# Patient Record
Sex: Male | Born: 1950 | Race: White | Hispanic: No | Marital: Married | Smoking: Former smoker
Health system: Southern US, Community
[De-identification: ages and names within clinical notes are randomized; demographics above are authoritative.]

## PROBLEM LIST (undated history)

## (undated) DIAGNOSIS — Q339 Congenital malformation of lung, unspecified: Secondary | ICD-10-CM

## (undated) DIAGNOSIS — E785 Hyperlipidemia, unspecified: Secondary | ICD-10-CM

## (undated) DIAGNOSIS — C801 Malignant (primary) neoplasm, unspecified: Secondary | ICD-10-CM

## (undated) DIAGNOSIS — M1990 Unspecified osteoarthritis, unspecified site: Secondary | ICD-10-CM

## (undated) DIAGNOSIS — Z8719 Personal history of other diseases of the digestive system: Secondary | ICD-10-CM

## (undated) DIAGNOSIS — N400 Enlarged prostate without lower urinary tract symptoms: Secondary | ICD-10-CM

## (undated) DIAGNOSIS — M2550 Pain in unspecified joint: Secondary | ICD-10-CM

## (undated) DIAGNOSIS — Z860100 Personal history of colon polyps, unspecified: Secondary | ICD-10-CM

## (undated) DIAGNOSIS — K219 Gastro-esophageal reflux disease without esophagitis: Secondary | ICD-10-CM

## (undated) DIAGNOSIS — K3184 Gastroparesis: Secondary | ICD-10-CM

## (undated) DIAGNOSIS — Z8711 Personal history of peptic ulcer disease: Secondary | ICD-10-CM

## (undated) DIAGNOSIS — R350 Frequency of micturition: Secondary | ICD-10-CM

## (undated) HISTORY — DX: Hyperlipidemia, unspecified: E78.5

## (undated) HISTORY — DX: Unspecified osteoarthritis, unspecified site: M19.90

## (undated) HISTORY — DX: Congenital malformation of lung, unspecified: Q33.9

## (undated) HISTORY — PX: CHOLECYSTECTOMY: SHX55

## (undated) HISTORY — DX: Malignant (primary) neoplasm, unspecified: C80.1

## (undated) HISTORY — PX: DIAGNOSTIC LAPAROSCOPY: SUR761

## (undated) HISTORY — PX: OTHER SURGICAL HISTORY: SHX169

## (undated) HISTORY — PX: ESOPHAGOGASTRODUODENOSCOPY: SHX1529

## (undated) HISTORY — DX: Gastroparesis: K31.84

## (undated) HISTORY — PX: COLONOSCOPY: SHX174

## (undated) HISTORY — DX: Gastro-esophageal reflux disease without esophagitis: K21.9

## (undated) HISTORY — DX: Benign prostatic hyperplasia without lower urinary tract symptoms: N40.0

## (undated) SURGERY — THORACENTESIS
Laterality: Right

---

## 2000-09-26 ENCOUNTER — Emergency Department (HOSPITAL_COMMUNITY): Admission: EM | Admit: 2000-09-26 | Discharge: 2000-09-26 | Payer: Self-pay | Admitting: Emergency Medicine

## 2007-04-01 ENCOUNTER — Emergency Department (HOSPITAL_COMMUNITY): Admission: EM | Admit: 2007-04-01 | Discharge: 2007-04-01 | Payer: Self-pay | Admitting: Emergency Medicine

## 2011-03-18 LAB — HM COLONOSCOPY

## 2011-03-23 ENCOUNTER — Other Ambulatory Visit (HOSPITAL_COMMUNITY): Payer: Self-pay | Admitting: Gastroenterology

## 2011-03-23 DIAGNOSIS — R112 Nausea with vomiting, unspecified: Secondary | ICD-10-CM

## 2011-04-06 ENCOUNTER — Encounter (HOSPITAL_COMMUNITY)
Admission: RE | Admit: 2011-04-06 | Discharge: 2011-04-06 | Disposition: A | Discharge status: Home / Self Care | Payer: BC Managed Care – PPO | Source: Ambulatory Visit | Attending: Gastroenterology | Admitting: Gastroenterology

## 2011-04-06 ENCOUNTER — Ambulatory Visit: Admission: RE | Admit: 2011-04-06 | Payer: Self-pay | Source: Ambulatory Visit

## 2011-04-06 DIAGNOSIS — R112 Nausea with vomiting, unspecified: Secondary | ICD-10-CM

## 2011-04-06 MED ORDER — TECHNETIUM TC 99M SULFUR COLLOID
2.0000 | Freq: Once | INTRAVENOUS | Status: AC | PRN
  Administered 2011-04-06: 2 via ORAL

## 2012-11-17 LAB — HM COLONOSCOPY

## 2013-07-16 ENCOUNTER — Emergency Department (HOSPITAL_COMMUNITY)
Admission: EM | Admit: 2013-07-16 | Discharge: 2013-07-16 | Disposition: A | Discharge status: Home / Self Care | Payer: BC Managed Care – PPO | Source: Home / Self Care

## 2013-07-16 ENCOUNTER — Encounter (HOSPITAL_COMMUNITY): Payer: Self-pay | Admitting: Emergency Medicine

## 2013-07-16 DIAGNOSIS — R109 Unspecified abdominal pain: Secondary | ICD-10-CM

## 2013-07-16 DIAGNOSIS — R1110 Vomiting, unspecified: Secondary | ICD-10-CM

## 2013-07-16 MED ORDER — ONDANSETRON HCL 4 MG/2ML IJ SOLN
INTRAMUSCULAR | Status: AC
  Filled 2013-07-16: qty 2

## 2013-07-16 MED ORDER — ONDANSETRON HCL 4 MG/2ML IJ SOLN
4.0000 mg | Freq: Once | INTRAMUSCULAR | Status: AC
  Administered 2013-07-16: 4 mg via INTRAVENOUS

## 2013-07-16 MED ORDER — SODIUM CHLORIDE 0.9 % IV SOLN
Freq: Once | INTRAVENOUS | Status: AC
  Administered 2013-07-16: 11:00:00 via INTRAVENOUS

## 2013-07-16 NOTE — ED Provider Notes (Signed)
CSN: [id number]     Arrival date & time 07/16/13  0910 History   None    Chief Complaint  Patient presents with  . Emesis  . Dehydration   (Consider location/radiation/quality/duration/timing/severity/associated sxs/prior Treatment) HPI Comments: Pt with LUQ abd pain on and off for 2.5 years. Has seen GI for this and had endoscopy and colonscopy, abd x-rays. No source of pain has been found.  approx twice a year in this time period pt also gets episodes of vomiting. Current sx c/w previous episodes.  Pt was seen in ER in [place] on 11/8 for same problem. Papers from ER discharge show pt had cbc with diff, complete metabolic panel, lipase, urinalysis and abdominal x-ray series all of which pt and wife report they were told were normal. Pt states received IV fluids in ER and felt better. Reason for visit today is wants IV fluids again.   Patient is a 62 y.o. male presenting with vomiting. The history is provided by the patient.  Emesis Severity:  Severe Duration:  3 days Timing:  Intermittent Number of daily episodes:  Every 2 hours or more Quality:  Bilious material Progression:  Unchanged Chronicity:  Recurrent Relieved by:  Nothing Worsened by:  Liquids Ineffective treatments:  Antiemetics Associated symptoms: abdominal pain and chills   Associated symptoms: no diarrhea and no fever     History reviewed. No pertinent past medical history. History reviewed. No pertinent past surgical history. History reviewed. No pertinent family history. History  Substance Use Topics  . Smoking status: Never Smoker   . Smokeless tobacco: Not on file  . Alcohol Use: Not on file    Review of Systems  Constitutional: Positive for chills. Negative for fever and unexpected weight change.  Gastrointestinal: Positive for nausea, vomiting and abdominal pain. Negative for diarrhea and constipation.  Genitourinary: Negative for dysuria and flank pain.    Allergies  Review of patient's allergies  indicates no known allergies.  Home Medications  No current outpatient prescriptions on file. BP 147/78  Pulse 76  Temp(Src) 97.8 F (36.6 C) (Oral)  Resp 16  SpO2 97% Physical Exam  Constitutional: He appears well-developed and well-nourished.  Appears ill. Overweight.   Cardiovascular: Normal rate and regular rhythm.   Pulmonary/Chest: Effort normal and breath sounds normal.  Abdominal: Soft. Bowel sounds are normal. He exhibits no distension. There is tenderness in the epigastric area and left upper quadrant. There is no rigidity, no rebound and no guarding.    ED Course  Procedures (including critical care time) Labs Review Labs Reviewed - No data to display Imaging Review No results found.  EKG Interpretation     Ventricular Rate:    PR Interval:    QRS Duration:   QT Interval:    QTC Calculation:   R Axis:     Text Interpretation:              MDM   1. Abdominal pain   2. Vomiting    Pt given 1 liter NS IV as requested and zofran 4mg  IV here at [facility].  States feels a little better. No vomiting since 6am this morning. Pt thinks this episode is resolving. Pt given referral to [facility]. Has nausea meds at home.     Cathlyn Parsons, NP 07/16/13 1157

## 2013-07-16 NOTE — ED Notes (Signed)
C/o vomiting and dehydration for Saturday and was taking to ER and was released 3am on Sunday.  Blood pressure was high during that visit in ER During and Since ER visit patient has been vomiting.   Promethazine was taking from a previous provider Denies diarrhea, fever and headache Admits to a little sinus problems

## 2013-07-16 NOTE — ED Provider Notes (Signed)
Medical screening examination/treatment/procedure(s) were performed by non-physician practitioner and as supervising physician I was immediately available for consultation/collaboration.  Leslee Home, M.D.  Reuben Likes, MD 07/16/13 [phone]

## 2013-07-18 ENCOUNTER — Other Ambulatory Visit: Payer: Self-pay | Admitting: Gastroenterology

## 2013-07-18 DIAGNOSIS — R109 Unspecified abdominal pain: Secondary | ICD-10-CM

## 2013-07-18 DIAGNOSIS — R112 Nausea with vomiting, unspecified: Secondary | ICD-10-CM

## 2013-07-19 ENCOUNTER — Ambulatory Visit
Admission: RE | Admit: 2013-07-19 | Discharge: 2013-07-19 | Disposition: A | Discharge status: Home / Self Care | Payer: BC Managed Care – PPO | Source: Ambulatory Visit | Attending: Gastroenterology | Admitting: Gastroenterology

## 2013-07-19 ENCOUNTER — Ambulatory Visit: Admission: RE | Admit: 2013-07-19 | Payer: Self-pay | Source: Ambulatory Visit

## 2013-07-19 DIAGNOSIS — R112 Nausea with vomiting, unspecified: Secondary | ICD-10-CM

## 2013-07-19 DIAGNOSIS — R109 Unspecified abdominal pain: Secondary | ICD-10-CM

## 2013-08-10 ENCOUNTER — Other Ambulatory Visit (HOSPITAL_COMMUNITY): Payer: Self-pay | Admitting: Gastroenterology

## 2013-08-10 DIAGNOSIS — R112 Nausea with vomiting, unspecified: Secondary | ICD-10-CM

## 2013-08-14 ENCOUNTER — Encounter (HOSPITAL_COMMUNITY)
Admission: RE | Admit: 2013-08-14 | Discharge: 2013-08-14 | Disposition: A | Discharge status: Home / Self Care | Payer: BC Managed Care – PPO | Source: Ambulatory Visit | Attending: Gastroenterology | Admitting: Gastroenterology

## 2013-08-14 ENCOUNTER — Ambulatory Visit: Admission: RE | Admit: 2013-08-14 | Payer: Self-pay | Source: Ambulatory Visit

## 2013-08-14 DIAGNOSIS — R112 Nausea with vomiting, unspecified: Secondary | ICD-10-CM

## 2013-08-14 DIAGNOSIS — R109 Unspecified abdominal pain: Secondary | ICD-10-CM | POA: Insufficient documentation

## 2013-08-14 MED ORDER — TECHNETIUM TC 99M MEBROFENIN IV KIT
5.0000 | PACK | Freq: Once | INTRAVENOUS | Status: AC | PRN
  Administered 2013-08-14: 5 via INTRAVENOUS

## 2013-08-27 ENCOUNTER — Ambulatory Visit (INDEPENDENT_AMBULATORY_CARE_PROVIDER_SITE_OTHER): Payer: BC Managed Care – PPO | Admitting: General Surgery

## 2013-08-28 ENCOUNTER — Encounter (INDEPENDENT_AMBULATORY_CARE_PROVIDER_SITE_OTHER): Payer: Self-pay | Admitting: Surgery

## 2013-09-03 ENCOUNTER — Encounter (INDEPENDENT_AMBULATORY_CARE_PROVIDER_SITE_OTHER): Payer: Self-pay | Admitting: Surgery

## 2013-09-03 ENCOUNTER — Ambulatory Visit (INDEPENDENT_AMBULATORY_CARE_PROVIDER_SITE_OTHER): Payer: BC Managed Care – PPO | Admitting: Surgery

## 2013-09-03 VITALS — BP 124/80 | HR 77 | Temp 98.8°F | Resp 16 | Ht 68.0 in | Wt 220.2 lb

## 2013-09-03 DIAGNOSIS — K828 Other specified diseases of gallbladder: Secondary | ICD-10-CM

## 2013-09-03 NOTE — Progress Notes (Signed)
Patient ID: Guy Evans, male   DOB: 04-07-1951, 62 y.o.   MRN: [id number]  Chief Complaint  Patient presents with  . Abdominal Pain    HPI Guy Evans is a 62 y.o. male.   HPI This is a very pleasant gentleman referred to me by Dr. Willis Modena. For several years, he has been having attacks of nausea and vomiting. He will have epigastric pain and fullness. This occurs after fatty meals and other things he including leafy vegetables. His last attack was 4 weeks ago. He does feel like he may be slightly improving. When he does have these vomiting attacks, however, they are quite severe. His bowel movements are normal. Past Medical History  Diagnosis Date  . Ulcer   . Enlarged prostate   . Gastroparesis   . GERD (gastroesophageal reflux disease)   . Adenomatous colon polyp     Past Surgical History  Procedure Laterality Date  . Colonoscopy    . Esophagogastroduodenoscopy      History reviewed. No pertinent family history.  Social History History  Substance Use Topics  . Smoking status: Former Games developer  . Smokeless tobacco: Not on file  . Alcohol Use: Yes     Comment: daily wine,liquor,beer    No Known Allergies  Current Outpatient Prescriptions  Medication Sig Dispense Refill  . terazosin (HYTRIN) 5 MG capsule Take 5 mg by mouth at bedtime.      Marland Kitchen testosterone (ANDROGEL) 50 MG/5GM GEL Place 5 g onto the skin daily.       No current facility-administered medications for this visit.    Review of Systems Review of Systems  Constitutional: Negative for fever, chills and unexpected weight change.  HENT: Negative for congestion, hearing loss, sore throat, trouble swallowing and voice change.   Eyes: Negative for visual disturbance.  Respiratory: Negative for cough and wheezing.   Cardiovascular: Negative for chest pain, palpitations and leg swelling.  Gastrointestinal: Positive for nausea, vomiting, abdominal pain and abdominal distention. Negative for diarrhea,  constipation, blood in stool, anal bleeding and rectal pain.  Genitourinary: Negative for hematuria and difficulty urinating.  Musculoskeletal: Negative for arthralgias.  Skin: Negative for rash and wound.  Neurological: Negative for seizures, syncope, weakness and headaches.  Hematological: Negative for adenopathy. Does not bruise/bleed easily.  Psychiatric/Behavioral: Negative for confusion.    Blood pressure 124/80, pulse 77, temperature 98.8 F (37.1 C), temperature source Temporal, resp. rate 16, height 5\' 8"  (1.727 m), weight 220 lb 3.2 oz (99.882 kg).  Physical Exam Physical Exam  Constitutional: He is oriented to person, place, and time. He appears well-developed and well-nourished. No distress.  HENT:  Head: Normocephalic and atraumatic.  Right Ear: External ear normal.  Left Ear: External ear normal.  Nose: Nose normal.  Mouth/Throat: Oropharynx is clear and moist. No oropharyngeal exudate.  Eyes: Conjunctivae are normal. Pupils are equal, round, and reactive to light. Right eye exhibits no discharge. Left eye exhibits no discharge. No scleral icterus.  Neck: Normal range of motion. Neck supple. No tracheal deviation present.  Cardiovascular: Normal rate, normal heart sounds and intact distal pulses.   No murmur heard. Pulmonary/Chest: Effort normal and breath sounds normal. No respiratory distress. He has no wheezes.  Abdominal: Soft. Bowel sounds are normal. He exhibits no distension. There is no tenderness. There is no guarding.  Rectus diastases is present  Musculoskeletal: Normal range of motion. He exhibits no edema and no tenderness.  Lymphadenopathy:    He has no cervical adenopathy.  Neurological: He  is oriented to person, place, and time.  Skin: Skin is dry. No rash noted. He is not diaphoretic. No erythema.  Psychiatric: His behavior is normal. Judgment normal.    Data Reviewed I reviewed his x-ray data and endoscopy reports  Assessment    Biliary  dyskinesia     Plan    I do suspect he has biliary dyskinesia and possible mild chronic cholecystitis.   I discussed the diagnosis with him in detail. I gave him literature regarding gallbladder. I discussed laparoscopic cholecystectomy with him in detail. I discussed the risks of surgery which includes but is not limited to bleeding, infection, injury to surrounding structures, leak, need to convert to an open procedure, and the chance this may not resolve any of his symptoms. I also discussed postoperative recovery. I also discussed continued conservative management. He understands and will discuss this with his wife prior to scheduling surgery       Milee Qualls A 09/03/2013, 11:16 AM

## 2013-09-03 NOTE — Addendum Note (Signed)
Addended by: Abigail Miyamoto A on: 09/03/2013 11:22 AM   Modules accepted: Orders

## 2013-09-04 ENCOUNTER — Encounter (INDEPENDENT_AMBULATORY_CARE_PROVIDER_SITE_OTHER): Payer: Self-pay

## 2013-11-06 ENCOUNTER — Encounter (HOSPITAL_COMMUNITY): Payer: Self-pay | Admitting: Pharmacist

## 2013-11-12 ENCOUNTER — Encounter (HOSPITAL_COMMUNITY)
Admission: RE | Admit: 2013-11-12 | Discharge: 2013-11-12 | Disposition: A | Discharge status: Home / Self Care | Payer: BC Managed Care – PPO | Source: Ambulatory Visit | Attending: Surgery | Admitting: Surgery

## 2013-11-12 ENCOUNTER — Encounter (HOSPITAL_COMMUNITY): Payer: Self-pay

## 2013-11-12 DIAGNOSIS — Z01812 Encounter for preprocedural laboratory examination: Secondary | ICD-10-CM | POA: Insufficient documentation

## 2013-11-12 HISTORY — DX: Pain in unspecified joint: M25.50

## 2013-11-12 HISTORY — DX: Personal history of colonic polyps: Z86.010

## 2013-11-12 HISTORY — DX: Personal history of colon polyps, unspecified: Z86.0100

## 2013-11-12 HISTORY — DX: Frequency of micturition: R35.0

## 2013-11-12 HISTORY — DX: Personal history of other diseases of the digestive system: Z87.19

## 2013-11-12 HISTORY — DX: Personal history of peptic ulcer disease: Z87.11

## 2013-11-12 LAB — BASIC METABOLIC PANEL
BUN: 14 mg/dL (ref 6–23)
CO2: 28 mEq/L (ref 19–32)
Calcium: 9.6 mg/dL (ref 8.4–10.5)
Chloride: 101 mEq/L (ref 96–112)
Creatinine, Ser: 0.92 mg/dL (ref 0.50–1.35)
GFR calc Af Amer: >90 mL/min (ref >90)
GFR calc non Af Amer: 88 mL/min — ABNORMAL LOW (ref >90)
Glucose, Bld: 108 mg/dL — ABNORMAL HIGH (ref 70–99)
Potassium: 3.9 mEq/L (ref 3.7–5.3)
Sodium: 140 mEq/L (ref 137–147)

## 2013-11-12 LAB — CBC
HCT: 41.7 % (ref 39.0–52.0)
Hemoglobin: 14.8 g/dL (ref 13.0–17.0)
MCH: 31.9 pg (ref 26.0–34.0)
MCHC: 35.5 g/dL (ref 30.0–36.0)
MCV: 89.9 fL (ref 78.0–100.0)
Platelets: 206 10*3/uL (ref 150–400)
RBC: 4.64 MIL/uL (ref 4.22–5.81)
RDW: 13.1 % (ref 11.5–15.5)
WBC: 9.1 10*3/uL (ref 4.0–10.5)

## 2013-11-12 NOTE — Progress Notes (Addendum)
Pt doesn't have a cardiologist  Denies ever having an echo/stress test/heart cath   Moody NP-[facility]   Denies EKG or CXR in past yr

## 2013-11-12 NOTE — Pre-Procedure Instructions (Signed)
Guy Evans  11/12/2013   Your procedure is scheduled on:  Mon, Mar 16 @ 7:30 AM  Report to [facility]  at 5:30 AM.  Call this number if you have problems the morning of surgery: [phone]   Remember:   Do not eat food or drink liquids after midnight.                 No Goody's,BC's,Aleve,Aspirin,Ibuprofen,Fish Oil,or any Herbal Medications   Do not wear jewelry  Do not wear lotions, powders, or colognes. You may wear deodorant.  Men may shave face and neck.  Do not bring valuables to the hospital.  [facility] is not responsible                  for any belongings or valuables.               Contacts, dentures or bridgework may not be worn into surgery.  Leave suitcase in the car. After surgery it may be brought to your room.  For patients admitted to the hospital, discharge time is determined by your                treatment team.               Patients discharged the day of surgery will not be allowed to drive  home.    Special Instructions:  [facility] - Preparing for Surgery  Before surgery, you can play an important role.  Because skin is not sterile, your skin needs to be as free of germs as possible.  You can reduce the number of germs on you skin by washing with CHG (chlorahexidine gluconate) soap before surgery.  CHG is an antiseptic cleaner which kills germs and bonds with the skin to continue killing germs even after washing.  Please DO NOT use if you have an allergy to CHG or antibacterial soaps.  If your skin becomes reddened/irritated stop using the CHG and inform your nurse when you arrive at Short Stay.  Do not shave (including legs and underarms) for at least 48 hours prior to the first CHG shower.  You may shave your face.  Please follow these instructions carefully:   1.  Shower with CHG Soap the night before surgery and the                                morning of Surgery.  2.  If you choose to wash your hair, wash your hair first as  usual with your       normal shampoo.  3.  After you shampoo, rinse your hair and body thoroughly to remove the                      Shampoo.  4.  Use CHG as you would any other liquid soap.  You can apply chg directly       to the skin and wash gently with scrungie or a clean washcloth.  5.  Apply the CHG Soap to your body ONLY FROM THE NECK DOWN.        Do not use on open wounds or open sores.  Avoid contact with your eyes,       ears, mouth and genitals (private parts).  Wash genitals (private parts)       with your normal soap.  6.  Wash thoroughly,  paying special attention to the area where your surgery        will be performed.  7.  Thoroughly rinse your body with warm water from the neck down.  8.  DO NOT shower/wash with your normal soap after using and rinsing off       the CHG Soap.  9.  Pat yourself dry with a clean towel.            10.  Wear clean pajamas.            11.  Place clean sheets on your bed the night of your first shower and do not        sleep with pets.  Day of Surgery  Do not apply any lotions/deoderants the morning of surgery.  Please wear clean clothes to the hospital/surgery center.     Please read over the following fact sheets that you were given: Pain Booklet, Coughing and Deep Breathing and Surgical Site Infection Prevention

## 2013-11-18 MED ORDER — CEFAZOLIN SODIUM-DEXTROSE 2-3 GM-% IV SOLR
2.0000 g | INTRAVENOUS | Status: DC
  Filled 2013-11-18: qty 50

## 2013-11-18 NOTE — H&P (Signed)
Chief Complaint   Patient presents with   .  Abdominal Pain   HPI  Guy Evans is a 63 y.o. male.  HPI  This is a very pleasant gentleman referred to me by Dr. Arta Silence. For several years, he has been having attacks of nausea and vomiting. He will have epigastric pain and fullness. This occurs after fatty meals and other things he including leafy vegetables. His last attack was 4 weeks ago. He does feel like he may be slightly improving. When he does have these vomiting attacks, however, they are quite severe. His bowel movements are normal.  Past Medical History   Diagnosis  Date   .  Ulcer    .  Enlarged prostate    .  Gastroparesis    .  GERD (gastroesophageal reflux disease)    .  Adenomatous colon polyp     Past Surgical History   Procedure  Laterality  Date   .  Colonoscopy     .  Esophagogastroduodenoscopy     History reviewed. No pertinent family history.  Social History  History   Substance Use Topics   .  Smoking status:  Former Research scientist (life sciences)   .  Smokeless tobacco:  Not on file   .  Alcohol Use:  Yes      Comment: daily wine,liquor,beer   No Known Allergies  Current Outpatient Prescriptions   Medication  Sig  Dispense  Refill   .  terazosin (HYTRIN) 5 MG capsule  Take 5 mg by mouth at bedtime.     Marland Kitchen  testosterone (ANDROGEL) 50 MG/5GM GEL  Place 5 g onto the skin daily.      No current facility-administered medications for this visit.   Review of Systems  Review of Systems  Constitutional: Negative for fever, chills and unexpected weight change.  HENT: Negative for congestion, hearing loss, sore throat, trouble swallowing and voice change.  Eyes: Negative for visual disturbance.  Respiratory: Negative for cough and wheezing.  Cardiovascular: Negative for chest pain, palpitations and leg swelling.  Gastrointestinal: Positive for nausea, vomiting, abdominal pain and abdominal distention. Negative for diarrhea, constipation, blood in stool, anal bleeding and rectal  pain.  Genitourinary: Negative for hematuria and difficulty urinating.  Musculoskeletal: Negative for arthralgias.  Skin: Negative for rash and wound.  Neurological: Negative for seizures, syncope, weakness and headaches.  Hematological: Negative for adenopathy. Does not bruise/bleed easily.  Psychiatric/Behavioral: Negative for confusion.  Blood pressure 124/80, pulse 77, temperature 98.8 F (37.1 C), temperature source Temporal, resp. rate 16, height 5\' 8"  (1.727 m), weight 220 lb 3.2 oz (99.882 kg).  Physical Exam  Physical Exam  Constitutional: He is oriented to person, place, and time. He appears well-developed and well-nourished. No distress.  HENT:  Head: Normocephalic and atraumatic.  Right Ear: External ear normal.  Left Ear: External ear normal.  Nose: Nose normal.  Mouth/Throat: Oropharynx is clear and moist. No oropharyngeal exudate.  Eyes: Conjunctivae are normal. Pupils are equal, round, and reactive to light. Right eye exhibits no discharge. Left eye exhibits no discharge. No scleral icterus.  Neck: Normal range of motion. Neck supple. No tracheal deviation present.  Cardiovascular: Normal rate, normal heart sounds and intact distal pulses.  No murmur heard.  Pulmonary/Chest: Effort normal and breath sounds normal. No respiratory distress. He has no wheezes.  Abdominal: Soft. Bowel sounds are normal. He exhibits no distension. There is no tenderness. There is no guarding.  Rectus diastases is present  Musculoskeletal: Normal range of  motion. He exhibits no edema and no tenderness.  Lymphadenopathy:  He has no cervical adenopathy.  Neurological: He is oriented to person, place, and time.  Skin: Skin is dry. No rash noted. He is not diaphoretic. No erythema.  Psychiatric: His behavior is normal. Judgment normal.  Data Reviewed  I reviewed his x-ray data and endoscopy reports  Assessment  Biliary dyskinesia  Plan  I do suspect he has biliary dyskinesia and possible  mild chronic cholecystitis. I discussed the diagnosis with him in detail. I gave him literature regarding gallbladder. I discussed laparoscopic cholecystectomy with him in detail. I discussed the risks of surgery which includes but is not limited to bleeding, infection, injury to surrounding structures, leak, need to convert to an open procedure, and the chance this may not resolve any of his symptoms. I also discussed postoperative recovery. I also discussed continued conservative management. He understands and will discuss this with his wife prior to scheduling surgery

## 2013-11-19 ENCOUNTER — Ambulatory Visit (HOSPITAL_COMMUNITY): Payer: BC Managed Care – PPO | Admitting: Anesthesiology

## 2013-11-19 ENCOUNTER — Encounter (HOSPITAL_COMMUNITY): Payer: Self-pay | Admitting: *Deleted

## 2013-11-19 ENCOUNTER — Ambulatory Visit (HOSPITAL_COMMUNITY)
Admission: RE | Admit: 2013-11-19 | Discharge: 2013-11-19 | Disposition: A | Discharge status: Home / Self Care | Payer: BC Managed Care – PPO | Source: Ambulatory Visit | Attending: Surgery | Admitting: Surgery

## 2013-11-19 ENCOUNTER — Encounter (HOSPITAL_COMMUNITY)
Admission: RE | Disposition: A | Discharge status: Home / Self Care | Payer: Self-pay | Source: Ambulatory Visit | Attending: Surgery

## 2013-11-19 ENCOUNTER — Encounter (HOSPITAL_COMMUNITY): Payer: BC Managed Care – PPO | Admitting: Anesthesiology

## 2013-11-19 DIAGNOSIS — N400 Enlarged prostate without lower urinary tract symptoms: Secondary | ICD-10-CM | POA: Insufficient documentation

## 2013-11-19 DIAGNOSIS — Z87891 Personal history of nicotine dependence: Secondary | ICD-10-CM | POA: Insufficient documentation

## 2013-11-19 DIAGNOSIS — K811 Chronic cholecystitis: Secondary | ICD-10-CM

## 2013-11-19 DIAGNOSIS — K3184 Gastroparesis: Secondary | ICD-10-CM | POA: Insufficient documentation

## 2013-11-19 DIAGNOSIS — I509 Heart failure, unspecified: Secondary | ICD-10-CM | POA: Insufficient documentation

## 2013-11-19 DIAGNOSIS — I2510 Atherosclerotic heart disease of native coronary artery without angina pectoris: Secondary | ICD-10-CM | POA: Insufficient documentation

## 2013-11-19 DIAGNOSIS — I209 Angina pectoris, unspecified: Secondary | ICD-10-CM | POA: Insufficient documentation

## 2013-11-19 DIAGNOSIS — K219 Gastro-esophageal reflux disease without esophagitis: Secondary | ICD-10-CM | POA: Insufficient documentation

## 2013-11-19 HISTORY — PX: CHOLECYSTECTOMY: SHX55

## 2013-11-19 SURGERY — LAPAROSCOPIC CHOLECYSTECTOMY
Anesthesia: General | Site: Abdomen

## 2013-11-19 MED ORDER — 0.9 % SODIUM CHLORIDE (POUR BTL) OPTIME
TOPICAL | Status: DC | PRN
  Administered 2013-11-19: 1000 mL

## 2013-11-19 MED ORDER — FENTANYL CITRATE 0.05 MG/ML IJ SOLN
INTRAMUSCULAR | Status: AC
  Filled 2013-11-19: qty 5

## 2013-11-19 MED ORDER — FENTANYL CITRATE 0.05 MG/ML IJ SOLN
INTRAMUSCULAR | Status: AC
  Filled 2013-11-19: qty 2

## 2013-11-19 MED ORDER — PHENYLEPHRINE 40 MCG/ML (10ML) SYRINGE FOR IV PUSH (FOR BLOOD PRESSURE SUPPORT)
PREFILLED_SYRINGE | INTRAVENOUS | Status: AC
  Filled 2013-11-19: qty 10

## 2013-11-19 MED ORDER — OXYCODONE HCL 5 MG PO TABS
ORAL_TABLET | ORAL | Status: AC
  Filled 2013-11-19: qty 1

## 2013-11-19 MED ORDER — SODIUM CHLORIDE 0.9 % IJ SOLN: 3.0000 mL | INTRAMUSCULAR | Status: DC | PRN

## 2013-11-19 MED ORDER — BUPIVACAINE-EPINEPHRINE 0.25% -1:200000 IJ SOLN
INTRAMUSCULAR | Status: DC | PRN
  Administered 2013-11-19: 20 mL

## 2013-11-19 MED ORDER — GLYCOPYRROLATE 0.2 MG/ML IJ SOLN
INTRAMUSCULAR | Status: AC
  Filled 2013-11-19: qty 2

## 2013-11-19 MED ORDER — ROCURONIUM BROMIDE 50 MG/5ML IV SOLN
INTRAVENOUS | Status: AC
  Filled 2013-11-19: qty 1

## 2013-11-19 MED ORDER — NEOSTIGMINE METHYLSULFATE 1 MG/ML IJ SOLN
INTRAMUSCULAR | Status: DC | PRN
  Administered 2013-11-19: 3 mg via INTRAVENOUS

## 2013-11-19 MED ORDER — MORPHINE SULFATE 2 MG/ML IJ SOLN
INTRAMUSCULAR | Status: AC
  Filled 2013-11-19: qty 2

## 2013-11-19 MED ORDER — LIDOCAINE HCL (CARDIAC) 20 MG/ML IV SOLN
INTRAVENOUS | Status: AC
  Filled 2013-11-19: qty 5

## 2013-11-19 MED ORDER — ACETAMINOPHEN 650 MG RE SUPP: 650.0000 mg | RECTAL | Status: DC | PRN

## 2013-11-19 MED ORDER — SODIUM CHLORIDE 0.9 % IJ SOLN: 3.0000 mL | Freq: Two times a day (BID) | INTRAMUSCULAR | Status: DC

## 2013-11-19 MED ORDER — OXYCODONE HCL 5 MG/5ML PO SOLN: 5.0000 mg | Freq: Once | ORAL | Status: AC | PRN

## 2013-11-19 MED ORDER — MIDAZOLAM HCL 5 MG/5ML IJ SOLN
INTRAMUSCULAR | Status: DC | PRN
Start: 2013-11-19 — End: 2013-11-19
  Administered 2013-11-19: 2 mg via INTRAVENOUS

## 2013-11-19 MED ORDER — KETOROLAC TROMETHAMINE 30 MG/ML IJ SOLN: 15.0000 mg | Freq: Once | INTRAMUSCULAR | Status: DC | PRN

## 2013-11-19 MED ORDER — KETOROLAC TROMETHAMINE 30 MG/ML IJ SOLN
30.0000 mg | Freq: Once | INTRAMUSCULAR | Status: DC
  Filled 2013-11-19: qty 1

## 2013-11-19 MED ORDER — PHENYLEPHRINE HCL 10 MG/ML IJ SOLN
INTRAMUSCULAR | Status: DC | PRN
  Administered 2013-11-19: 80 ug via INTRAVENOUS

## 2013-11-19 MED ORDER — ONDANSETRON HCL 4 MG/2ML IJ SOLN
INTRAMUSCULAR | Status: AC
  Filled 2013-11-19: qty 2

## 2013-11-19 MED ORDER — ONDANSETRON HCL 4 MG/2ML IJ SOLN: 4.0000 mg | Freq: Four times a day (QID) | INTRAMUSCULAR | Status: DC | PRN

## 2013-11-19 MED ORDER — OXYCODONE HCL 5 MG PO TABS: 5.0000 mg | ORAL_TABLET | ORAL | Status: DC | PRN

## 2013-11-19 MED ORDER — PROPOFOL 10 MG/ML IV BOLUS
INTRAVENOUS | Status: AC
  Filled 2013-11-19: qty 20

## 2013-11-19 MED ORDER — NEOSTIGMINE METHYLSULFATE 1 MG/ML IJ SOLN
INTRAMUSCULAR | Status: AC
  Filled 2013-11-19: qty 10

## 2013-11-19 MED ORDER — ONDANSETRON HCL 4 MG/2ML IJ SOLN
INTRAMUSCULAR | Status: DC | PRN
  Administered 2013-11-19: 4 mg via INTRAVENOUS

## 2013-11-19 MED ORDER — SUCCINYLCHOLINE CHLORIDE 20 MG/ML IJ SOLN
INTRAMUSCULAR | Status: DC | PRN
  Administered 2013-11-19: 60 mg via INTRAVENOUS

## 2013-11-19 MED ORDER — BUPIVACAINE-EPINEPHRINE (PF) 0.25% -1:200000 IJ SOLN
INTRAMUSCULAR | Status: AC
Start: 2013-11-19 — End: 2013-11-19
  Filled 2013-11-19: qty 30

## 2013-11-19 MED ORDER — ROCURONIUM BROMIDE 100 MG/10ML IV SOLN
INTRAVENOUS | Status: DC | PRN
  Administered 2013-11-19: 25 mg via INTRAVENOUS

## 2013-11-19 MED ORDER — ACETAMINOPHEN 325 MG PO TABS: 325.0000 mg | ORAL_TABLET | ORAL | Status: DC | PRN

## 2013-11-19 MED ORDER — SODIUM CHLORIDE 0.9 % IR SOLN
Status: DC | PRN
  Administered 2013-11-19: 1000 mL

## 2013-11-19 MED ORDER — FENTANYL CITRATE 0.05 MG/ML IJ SOLN
INTRAMUSCULAR | Status: DC | PRN
  Administered 2013-11-19: 50 ug via INTRAVENOUS
  Administered 2013-11-19: 100 ug via INTRAVENOUS
  Administered 2013-11-19 (×2): 50 ug via INTRAVENOUS

## 2013-11-19 MED ORDER — OXYCODONE HCL 5 MG PO TABS
5.0000 mg | ORAL_TABLET | Freq: Once | ORAL | Status: AC | PRN
  Administered 2013-11-19: 5 mg via ORAL

## 2013-11-19 MED ORDER — PROPOFOL 10 MG/ML IV BOLUS
INTRAVENOUS | Status: DC | PRN
  Administered 2013-11-19: 200 mg via INTRAVENOUS

## 2013-11-19 MED ORDER — HYDROCODONE-ACETAMINOPHEN 5-325 MG PO TABS: 1.0000 | ORAL_TABLET | Freq: Four times a day (QID) | ORAL | Status: DC | PRN

## 2013-11-19 MED ORDER — LIDOCAINE HCL (CARDIAC) 20 MG/ML IV SOLN
INTRAVENOUS | Status: DC | PRN
  Administered 2013-11-19: 80 mg via INTRAVENOUS

## 2013-11-19 MED ORDER — LACTATED RINGERS IV SOLN
INTRAVENOUS | Status: DC | PRN
  Administered 2013-11-19 (×2): via INTRAVENOUS

## 2013-11-19 MED ORDER — LIDOCAINE HCL (CARDIAC) 20 MG/ML IV SOLN
INTRAVENOUS | Status: AC
Start: 2013-11-19 — End: 2013-11-19
  Filled 2013-11-19: qty 5

## 2013-11-19 MED ORDER — MORPHINE SULFATE 4 MG/ML IJ SOLN
4.0000 mg | INTRAMUSCULAR | Status: DC | PRN
  Administered 2013-11-19: 4 mg via INTRAVENOUS

## 2013-11-19 MED ORDER — SUCCINYLCHOLINE CHLORIDE 20 MG/ML IJ SOLN
INTRAMUSCULAR | Status: AC
  Filled 2013-11-19: qty 1

## 2013-11-19 MED ORDER — FENTANYL CITRATE 0.05 MG/ML IJ SOLN
25.0000 ug | INTRAMUSCULAR | Status: DC | PRN
  Administered 2013-11-19 (×2): 50 ug via INTRAVENOUS

## 2013-11-19 MED ORDER — ACETAMINOPHEN 160 MG/5ML PO SOLN
325.0000 mg | ORAL | Status: DC | PRN
  Filled 2013-11-19: qty 20.3

## 2013-11-19 MED ORDER — ACETAMINOPHEN 325 MG PO TABS: 650.0000 mg | ORAL_TABLET | ORAL | Status: DC | PRN

## 2013-11-19 MED ORDER — SODIUM CHLORIDE 0.9 % IV SOLN: 250.0000 mL | INTRAVENOUS | Status: DC | PRN

## 2013-11-19 MED ORDER — MIDAZOLAM HCL 2 MG/2ML IJ SOLN
INTRAMUSCULAR | Status: AC
  Filled 2013-11-19: qty 2

## 2013-11-19 MED ORDER — ONDANSETRON HCL 4 MG/2ML IJ SOLN
4.0000 mg | Freq: Once | INTRAMUSCULAR | Status: AC | PRN
  Administered 2013-11-19: 4 mg via INTRAVENOUS

## 2013-11-19 MED ORDER — KETOROLAC TROMETHAMINE 30 MG/ML IJ SOLN
INTRAMUSCULAR | Status: AC
  Filled 2013-11-19: qty 1

## 2013-11-19 MED ORDER — GLYCOPYRROLATE 0.2 MG/ML IJ SOLN
INTRAMUSCULAR | Status: DC | PRN
  Administered 2013-11-19: 0.4 mg via INTRAVENOUS

## 2013-11-19 SURGICAL SUPPLY — 46 items
APL SKNCLS STERI-STRIP NONHPOA (GAUZE/BANDAGES/DRESSINGS) ×1
APPLIER CLIP 5 13 M/L LIGAMAX5 (MISCELLANEOUS) ×2
APR CLP MED LRG 5 ANG JAW (MISCELLANEOUS) ×1
BAG SPEC RTRVL LRG 6X4 10 (ENDOMECHANICALS) ×1
BANDAGE ADHESIVE 1X3 (GAUZE/BANDAGES/DRESSINGS) ×8 IMPLANT
BENZOIN TINCTURE PRP APPL 2/3 (GAUZE/BANDAGES/DRESSINGS) ×2 IMPLANT
BLADE SURG ROTATE 9660 (MISCELLANEOUS) ×1 IMPLANT
CANISTER SUCTION 2500CC (MISCELLANEOUS) ×2 IMPLANT
CHLORAPREP W/TINT 26ML (MISCELLANEOUS) ×2 IMPLANT
CLIP APPLIE 5 13 M/L LIGAMAX5 (MISCELLANEOUS) ×1 IMPLANT
COVER MAYO STAND STRL (DRAPES) IMPLANT
COVER SURGICAL LIGHT HANDLE (MISCELLANEOUS) ×2 IMPLANT
DECANTER SPIKE VIAL GLASS SM (MISCELLANEOUS) IMPLANT
DRAPE C-ARM 42X72 X-RAY (DRAPES) IMPLANT
DRAPE UTILITY 15X26 W/TAPE STR (DRAPE) ×4 IMPLANT
ELECT REM PT RETURN 9FT ADLT (ELECTROSURGICAL) ×2
ELECTRODE REM PT RTRN 9FT ADLT (ELECTROSURGICAL) ×1 IMPLANT
GLOVE BIOGEL PI IND STRL 7.0 (GLOVE) IMPLANT
GLOVE BIOGEL PI IND STRL 7.5 (GLOVE) ×1 IMPLANT
GLOVE BIOGEL PI INDICATOR 7.0 (GLOVE) ×2
GLOVE BIOGEL PI INDICATOR 7.5 (GLOVE) ×1
GLOVE ECLIPSE 7.5 STRL STRAW (GLOVE) ×1 IMPLANT
GLOVE SURG SIGNA 7.5 PF LTX (GLOVE) ×2 IMPLANT
GLOVE SURG SS PI 7.0 STRL IVOR (GLOVE) ×1 IMPLANT
GOWN STRL REUS W/ TWL LRG LVL3 (GOWN DISPOSABLE) ×3 IMPLANT
GOWN STRL REUS W/ TWL XL LVL3 (GOWN DISPOSABLE) ×1 IMPLANT
GOWN STRL REUS W/TWL LRG LVL3 (GOWN DISPOSABLE) ×4
GOWN STRL REUS W/TWL XL LVL3 (GOWN DISPOSABLE) ×2
KIT BASIN OR (CUSTOM PROCEDURE TRAY) ×2 IMPLANT
KIT ROOM TURNOVER OR (KITS) ×2 IMPLANT
NS IRRIG 1000ML POUR BTL (IV SOLUTION) ×2 IMPLANT
PAD ARMBOARD 7.5X6 YLW CONV (MISCELLANEOUS) ×2 IMPLANT
POUCH SPECIMEN RETRIEVAL 10MM (ENDOMECHANICALS) ×1 IMPLANT
SCISSORS LAP 5X35 DISP (ENDOMECHANICALS) ×2 IMPLANT
SET CHOLANGIOGRAPH 5 50 .035 (SET/KITS/TRAYS/PACK) IMPLANT
SET IRRIG TUBING LAPAROSCOPIC (IRRIGATION / IRRIGATOR) ×2 IMPLANT
SLEEVE ENDOPATH XCEL 5M (ENDOMECHANICALS) ×4 IMPLANT
SPECIMEN JAR SMALL (MISCELLANEOUS) ×2 IMPLANT
STRIP CLOSURE SKIN 1/2X4 (GAUZE/BANDAGES/DRESSINGS) ×1 IMPLANT
SUT MON AB 4-0 PC3 18 (SUTURE) ×2 IMPLANT
TOWEL OR 17X24 6PK STRL BLUE (TOWEL DISPOSABLE) ×2 IMPLANT
TOWEL OR 17X26 10 PK STRL BLUE (TOWEL DISPOSABLE) ×2 IMPLANT
TRAY LAPAROSCOPIC (CUSTOM PROCEDURE TRAY) ×2 IMPLANT
TROCAR XCEL BLUNT TIP 100MML (ENDOMECHANICALS) ×2 IMPLANT
TROCAR XCEL NON-BLD 5MMX100MML (ENDOMECHANICALS) ×2 IMPLANT
WATER STERILE IRR 1000ML POUR (IV SOLUTION) IMPLANT

## 2013-11-19 NOTE — Transfer of Care (Signed)
Immediate Anesthesia Transfer of Care Note  Patient: Guy Evans  Procedure(s) Performed: Procedure(s): LAPAROSCOPIC CHOLECYSTECTOMY (N/A)  Patient Location: PACU  Anesthesia Type:General  Level of Consciousness: awake and alert   Airway & Oxygen Therapy: Patient Spontanous Breathing and Patient connected to nasal cannula oxygen  Post-op Assessment: Report given to PACU RN and Post -op Vital signs reviewed and stable  Post vital signs: Reviewed and stable  Complications: No apparent anesthesia complications

## 2013-11-19 NOTE — Op Note (Signed)

## 2013-11-19 NOTE — Progress Notes (Signed)
Pt states that he feels bad, he states that he got worse after using the CHG bath, but is unsure if that is what made him feel so bad.

## 2013-11-19 NOTE — Discharge Instructions (Signed)
[facility] ______[facility] LAPAROSCOPIC SURGERY: POST OP INSTRUCTIONS Always review your discharge instruction sheet given to you by the facility where your surgery was performed. IF YOU HAVE DISABILITY OR FAMILY LEAVE FORMS, YOU MUST BRING THEM TO THE OFFICE FOR PROCESSING.   DO NOT GIVE THEM TO YOUR DOCTOR.  1. A prescription for pain medication may be given to you upon discharge.  Take your pain medication as prescribed, if needed.  If narcotic pain medicine is not needed, then you may take acetaminophen (Tylenol) or ibuprofen (Advil) as needed. 2. Take your usually prescribed medications unless otherwise directed. 3. If you need a refill on your pain medication, please contact your pharmacy.  They will contact our office to request authorization. Prescriptions will not be filled after 5pm or on week-ends. 4. You should follow a light diet the first few days after arrival home, such as soup and crackers, etc.  Be sure to include lots of fluids daily. 5. Most patients will experience some swelling and bruising in the area of the incisions.  Ice packs will help.  Swelling and bruising can take several days to resolve.  6. It is common to experience some constipation if taking pain medication after surgery.  Increasing fluid intake and taking a stool softener (such as Colace) will usually help or prevent this problem from occurring.  A mild laxative (Milk of Magnesia or Miralax) should be taken according to package instructions if there are no bowel movements after 48 hours. 7. Unless discharge instructions indicate otherwise, you may remove your bandages 24-48 hours after surgery, and you may shower at that time.  You may have steri-strips (small skin tapes) in place directly over the incision.  These strips should be left on the skin for 7-10 days.  If your surgeon used skin glue on the incision, you may shower in 24 hours.  The glue will flake off over the next 2-3 weeks.  Any sutures  or staples will be removed at the office during your follow-up visit. 8. ACTIVITIES:  You may resume regular (light) daily activities beginning the next day--such as daily self-care, walking, climbing stairs--gradually increasing activities as tolerated.  You may have sexual intercourse when it is comfortable.  Refrain from any heavy lifting or straining until approved by your doctor. a. You may drive when you are no longer taking prescription pain medication, you can comfortably wear a seatbelt, and you can safely maneuver your car and apply brakes. b. RETURN TO WORK:  __________________________________________________________ 9. You should see your doctor in the office for a follow-up appointment approximately 2-3 weeks after your surgery.  Make sure that you call for this appointment within a day or two after you arrive home to insure a convenient appointment time. 10. OTHER INSTRUCTIONS: __NO LIFTING MORE THAN 15 TO 20 POUNDS FOR 2 WEEKS. 11. IBUPROFEN AND ICE PACK ALSO FOR PAIN________________________________________________________________________________________________________________________ __________________________________________________________________________________________________________________________ WHEN TO CALL YOUR DOCTOR: 1. Fever over 101.0 2. Inability to urinate 3. Continued bleeding from incision. 4. Increased pain, redness, or drainage from the incision. 5. Increasing abdominal pain  The clinic staff is available to answer your questions during regular business hours.  Please dont hesitate to call and ask to speak to one of the nurses for clinical concerns.  If you have a medical emergency, go to the nearest emergency room or call 911.  A surgeon from [facility] is always on call at the hospital. [address] [place], [place], [place]  [address] ?  [id number], [place], [place]   [address] [phone] ? [phone] ? FAX [phone] Web  site: [email or web address]  What to eat:  For your first meals, you should eat lightly; only small meals initially.  If you do not have nausea, you may eat larger meals.  Avoid spicy, greasy and heavy food.    General Anesthesia, Adult, Care After  Refer to this sheet in the next few weeks. These instructions provide you with information on caring for yourself after your procedure. Your health care provider may also give you more specific instructions. Your treatment has been planned according to current medical practices, but problems sometimes occur. Call your health care provider if you have any problems or questions after your procedure.  WHAT TO EXPECT AFTER THE PROCEDURE  After the procedure, it is typical to experience:  Sleepiness.  Nausea and vomiting. HOME CARE INSTRUCTIONS  For the first 24 hours after general anesthesia:  Have a responsible person with you.  Do not drive a car. If you are alone, do not take public transportation.  Do not drink alcohol.  Do not take medicine that has not been prescribed by your health care provider.  Do not sign important papers or make important decisions.  You may resume a normal diet and activities as directed by your health care provider.  Change bandages (dressings) as directed.  If you have questions or problems that seem related to general anesthesia, call the hospital and ask for the anesthetist or anesthesiologist on call. SEEK MEDICAL CARE IF:  You have nausea and vomiting that continue the day after anesthesia.  You develop a rash. SEEK IMMEDIATE MEDICAL CARE IF:  You have difficulty breathing.  You have chest pain.  You have any allergic problems. Document Released: 11/29/2000 Document Revised: 04/25/2013 Document Reviewed: 03/08/2013  [facility] Patient Information 2014 [place], [place].

## 2013-11-19 NOTE — Progress Notes (Addendum)
Spoke with Dr. Leda Quail at [phone] and reported pt's dizziness, N/V, not feeling well and taking hytrin for prostate. Pt also sweaty with the N/V. Order obtained for EKG. Dr. Leda Quail notified that EKG was normal.

## 2013-11-19 NOTE — Progress Notes (Signed)
Pt c/o ABD pain increased from 4/10 to 6.5/10. Dr. Ninfa Linden notified, orders received and pt medicated for pain. Will monitor.

## 2013-11-19 NOTE — Progress Notes (Signed)
EKG completed. Pt denies dizziness now except when he sits up.  N/V is better and denies any pain.

## 2013-11-19 NOTE — Preoperative (Signed)
Beta Blockers   Reason not to administer Beta Blockers:Not Applicable 

## 2013-11-19 NOTE — Anesthesia Postprocedure Evaluation (Signed)
  Anesthesia Post-op Note  Patient: Guy Evans  Procedure(s) Performed: Procedure(s): LAPAROSCOPIC CHOLECYSTECTOMY (N/A)  Patient Location: PACU  Anesthesia Type:General  Level of Consciousness: awake, alert  and oriented  Airway and Oxygen Therapy: Patient Spontanous Breathing  Post-op Pain: mild  Post-op Assessment: Post-op Vital signs reviewed, Patient's Cardiovascular Status Stable, Respiratory Function Stable, Patent Airway, No signs of Nausea or vomiting and Pain level controlled  Post-op Vital Signs: Reviewed and stable  Complications: No apparent anesthesia complications

## 2013-11-19 NOTE — Interval H&P Note (Signed)
History and Physical Interval Note: no change in H and P   11/19/2013 7:00 AM  Guy Evans  has presented today for surgery, with the diagnosis of biliary dyskinesia   The various methods of treatment have been discussed with the patient and family. After consideration of risks, benefits and other options for treatment, the patient has consented to  Procedure(s): LAPAROSCOPIC CHOLECYSTECTOMY (N/A) as a surgical intervention .  The patient's history has been reviewed, patient examined, no change in status, stable for surgery.  I have reviewed the patient's chart and labs.  Questions were answered to the patient's satisfaction.     Abbygail [place]hoite A

## 2013-11-19 NOTE — Anesthesia Preprocedure Evaluation (Signed)
Anesthesia Evaluation  Patient identified by MRN, date of birth, ID band Patient awake    Reviewed: Allergy & Precautions, H&P , NPO status , Patient's Chart, lab work & pertinent test results  History of Anesthesia Complications Negative for: history of anesthetic complications  Airway Mallampati: III TM Distance: <3 FB Neck ROM: Full    Dental  (+) Teeth Intact   Pulmonary neg shortness of breath, neg sleep apnea, neg COPDformer smoker,  breath sounds clear to auscultation        Cardiovascular - angina- CAD, - CHF and - DOE negative cardio ROS  Rhythm:Regular Rate:Normal     Neuro/Psych negative neurological ROS     GI/Hepatic Neg liver ROS, Cholecystitis with active nausea   Endo/Other    Renal/GU negative Renal ROS     Musculoskeletal negative musculoskeletal ROS (+)   Abdominal   Peds  Hematology negative hematology ROS (+)   Anesthesia Other Findings   Reproductive/Obstetrics                           Anesthesia Physical Anesthesia Plan  ASA: II  Anesthesia Plan: General   Post-op Pain Management:    Induction: Intravenous  Airway Management Planned: Oral ETT  Additional Equipment: None  Intra-op Plan:   Post-operative Plan:   Informed Consent: I have reviewed the patients History and Physical, chart, labs and discussed the procedure including the risks, benefits and alternatives for the proposed anesthesia with the patient or authorized representative who has indicated his/her understanding and acceptance.   Dental advisory given  Plan Discussed with: CRNA and Surgeon  Anesthesia Plan Comments:         Anesthesia Quick Evaluation

## 2013-11-20 ENCOUNTER — Other Ambulatory Visit (INDEPENDENT_AMBULATORY_CARE_PROVIDER_SITE_OTHER): Payer: Self-pay | Admitting: Surgery

## 2013-11-20 ENCOUNTER — Encounter (HOSPITAL_COMMUNITY): Payer: Self-pay | Admitting: Surgery

## 2013-11-20 ENCOUNTER — Telehealth (INDEPENDENT_AMBULATORY_CARE_PROVIDER_SITE_OTHER): Payer: Self-pay

## 2013-11-20 MED ORDER — OXYCODONE HCL 5 MG/5ML PO SOLN: 5.0000 mg | ORAL | Status: DC | PRN

## 2013-11-20 MED ORDER — ONDANSETRON HCL 4 MG PO TABS: 4.0000 mg | ORAL_TABLET | Freq: Three times a day (TID) | ORAL | Status: DC | PRN

## 2013-11-20 MED FILL — Cefazolin Sodium For Inj 1 GM: INTRAMUSCULAR | Qty: 2 | Status: AC

## 2013-11-20 NOTE — Telephone Encounter (Signed)
Pt's wife notified Rx for liquid Oxycodone and Zofran written by Dr. Ninfa Linden and available at front desk for pick up.

## 2013-11-20 NOTE — Telephone Encounter (Signed)
Pt is s/p lap cholecystectomy on 3/16.  He was given Norco 5/325 for pain.  His wife says he is extremely nauseated after taking it.  He requested a liquid medicine for pain because he cannot keep anything down.  Dr. Ninfa Linden to be made aware and we will call her back.  In the meantime, I recommended he start taking Ibuprofen for any discomfort.

## 2013-11-30 ENCOUNTER — Encounter (INDEPENDENT_AMBULATORY_CARE_PROVIDER_SITE_OTHER): Payer: Self-pay | Admitting: Surgery

## 2013-11-30 ENCOUNTER — Ambulatory Visit (INDEPENDENT_AMBULATORY_CARE_PROVIDER_SITE_OTHER): Payer: BC Managed Care – PPO | Admitting: Surgery

## 2013-11-30 VITALS — BP 122/78 | Temp 97.5°F | Resp 16 | Ht 69.0 in | Wt 203.6 lb

## 2013-11-30 DIAGNOSIS — Z09 Encounter for follow-up examination after completed treatment for conditions other than malignant neoplasm: Secondary | ICD-10-CM

## 2013-11-30 NOTE — Progress Notes (Signed)
Subjective:     Patient ID: Guy Evans, male   DOB: 04/13/1951, 63 y.o.   MRN: [id number]  HPI He is here for his first postop visit status post laparoscopic cholecystectomy. He had a lot emesis the day before surgery as well as the day of surgery. Since then he has felt well but has been minimal.  He has been moving his bowels well. He has no pain today  Review of Systems     Objective:   Physical Exam On exam, his abdomen is soft and his incisions are well healed. The final pathology showed chronic cholecystitis    Assessment:     Patient stable postop     Plan:     He is just under 2 weeks from surgery. It is still difficult to tell whether this helped as he is afraid to eat any food. I encouraged him to start trying to modify his diet and eat. He may resume normal activity. I will see him back as needed. He will see Dr. Paulita Fujita in the near future

## 2014-04-16 ENCOUNTER — Other Ambulatory Visit: Payer: Self-pay

## 2016-06-30 ENCOUNTER — Encounter: Payer: Self-pay | Admitting: Medical

## 2016-06-30 ENCOUNTER — Ambulatory Visit (INDEPENDENT_AMBULATORY_CARE_PROVIDER_SITE_OTHER): Payer: Medicare Other | Admitting: Medical

## 2016-06-30 VITALS — BP 130/80 | HR 70 | Wt 237.0 lb

## 2016-06-30 DIAGNOSIS — N400 Enlarged prostate without lower urinary tract symptoms: Secondary | ICD-10-CM

## 2016-06-30 DIAGNOSIS — Z23 Encounter for immunization: Secondary | ICD-10-CM

## 2016-06-30 MED ORDER — TERAZOSIN HCL 10 MG PO CAPS: 10.0000 mg | ORAL_CAPSULE | Freq: Every day | ORAL | 1 refills | Status: DC

## 2016-06-30 NOTE — Progress Notes (Signed)
  Subjective:  Guy Evans is a 65 y.o. male who presents as a new patient.   For years went to [facility], but this past year went on Medicare and [facility] didn't take medicare.  So needs new PCP.  He lives in [place].      Had shingles vaccine last year 2016.    Takes Terazosin for BPH for 4-5 years.   Does fine on this, however if he misses it, urinates frequently.  No other c/o.  The following portions of the patient's history were reviewed and updated as appropriate: allergies, current medications, past family history, past medical history, past social history, past surgical history and problem list.  ROS Otherwise as in subjective above   Past Medical History:  Diagnosis Date  . Enlarged prostate    takes Terazosin daily  . Gastroparesis   . GERD (gastroesophageal reflux disease)    occasionally but no meds required  . History of colon polyps   . History of gastric ulcer   . Joint pain    knees and ankles  . Urinary frequency    No current outpatient prescriptions on file prior to visit.   No current facility-administered medications on file prior to visit.      Objective: Physical Exam BP 130/80   Pulse 70   Wt 237 lb (107.5 kg)   SpO2 97%   BMI 35.00 kg/m   General appearance: alert, no distress, WD/WN, obese white male Neck: supple, no lymphadenopathy, no thyromegaly, no masses Heart: RRR, normal S1, S2, no murmurs Lungs: CTA bilaterally, no wheezes, rhonchi, or rales Abdomen: +bs, soft, non tender, non distended, no masses, no hepatomegaly, no splenomegaly Pulses: 2+ radial pulses, 2+ pedal pulses, normal cap refill Ext: no edema   Assessment: Encounter Diagnoses  Name Primary?  Marland Kitchen BPH without obstruction/lower urinary tract symptoms Yes  . Need for influenza vaccination      Plan: BPH - c/t terazosin, refill today Reviewed his health history Will get copy of last colonoscopy with Dr. Paulita Fujita in last few  years reviewed the EGD in the chart from Dr. Paulita Fujita Reviewed labs from few years ago Counseled on the influenza virus vaccine.  Vaccine information sheet given.   High dose Influenza vaccine given after consent obtained. He notes having shingles vaccine 2016 Follow up: within 1mofor medicare wellness/physical/fasting labs

## 2016-07-02 ENCOUNTER — Telehealth: Payer: Self-pay | Admitting: Medical

## 2016-07-02 NOTE — Telephone Encounter (Signed)
Rcvd office notes, colonoscopy, pathology report from [facility]

## 2016-07-08 ENCOUNTER — Encounter: Payer: Self-pay | Admitting: Medical

## 2016-07-21 ENCOUNTER — Encounter: Payer: Self-pay | Admitting: Medical

## 2016-09-22 ENCOUNTER — Ambulatory Visit: Payer: Medicare Other | Admitting: Medical

## 2016-10-18 ENCOUNTER — Encounter: Payer: Self-pay | Admitting: Medical

## 2016-10-18 ENCOUNTER — Ambulatory Visit (INDEPENDENT_AMBULATORY_CARE_PROVIDER_SITE_OTHER): Payer: Medicare Other | Admitting: Medical

## 2016-10-18 VITALS — BP 128/80 | HR 82 | Ht 67.5 in | Wt 231.2 lb

## 2016-10-18 DIAGNOSIS — Z125 Encounter for screening for malignant neoplasm of prostate: Secondary | ICD-10-CM | POA: Diagnosis not present

## 2016-10-18 DIAGNOSIS — Z8249 Family history of ischemic heart disease and other diseases of the circulatory system: Secondary | ICD-10-CM | POA: Insufficient documentation

## 2016-10-18 DIAGNOSIS — Z13220 Encounter for screening for lipoid disorders: Secondary | ICD-10-CM

## 2016-10-18 DIAGNOSIS — Z131 Encounter for screening for diabetes mellitus: Secondary | ICD-10-CM

## 2016-10-18 DIAGNOSIS — Z0000 Encounter for general adult medical examination without abnormal findings: Secondary | ICD-10-CM | POA: Diagnosis not present

## 2016-10-18 DIAGNOSIS — K635 Polyp of colon: Secondary | ICD-10-CM

## 2016-10-18 DIAGNOSIS — Z1211 Encounter for screening for malignant neoplasm of colon: Secondary | ICD-10-CM | POA: Insufficient documentation

## 2016-10-18 DIAGNOSIS — Z7189 Other specified counseling: Secondary | ICD-10-CM | POA: Diagnosis not present

## 2016-10-18 DIAGNOSIS — K2270 Barrett's esophagus without dysplasia: Secondary | ICD-10-CM

## 2016-10-18 DIAGNOSIS — Z7185 Encounter for immunization safety counseling: Secondary | ICD-10-CM | POA: Insufficient documentation

## 2016-10-18 LAB — LIPID PANEL
Cholesterol: 195 mg/dL (ref <200)
HDL: 47 mg/dL (ref >40)
LDL Cholesterol: 123 mg/dL — ABNORMAL HIGH (ref <100)
Total CHOL/HDL Ratio: 4.1 Ratio (ref <5.0)
Triglycerides: 127 mg/dL (ref <150)
VLDL: 25 mg/dL (ref <30)

## 2016-10-18 LAB — CBC
HCT: 44.5 % (ref 38.5–50.0)
Hemoglobin: 15.3 g/dL (ref 13.2–17.1)
MCH: 31.2 pg (ref 27.0–33.0)
MCHC: 34.4 g/dL (ref 32.0–36.0)
MCV: 90.6 fL (ref 80.0–100.0)
MPV: 11.1 fL (ref 7.5–12.5)
Platelets: 225 10*3/uL (ref 140–400)
RBC: 4.91 MIL/uL (ref 4.20–5.80)
RDW: 13.9 % (ref 11.0–15.0)
WBC: 6.9 10*3/uL (ref 4.0–10.5)

## 2016-10-18 LAB — HEMOGLOBIN A1C
Hgb A1c MFr Bld: 5 % (ref <5.7)
Mean Plasma Glucose: 97 mg/dL

## 2016-10-18 LAB — PSA: PSA: 2.6 ng/mL (ref <=4.0)

## 2016-10-18 LAB — COMPREHENSIVE METABOLIC PANEL
ALT: 11 U/L (ref 9–46)
AST: 13 U/L (ref 10–35)
Albumin: 4.4 g/dL (ref 3.6–5.1)
Alkaline Phosphatase: 74 U/L (ref 40–115)
BUN: 11 mg/dL (ref 7–25)
CO2: 27 mmol/L (ref 20–31)
Calcium: 9.5 mg/dL (ref 8.6–10.3)
Chloride: 103 mmol/L (ref 98–110)
Creat: 1.03 mg/dL (ref 0.70–1.25)
Glucose, Bld: 99 mg/dL (ref 65–99)
Potassium: 4.3 mmol/L (ref 3.5–5.3)
Sodium: 138 mmol/L (ref 135–146)
Total Bilirubin: 0.6 mg/dL (ref 0.2–1.2)
Total Protein: 7 g/dL (ref 6.1–8.1)

## 2016-10-18 NOTE — Patient Instructions (Signed)
Recommendations:  See your eye doctor yearly for routine vision care.  See your dentist yearly for routine dental care including hygiene visits twice yearly.  Call your insurer to check coverage for pneumococcal and shingles and tetanus vaccines  Call [facility] about follow up on barrett's esophagus and colono polyp  Get exercise regularly  Eat a healthy low fat diet  Work on losing weight

## 2016-10-18 NOTE — Progress Notes (Signed)
Subjective:    Guy Evans is a 66 y.o. male who presents for Preventative Services visit and chronic medical problems/med check visit.    Primary Care Provider Crisoforo Oxford, PA-C here for primary care  Current Health Care Team:  Dentist, Dr. Angelyn Punt  Eye doctor, Dr. none  Medical Services you may have received from other than [facility] providers in the past year (date may be approximate) none  Exercise Current exercise habits: no Nutrition/Diet Current diet: none  Depression Screen Depression screen [facility] 2/9 10/18/2016  Decreased Interest 0  Down, Depressed, Hopeless 0  PHQ - 2 Score 0  Altered sleeping -  Tired, decreased energy -  Change in appetite -  Feeling bad or failure about yourself  -  Trouble concentrating -  Moving slowly or fidgety/restless -  PHQ-9 Score -    Activities of Daily Living Screen/Functional Status Survey Is the patient deaf or have difficulty hearing?: No Does the patient have difficulty seeing, even when wearing glasses/contacts?: Yes (wears reading glasses) Does the patient have difficulty concentrating, remembering, or making decisions?: Yes ( sometimes remembering things) Does the patient have difficulty walking or climbing stairs?: No Does the patient have difficulty dressing or bathing?: No Does the patient have difficulty doing errands alone such as visiting a doctor's office or shopping?: No  Can patient draw a clock face showing 3:15 o'clock, yes  Fall Risk Screen Fall Risk  10/18/2016 10/18/2016  Falls in the past year? No No    Gait Assessment: Normal gait observed yes  Advanced directives Does patient have a New Alluwe? yes Does patient have a Living Will? yes  Past Medical History:  Diagnosis Date  . Enlarged prostate    takes Terazosin daily  . Gastroparesis   . GERD (gastroesophageal reflux disease)    occasionally but no meds required  . History of colon polyps   . History of  gastric ulcer   . Joint pain    knees and ankles  . Urinary frequency     Past Surgical History:  Procedure Laterality Date  . CHOLECYSTECTOMY N/A 11/19/2013   Procedure: LAPAROSCOPIC CHOLECYSTECTOMY;  Surgeon: Harl Bowie, MD;  Location: [place];  Service: General;  Laterality: N/A;  . COLONOSCOPY    . ESOPHAGOGASTRODUODENOSCOPY    . skin spots removed and tested     done in the office    Social History   Social History  . Marital status: Married    Spouse name: N/A  . Number of children: N/A  . Years of education: N/A   Occupational History  . Not on file.   Social History Main Topics  . Smoking status: Former Research scientist (life sciences)  . Smokeless tobacco: Never Used     Comment: quit smoking around 1993  . Alcohol use 4.8 oz/week    4 Shots of liquor, 4 Glasses of wine per week     Comment: none since gallbladder issues  . Drug use: No  . Sexual activity: Yes   Other Topics Concern  . Not on file   Social History Narrative   Not exercising.  Married.   Has 2 children, 2 grandchildren, one child [place], [place], one in [place], [place].   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 Dementia Mother   . Heart disease Father 21    MI at 57yo, angina  . Obesity Father   . Cancer Neg  Hx   . Stroke Neg Hx   . Diabetes Neg Hx      Current Outpatient Prescriptions:  .  glucosamine-chondroitin 500-400 MG tablet, Take 2 tablets by mouth 1 day or 1 dose., Disp: , Rfl:  .  terazosin (HYTRIN) 10 MG capsule, Take 1 capsule (10 mg total) by mouth at bedtime., Disp: 90 capsule, Rfl: 1  No Known Allergies  History reviewed: allergies, current medications, past family history, past medical history, past social history, past surgical history and problem list   Objective:      Biometrics BP 128/80   Pulse 82   Ht 5' 7.5" (1.715 m)   Wt 231 lb 3.2 oz (104.9 kg)   SpO2 96%   BMI 35.68 kg/m   Cognitive Testing  Alert? Yes   Normal Appearance?Yes  Oriented to person? Yes  Place? Yes   Time? Yes  Recall of three objects?  Yes  Can perform simple calculations? Yes  Displays appropriate judgment?Yes  Can read the correct time from a watch face?Yes  General appearance: alert, no distress, WD/WN, white male  Nutritional Status: Inadequate calore intake? no Loss of muscle mass? no Loss of fat beneath skin? no Localized or general edema? no Diminished functional status? no  Other pertinent exam: HEENT: normocephalic, sclerae anicteric, TMs pearly, nares patent, no discharge or erythema, pharynx normal Oral cavity: MMM, no lesions Neck: supple, no lymphadenopathy, no thyromegaly, no masses Heart: RRR, normal S1, S2, no murmurs Lungs: CTA bilaterally, no wheezes, rhonchi, or rales Abdomen: +bs, soft, non tender, non distended, no masses, no hepatomegaly, no splenomegaly Musculoskeletal: nontender, no swelling, no obvious deformity Extremities: no edema, no cyanosis, no clubbing Pulses: 2+ symmetric, upper and lower extremities, normal cap refill Neurological: alert, oriented x 3, CN2-12 intact, strength normal upper extremities and lower extremities, sensation normal throughout, DTRs 2+ throughout, no cerebellar signs, gait normal Psychiatric: normal affect, behavior normal, pleasant  GU: normal male, circumcised, no hernia, no lymphadenopathy Declines rectal  Assessment:   Encounter Diagnoses  Name Primary?  . Encounter for health maintenance examination in adult Yes  . Medicare annual wellness visit, subsequent   . Barrett's esophagus without dysplasia   . Polyp of colon, unspecified part of colon, unspecified type   . Vaccine counseling   . Screening for prostate cancer   . Family history of premature CAD   . Screening for diabetes mellitus   . Screening for lipid disorders      Plan:   A preventative services visit was completed today.  During the course of the visit today, we discussed and  counseled about appropriate screening and preventive services.  A health risk assessment was established today that included a review of current medications, allergies, social history, family history, medical and preventative health history, biometrics, and preventative screenings to identify potential safety concerns or impairments.  A personalized plan was printed today for your records and use.   Personalized health advice and education was given today to reduce health risks and promote self management and wellness.  Information regarding end of life planning was discussed today.  Conditions/risks identified: barrett's esophagus on prior EGD, due for repeat this year, [facility]  Chronic problems discussed today: Enlarged prostate  Acute problems discussed today: none  Recommendations:  I recommend a yearly ophthalmology/optometry visit for glaucoma screening and eye checkup  I recommended a yearly dental visit for hygiene and checkup   Referrals today: Advised f/u with GI  Immunizations: I recommended a yearly influenza  vaccine, typically in September when the vaccine is usually available Is the Pneumococcal vaccine up to date: no. Is the Shingles vaccine up to date: no.   Is the Td/Tdap vaccine up to date: no.  Rosie was seen today for medicare visit.  Diagnoses and all orders for this visit:  Encounter for health maintenance examination in adult -     Comprehensive metabolic panel -     CBC -     Lipid panel -     PSA -     Hemoglobin A1c  Medicare annual wellness visit, subsequent  Barrett's esophagus without dysplasia  Polyp of colon, unspecified part of colon, unspecified type  Vaccine counseling  Screening for prostate cancer  Family history of premature CAD -     Lipid panel  Screening for diabetes mellitus -     Hemoglobin A1c  Screening for lipid disorders -     Lipid panel    Medicare Attestation A preventative services visit was completed  today.  During the course of the visit the patient was educated and counseled about appropriate screening and preventive services.  A health risk assessment was established with the patient that included a review of current medications, allergies, social history, family history, medical and preventative health history, biometrics, and preventative screenings to identify potential safety concerns or impairments.  A personalized plan was printed today for the patient's records and use.   Personalized health advice and education was given today to reduce health risks and promote self management and wellness.  Information regarding end of life planning was discussed today.  Crisoforo Oxford, PA-C   10/18/2016

## 2016-10-18 NOTE — Addendum Note (Signed)
Addended by: Carlena Hurl on: 10/18/2016 09:06 PM   Modules accepted: Level of Service

## 2016-10-19 ENCOUNTER — Other Ambulatory Visit: Payer: Self-pay | Admitting: Medical

## 2016-10-19 MED ORDER — TERAZOSIN HCL 10 MG PO CAPS: 10.0000 mg | ORAL_CAPSULE | Freq: Every day | ORAL | 3 refills | Status: DC

## 2017-01-17 ENCOUNTER — Encounter: Payer: Self-pay | Admitting: Medical

## 2017-01-17 ENCOUNTER — Ambulatory Visit (INDEPENDENT_AMBULATORY_CARE_PROVIDER_SITE_OTHER): Payer: Medicare Other | Admitting: Medical

## 2017-01-17 VITALS — BP 132/80 | HR 74 | Wt 231.4 lb

## 2017-01-17 DIAGNOSIS — K635 Polyp of colon: Secondary | ICD-10-CM

## 2017-01-17 DIAGNOSIS — K2270 Barrett's esophagus without dysplasia: Secondary | ICD-10-CM | POA: Diagnosis not present

## 2017-01-17 DIAGNOSIS — E7800 Pure hypercholesterolemia, unspecified: Secondary | ICD-10-CM | POA: Diagnosis not present

## 2017-01-17 DIAGNOSIS — K649 Unspecified hemorrhoids: Secondary | ICD-10-CM | POA: Diagnosis not present

## 2017-01-17 DIAGNOSIS — Z8249 Family history of ischemic heart disease and other diseases of the circulatory system: Secondary | ICD-10-CM | POA: Diagnosis not present

## 2017-01-17 DIAGNOSIS — IMO0001 Reserved for inherently not codable concepts without codable children: Secondary | ICD-10-CM

## 2017-01-17 DIAGNOSIS — E669 Obesity, unspecified: Secondary | ICD-10-CM | POA: Diagnosis not present

## 2017-01-17 DIAGNOSIS — E785 Hyperlipidemia, unspecified: Secondary | ICD-10-CM | POA: Insufficient documentation

## 2017-01-17 MED ORDER — HYDROCORTISONE 2.5 % RE CREA
1.0000 | TOPICAL_CREAM | Freq: Two times a day (BID) | RECTAL | 0 refills | Status: DC
Start: 2017-01-17 — End: 2018-04-03

## 2017-01-17 NOTE — Progress Notes (Signed)
Subjective: Chief Complaint  Patient presents with  . Follow-up    follow up from weight loss and  colonscopy he didn;t have it    Here for f/u on obesity and need for EGD/Colonoscopy repeat.     Tried to do prep but couldn't get cleaned out enough, so hasn't done EGD/Colonoscopy yet.     Since last visit has had some flare of hemorrhoids.  Not so bad last few days.   Used preparation H  He has been working some on diet and exercise, but he isn't too worried about his weight.   No other new c/o.  Past Medical History:  Diagnosis Date  . Enlarged prostate    takes Terazosin daily  . Gastroparesis   . GERD (gastroesophageal reflux disease)    occasionally but no meds required  . History of colon polyps   . History of gastric ulcer   . Joint pain    knees and ankles  . Urinary frequency    Current Outpatient Prescriptions on File Prior to Visit  Medication Sig Dispense Refill  . glucosamine-chondroitin 500-400 MG tablet Take 2 tablets by mouth 1 day or 1 dose.    . terazosin (HYTRIN) 10 MG capsule Take 1 capsule (10 mg total) by mouth at bedtime. 90 capsule 3   No current facility-administered medications on file prior to visit.    Review of Systems Constitutional: -fever, -chills, -sweats, -unexpected weight change,-fatigue ENT: -runny nose, -ear pain, -sore throat Cardiology:  -chest pain, -palpitations, -edema Respiratory: -cough, -shortness of breath, -wheezing Gastroenterology: -abdominal pain, -nausea, -vomiting, -diarrhea, -constipation Hematology: -bleeding or bruising problems Musculoskeletal: -arthralgias, -myalgias, -joint swelling, -back pain Ophthalmology: -vision changes Urology: -dysuria, -difficulty urinating, -hematuria, -urinary frequency, -urgency Neurology: -headache, -weakness, -tingling, -numbness     Objective: BP 132/80   Pulse 74   Wt 231 lb 6.4 oz (105 kg)   SpO2 96%   BMI 35.71 kg/m   Wt Readings from Last 3 Encounters:  01/17/17 231 lb  6.4 oz (105 kg)  10/18/16 231 lb 3.2 oz (104.9 kg)  06/30/16 237 lb (107.5 kg)   Gen: wd, wn, nad otherwise not examined Declines rectal exam   Assessment: Encounter Diagnoses  Name Primary?  . Polyp of colon, unspecified part of colon, unspecified type Yes  . Barrett's esophagus without dysplasia   . Class 2 obesity with serious comorbidity in adult, unspecified BMI, unspecified obesity type   . Elevated cholesterol   . Family history of premature CAD   . Hemorrhoids, unspecified hemorrhoid type      Plan: Advised he f/u with GI for other ways to help get prepped adequately for colonoscopy and plan for f/u EGD as well.  Colonoscopy is repeat due to hx/o polyps  Obesity - discussed need for better efforts with diet, exercise .  He doesn't seem all that motivated.   Discussed risks of obesity.  Elevated cholesterol, family hx/o premature CAD - same plan as obesity  Hemorrhoids - discussed remedies for acute flare, prevention, cream prescribed today for future need.  He declines suppositories .    Audric was seen today for follow-up.  Diagnoses and all orders for this visit:  Polyp of colon, unspecified part of colon, unspecified type  Barrett's esophagus without dysplasia  Class 2 obesity with serious comorbidity in adult, unspecified BMI, unspecified obesity type  Elevated cholesterol  Family history of premature CAD  Hemorrhoids, unspecified hemorrhoid type  Other orders -     hydrocortisone (ANUSOL-HC) 2.5 %  rectal cream; Place 1 application rectally 2 (two) times daily.

## 2017-11-08 DIAGNOSIS — R69 Illness, unspecified: Secondary | ICD-10-CM | POA: Diagnosis not present

## 2017-12-14 DIAGNOSIS — R69 Illness, unspecified: Secondary | ICD-10-CM | POA: Diagnosis not present

## 2017-12-16 ENCOUNTER — Other Ambulatory Visit: Payer: Self-pay | Admitting: Medical

## 2017-12-16 NOTE — Telephone Encounter (Signed)
Okay to refill.  Thanks.

## 2018-01-10 DIAGNOSIS — L821 Other seborrheic keratosis: Secondary | ICD-10-CM | POA: Diagnosis not present

## 2018-01-10 DIAGNOSIS — D2262 Melanocytic nevi of left upper limb, including shoulder: Secondary | ICD-10-CM | POA: Diagnosis not present

## 2018-01-10 DIAGNOSIS — L820 Inflamed seborrheic keratosis: Secondary | ICD-10-CM | POA: Diagnosis not present

## 2018-01-10 DIAGNOSIS — D225 Melanocytic nevi of trunk: Secondary | ICD-10-CM | POA: Diagnosis not present

## 2018-01-10 DIAGNOSIS — Z85828 Personal history of other malignant neoplasm of skin: Secondary | ICD-10-CM | POA: Diagnosis not present

## 2018-03-16 ENCOUNTER — Other Ambulatory Visit: Payer: Self-pay | Admitting: Medical

## 2018-03-16 NOTE — Telephone Encounter (Signed)
See prior msg I sent to you.   REFILL x #30 days but needs appt now

## 2018-03-16 NOTE — Telephone Encounter (Signed)
Is this ok to refill?  

## 2018-03-16 NOTE — Telephone Encounter (Signed)
Refill but needs appt.  Last visit 01/2017!

## 2018-04-03 ENCOUNTER — Telehealth: Payer: Self-pay | Admitting: Medical

## 2018-04-03 ENCOUNTER — Ambulatory Visit (INDEPENDENT_AMBULATORY_CARE_PROVIDER_SITE_OTHER): Payer: Medicare HMO | Admitting: Medical

## 2018-04-03 ENCOUNTER — Encounter: Payer: Self-pay | Admitting: Medical

## 2018-04-03 VITALS — BP 122/70 | HR 66 | Temp 98.0°F | Ht 67.25 in | Wt 243.2 lb

## 2018-04-03 DIAGNOSIS — Z136 Encounter for screening for cardiovascular disorders: Secondary | ICD-10-CM | POA: Diagnosis not present

## 2018-04-03 DIAGNOSIS — Z7189 Other specified counseling: Secondary | ICD-10-CM

## 2018-04-03 DIAGNOSIS — K2270 Barrett's esophagus without dysplasia: Secondary | ICD-10-CM

## 2018-04-03 DIAGNOSIS — N400 Enlarged prostate without lower urinary tract symptoms: Secondary | ICD-10-CM | POA: Diagnosis not present

## 2018-04-03 DIAGNOSIS — Z8249 Family history of ischemic heart disease and other diseases of the circulatory system: Secondary | ICD-10-CM

## 2018-04-03 DIAGNOSIS — Z0000 Encounter for general adult medical examination without abnormal findings: Secondary | ICD-10-CM

## 2018-04-03 DIAGNOSIS — Z7185 Encounter for immunization safety counseling: Secondary | ICD-10-CM

## 2018-04-03 DIAGNOSIS — M722 Plantar fascial fibromatosis: Secondary | ICD-10-CM | POA: Insufficient documentation

## 2018-04-03 DIAGNOSIS — E669 Obesity, unspecified: Secondary | ICD-10-CM

## 2018-04-03 DIAGNOSIS — E7800 Pure hypercholesterolemia, unspecified: Secondary | ICD-10-CM

## 2018-04-03 DIAGNOSIS — K635 Polyp of colon: Secondary | ICD-10-CM

## 2018-04-03 DIAGNOSIS — Z5320 Procedure and treatment not carried out because of patient's decision for unspecified reasons: Secondary | ICD-10-CM

## 2018-04-03 DIAGNOSIS — R490 Dysphonia: Secondary | ICD-10-CM | POA: Insufficient documentation

## 2018-04-03 DIAGNOSIS — Z1159 Encounter for screening for other viral diseases: Secondary | ICD-10-CM | POA: Insufficient documentation

## 2018-04-03 DIAGNOSIS — Z1211 Encounter for screening for malignant neoplasm of colon: Secondary | ICD-10-CM

## 2018-04-03 DIAGNOSIS — Z131 Encounter for screening for diabetes mellitus: Secondary | ICD-10-CM

## 2018-04-03 NOTE — Telephone Encounter (Signed)
Please refer for Cologuard

## 2018-04-03 NOTE — Progress Notes (Signed)
Subjective:    Guy Evans is a 67 y.o. male who presents for Preventative Services visit and chronic medical problems/med check visit.    Primary Care Provider Tysinger, Camelia Eng, PA-C here for primary care   Current Health Care Team:  Dentist, Dr. Quillian Quince (Retired)   Eye doctor, [place] you may have received from other than [facility] providers in the past year (date may be approximate) [facility]  Exercise Current exercise habits: The patient does not participate in regular exercise at present.   Nutrition/Diet Current diet: in general, an "unhealthy" diet  Depression Screen Depression screen [facility] 2/9 04/03/2018  Decreased Interest 0  Down, Depressed, Hopeless 0  PHQ - 2 Score 0  Altered sleeping -  Tired, decreased energy -  Change in appetite -  Feeling bad or failure about yourself  -  Trouble concentrating -  Moving slowly or fidgety/restless -  PHQ-9 Score -    Activities of Daily Living Screen/Functional Status Survey Is the patient deaf or have difficulty hearing?: No Does the patient have difficulty seeing, even when wearing glasses/contacts?: No Does the patient have difficulty concentrating, remembering, or making decisions?: Yes(remembering ) Does the patient have difficulty walking or climbing stairs?: No Does the patient have difficulty dressing or bathing?: No Does the patient have difficulty doing errands alone such as visiting a doctor's office or shopping?: No  Can patient draw a clock face showing 3:15 oclock, yes  Fall Risk Screen Fall Risk  04/03/2018 10/18/2016 10/18/2016  Falls in the past year? No No No    Gait Assessment: Normal gait observed yes   Advanced directives Does patient have a [place]? Yes Does patient have a Living Will? Yes  Past Medical History:  Diagnosis Date  . Enlarged prostate    takes Terazosin daily  . Gastroparesis   . GERD (gastroesophageal reflux disease)    occasionally but no meds required  . History of colon polyps   . History of gastric ulcer   . Joint pain    knees and ankles  . Urinary frequency     Past Surgical History:  Procedure Laterality Date  . CHOLECYSTECTOMY N/A 11/19/2013   Procedure: LAPAROSCOPIC CHOLECYSTECTOMY;  Surgeon: Harl Bowie, MD;  Location: [place];  Service: General;  Laterality: N/A;  . COLONOSCOPY    . ESOPHAGOGASTRODUODENOSCOPY    . skin spots removed and tested     done in the office    Social History   Socioeconomic History  . Marital status: Married    Spouse name: Not on file  . Number of children: Not on file  . Years of education: Not on file  . Highest education level: Not on file  Occupational History  . Not on file  Social Needs  . Financial resource strain: Not on file  . Food insecurity:    Worry: Not on file    Inability: Not on file  . Transportation needs:    Medical: Not on file    Non-medical: Not on file  Tobacco Use  . Smoking status: Former Research scientist (life sciences)  . Smokeless tobacco: Never Used  . Tobacco comment: quit smoking around 1993  Substance and Sexual Activity  . Alcohol use: Yes    Alcohol/week: 4.8 oz    Types: 4 Shots of liquor, 4 Glasses of wine per week    Comment: none since gallbladder issues  . Drug use: No  . Sexual activity: Yes  Lifestyle  . Physical  activity:    Days per week: Not on file    Minutes per session: Not on file  . Stress: Not on file  Relationships  . Social connections:    Talks on phone: Not on file    Gets together: Not on file    Attends religious service: Not on file    Active member of club or organization: Not on file    Attends meetings of clubs or organizations: Not on file    Relationship status: Not on file  . Intimate partner violence:    Fear of current or ex partner: Not on file    Emotionally abused: Not on file    Physically abused: Not on file    Forced sexual activity: Not on file  Other Topics Concern  . Not on  file  Social History Narrative   Not exercising.  Married.   Has 2 children, 2 grandchildren, one child [place], [place], one in [place], [place].   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 Dementia Mother   . Heart disease Father 9       MI at 57yo, angina  . Obesity Father   . Cancer Neg Hx   . Stroke Neg Hx   . Diabetes Neg Hx      Current Outpatient Medications:  .  glucosamine-chondroitin 500-400 MG tablet, Take 2 tablets by mouth 1 day or 1 dose., Disp: , Rfl:  .  omeprazole (PRILOSEC) 20 MG capsule, Take by mouth., Disp: , Rfl:  .  terazosin (HYTRIN) 10 MG capsule, TAKE 1 CAPSULE BY MOUTH EVERYDAY AT BEDTIME, Disp: 30 capsule, Rfl: 0  No Known Allergies  History reviewed: allergies, current medications, past family history, past medical history, past social history, past surgical history and problem list  Chronic issues discussed: Compliant with medication for BPH  He has some issues with plantar fasciitis and heel pain ongoing.  Uses Dr. Felicie Morn insert in the shoe, does stretches to help with plantar fasciitis.  He notes some hip pain, worse upon begin services like sand or uneven surfaces.  No injury or fall.  This is an ongoing chronic issue  He reports some hoarseness in the throat over the past year, no fever, no night sweats, no weight loss  Objective:    Biometrics BP 122/70   Pulse 66   Temp 98 F (36.7 C) (Oral)   Ht 5' 7.25" (1.708 m)   Wt 243 lb 3.2 oz (110.3 kg)   SpO2 94%   BMI 37.81 kg/m    BP Readings from Last 3 Encounters:  04/03/18 122/70  01/17/17 132/80  10/18/16 128/80   Wt Readings from Last 3 Encounters:  04/03/18 243 lb 3.2 oz (110.3 kg)  01/17/17 231 lb 6.4 oz (105 kg)  10/18/16 231 lb 3.2 oz (104.9 kg)   Gen: wd, wn nad,  Obese white male Skin: Scattered macules, no worrisome lesions HEENT: normocephalic, sclerae anicteric, TMs pearly, nares patent, no discharge or  erythema, pharynx normal Oral cavity: MMM, no lesions Neck: supple, no lymphadenopathy, no thyromegaly, no masses Heart: RRR, normal S1, S2, no murmurs Lungs: CTA bilaterally, no wheezes, rhonchi, or rales Abdomen: +bs, soft, non tender, non distended, no masses, no hepatomegaly, no splenomegaly Musculoskeletal: Hips with good range of motion, nontender, no swelling or deformity, feet nontender, somewhat flattened arches, no swelling or other deformity, nontender, no swelling, no obvious deformity Extremities: no edema, no  cyanosis, no clubbing Pulses: 2+ symmetric, upper and lower extremities, normal cap refill Neurological: alert, oriented x 3, CN2-12 intact, strength normal upper extremities and lower extremities, sensation normal throughout, DTRs 2+ throughout, no cerebellar signs, gait normal Psychiatric: normal affect, behavior normal, pleasant  GU and rectal declined   Assessment:   Encounter Diagnoses  Name Primary?  . Elevated cholesterol Yes  . Medicare annual wellness visit, subsequent   . Obesity with serious comorbidity, unspecified classification, unspecified obesity type   . Barrett's esophagus without dysplasia   . Polyp of colon, unspecified part of colon, unspecified type   . Family history of premature CAD   . Vaccine counseling   . Benign prostatic hyperplasia, unspecified whether lower urinary tract symptoms present   . Screening for heart disease   . Hoarseness   . Plantar fasciitis      Plan:   A preventative services visit was completed today.  During the course of the visit today, we discussed and counseled about appropriate screening and preventive services.  A health risk assessment was established today that included a review of current medications, allergies, social history, family history, medical and preventative health history, biometrics, and preventative screenings to identify potential safety concerns or impairments.  A personalized plan was  printed today for your records and use.   Personalized health advice and education was given today to reduce health risks and promote self management and wellness.  Information regarding end of life planning was discussed today.  Conditions/risks identified: obesity   Chronic problems discussed today: Past Medical History:  Diagnosis Date  . Enlarged prostate    takes Terazosin daily  . Gastroparesis   . GERD (gastroesophageal reflux disease)    occasionally but no meds required  . History of colon polyps   . History of gastric ulcer   . Joint pain    knees and ankles  . Urinary frequency    BPH-lab today, continue current medications  Hoarseness-consider referral to ENT, he will let me know if agreeable  He declines future colonoscopies, will refer for Cologuard  Hip pain- good range of motion, offered x-rays.  He declines.  Discussed stretching and range of motion activity, home exercise program  Plantar fasciitis-discussed, Symptoms and treatment recommendations  Acute problems discussed today: none  Recommendations:  I recommend a yearly ophthalmology/optometry visit for glaucoma screening and eye checkup  I recommended a yearly dental visit for hygiene and checkup  Advanced directives - discussed nature and purpose of Advanced Directives, encouraged them to complete them if they have not done so and/or encouraged them to get [place] a copy if they have done this already.  Referrals today: Cologuard  Immunizations: Recommended Shingrix, Td, and pneumococcal vaccines. Will try and get copy of prior vaccines  Javonte was seen today for medicare wellness.  Diagnoses and all orders for this visit:  Elevated cholesterol -     Lipid panel; Future -     Hemoglobin A1c; Future  Medicare annual wellness visit, subsequent  Obesity with serious comorbidity, unspecified classification, unspecified obesity type -     Hemoglobin A1c; Future  Barrett's esophagus without  dysplasia -     Comprehensive metabolic panel; Future -     CBC; Future  Polyp of colon, unspecified part of colon, unspecified type  Family history of premature CAD  Vaccine counseling  Benign prostatic hyperplasia, unspecified whether lower urinary tract symptoms present -     PSA; Future  Screening for heart disease  Hoarseness  Plantar fasciitis    Medicare Attestation A preventative services visit was completed today.  During the course of the visit the patient was educated and counseled about appropriate screening and preventive services.  A health risk assessment was established with the patient that included a review of current medications, allergies, social history, family history, medical and preventative health history, biometrics, and preventative screenings to identify potential safety concerns or impairments.  A personalized plan was printed today for the patient's records and use.   Personalized health advice and education was given today to reduce health risks and promote self management and wellness.  Information regarding end of life planning was discussed today.  Dorothea Ogle, PA-C   04/03/2018

## 2018-04-03 NOTE — Patient Instructions (Signed)
Thanks for trusting [place] with your health care and for coming in for a physical today.  Below are some general recommendations I have for you:  Yearly screenings See your eye doctor yearly for routine vision care. See your dentist yearly for routine dental care including hygiene visits twice yearly. See me here yearly for a routine physical and preventative care visit   Specific Concerns today:  . You are currently showing no prior vaccines here for tetanus, pneumococcal and shingles.  We will try and get a copy of your prior records on vaccines  . return for fasting labs at your convenience . I recommend referral to ENT specialist, ear nose and throat to evaluate chronic hoarseness . We will refer you to Cologuard colon cancer screening   Please follow up yearly for a physical.   Preventative Care for Adults - Male      [place]:  A routine yearly physical is a good way to check in with your primary care provider about your health and preventive screening. It is also an opportunity to share updates about your health and any concerns you have, and receive a thorough all-over exam.   Most health insurance companies pay for at least some preventative services.  Check with your health plan for specific coverages.  WHAT PREVENTATIVE SERVICES DO WOMEN NEED?  Adult men should have their weight and blood pressure checked regularly.   Men age 39 and older should have their cholesterol levels checked regularly.  Beginning at age 42 and continuing to age 72, men should be screened for colorectal cancer.  Certain people may need continued testing until age 77.  Updating vaccinations is part of preventative care.  Vaccinations help protect against diseases such as the flu.  Osteoporosis is a disease in which the bones lose minerals and strength as we age. Men ages 48 and over should discuss this with their caregivers  Lab tests are generally done as part of preventative  care to screen for anemia and blood disorders, to screen for problems with the kidneys and liver, to screen for bladder problems, to check blood sugar, and to check your cholesterol level.  Preventative services generally include counseling about diet, exercise, avoiding tobacco, drugs, excessive alcohol consumption, and sexually transmitted infections.    GENERAL RECOMMENDATIONS FOR GOOD HEALTH:  Healthy diet:  Eat a variety of foods, including fruit, vegetables, animal or vegetable protein, such as meat, fish, chicken, and eggs, or beans, lentils, tofu, and grains, such as rice.  Drink plenty of water daily.  Decrease saturated fat in the diet, avoid lots of red meat, processed foods, sweets, fast foods, and fried foods.  Exercise:  Aerobic exercise helps maintain good heart health. At least 30-40 minutes of moderate-intensity exercise is recommended. For example, a brisk walk that increases your heart rate and breathing. This should be done on most days of the week.   Find a type of exercise or a variety of exercises that you enjoy so that it becomes a part of your daily life.  Examples are running, walking, swimming, water aerobics, and biking.  For motivation and support, explore group exercise such as aerobic class, spin class, Zumba, Yoga,or  martial arts, etc.    Set exercise goals for yourself, such as a certain weight goal, walk or run in a race such as a 5k walk/run.  Speak to your primary care provider about exercise goals.  Disease prevention:  If you smoke or chew tobacco, find out  from your caregiver how to quit. It can literally save your life, no matter how long you have been a tobacco user. If you do not use tobacco, never begin.   Maintain a healthy diet and normal weight. Increased weight leads to problems with blood pressure and diabetes.   The Body Mass Index or BMI is a way of measuring how much of your body is fat. Having a BMI above 27 increases the risk of heart  disease, diabetes, hypertension, stroke and other problems related to obesity. Your caregiver can help determine your BMI and based on it develop an exercise and dietary program to help you achieve or maintain this important measurement at a healthful level.  High blood pressure causes heart and blood vessel problems.  Persistent high blood pressure should be treated with medicine if weight loss and exercise do not work.   Fat and cholesterol leaves deposits in your arteries that can block them. This causes heart disease and vessel disease elsewhere in your body.  If your cholesterol is found to be high, or if you have heart disease or certain other medical conditions, then you may need to have your cholesterol monitored frequently and be treated with medication.   Ask if you should have a cardiac stress test if your history suggests this. A stress test is a test done on a treadmill that looks for heart disease. This test can find disease prior to there being a problem.  Osteoporosis is a disease in which the bones lose minerals and strength as we age. This can result in serious bone fractures. Risk of osteoporosis can be identified using a bone density scan. Men ages 38 and over should discuss this with their caregivers. Ask your caregiver whether you should be taking a calcium supplement and Vitamin D, to reduce the rate of osteoporosis.   Avoid drinking alcohol in excess (more than two drinks per day).  Avoid use of street drugs. Do not share needles with anyone. Ask for professional help if you need assistance or instructions on stopping the use of alcohol, cigarettes, and/or drugs.  Brush your teeth twice a day with fluoride toothpaste, and floss once a day. Good oral hygiene prevents tooth decay and gum disease. The problems can be painful, unattractive, and can cause other health problems. Visit your dentist for a routine oral and dental check up and preventive care every 6-12 months.   Look at  your skin regularly.  Use a mirror to look at your back. Notify your caregivers of changes in moles, especially if there are changes in shapes, colors, a size larger than a pencil eraser, an irregular border, or development of new moles.  Safety:  Use seatbelts 100% of the time, whether driving or as a passenger.  Use safety devices such as hearing protection if you work in environments with loud noise or significant background noise.  Use safety glasses when doing any work that could send debris in to the eyes.  Use a helmet if you ride a bike or motorcycle.  Use appropriate safety gear for contact sports.  Talk to your caregiver about gun safety.  Use sunscreen with a SPF (or skin protection factor) of 15 or greater.  Lighter skinned people are at a greater risk of skin cancer. Don't forget to also wear sunglasses in order to protect your eyes from too much damaging sunlight. Damaging sunlight can accelerate cataract formation.   Practice safe sex. Use condoms. Condoms are used for birth control and  to help reduce the spread of sexually transmitted infections (or STIs).  Some of the STIs are gonorrhea (the clap), chlamydia, syphilis, trichomonas, herpes, HPV (human papilloma virus) and HIV (human immunodeficiency virus) which causes AIDS. The herpes, HIV and HPV are viral illnesses that have no cure. These can result in disability, cancer and death.   Keep carbon monoxide and smoke detectors in your home functioning at all times. Change the batteries every 6 months or use a model that plugs into the wall.   Vaccinations:  Stay up to date with your tetanus shots and other required immunizations. You should have a booster for tetanus every 10 years. Be sure to get your flu shot every year, since 5%-20% of the [place] population comes down with the flu. The flu vaccine changes each year, so being vaccinated once is not enough. Get your shot in the fall, before the flu season peaks.   Other vaccines to  consider:  Human Papilloma Virus or HPV causes cancer of the cervix, and other infections that can be transmitted from person to person. There is a vaccine for HPV, and males should get immunized between the ages of 68 and 69. It requires a series of 3 shots.   Pneumococcal vaccine to protect against certain types of pneumonia.  This is normally recommended for adults age 76 or older.  However, adults younger than 68 years old with certain underlying conditions such as diabetes, heart or lung disease should also receive the vaccine.  Shingles vaccine to protect against Varicella Zoster if you are older than age 42, or younger than 68 years old with certain underlying illness.  If you have not had the Shingrix vaccine, please call your insurer to inquire about coverage for the Shingrix vaccine given in 2 doses.   Some insurers cover this vaccine after age 43, some cover this after age 73.  If your insurer covers this, then call to schedule appointment to have this vaccine here  Hepatitis A vaccine to protect against a form of infection of the liver by a virus acquired from food.  Hepatitis B vaccine to protect against a form of infection of the liver by a virus acquired from blood or body fluids, particularly if you work in health care.  If you plan to travel internationally, check with your local health department for specific vaccination recommendations.   What should I know about cancer screening? Many types of cancers can be detected early and may often be prevented. Lung Cancer  You should be screened every year for lung cancer if: ? You are a current smoker who has smoked for at least 30 years. ? You are a former smoker who has quit within the past 15 years.  Talk to your health care provider about your screening options, when you should start screening, and how often you should be screened.  Colorectal Cancer  Routine colorectal cancer screening usually begins at 68 years of age and  should be repeated every 5-10 years until you are 68 years old. You may need to be screened more often if early forms of precancerous polyps or small growths are found. Your health care provider may recommend screening at an earlier age if you have risk factors for colon cancer.  Your health care provider may recommend using home test kits to check for hidden blood in the stool.  A small camera at the end of a tube can be used to examine your colon (sigmoidoscopy or colonoscopy). This checks for  the earliest forms of colorectal cancer.  Prostate and Testicular Cancer  Depending on your age and overall health, your health care provider may do certain tests to screen for prostate and testicular cancer.  Talk to your health care provider about any symptoms or concerns you have about testicular or prostate cancer.  Skin Cancer  Check your skin from head to toe regularly.  Tell your health care provider about any new moles or changes in moles, especially if: ? There is a change in a mole's size, shape, or color. ? You have a mole that is larger than a pencil eraser.  Always use sunscreen. Apply sunscreen liberally and repeat throughout the day.  Protect yourself by wearing long sleeves, pants, a wide-brimmed hat, and sunglasses when outside.

## 2018-04-04 ENCOUNTER — Other Ambulatory Visit: Payer: Medicare HMO

## 2018-04-04 DIAGNOSIS — K2270 Barrett's esophagus without dysplasia: Secondary | ICD-10-CM

## 2018-04-04 DIAGNOSIS — E7800 Pure hypercholesterolemia, unspecified: Secondary | ICD-10-CM

## 2018-04-04 DIAGNOSIS — E669 Obesity, unspecified: Secondary | ICD-10-CM | POA: Diagnosis not present

## 2018-04-04 DIAGNOSIS — N400 Enlarged prostate without lower urinary tract symptoms: Secondary | ICD-10-CM

## 2018-04-04 DIAGNOSIS — Z8249 Family history of ischemic heart disease and other diseases of the circulatory system: Secondary | ICD-10-CM | POA: Diagnosis not present

## 2018-04-04 DIAGNOSIS — Z131 Encounter for screening for diabetes mellitus: Secondary | ICD-10-CM | POA: Diagnosis not present

## 2018-04-04 NOTE — Telephone Encounter (Signed)
error 

## 2018-04-04 NOTE — Addendum Note (Signed)
Addended by: Carlena Hurl on: 04/04/2018 09:21 AM   Modules accepted: Orders

## 2018-04-05 ENCOUNTER — Other Ambulatory Visit: Payer: Self-pay

## 2018-04-05 DIAGNOSIS — K635 Polyp of colon: Secondary | ICD-10-CM

## 2018-04-05 LAB — HEMOGLOBIN A1C
Est. average glucose Bld gHb Est-mCnc: 108 mg/dL
Hgb A1c MFr Bld: 5.4 % (ref 4.8–5.6)

## 2018-04-05 LAB — CBC
Hematocrit: 41.3 % (ref 37.5–51.0)
Hemoglobin: 14.2 g/dL (ref 13.0–17.7)
MCH: 30.7 pg (ref 26.6–33.0)
MCHC: 34.4 g/dL (ref 31.5–35.7)
MCV: 89 fL (ref 79–97)
Platelets: 208 10*3/uL (ref 150–450)
RBC: 4.62 x10E6/uL (ref 4.14–5.80)
RDW: 13.1 % (ref 12.3–15.4)
WBC: 6.5 10*3/uL (ref 3.4–10.8)

## 2018-04-05 LAB — COMPREHENSIVE METABOLIC PANEL
ALT: 14 IU/L (ref 0–44)
AST: 14 IU/L (ref 0–40)
Albumin/Globulin Ratio: 2 (ref 1.2–2.2)
Albumin: 4.5 g/dL (ref 3.6–4.8)
Alkaline Phosphatase: 73 IU/L (ref 39–117)
BUN/Creatinine Ratio: 12 (ref 10–24)
BUN: 13 mg/dL (ref 8–27)
Bilirubin Total: 0.6 mg/dL (ref 0.0–1.2)
CO2: 26 mmol/L (ref 20–29)
Calcium: 9.2 mg/dL (ref 8.6–10.2)
Chloride: 105 mmol/L (ref 96–106)
Creatinine, Ser: 1.11 mg/dL (ref 0.76–1.27)
GFR calc Af Amer: 78 mL/min/{1.73_m2} (ref >59)
GFR calc non Af Amer: 68 mL/min/{1.73_m2} (ref >59)
Globulin, Total: 2.2 g/dL (ref 1.5–4.5)
Glucose: 101 mg/dL — ABNORMAL HIGH (ref 65–99)
Potassium: 4.4 mmol/L (ref 3.5–5.2)
Sodium: 143 mmol/L (ref 134–144)
Total Protein: 6.7 g/dL (ref 6.0–8.5)

## 2018-04-05 LAB — LIPID PANEL
Chol/HDL Ratio: 4.2 ratio (ref 0.0–5.0)
Cholesterol, Total: 196 mg/dL (ref 100–199)
HDL: 47 mg/dL (ref >39)
LDL Calculated: 127 mg/dL — ABNORMAL HIGH (ref 0–99)
Triglycerides: 109 mg/dL (ref 0–149)
VLDL Cholesterol Cal: 22 mg/dL (ref 5–40)

## 2018-04-05 LAB — PSA: Prostate Specific Ag, Serum: 3.8 ng/mL (ref 0.0–4.0)

## 2018-04-05 NOTE — Telephone Encounter (Signed)
Pt has been referred for Cologuard

## 2018-04-10 ENCOUNTER — Encounter: Payer: Self-pay | Admitting: Medical

## 2018-04-12 ENCOUNTER — Other Ambulatory Visit: Payer: Self-pay | Admitting: Medical

## 2018-04-12 MED ORDER — TERAZOSIN HCL 10 MG PO CAPS: ORAL_CAPSULE | ORAL | 3 refills | Status: DC

## 2018-04-13 ENCOUNTER — Other Ambulatory Visit: Payer: Self-pay | Admitting: Medical

## 2018-04-13 MED ORDER — PRAVASTATIN SODIUM 20 MG PO TABS: 20.0000 mg | ORAL_TABLET | Freq: Every evening | ORAL | 0 refills | Status: DC

## 2018-04-18 ENCOUNTER — Telehealth: Payer: Self-pay | Admitting: Medical

## 2018-04-18 ENCOUNTER — Encounter: Payer: Self-pay | Admitting: Medical

## 2018-04-18 NOTE — Telephone Encounter (Signed)
Received note from [employer], need insurance info added.   I have gotten a few of these.  Make sure to add insurance info and send back.  Make sure to leave note for Cyndi to always add insurance info with referral

## 2018-04-19 NOTE — Telephone Encounter (Signed)
Insurance info has been faxed over.

## 2018-04-26 ENCOUNTER — Telehealth: Payer: Self-pay

## 2018-04-26 NOTE — Telephone Encounter (Signed)
Pt called and stated insurance will not cover his cologuard because the ICD-10 code is not correct. He stated ICD-10 should be Z12.11 or Z12.12

## 2018-04-27 ENCOUNTER — Other Ambulatory Visit: Payer: Self-pay

## 2018-04-27 DIAGNOSIS — Z5320 Procedure and treatment not carried out because of patient's decision for unspecified reasons: Secondary | ICD-10-CM | POA: Insufficient documentation

## 2018-04-27 DIAGNOSIS — Z1211 Encounter for screening for malignant neoplasm of colon: Secondary | ICD-10-CM

## 2018-04-27 NOTE — Telephone Encounter (Signed)
Done reorder with DX z12.11, z53.20

## 2018-04-27 NOTE — Telephone Encounter (Signed)
Resubmit with the new codes on the paper

## 2018-05-10 DIAGNOSIS — Z5320 Procedure and treatment not carried out because of patient's decision for unspecified reasons: Secondary | ICD-10-CM | POA: Diagnosis not present

## 2018-05-10 DIAGNOSIS — Z1211 Encounter for screening for malignant neoplasm of colon: Secondary | ICD-10-CM | POA: Diagnosis not present

## 2018-05-11 ENCOUNTER — Telehealth: Payer: Self-pay | Admitting: Medical

## 2018-05-11 NOTE — Telephone Encounter (Signed)
Requested for vaccines sent to [facility]. They sent request back stating they had no records of any vaccines given.

## 2018-05-14 LAB — COLOGUARD: Cologuard: NEGATIVE

## 2018-05-15 ENCOUNTER — Encounter: Payer: Self-pay | Admitting: Medical

## 2018-05-16 ENCOUNTER — Telehealth: Payer: Self-pay | Admitting: Medical

## 2018-05-16 NOTE — Telephone Encounter (Signed)
Please let them know that the Cologuard screening for colon cancer was negative.  This indicates a lower likelihood that colorectal cancer is present.   Lets plan to repeat this in 3 years.  However, if the develop bowel changes, blood in stool, unexpected weight loss, or other new bowel changes, then recheck. 

## 2018-05-16 NOTE — Telephone Encounter (Signed)
Patient notified of Cologuard Screening.

## 2018-05-18 DIAGNOSIS — R69 Illness, unspecified: Secondary | ICD-10-CM | POA: Diagnosis not present

## 2018-05-22 ENCOUNTER — Telehealth: Payer: Self-pay | Admitting: Medical

## 2018-05-22 NOTE — Telephone Encounter (Signed)
Pt filled out a medical records request stating he had received vaccines at [facility] [place]. Called and spoke to [facility] today and they state pt has never had any immunizations with them.

## 2018-05-22 NOTE — Telephone Encounter (Signed)
Please see if he has another pharmacy in mind where this was done?

## 2018-05-24 NOTE — Telephone Encounter (Signed)
Here are the current recommendations vaccines:  Td tetanus diptheria booster (every 10 years) Yearly flu shot Shingrix, 2 doses 2 months apart  Pneumococcal Prevnar 13, then pneumococcal 23 a year later  If insurance covers these, he can be scheduled here for all of these or can get at pharmacy if insurance requires

## 2018-05-24 NOTE — Telephone Encounter (Signed)
Called pt and he states he only remembers getting a flu shot at a pharmacy years ago. He is not sure where. To his knowledge no other vaccines received anywhere else.

## 2018-06-08 NOTE — Telephone Encounter (Signed)
Pt has been informed of providers note. He stated he had 2 pneumonia shots. He stated he will give [place] a call back about the immunizations.

## 2018-06-21 DIAGNOSIS — R69 Illness, unspecified: Secondary | ICD-10-CM | POA: Diagnosis not present

## 2018-06-24 DIAGNOSIS — R69 Illness, unspecified: Secondary | ICD-10-CM | POA: Diagnosis not present

## 2018-06-29 DIAGNOSIS — R69 Illness, unspecified: Secondary | ICD-10-CM | POA: Diagnosis not present

## 2018-07-08 ENCOUNTER — Other Ambulatory Visit: Payer: Self-pay | Admitting: Medical

## 2018-07-13 ENCOUNTER — Other Ambulatory Visit: Payer: Self-pay | Admitting: Medical

## 2018-07-18 ENCOUNTER — Ambulatory Visit: Payer: Self-pay | Admitting: Medical

## 2018-07-19 ENCOUNTER — Ambulatory Visit (INDEPENDENT_AMBULATORY_CARE_PROVIDER_SITE_OTHER): Payer: Medicare HMO | Admitting: Medical

## 2018-07-19 ENCOUNTER — Encounter: Payer: Self-pay | Admitting: Medical

## 2018-07-19 VITALS — BP 126/80 | HR 65 | Temp 98.0°F | Resp 16 | Ht 67.5 in | Wt 234.6 lb

## 2018-07-19 DIAGNOSIS — R42 Dizziness and giddiness: Secondary | ICD-10-CM | POA: Diagnosis not present

## 2018-07-19 DIAGNOSIS — Z23 Encounter for immunization: Secondary | ICD-10-CM | POA: Diagnosis not present

## 2018-07-19 DIAGNOSIS — E785 Hyperlipidemia, unspecified: Secondary | ICD-10-CM

## 2018-07-19 DIAGNOSIS — R490 Dysphonia: Secondary | ICD-10-CM | POA: Diagnosis not present

## 2018-07-19 MED ORDER — MECLIZINE HCL 25 MG PO TABS
25.0000 mg | ORAL_TABLET | Freq: Two times a day (BID) | ORAL | 0 refills | Status: DC
Start: 2018-07-19 — End: 2019-09-25

## 2018-07-19 NOTE — Progress Notes (Signed)
Subjective: Chief Complaint  Patient presents with  . follow up    follow up   Here for f/u.   At last visit we added Pravachol, and he has been compliant with this.  Here for recheck although he is not fasting today  He reports in recent weeks he has had some vertigo issues intermittent since last visit.   If he looks up gets vertigo, but not side to side.  No chest pain no palpitations no weakness no blurred vision no speech changes.  Had tooth extraction since last visit, had round of antibiotic.  Would like a flu shot today  Chronic hoarseness- he does not smoke cigarettes but does smoke marijuana, denies current allergy or congestion symptoms  Past Medical History:  Diagnosis Date  . Enlarged prostate    takes Terazosin daily  . Gastroparesis   . GERD (gastroesophageal reflux disease)    occasionally but no meds required  . History of colon polyps   . History of gastric ulcer   . Joint pain    knees and ankles  . Urinary frequency    Current Outpatient Medications on File Prior to Visit  Medication Sig Dispense Refill  . glucosamine-chondroitin 500-400 MG tablet Take 2 tablets by mouth 1 day or 1 dose.    . pravastatin (PRAVACHOL) 20 MG tablet TAKE 1 TABLET BY MOUTH EVERY DAY IN THE EVENING 30 tablet 0  . terazosin (HYTRIN) 10 MG capsule TAKE 1 CAPSULE BY MOUTH EVERYDAY AT BEDTIME 90 capsule 3  . omeprazole (PRILOSEC) 20 MG capsule Take by mouth.     No current facility-administered medications on file prior to visit.    ROS as in subjective   Objective: BP 126/80   Pulse 65   Temp 98 F (36.7 C) (Oral)   Resp 16   Ht 5' 7.5" (1.715 m)   Wt 234 lb 9.6 oz (106.4 kg)   SpO2 96%   BMI 36.20 kg/m   General appearance: alert, no distress, WD/WN,  HEENT: normocephalic, sclerae anicteric, TMs pearly, nares patent, no discharge or erythema, pharynx normal Oral cavity: MMM, no lesions Neck: supple, no lymphadenopathy, no thyromegaly, no masses Heart: RRR, normal  S1, S2, no murmurs Lungs: CTA bilaterally, no wheezes, rhonchi, or rales Neuro: CN2-12 intact, non focal exam Pulses: 2+ symmetric, upper and lower extremities, normal cap refill     Assessment: Encounter Diagnoses  Name Primary?  . Hyperlipidemia, unspecified hyperlipidemia type Yes  . Hoarseness   . Vertigo   . Need for influenza vaccination       Plan: Hyperlipidemia-compliant with medication, congratulated him on his weight loss in lifestyle changes since last visit, recommend he continue working towards further weight loss, he will return fasting one morning soon for lab  Chronic hoarseness-recommend ENT consult, he declines but will consider  Vertigo- discussed symptoms, possible treatment options, begin meclizine, and if not resolved within the next 2 weeks, refer to ENT for this and the chronic hoarseness  Counseled on the influenza virus vaccine.  Vaccine information sheet given.   High dose Influenza vaccine given after consent obtained.   Guy Evans was seen today for follow up.  Diagnoses and all orders for this visit:  Hyperlipidemia, unspecified hyperlipidemia type -     Lipid panel; Future  Hoarseness  Vertigo  Need for influenza vaccination -     Flu vaccine HIGH DOSE PF (Fluzone High Dose)  Other orders -     meclizine (ANTIVERT) 25 MG tablet; Take 1  tablet (25 mg total) by mouth 2 (two) times daily.

## 2018-07-23 ENCOUNTER — Other Ambulatory Visit: Payer: Self-pay | Admitting: Medical

## 2018-07-31 ENCOUNTER — Other Ambulatory Visit: Payer: Self-pay

## 2018-07-31 NOTE — Telephone Encounter (Signed)
Is this ok to refill?  

## 2018-08-02 MED ORDER — PRAVASTATIN SODIUM 20 MG PO TABS: ORAL_TABLET | ORAL | 3 refills | Status: DC

## 2019-02-06 DIAGNOSIS — L821 Other seborrheic keratosis: Secondary | ICD-10-CM | POA: Diagnosis not present

## 2019-02-06 DIAGNOSIS — D235 Other benign neoplasm of skin of trunk: Secondary | ICD-10-CM | POA: Diagnosis not present

## 2019-02-06 DIAGNOSIS — D2262 Melanocytic nevi of left upper limb, including shoulder: Secondary | ICD-10-CM | POA: Diagnosis not present

## 2019-02-06 DIAGNOSIS — D485 Neoplasm of uncertain behavior of skin: Secondary | ICD-10-CM | POA: Diagnosis not present

## 2019-02-06 DIAGNOSIS — C44519 Basal cell carcinoma of skin of other part of trunk: Secondary | ICD-10-CM | POA: Diagnosis not present

## 2019-02-06 DIAGNOSIS — L720 Epidermal cyst: Secondary | ICD-10-CM | POA: Diagnosis not present

## 2019-02-06 DIAGNOSIS — L814 Other melanin hyperpigmentation: Secondary | ICD-10-CM | POA: Diagnosis not present

## 2019-02-06 DIAGNOSIS — C44712 Basal cell carcinoma of skin of right lower limb, including hip: Secondary | ICD-10-CM | POA: Diagnosis not present

## 2019-02-06 DIAGNOSIS — L570 Actinic keratosis: Secondary | ICD-10-CM | POA: Diagnosis not present

## 2019-02-06 DIAGNOSIS — L853 Xerosis cutis: Secondary | ICD-10-CM | POA: Diagnosis not present

## 2019-02-06 DIAGNOSIS — L300 Nummular dermatitis: Secondary | ICD-10-CM | POA: Diagnosis not present

## 2019-02-06 DIAGNOSIS — Z85828 Personal history of other malignant neoplasm of skin: Secondary | ICD-10-CM | POA: Diagnosis not present

## 2019-03-26 DIAGNOSIS — R69 Illness, unspecified: Secondary | ICD-10-CM | POA: Diagnosis not present

## 2019-04-05 ENCOUNTER — Other Ambulatory Visit: Payer: Self-pay | Admitting: Medical

## 2019-04-05 NOTE — Telephone Encounter (Signed)
Is this ok to refill?  

## 2019-05-09 DIAGNOSIS — R69 Illness, unspecified: Secondary | ICD-10-CM | POA: Diagnosis not present

## 2019-07-05 ENCOUNTER — Other Ambulatory Visit: Payer: Self-pay | Admitting: Medical

## 2019-07-09 ENCOUNTER — Other Ambulatory Visit: Payer: Self-pay | Admitting: Medical

## 2019-08-19 ENCOUNTER — Other Ambulatory Visit: Payer: Self-pay | Admitting: Medical

## 2019-09-13 ENCOUNTER — Other Ambulatory Visit: Payer: Self-pay | Admitting: Medical

## 2019-09-18 ENCOUNTER — Ambulatory Visit (INDEPENDENT_AMBULATORY_CARE_PROVIDER_SITE_OTHER): Payer: Medicare HMO | Admitting: Medical

## 2019-09-18 ENCOUNTER — Other Ambulatory Visit: Payer: Self-pay

## 2019-09-18 ENCOUNTER — Encounter: Payer: Self-pay | Admitting: Medical

## 2019-09-18 VITALS — BP 120/70 | HR 60 | Temp 97.7°F | Ht 68.0 in | Wt 235.4 lb

## 2019-09-18 DIAGNOSIS — Z0000 Encounter for general adult medical examination without abnormal findings: Secondary | ICD-10-CM | POA: Diagnosis not present

## 2019-09-18 DIAGNOSIS — Z8249 Family history of ischemic heart disease and other diseases of the circulatory system: Secondary | ICD-10-CM | POA: Diagnosis not present

## 2019-09-18 DIAGNOSIS — K635 Polyp of colon: Secondary | ICD-10-CM | POA: Diagnosis not present

## 2019-09-18 DIAGNOSIS — Z7189 Other specified counseling: Secondary | ICD-10-CM | POA: Diagnosis not present

## 2019-09-18 DIAGNOSIS — N400 Enlarged prostate without lower urinary tract symptoms: Secondary | ICD-10-CM | POA: Diagnosis not present

## 2019-09-18 DIAGNOSIS — Z1159 Encounter for screening for other viral diseases: Secondary | ICD-10-CM | POA: Diagnosis not present

## 2019-09-18 DIAGNOSIS — Z7185 Encounter for immunization safety counseling: Secondary | ICD-10-CM

## 2019-09-18 DIAGNOSIS — E785 Hyperlipidemia, unspecified: Secondary | ICD-10-CM

## 2019-09-18 DIAGNOSIS — Z125 Encounter for screening for malignant neoplasm of prostate: Secondary | ICD-10-CM | POA: Diagnosis not present

## 2019-09-18 DIAGNOSIS — K2270 Barrett's esophagus without dysplasia: Secondary | ICD-10-CM | POA: Diagnosis not present

## 2019-09-18 DIAGNOSIS — K649 Unspecified hemorrhoids: Secondary | ICD-10-CM | POA: Diagnosis not present

## 2019-09-18 NOTE — Progress Notes (Signed)
Subjective:    Guy Evans is a 69 y.o. male who presents for Preventative Services visit and chronic medical problems/med check visit.    Primary Care Provider Tysinger, Camelia Eng, PA-C here for primary care  Current Health Care Team:  Dentist, Dr. Leonides Sake  Eye doctor, N/A  [facility], Dr. Fontaine No  Dr. Coralie Keens, orthopedics  Dr. Arta Silence, [place] you may have received from other than [facility] providers in the past year (date may be approximate) dermatology  Exercise Current exercise habits: The patient does not participate in regular exercise at present.   Nutrition/Diet Current diet: well balanced  Depression Screen Depression screen [facility] 2/9 09/18/2019  Decreased Interest 0  Down, Depressed, Hopeless 0  PHQ - 2 Score 0  Altered sleeping -  Tired, decreased energy -  Change in appetite -  Feeling bad or failure about yourself  -  Trouble concentrating -  Moving slowly or fidgety/restless -  PHQ-9 Score -    Activities of Daily Living Screen/Functional Status Survey Is the patient deaf or have difficulty hearing?: No Does the patient have difficulty seeing, even when wearing glasses/contacts?: No Does the patient have difficulty concentrating, remembering, or making decisions?: Yes Does the patient have difficulty walking or climbing stairs?: No Does the patient have difficulty dressing or bathing?: No Does the patient have difficulty doing errands alone such as visiting a doctor's office or shopping?: No  Can patient draw a clock face showing 3:15 oclock, yes  Fall Risk Screen Fall Risk  09/18/2019 04/03/2018 10/18/2016 10/18/2016  Falls in the past year? 0 No No No    Gait Assessment: Normal gait observed - yes  Advanced directives Does patient have a [place]? Yes Does patient have a Living Will? Yes  Past Medical History:  Diagnosis Date  . Enlarged prostate    takes Terazosin daily  .  Gastroparesis   . GERD (gastroesophageal reflux disease)    occasionally but no meds required  . History of colon polyps   . History of gastric ulcer   . Joint pain    knees and ankles  . Urinary frequency     Past Surgical History:  Procedure Laterality Date  . CHOLECYSTECTOMY N/A 11/19/2013   Procedure: LAPAROSCOPIC CHOLECYSTECTOMY;  Surgeon: Harl Bowie, MD;  Location: [facility];  Service: General;  Laterality: N/A;  . COLONOSCOPY    . ESOPHAGOGASTRODUODENOSCOPY    . skin spots removed and tested     done in the office    Social History   Socioeconomic History  . Marital status: Married    Spouse name: Not on file  . Number of children: Not on file  . Years of education: Not on file  . Highest education level: Not on file  Occupational History  . Not on file  Tobacco Use  . Smoking status: Former Research scientist (life sciences)  . Smokeless tobacco: Never Used  . Tobacco comment: quit smoking around 1993  Substance and Sexual Activity  . Alcohol use: Yes    Alcohol/week: 8.0 standard drinks    Types: 4 Shots of liquor, 4 Glasses of wine per week    Comment: none since gallbladder issues  . Drug use: No  . Sexual activity: Yes  Other Topics Concern  . Not on file  Social History Narrative   Not exercising.  Married.   Has 2 children, 2 grandchildren, one child [place], [place], one in [place], [place].   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 Difficulty of Paying Living Expenses: Not on file  Food Insecurity:   . Worried About Charity fundraiser in the Last Year: Not on file  . Ran Out of Food in the Last Year: Not on file  Transportation Needs:   . Lack of Transportation (Medical): Not on file  . Lack of Transportation (Non-Medical): Not on file  Physical Activity:   . Days of Exercise per Week: Not on file  . Minutes of Exercise per Session: Not on file  Stress:   . Feeling of Stress : Not on file  Social  Connections:   . Frequency of Communication with Friends and Family: Not on file  . Frequency of Social Gatherings with Friends and Family: Not on file  . Attends Religious Services: Not on file  . Active Member of Clubs or Organizations: Not on file  . Attends Archivist Meetings: Not on file  . Marital Status: Not on file  Intimate Partner Violence:   . Fear of Current or Ex-Partner: Not on file  . Emotionally Abused: Not on file  . Physically Abused: Not on file  . Sexually Abused: Not on file    Family History  Problem Relation Age of Onset  . Dementia Mother   . Heart disease Father 61       MI at 10yo, angina  . Obesity Father   . Cancer Neg Hx   . Stroke Neg Hx   . Diabetes Neg Hx      Current Outpatient Medications:  .  glucosamine-chondroitin 500-400 MG tablet, Take 2 tablets by mouth 1 day or 1 dose., Disp: , Rfl:  .  omeprazole (PRILOSEC) 20 MG capsule, Take by mouth., Disp: , Rfl:  .  pravastatin (PRAVACHOL) 20 MG tablet, TAKE 1 TABLET BY MOUTH EVERY DAY IN THE EVENING, Disp: 30 tablet, Rfl: 0 .  terazosin (HYTRIN) 10 MG capsule, TAKE 1 CAPSULE BY MOUTH EVERYDAY AT BEDTIME, Disp: 90 capsule, Rfl: 0 .  meclizine (ANTIVERT) 25 MG tablet, Take 1 tablet (25 mg total) by mouth 2 (two) times daily. (Patient not taking: Reported on 09/18/2019), Disp: 30 tablet, Rfl: 0  No Known Allergies  History reviewed: allergies, current medications, past family history, past medical history, past social history, past surgical history and problem list  Chronic issues discussed: He is compliant with Terazosin for prostate, pravastatin for cholesterol and Prilosec 20 mg daily for acid reflux and history of Barrett's esophagus  Acute issues discussed: none  Objective:      Biometrics BP 120/70   Pulse 60   Temp 97.7 F (36.5 C)   Ht 5\' 8"  (1.727 m)   Wt 235 lb 6.4 oz (106.8 kg)   SpO2 95%   BMI 35.79 kg/m    Wt Readings from Last 3 Encounters:  09/18/19 235 lb  6.4 oz (106.8 kg)  07/19/18 234 lb 9.6 oz (106.4 kg)  04/03/18 243 lb 3.2 oz (110.3 kg)     Cognitive Testing  Alert? Yes  Normal Appearance?Yes  Oriented to person? Yes  Place? Yes   Time? Yes  Recall of three objects?  Yes  Can perform simple calculations? Yes  Displays appropriate judgment?Yes  Can read the correct time from a watch face?Yes  General appearance: alert, no distress, WD/WN, caucasian male  Nutritional Status: Inadequate calore intake? no Loss of muscle mass? no Loss of fat beneath skin?  no Localized or general edema? no Diminished functional status? no  Other pertinent exam: SKin: Right ear superior crus with 3 mm diameter raised pinkish coloration lesion, neck just behind the ear on the left with similar pearly 3 mm raised pink lesion, otherwise scattered macules no other worrisome lesions HEENT: normocephalic, sclerae anicteric, TMs pearly, nares patent, no discharge or erythema, pharynx normal Oral cavity: MMM, no lesions Neck: supple, no lymphadenopathy, no thyromegaly, no masses Heart: RRR, normal S1, S2, no murmurs Lungs: CTA bilaterally, no wheezes, rhonchi, or rales Abdomen: +bs, soft, non tender, non distended, no masses, no hepatomegaly, no splenomegaly Musculoskeletal: nontender, no swelling, no obvious deformity Extremities: no edema, no cyanosis, no clubbing Pulses: 2+ symmetric, upper and lower extremities, normal cap refill Neurological: alert, oriented x 3, CN2-12 intact, strength normal upper extremities and lower extremities, sensation normal throughout, DTRs 2+ throughout, no cerebellar signs, gait normal Psychiatric: normal affect, behavior normal, pleasant  GU: Normal external genitalia, no hernia, no obvious mass no lymphadenopathy Rectal declined  Assessment:   Encounter Diagnoses  Name Primary?  . Encounter for health maintenance examination in adult Yes  . Medicare annual wellness visit, subsequent   . Benign prostatic  hyperplasia, unspecified whether lower urinary tract symptoms present   . Vaccine counseling   . Family history of premature CAD   . Class 2 severe obesity with serious comorbidity in adult, unspecified BMI, unspecified obesity type ([place])   . Hyperlipidemia, unspecified hyperlipidemia type   . Barrett's esophagus without dysplasia   . Hemorrhoids, unspecified hemorrhoid type   . Polyp of colon, unspecified part of colon, unspecified type   . Screening for prostate cancer   . Advanced directives, counseling/discussion      Plan:   A preventative services visit was completed today.  During the course of the visit today, we discussed and counseled about appropriate screening and preventive services.  A health risk assessment was established today that included a review of current medications, allergies, social history, family history, medical and preventative health history, biometrics, and preventative screenings to identify potential safety concerns or impairments.  A personalized plan was printed today for your records and use.   Personalized health advice and education was given today to reduce health risks and promote self management and wellness.  Information regarding end of life planning was discussed today.  Conditions/risks identified: BPH-continue Terazosin, PSA lab today.  Discussed risk and benefits of screening.  Hyperlipidemia-continue statin.  Labs today.  Consider other heart disease screening.  Barrett's esophagus-continue PPI, consider updating endoscopies  Obesity-needs to work on lifestyle changes and weight loss   Acute problems discussed today: none  Recommendations:  I recommend a yearly ophthalmology/optometry visit for glaucoma screening and eye checkup  I recommended a yearly dental visit for hygiene and checkup  Advanced directives - discussed nature and purpose of Advanced Directives, encouraged them to complete them if they have not done so and/or  encouraged them to get [place] a copy if they have done this already.   Referrals today: Consider cardiology consult for baseline evaluation/screening  Immunizations: He declines vaccines today.  We will try to get a copy of prior vaccine records from [facility].  According to our records he is not up-to-date but he notes having several vaccines at [facility].   Medicare Attestation A preventative services visit was completed today.  During the course of the visit the patient was educated and counseled about appropriate screening and preventive services.  A health risk assessment was established  with the patient that included a review of current medications, allergies, social history, family history, medical and preventative health history, biometrics, and preventative screenings to identify potential safety concerns or impairments.  A personalized plan was printed today for the patient's records and use.   Personalized health advice and education was given today to reduce health risks and promote self management and wellness.  Information regarding end of life planning was discussed today.  Dorothea Ogle, PA-C   09/18/2019

## 2019-09-18 NOTE — Addendum Note (Signed)
Addended by: Carlena Hurl on: 09/18/2019 12:06 PM   Modules accepted: Orders

## 2019-09-18 NOTE — Patient Instructions (Signed)
Preventative Care for Adults - Male    Thank you for coming in for your well visit today, and thank you for trusting [place] with your care!   Maintain regular health and wellness exams:  A routine yearly physical is a good way to check in with your primary care provider about your health and preventive screening. It is also an opportunity to share updates about your health and any concerns you have, and receive a thorough all-over exam.   Most health insurance companies pay for at least some preventative services.  Check with your health plan for specific coverages.  What preventative services do men need?  Adult men should have their weight and blood pressure checked regularly.   Men age 20 and older should have their cholesterol levels checked regularly.  Beginning at age 25 and continuing to age 60, men should be screened for colorectal cancer.  Certain people may need continued testing until age 47.  Updating vaccinations is part of preventative care.  Vaccinations help protect against diseases such as the flu.  Osteoporosis is a disease in which the bones lose minerals and strength as we age. Men ages 4 and over should discuss this with their caregivers  Lab tests are generally done as part of preventative care to screen for anemia and blood disorders, to screen for problems with the kidneys and liver, to screen for bladder problems, to check blood sugar, and to check your cholesterol level.  Preventative services generally include counseling about diet, exercise, avoiding tobacco, drugs, excessive alcohol consumption, and sexually transmitted infections.   Xrays and CT scans are not normally done as a preventative test, and most insurances do not pay for imaging for screening other than as discussed under cancer screens below.   On the other hand, if you have certain medical concerns, imaging may be necessary as a diagnostic test.    Your Medical Team Your medical team starts with  [place], your PCP or primary care provider.  Please use our services for your routine care such as physicals, screenings, immunizations, sick visits, and your first stop for general medical concerns.  You can call our number for after hours information for urgent questions that may need attention but cannot wait til the next business day.    Urgent care-urgent cares exist to provide care when your primary care office would typically be closed such as evenings or weekends.   Urgent care is for evaluation of urgent medical problems that do not necessarily require emergency department care, but cannot wait til the next business day when we are open.  Emergency department care-please reserve emergency department care for serious, urgent, possibly life-threatening medical problems.  This includes issues like possible stroke, heart attack, significant injury, mental health crisis, or other urgent need that requires immediate medical attention.     See your dentist office twice yearly for hygiene and cleaning visits.   Brush your teeth and floss your teeth daily.  See your eye doctor yearly for routine eye exam and screenings for glaucoma and retinal disease.   Specific Concerns: Establish with an eye doctor  Get [place] a copy of your advanced directives     Vaccines:  Stay up to date with your tetanus shots and other required immunizations. You should have a booster for tetanus every 10 years. Be sure to get your flu shot every year, since 5%-20% of the [place] population comes down with the flu. The flu vaccine changes each year, so being vaccinated once  is not enough. Get your shot in the fall, before the flu season peaks.   Other vaccines to consider:  Pneumococcal vaccine to protect against certain types of pneumonia.  This is normally recommended for adults age 34 or older.  However, adults younger than 70 years old with certain underlying conditions such as diabetes, heart or lung disease should also  receive the vaccine.  Shingles vaccine to protect against Varicella Zoster if you are older than age 59, or younger than 70 years old with certain underlying illness.  If you have not had the Shingrix vaccine, please call your insurer to inquire about coverage for the Shingrix vaccine given in 2 doses.   Some insurers cover this vaccine after age 53, some cover this after age 26.  If your insurer covers this, then call to schedule appointment to have this vaccine here  Hepatitis A vaccine to protect against a form of infection of the liver by a virus acquired from food.  Hepatitis B vaccine to protect against a form of infection of the liver by a virus acquired from blood or body fluids, particularly if you work in health care.  If you plan to travel internationally, check with your local health department for specific vaccination recommendations.  Human Papilloma Virus or HPV causes cancer of the cervix, and other infections that can be transmitted from person to person. There is a vaccine for HPV, and males should get immunized between the ages of 69 and 33. It requires a series of 3 shots.   Covid/Coronavirus - as the vaccines are becoming available, please consider vaccination if you are a health care worker, first responder, or have significant health problems such as asthma, COPD, heart disease, hypertension, diabetes, obesity, multiple medical problems, over age 11yo, or immunocompromised.     I do not have any vaccine records on file other than flu shot.  We will have you sign for copies of vaccine [facility] today.    What should I know about Cancer screening? Many types of cancers can be detected early and may often be prevented. Lung Cancer  You should be screened every year for lung cancer if: ? You are a current smoker who has smoked for at least 30 years. ? You are a former smoker who has quit within the past 15 years.  Talk to your health care provider about your  screening options, when you should start screening, and how often you should be screened.  Colorectal Cancer  Routine colorectal cancer screening usually begins at 70 years of age and should be repeated every 5-10 years until you are 70 years old. You may need to be screened more often if early forms of precancerous polyps or small growths are found. Your health care provider may recommend screening at an earlier age if you have risk factors for colon cancer.  Your health care provider may recommend using home test kits to check for hidden blood in the stool.  A small camera at the end of a tube can be used to examine your colon (sigmoidoscopy or colonoscopy). This checks for the earliest forms of colorectal cancer.  Prostate and Testicular Cancer  Depending on your age and overall health, your health care provider may do certain tests to screen for prostate and testicular cancer.  Talk to your health care provider about any symptoms or concerns you have about testicular or prostate cancer.  Skin Cancer  Check your skin from head to toe regularly.  Tell  your health care provider about any new moles or changes in moles, especially if: ? There is a change in a mole's size, shape, or color. ? You have a mole that is larger than a pencil eraser.  Always use sunscreen. Apply sunscreen liberally and repeat throughout the day.  Protect yourself by wearing long sleeves, pants, a wide-brimmed hat, and sunglasses when outside.   Are you up to date on cancer screenings:  COLON CANCER SCREENING:  Yes, cologard negative 2019  PROSTATE CANCER SCREENING:  today     GENERAL RECOMMENDATIONS FOR GOOD HEALTH:  Healthy diet:  Eat a variety of foods, including fruit, vegetables, animal or vegetable protein, such as meat, fish, chicken, and eggs, or beans, lentils, tofu, and grains, such as rice.  Drink plenty of water daily.  Decrease saturated fat in the diet, avoid lots of red meat, processed  foods, sweets, fast foods, and fried foods.  Exercise:  Aerobic exercise helps maintain good heart health. At least 30-40 minutes of moderate-intensity exercise is recommended. For example, a brisk walk that increases your heart rate and breathing. This should be done on most days of the week.   Find a type of exercise or a variety of exercises that you enjoy so that it becomes a part of your daily life.  Examples are running, walking, swimming, water aerobics, and biking.  For motivation and support, explore group exercise such as aerobic class, spin class, Zumba, Yoga,or  martial arts, etc.    Set exercise goals for yourself, such as a certain weight goal, walk or run in a race such as a 5k walk/run.  Speak to your primary care provider about exercise goals.  Your weight readings per our records: Wt Readings from Last 3 Encounters:  09/18/19 235 lb 6.4 oz (106.8 kg)  07/19/18 234 lb 9.6 oz (106.4 kg)  04/03/18 243 lb 3.2 oz (110.3 kg)    Body mass index is 35.79 kg/m.    Disease prevention:  If you smoke or chew tobacco, find out from your caregiver how to quit. It can literally save your life, no matter how long you have been a tobacco user. If you do not use tobacco, never begin.   Maintain a healthy diet and normal weight. Increased weight leads to problems with blood pressure and diabetes.   The Body Mass Index or BMI is a way of measuring how much of your body is fat. Having a BMI above 27 increases the risk of heart disease, diabetes, hypertension, stroke and other problems related to obesity. Your caregiver can help determine your BMI and based on it develop an exercise and dietary program to help you achieve or maintain this important measurement at a healthful level.  High blood pressure causes heart and blood vessel problems.  Persistent high blood pressure should be treated with medicine if weight loss and exercise do not work.  Your blood pressure readings per our  records:     BP Readings from Last 3 Encounters:  09/18/19 120/70  07/19/18 126/80  04/03/18 122/70     Fat and cholesterol leaves deposits in your arteries that can block them. This causes heart disease and vessel disease elsewhere in your body.  If your cholesterol is found to be high, or if you have heart disease or certain other medical conditions, then you may need to have your cholesterol monitored frequently and be treated with medication.   Ask if you should have a cardiac stress test if your history  suggests this. A stress test is a test done on a treadmill that looks for heart disease. This test can find disease prior to there being a problem.    Heart disease screening:  EKG 2019.  It may be worth having a baseline stress test or consult with   cardiology for screening.  Let me know if you want to pursue this.   Osteoporosis is a disease in which the bones lose minerals and strength as we age. This can result in serious bone fractures. Risk of osteoporosis can be identified using a bone density scan. Men ages 50 and over should discuss this with their caregivers. Ask your caregiver whether you should be taking a calcium supplement and Vitamin D, to reduce the rate of osteoporosis.   Avoid drinking alcohol in excess (more than two drinks per day).  Avoid use of street drugs. Do not share needles with anyone. Ask for professional help if you need assistance or instructions on stopping the use of alcohol, cigarettes, and/or drugs.  Brush your teeth twice a day with fluoride toothpaste, and floss once a day. Good oral hygiene prevents tooth decay and gum disease. The problems can be painful, unattractive, and can cause other health problems. Visit your dentist for a routine oral and dental check up and preventive care every 6-12 months.   Safety:  Use seatbelts 100% of the time, whether driving or as a passenger.  Use safety devices such as hearing protection if you work in  environments with loud noise or significant background noise.  Use safety glasses when doing any work that could send debris in to the eyes.  Use a helmet if you ride a bike or motorcycle.  Use appropriate safety gear for contact sports.  Talk to your caregiver about gun safety.  Use sunscreen with a SPF (or skin protection factor) of 15 or greater.  Lighter skinned people are at a greater risk of skin cancer. Don't forget to also wear sunglasses in order to protect your eyes from too much damaging sunlight. Damaging sunlight can accelerate cataract formation.   Keep carbon monoxide and smoke detectors in your home functioning at all times. Change the batteries every 6 months or use a model that plugs into the wall.    Sexual activity: . Sex is a normal part of life and sexual activity can continue into older adulthood for many healthy people.   . If you are having erectile dysfunction issues, please follow up to discuss this further.   . If you are not in a monogamous relationship or have more than one partner, please practice safe sex.  Use condoms. Condoms are used for birth control and to help reduce the spread of sexually transmitted infections (or STIs).  Some of the STIs are gonorrhea (the clap), chlamydia, syphilis, trichomonas, herpes, HPV (human papilloma virus) and HIV (human immunodeficiency virus) which causes AIDS. The herpes, HIV and HPV are viral illnesses that have no cure. These can result in disability, cancer and death.   We are able to test for STIs here at our office.

## 2019-09-19 ENCOUNTER — Other Ambulatory Visit: Payer: Self-pay | Admitting: Medical

## 2019-09-19 LAB — CBC WITH DIFFERENTIAL/PLATELET
Basophils Absolute: 0.1 10*3/uL (ref 0.0–0.2)
Basos: 1 %
EOS (ABSOLUTE): 0.2 10*3/uL (ref 0.0–0.4)
Eos: 3 %
Hematocrit: 45.6 % (ref 37.5–51.0)
Hemoglobin: 15.1 g/dL (ref 13.0–17.7)
Immature Grans (Abs): 0 10*3/uL (ref 0.0–0.1)
Immature Granulocytes: 0 %
Lymphocytes Absolute: 1.4 10*3/uL (ref 0.7–3.1)
Lymphs: 20 %
MCH: 30.6 pg (ref 26.6–33.0)
MCHC: 33.1 g/dL (ref 31.5–35.7)
MCV: 92 fL (ref 79–97)
Monocytes Absolute: 0.7 10*3/uL (ref 0.1–0.9)
Monocytes: 9 %
Neutrophils Absolute: 4.8 10*3/uL (ref 1.4–7.0)
Neutrophils: 67 %
Platelets: 248 10*3/uL (ref 150–450)
RBC: 4.94 x10E6/uL (ref 4.14–5.80)
RDW: 13.1 % (ref 11.6–15.4)
WBC: 7.1 10*3/uL (ref 3.4–10.8)

## 2019-09-19 LAB — COMPREHENSIVE METABOLIC PANEL
ALT: 12 IU/L (ref 0–44)
AST: 13 IU/L (ref 0–40)
Albumin/Globulin Ratio: 2 (ref 1.2–2.2)
Albumin: 4.5 g/dL (ref 3.8–4.8)
Alkaline Phosphatase: 84 IU/L (ref 39–117)
BUN/Creatinine Ratio: 13 (ref 10–24)
BUN: 14 mg/dL (ref 8–27)
Bilirubin Total: 0.5 mg/dL (ref 0.0–1.2)
CO2: 27 mmol/L (ref 20–29)
Calcium: 9.6 mg/dL (ref 8.6–10.2)
Chloride: 102 mmol/L (ref 96–106)
Creatinine, Ser: 1.08 mg/dL (ref 0.76–1.27)
GFR calc Af Amer: 81 mL/min/{1.73_m2} (ref >59)
GFR calc non Af Amer: 70 mL/min/{1.73_m2} (ref >59)
Globulin, Total: 2.2 g/dL (ref 1.5–4.5)
Glucose: 95 mg/dL (ref 65–99)
Potassium: 4.8 mmol/L (ref 3.5–5.2)
Sodium: 141 mmol/L (ref 134–144)
Total Protein: 6.7 g/dL (ref 6.0–8.5)

## 2019-09-19 LAB — HEPATITIS C ANTIBODY: Hep C Virus Ab: <0.1 s/co ratio (ref 0.0–0.9)

## 2019-09-19 LAB — LIPID PANEL
Chol/HDL Ratio: 3.4 ratio (ref 0.0–5.0)
Cholesterol, Total: 171 mg/dL (ref 100–199)
HDL: 51 mg/dL (ref >39)
LDL Chol Calc (NIH): 104 mg/dL — ABNORMAL HIGH (ref 0–99)
Triglycerides: 84 mg/dL (ref 0–149)
VLDL Cholesterol Cal: 16 mg/dL (ref 5–40)

## 2019-09-19 LAB — PSA: Prostate Specific Ag, Serum: 4.3 ng/mL — ABNORMAL HIGH (ref 0.0–4.0)

## 2019-09-19 MED ORDER — TERAZOSIN HCL 10 MG PO CAPS: 10.0000 mg | ORAL_CAPSULE | Freq: Every day | ORAL | 3 refills | Status: DC

## 2019-09-19 MED ORDER — PRAVASTATIN SODIUM 20 MG PO TABS: 20.0000 mg | ORAL_TABLET | Freq: Every day | ORAL | 3 refills | Status: DC

## 2019-09-20 ENCOUNTER — Other Ambulatory Visit: Payer: Self-pay | Admitting: Medical

## 2019-09-20 DIAGNOSIS — E785 Hyperlipidemia, unspecified: Secondary | ICD-10-CM

## 2019-09-20 DIAGNOSIS — Z8249 Family history of ischemic heart disease and other diseases of the circulatory system: Secondary | ICD-10-CM

## 2019-09-24 NOTE — Progress Notes (Signed)
Cardiology Office Consult Note    Date:  09/25/2019   ID:  Guy Evans, DOB 03-21-1951, MRN [id number]  PCP:  Carlena Hurl, PA-C  Cardiologist:  Fransico Him, MD   Chief Complaint  Patient presents with  . New Patient (Initial Visit)    family hx of CAD    History of Present Illness:  Guy Evans is a 69 y.o. male who is being seen today for the evaluation of family hx of CAD at the request of Carlena Hurl, PA-C.  This is a 69yo male with a hx of GERD with gastroparesis and family hx of premature CAD with his father being dx with CAD in his 11's.  He has a remote hx of tobacco use. He  denies any chest pain or pressure, SOB, DOE, PND, orthopnea, LE edema, dizziness, palpitations or syncope. He is compliant with his meds and is tolerating meds with no SE.    Past Medical History:  Diagnosis Date  . Enlarged prostate    takes Terazosin daily  . Gastroparesis   . GERD (gastroesophageal reflux disease)    occasionally but no meds required  . History of colon polyps   . History of gastric ulcer   . Joint pain    knees and ankles  . Urinary frequency     Past Surgical History:  Procedure Laterality Date  . CHOLECYSTECTOMY N/A 11/19/2013   Procedure: LAPAROSCOPIC CHOLECYSTECTOMY;  Surgeon: Harl Bowie, MD;  Location: [facility];  Service: General;  Laterality: N/A;  . COLONOSCOPY    . ESOPHAGOGASTRODUODENOSCOPY    . skin spots removed and tested     done in the office    Current Medications: Current Meds  Medication Sig  . Coenzyme Q10 (COQ10) 100 MG CAPS Take 100 mg by mouth daily.  . Gluc-Chonn-MSM-Boswellia-Vit D (GLUCOSAMINE CHONDROITIN + D3 PO) Take 50 mcg by mouth daily.  . pravastatin (PRAVACHOL) 20 MG tablet Take 1 tablet (20 mg total) by mouth daily.  . pseudoephedrine-acetaminophen (TYLENOL SINUS) 30-500 MG TABS tablet Take 1 tablet by mouth every 4 (four) hours as needed.  . terazosin (HYTRIN) 10 MG capsule Take 1 capsule (10 mg total) by mouth  daily.  . Turmeric (QC TUMERIC COMPLEX PO) Take 1,000 mg by mouth daily.    Allergies:   Patient has no known allergies.   Social History   Socioeconomic History  . Marital status: Married    Spouse name: Not on file  . Number of children: Not on file  . Years of education: Not on file  . Highest education level: Not on file  Occupational History  . Not on file  Tobacco Use  . Smoking status: Former Research scientist (life sciences)  . Smokeless tobacco: Never Used  . Tobacco comment: quit smoking around 1993  Substance and Sexual Activity  . Alcohol use: Yes    Alcohol/week: 8.0 standard drinks    Types: 4 Shots of liquor, 4 Glasses of wine per week    Comment: none since gallbladder issues  . Drug use: No  . Sexual activity: Yes  Other Topics Concern  . Not on file  Social History Narrative   Not exercising.  Married.   Has 2 children, 2 grandchildren, one child [place], [place], one in [place], [place].   Prior worked as Pension scheme manager.  As of 09/2019   Social Determinants of Health   Financial Resource Strain:   . Difficulty of Paying Living Expenses: Not on file  Food Insecurity:   .  Worried About Charity fundraiser in the Last Year: Not on file  . Ran Out of Food in the Last Year: Not on file  Transportation Needs:   . Lack of Transportation (Medical): Not on file  . Lack of Transportation (Non-Medical): Not on file  Physical Activity:   . Days of Exercise per Week: Not on file  . Minutes of Exercise per Session: Not on file  Stress:   . Feeling of Stress : Not on file  Social Connections:   . Frequency of Communication with Friends and Family: Not on file  . Frequency of Social Gatherings with Friends and Family: Not on file  . Attends Religious Services: Not on file  . Active Member of Clubs or Organizations: Not on file  . Attends Archivist Meetings: Not on file  . Marital Status: Not on file     Family History:  The patient's family history includes Dementia in his  mother; Heart disease (age of onset: 39) in his father; Obesity in his father.   ROS:   Please see the history of present illness.    ROS All other systems reviewed and are negative.  No flowsheet data found.     PHYSICAL EXAM:   VS:  BP 134/66   Pulse (!) 57   Ht 5\' 8"  (1.727 m)   Wt 238 lb 6.4 oz (108.1 kg)   SpO2 96%   BMI 36.25 kg/m    GEN: Well nourished, well developed, in no acute distress  HEENT: normal  Neck: no JVD, carotid bruits, or masses Cardiac: RRR; no murmurs, rubs, or gallops,no edema.  Intact distal pulses bilaterally.  Respiratory:  clear to auscultation bilaterally, normal work of breathing GI: soft, nontender, nondistended, + BS MS: no deformity or atrophy  Skin: warm and dry, no rash Neuro:  Alert and Oriented x 3, Strength and sensation are intact Psych: euthymic mood, full affect  Wt Readings from Last 3 Encounters:  09/25/19 238 lb 6.4 oz (108.1 kg)  09/18/19 235 lb 6.4 oz (106.8 kg)  07/19/18 234 lb 9.6 oz (106.4 kg)      Studies/Labs Reviewed:   EKG:  EKG is ordered today.  The ekg ordered today demonstrates sinus bradycardia with no ST changes  Recent Labs: 09/18/2019: ALT 12; BUN 14; Creatinine, Ser 1.08; Hemoglobin 15.1; Platelets 248; Potassium 4.8; Sodium 141   Lipid Panel    Component Value Date/Time   CHOL 171 09/18/2019 1104   TRIG 84 09/18/2019 1104   HDL 51 09/18/2019 1104   CHOLHDL 3.4 09/18/2019 1104   CHOLHDL 4.1 10/18/2016 1027   VLDL 25 10/18/2016 1027   LDLCALC 104 (H) 09/18/2019 1104    Additional studies/ records that were reviewed today include:  Office notes from PCP, labs    ASSESSMENT:    1. Family history of premature CAD   2. Pure hypercholesterolemia   3. Obesity (BMI 30-39.9)      PLAN:  In order of problems listed above:  1. Family hx of premature CAD -his father had CAD dx in his 30's and died of an MI at age 30 -his CRFs include remote tobacco use, obesity, HLD and fm hx -he has no  anginal CP but has some DOE with extreme exertion -recommend coronary calcium score to assess future risk -will also get Lexiscan myoview to rule out silent ischemia  2.  HLD -LDL goal < 100 -LDL was 104 on 09/18/2019 -continue pravstatin 20mg  daily  -if he  is found to have coronary calcium then will need an LDL goal of < 70  3. Obesity -I have encouraged him to get into a routine exercise program and cut back on carbs and portions.    Medication Adjustments/Labs and Tests Ordered: Current medicines are reviewed at length with the patient today.  Concerns regarding medicines are outlined above.  Medication changes, Labs and Tests ordered today are listed in the Patient Instructions below.  There are no Patient Instructions on file for this visit.   Signed, Fransico Him, MD  09/25/2019 3:14 PM    [facility] [place], [place], [place]  [address] Phone: [phone]; Fax: [phone]

## 2019-09-25 ENCOUNTER — Ambulatory Visit: Payer: Medicare HMO | Admitting: Cardiology

## 2019-09-25 ENCOUNTER — Other Ambulatory Visit: Payer: Self-pay

## 2019-09-25 ENCOUNTER — Encounter: Payer: Self-pay | Admitting: Cardiology

## 2019-09-25 VITALS — BP 134/66 | HR 57 | Ht 68.0 in | Wt 238.4 lb

## 2019-09-25 DIAGNOSIS — Z8249 Family history of ischemic heart disease and other diseases of the circulatory system: Secondary | ICD-10-CM

## 2019-09-25 DIAGNOSIS — E669 Obesity, unspecified: Secondary | ICD-10-CM

## 2019-09-25 DIAGNOSIS — E7800 Pure hypercholesterolemia, unspecified: Secondary | ICD-10-CM

## 2019-09-25 NOTE — Patient Instructions (Signed)
Medication Instructions:  Your physician recommends that you continue on your current medications as directed. Please refer to the Current Medication list given to you today.  Labwork: None ordered.  Testing/Procedures: Your physician has requested that you have a lexiscan myoview. For further information please visit [email or web address]. Please follow instruction sheet, as given.  Your physician has requested that you have cardiac calcium scoring CT. Cardiac computed tomography (CT) is a painless test that uses an x-ray machine to take clear, detailed pictures of your heart. For further information please visit [email or web address]. Please follow instruction sheet as given.   Follow-Up: Your physician recommends that you schedule a follow-up appointment as needed with Dr. Radford Pax   Any Other Special Instructions Will Be Listed Below (If Applicable).     If you need a refill on your cardiac medications before your next appointment, please call your pharmacy.

## 2019-10-08 DIAGNOSIS — Z9289 Personal history of other medical treatment: Secondary | ICD-10-CM

## 2019-10-08 HISTORY — DX: Personal history of other medical treatment: Z92.89

## 2019-10-10 ENCOUNTER — Telehealth (HOSPITAL_COMMUNITY): Payer: Self-pay | Admitting: *Deleted

## 2019-10-10 NOTE — Telephone Encounter (Signed)
Patient given detailed instructions per Myocardial Perfusion Study Information Sheet for the test on 10/12/19 at 10:30. Patient notified to arrive 15 minutes early and that it is imperative to arrive on time for appointment to keep from having the test rescheduled.  If you need to cancel or reschedule your appointment, please call the office within 24 hours of your appointment. . Patient verbalized understanding.Guy Evans

## 2019-10-11 ENCOUNTER — Telehealth: Payer: Self-pay | Admitting: Medical

## 2019-10-11 DIAGNOSIS — L821 Other seborrheic keratosis: Secondary | ICD-10-CM | POA: Diagnosis not present

## 2019-10-11 DIAGNOSIS — Z85828 Personal history of other malignant neoplasm of skin: Secondary | ICD-10-CM | POA: Diagnosis not present

## 2019-10-11 DIAGNOSIS — L570 Actinic keratosis: Secondary | ICD-10-CM | POA: Diagnosis not present

## 2019-10-11 NOTE — Telephone Encounter (Signed)
Received requested info from [facility] [address].

## 2019-10-12 ENCOUNTER — Ambulatory Visit (HOSPITAL_COMMUNITY): Payer: Medicare HMO | Attending: Cardiovascular Disease

## 2019-10-12 ENCOUNTER — Other Ambulatory Visit: Payer: Self-pay

## 2019-10-12 ENCOUNTER — Ambulatory Visit (INDEPENDENT_AMBULATORY_CARE_PROVIDER_SITE_OTHER)
Admission: RE | Admit: 2019-10-12 | Discharge: 2019-10-12 | Disposition: A | Discharge status: Home / Self Care | Payer: Self-pay | Source: Ambulatory Visit | Attending: Cardiology | Admitting: Cardiology

## 2019-10-12 ENCOUNTER — Ambulatory Visit
Admission: RE | Admit: 2019-10-12 | Discharge: 2019-10-12 | Disposition: A | Discharge status: Home / Self Care | Payer: Self-pay | Source: Ambulatory Visit

## 2019-10-12 ENCOUNTER — Ambulatory Visit: Admission: RE | Admit: 2019-10-12 | Payer: Self-pay | Source: Ambulatory Visit

## 2019-10-12 DIAGNOSIS — R0609 Other forms of dyspnea: Secondary | ICD-10-CM | POA: Insufficient documentation

## 2019-10-12 DIAGNOSIS — Z87891 Personal history of nicotine dependence: Secondary | ICD-10-CM | POA: Insufficient documentation

## 2019-10-12 DIAGNOSIS — Z136 Encounter for screening for cardiovascular disorders: Secondary | ICD-10-CM | POA: Insufficient documentation

## 2019-10-12 DIAGNOSIS — Z8249 Family history of ischemic heart disease and other diseases of the circulatory system: Secondary | ICD-10-CM | POA: Insufficient documentation

## 2019-10-12 LAB — MYOCARDIAL PERFUSION IMAGING
LV dias vol: 95 mL (ref 62–150)
LV sys vol: 35 mL
Peak HR: 92 {beats}/min
Rest HR: 66 {beats}/min
SDS: 0
SRS: 0
SSS: 0
TID: 1.06

## 2019-10-12 MED ORDER — REGADENOSON 0.4 MG/5ML IV SOLN
0.4000 mg | Freq: Once | INTRAVENOUS | Status: AC
  Administered 2019-10-12: 0.4 mg via INTRAVENOUS

## 2019-10-12 MED ORDER — TECHNETIUM TC 99M TETROFOSMIN IV KIT
32.1000 | PACK | Freq: Once | INTRAVENOUS | Status: AC | PRN
  Administered 2019-10-12: 32.1 via INTRAVENOUS
  Filled 2019-10-12: qty 33

## 2019-10-12 MED ORDER — TECHNETIUM TC 99M TETROFOSMIN IV KIT
10.6000 | PACK | Freq: Once | INTRAVENOUS | Status: AC | PRN
  Administered 2019-10-12: 10.6 via INTRAVENOUS
  Filled 2019-10-12: qty 11

## 2019-10-15 ENCOUNTER — Telehealth: Payer: Self-pay

## 2019-10-15 DIAGNOSIS — E7800 Pure hypercholesterolemia, unspecified: Secondary | ICD-10-CM

## 2019-10-15 MED ORDER — PRAVASTATIN SODIUM 40 MG PO TABS: 40.0000 mg | ORAL_TABLET | Freq: Every evening | ORAL | 3 refills | Status: DC

## 2019-10-15 NOTE — Telephone Encounter (Signed)
-----   Message from Sueanne Margarita, MD sent at 10/14/2019 12:11 AM EST ----- Chest CT showed advanced emphysema, aortic plaque buildup as well as high calcium score in coronary arteries.  Please forward findings to PCP to followup o lung findings.  Needs aggressive treatment of hyperlipidemia.  LDL 104 and goal < 70.  Please increase Pravastatin to 40mg  daily and repeat FLP and ALT in 6 weeks.  Followup with me in 1 year

## 2019-10-15 NOTE — Telephone Encounter (Signed)
The patient has been notified of the result and verbalized understanding.  All questions (if any) were answered. Patient will increase Pravastatin to 40 mg daily and repeat labs 03/25. Antonieta Iba, RN 10/15/2019 12:06 PM

## 2019-10-22 ENCOUNTER — Other Ambulatory Visit: Payer: Medicare HMO

## 2019-10-22 ENCOUNTER — Other Ambulatory Visit: Payer: Self-pay

## 2019-10-22 DIAGNOSIS — J439 Emphysema, unspecified: Secondary | ICD-10-CM

## 2019-10-30 ENCOUNTER — Encounter: Payer: Self-pay | Admitting: Medical

## 2019-11-29 ENCOUNTER — Other Ambulatory Visit: Payer: Self-pay

## 2019-11-29 ENCOUNTER — Other Ambulatory Visit: Payer: Medicare HMO | Admitting: *Deleted

## 2019-11-29 DIAGNOSIS — E7800 Pure hypercholesterolemia, unspecified: Secondary | ICD-10-CM

## 2019-11-29 LAB — LIPID PANEL
Chol/HDL Ratio: 3 ratio (ref 0.0–5.0)
Cholesterol, Total: 155 mg/dL (ref 100–199)
HDL: 51 mg/dL (ref >39)
LDL Chol Calc (NIH): 92 mg/dL (ref 0–99)
Triglycerides: 60 mg/dL (ref 0–149)
VLDL Cholesterol Cal: 12 mg/dL (ref 5–40)

## 2019-11-29 LAB — ALT: ALT: 13 IU/L (ref 0–44)

## 2019-12-06 ENCOUNTER — Telehealth: Payer: Self-pay

## 2019-12-06 DIAGNOSIS — E7800 Pure hypercholesterolemia, unspecified: Secondary | ICD-10-CM

## 2019-12-06 MED ORDER — PRAVASTATIN SODIUM 80 MG PO TABS: 80.0000 mg | ORAL_TABLET | Freq: Every evening | ORAL | 3 refills | Status: DC

## 2019-12-06 NOTE — Telephone Encounter (Signed)
-----   Message from Sueanne Margarita, MD sent at 11/29/2019  6:33 PM EDT ----- LDL still not at goal - increase Pravastatin to 80mg  daily and repeat FLP and ALT in 6 weeks

## 2019-12-06 NOTE — Telephone Encounter (Signed)
The patient has been notified of the result and verbalized understanding.  All questions (if any) were answered. Antonieta Iba, RN 12/06/2019 9:05 AM  Patient will increase pravastatin to 80 mg daily and come in for repeat lab work in 6 weeks.

## 2020-01-15 DIAGNOSIS — R69 Illness, unspecified: Secondary | ICD-10-CM | POA: Diagnosis not present

## 2020-01-18 ENCOUNTER — Other Ambulatory Visit: Payer: Medicare HMO | Admitting: *Deleted

## 2020-01-18 ENCOUNTER — Other Ambulatory Visit: Payer: Self-pay

## 2020-01-18 DIAGNOSIS — E7800 Pure hypercholesterolemia, unspecified: Secondary | ICD-10-CM

## 2020-01-18 LAB — LIPID PANEL
Chol/HDL Ratio: 2.8 ratio (ref 0.0–5.0)
Cholesterol, Total: 138 mg/dL (ref 100–199)
HDL: 49 mg/dL (ref >39)
LDL Chol Calc (NIH): 77 mg/dL (ref 0–99)
Triglycerides: 55 mg/dL (ref 0–149)
VLDL Cholesterol Cal: 12 mg/dL (ref 5–40)

## 2020-01-18 LAB — ALT: ALT: 9 IU/L (ref 0–44)

## 2020-01-21 ENCOUNTER — Telehealth: Payer: Self-pay

## 2020-01-21 DIAGNOSIS — E7800 Pure hypercholesterolemia, unspecified: Secondary | ICD-10-CM

## 2020-01-21 MED ORDER — ATORVASTATIN CALCIUM 80 MG PO TABS
80.0000 mg | ORAL_TABLET | Freq: Every day | ORAL | 3 refills | Status: DC
Start: 2020-01-21 — End: 2021-01-08

## 2020-01-21 NOTE — Telephone Encounter (Signed)
-----   Message from Sueanne Margarita, MD sent at 01/21/2020  8:15 AM EDT ----- Stop Pravastatin and change to Lipitor 80mg  daily and repeat FLP and ALT in 6 months

## 2020-01-21 NOTE — Telephone Encounter (Signed)
The patient has been notified of the result and verbalized understanding.  All questions (if any) were answered. Antonieta Iba, RN 01/21/2020 3:52 PM

## 2020-02-06 DIAGNOSIS — D2362 Other benign neoplasm of skin of left upper limb, including shoulder: Secondary | ICD-10-CM | POA: Diagnosis not present

## 2020-02-06 DIAGNOSIS — L814 Other melanin hyperpigmentation: Secondary | ICD-10-CM | POA: Diagnosis not present

## 2020-02-06 DIAGNOSIS — D225 Melanocytic nevi of trunk: Secondary | ICD-10-CM | POA: Diagnosis not present

## 2020-02-06 DIAGNOSIS — C44519 Basal cell carcinoma of skin of other part of trunk: Secondary | ICD-10-CM | POA: Diagnosis not present

## 2020-02-06 DIAGNOSIS — L821 Other seborrheic keratosis: Secondary | ICD-10-CM | POA: Diagnosis not present

## 2020-02-06 DIAGNOSIS — D224 Melanocytic nevi of scalp and neck: Secondary | ICD-10-CM | POA: Diagnosis not present

## 2020-02-06 DIAGNOSIS — Z85828 Personal history of other malignant neoplasm of skin: Secondary | ICD-10-CM | POA: Diagnosis not present

## 2020-02-06 DIAGNOSIS — C44712 Basal cell carcinoma of skin of right lower limb, including hip: Secondary | ICD-10-CM | POA: Diagnosis not present

## 2020-02-06 DIAGNOSIS — D485 Neoplasm of uncertain behavior of skin: Secondary | ICD-10-CM | POA: Diagnosis not present

## 2020-02-20 ENCOUNTER — Telehealth: Payer: Self-pay

## 2020-02-20 NOTE — Telephone Encounter (Signed)
Rx ready to fax

## 2020-02-20 NOTE — Telephone Encounter (Signed)
Pt. Called stating that he would like to get scheduled for a shingrix vaccine. I told him since he has [employer] he would have to get that shot at the pharmacy. I did let him know that we could send a prescription over to his pharmacy for the shingrix inj.

## 2020-03-03 ENCOUNTER — Other Ambulatory Visit: Payer: Medicare HMO | Admitting: *Deleted

## 2020-03-03 ENCOUNTER — Other Ambulatory Visit: Payer: Self-pay

## 2020-03-03 DIAGNOSIS — E7800 Pure hypercholesterolemia, unspecified: Secondary | ICD-10-CM

## 2020-03-03 LAB — LIPID PANEL
Chol/HDL Ratio: 2.5 ratio (ref 0.0–5.0)
Cholesterol, Total: 112 mg/dL (ref 100–199)
HDL: 45 mg/dL (ref >39)
LDL Chol Calc (NIH): 50 mg/dL (ref 0–99)
Triglycerides: 83 mg/dL (ref 0–149)
VLDL Cholesterol Cal: 17 mg/dL (ref 5–40)

## 2020-03-03 LAB — ALT: ALT: 15 IU/L (ref 0–44)

## 2020-04-29 DIAGNOSIS — B078 Other viral warts: Secondary | ICD-10-CM | POA: Diagnosis not present

## 2020-04-29 DIAGNOSIS — D485 Neoplasm of uncertain behavior of skin: Secondary | ICD-10-CM | POA: Diagnosis not present

## 2020-04-29 DIAGNOSIS — C44519 Basal cell carcinoma of skin of other part of trunk: Secondary | ICD-10-CM | POA: Diagnosis not present

## 2020-04-29 DIAGNOSIS — Z85828 Personal history of other malignant neoplasm of skin: Secondary | ICD-10-CM | POA: Diagnosis not present

## 2020-04-29 DIAGNOSIS — C44712 Basal cell carcinoma of skin of right lower limb, including hip: Secondary | ICD-10-CM | POA: Diagnosis not present

## 2020-04-29 DIAGNOSIS — L821 Other seborrheic keratosis: Secondary | ICD-10-CM | POA: Diagnosis not present

## 2020-04-29 DIAGNOSIS — C44719 Basal cell carcinoma of skin of left lower limb, including hip: Secondary | ICD-10-CM | POA: Diagnosis not present

## 2020-05-06 ENCOUNTER — Telehealth: Payer: Self-pay | Admitting: Medical

## 2020-05-06 NOTE — Telephone Encounter (Signed)
I received pharmacy note that he is taking both atorvastatin Lipitor and pravastatin. He should only be on atorvastatin. Please have him discontinue Pravachol and make sure pharmacy is aware to discontinue Pravachol as well

## 2020-05-07 NOTE — Telephone Encounter (Signed)
Patient stated he is only taking atorvastatin.

## 2020-06-16 DIAGNOSIS — R69 Illness, unspecified: Secondary | ICD-10-CM | POA: Diagnosis not present

## 2020-09-18 ENCOUNTER — Ambulatory Visit: Payer: Medicare HMO | Admitting: Medical

## 2020-09-24 ENCOUNTER — Ambulatory Visit: Payer: Medicare HMO | Admitting: Medical

## 2020-09-30 ENCOUNTER — Other Ambulatory Visit: Payer: Self-pay | Admitting: Medical

## 2020-10-05 ENCOUNTER — Other Ambulatory Visit: Payer: Self-pay | Admitting: Medical

## 2020-10-20 ENCOUNTER — Other Ambulatory Visit: Payer: Self-pay

## 2020-10-20 ENCOUNTER — Encounter: Payer: Self-pay | Admitting: Medical

## 2020-10-20 ENCOUNTER — Ambulatory Visit (INDEPENDENT_AMBULATORY_CARE_PROVIDER_SITE_OTHER): Payer: Medicare HMO | Admitting: Medical

## 2020-10-20 VITALS — BP 130/70 | HR 67 | Ht 68.0 in | Wt 244.4 lb

## 2020-10-20 DIAGNOSIS — Z8249 Family history of ischemic heart disease and other diseases of the circulatory system: Secondary | ICD-10-CM | POA: Diagnosis not present

## 2020-10-20 DIAGNOSIS — K219 Gastro-esophageal reflux disease without esophagitis: Secondary | ICD-10-CM

## 2020-10-20 DIAGNOSIS — R29890 Loss of height: Secondary | ICD-10-CM | POA: Diagnosis not present

## 2020-10-20 DIAGNOSIS — Z7189 Other specified counseling: Secondary | ICD-10-CM

## 2020-10-20 DIAGNOSIS — N400 Enlarged prostate without lower urinary tract symptoms: Secondary | ICD-10-CM

## 2020-10-20 DIAGNOSIS — K635 Polyp of colon: Secondary | ICD-10-CM

## 2020-10-20 DIAGNOSIS — Z0000 Encounter for general adult medical examination without abnormal findings: Secondary | ICD-10-CM | POA: Diagnosis not present

## 2020-10-20 DIAGNOSIS — E7800 Pure hypercholesterolemia, unspecified: Secondary | ICD-10-CM | POA: Diagnosis not present

## 2020-10-20 DIAGNOSIS — Z1211 Encounter for screening for malignant neoplasm of colon: Secondary | ICD-10-CM | POA: Diagnosis not present

## 2020-10-20 DIAGNOSIS — Z6837 Body mass index (BMI) 37.0-37.9, adult: Secondary | ICD-10-CM | POA: Diagnosis not present

## 2020-10-20 DIAGNOSIS — Z13820 Encounter for screening for osteoporosis: Secondary | ICD-10-CM | POA: Insufficient documentation

## 2020-10-20 DIAGNOSIS — Z7185 Encounter for immunization safety counseling: Secondary | ICD-10-CM | POA: Diagnosis not present

## 2020-10-20 DIAGNOSIS — R9389 Abnormal findings on diagnostic imaging of other specified body structures: Secondary | ICD-10-CM | POA: Insufficient documentation

## 2020-10-20 DIAGNOSIS — Z131 Encounter for screening for diabetes mellitus: Secondary | ICD-10-CM

## 2020-10-20 DIAGNOSIS — K649 Unspecified hemorrhoids: Secondary | ICD-10-CM

## 2020-10-20 DIAGNOSIS — K2270 Barrett's esophagus without dysplasia: Secondary | ICD-10-CM | POA: Diagnosis not present

## 2020-10-20 DIAGNOSIS — Z85828 Personal history of other malignant neoplasm of skin: Secondary | ICD-10-CM

## 2020-10-20 DIAGNOSIS — I2510 Atherosclerotic heart disease of native coronary artery without angina pectoris: Secondary | ICD-10-CM

## 2020-10-20 DIAGNOSIS — R918 Other nonspecific abnormal finding of lung field: Secondary | ICD-10-CM | POA: Insufficient documentation

## 2020-10-20 DIAGNOSIS — M722 Plantar fascial fibromatosis: Secondary | ICD-10-CM

## 2020-10-20 DIAGNOSIS — I700 Atherosclerosis of aorta: Secondary | ICD-10-CM

## 2020-10-20 DIAGNOSIS — Z136 Encounter for screening for cardiovascular disorders: Secondary | ICD-10-CM

## 2020-10-20 NOTE — Patient Instructions (Signed)
  Today you had a preventative care visit or wellness visit.    Topics today may have included healthy lifestyle, diet, exercise, preventative care, vaccinations, sick and well care, proper use of emergency dept and after hours care, as well as other concerns.     Recommendations: Continue to return yearly for your annual wellness and preventative care visits.  This gives [place] a chance to discuss healthy lifestyle, exercise, vaccinations, review your chart record, and perform screenings where appropriate.  I recommend you see your eye doctor yearly for routine vision care.  I recommend you see your dentist yearly for routine dental care including hygiene visits twice yearly.   Vaccination recommendations were reviewed You are up to date on COVID vaccine You are up-to-date on Shingrix shingles vaccine You are up-to-date on yearly flu shot  I recommend you call your insurance to inquire about coverage for tetanus vaccine as you are due for this  I recommend the pneumococcal pneumonia vaccines, pneumococcal 23 and Prevnar 13.  You are due for these now   Screening for cancer: Colon cancer screening:  I reviewed your last Cologuard test which was in 2019 and was normal.  Your last colonoscopy was 2014 [place] preventative services task force recommends colonoscopies up until age 51.  You are due at this time either for repeat Cologuard or consider colonoscopy repeat  We discussed PSA, prostate exam, and prostate cancer screening risks/benefits.   [place] preventative services task force recommends against prostate cancer screening after age 65  Skin cancer screening: Check your skin regularly for new changes, growing lesions, or other lesions of concern Come in for evaluation if you have skin lesions of concern.  Lung cancer screening: If you have a greater than 30 pack year history of tobacco use, then you qualify for lung cancer screening with a chest CT scan  We  currently don't have screenings for other cancers besides breast, cervical, colon, and lung cancers.  If you have a strong family history of cancer or have other cancer screening concerns, please let me know.    Bone health: Get at least 150 minutes of aerobic exercise weekly Get weight bearing exercise at least once weekly Consider a bone density test if you have family history of osteoporosis, family history of adult with fracture such as parents or siblings, or if you have lost height over time   Heart health: Get at least 150 minutes of aerobic exercise weekly Limit alcohol It is important to maintain a healthy blood pressure and healthy cholesterol numbers    Separate significant issues discussed:  BPH-continue Terazosin, PSA lab today.  Discussed risk and benefits of screening.  Hyperlipidemia-continue statin.  Labs today.  Consider other heart disease screening.  Barrett's esophagus -continue PPI, consider updating endoscopies  Obesity-needs to work on lifestyle changes and weight loss  Loss of height - I recommend a baseline bone density test to screen for osteoporosis or low bone mass.    Please call to schedule your bone density test   [facility]  [phone], [place], [place] [address]  I recommend a vitamin D supplement, 1000 units daily for bone and skin health.   Skin surveillance - see your dermatologist regularly  Advanced directives - discussed nature and purpose of Advanced Directives, encouraged them to complete them if they have not done so and/or encouraged them to get [place] a copy if they have done this already.

## 2020-10-20 NOTE — Progress Notes (Addendum)
Subjective:    Guy Evans is a 70 y.o. male who presents for Preventative Services visit and chronic medical problems/med check visit.    Primary Care Provider Illiana Losurdo, Camelia Eng, PA-C here for primary care  Current Health Care Team:  Dentist, [place] doctor  [place]  [facility], Dr. Fontaine No  Dr. Coralie Keens, orthopedics  Dr. Arta Silence, [place] you may have received from other than [facility] providers in the past year (date may be approximate) dermatology  Exercise Current exercise habits: The patient does not participate in regular exercise at present.   Nutrition/Diet Current diet: in general, an "unhealthy" diet  Depression Screen Depression screen [facility] 2/9 10/20/2020  Decreased Interest 0  Down, Depressed, Hopeless 0  PHQ - 2 Score 0  Altered sleeping -  Tired, decreased energy -  Change in appetite -  Feeling bad or failure about yourself  -  Trouble concentrating -  Moving slowly or fidgety/restless -  PHQ-9 Score -    Activities of Daily Living Screen/Functional Status Survey Is the patient deaf or have difficulty hearing?: No Does the patient have difficulty seeing, even when wearing glasses/contacts?: No Does the patient have difficulty concentrating, remembering, or making decisions?: No Does the patient have difficulty walking or climbing stairs?: No Does the patient have difficulty dressing or bathing?: No Does the patient have difficulty doing errands alone such as visiting a doctor's office or shopping?: No  Can patient draw a clock face showing 3:15 oclock,yes  Fall Risk Screen Fall Risk  10/20/2020 09/18/2019 04/03/2018 10/18/2016 10/18/2016  Falls in the past year? 0 0 No No No  Risk for fall due to : No Fall Risks - - - -  Follow up Falls evaluation completed - - - -    Gait Assessment: Normal gait observed YES  Advanced directives Does patient have a [place]? Yes Does patient have a Living Will? Yes  Past Medical History:  Diagnosis Date  . Enlarged prostate    takes Terazosin daily  . Gastroparesis   . GERD (gastroesophageal reflux disease)    occasionally but no meds required  . H/O cardiovascular stress test 10/2019   nuclear, normal, Dr. Fransico Him  . History of colon polyps   . History of gastric ulcer   . Joint pain    knees and ankles  . Urinary frequency     Past Surgical History:  Procedure Laterality Date  . CHOLECYSTECTOMY N/A 11/19/2013   Procedure: LAPAROSCOPIC CHOLECYSTECTOMY;  Surgeon: Harl Bowie, MD;  Location: [place];  Service: General;  Laterality: N/A;  . COLONOSCOPY    . ESOPHAGOGASTRODUODENOSCOPY    . skin spots removed and tested     done in the office    Social History   Socioeconomic History  . Marital status: Married    Spouse name: Not on file  . Number of children: Not on file  . Years of education: Not on file  . Highest education level: Not on file  Occupational History  . Not on file  Tobacco Use  . Smoking status: Former Research scientist (life sciences)  . Smokeless tobacco: Never Used  . Tobacco comment: quit smoking around 1993  Substance and Sexual Activity  . Alcohol use: Yes    Alcohol/week: 12.0 standard drinks    Types: 6 Glasses of wine, 6 Shots of liquor per week    Comment: none since gallbladder issues  . Drug use: Yes  Types: Marijuana  . Sexual activity: Yes  Other Topics Concern  . Not on file  Social History Narrative   Not exercising.  Married.   Has 2 children, 2 grandchildren, one child [place], [place], one in [place], [place].   Prior worked as Pension scheme manager.  As of 10/2020   Social Determinants of Health   Financial Resource Strain: Not on file  Food Insecurity: Not on file  Transportation Needs: Not on file  Physical Activity: Not on file  Stress: Not on file  Social Connections: Not on file  Intimate Partner Violence: Not on file    Family History  Problem  Relation Age of Onset  . Dementia Mother   . Heart disease Father 38       MI at 44yo, angina  . Obesity Father   . Cancer Neg Hx   . Stroke Neg Hx   . Diabetes Neg Hx      Current Outpatient Medications:  .  atorvastatin (LIPITOR) 80 MG tablet, Take 1 tablet (80 mg total) by mouth daily., Disp: 90 tablet, Rfl: 3 .  glucosamine-chondroitin 500-400 MG tablet, Take 2 tablets by mouth 1 day or 1 dose., Disp: , Rfl:  .  omeprazole (PRILOSEC) 20 MG capsule, Take by mouth., Disp: , Rfl:  .  terazosin (HYTRIN) 10 MG capsule, TAKE 1 CAPSULE BY MOUTH EVERY DAY, Disp: 90 capsule, Rfl: 0 .  Turmeric (QC TUMERIC COMPLEX PO), Take 1,000 mg by mouth daily., Disp: , Rfl:   No Known Allergies  History reviewed: allergies, current medications, past family history, past medical history, past social history, past surgical history and problem list  Chronic issues discussed: Compliant with GERD medication, cholesterol medication and doing well in general    Acute issues discussed: none  Objective:      Biometrics BP 130/70   Pulse 67   Ht 5\' 8"  (1.727 m)   Wt 244 lb 6.4 oz (110.9 kg)   SpO2 93%   BMI 37.16 kg/m   BP Readings from Last 3 Encounters:  10/20/20 130/70  09/25/19 134/66  09/18/19 120/70   Wt Readings from Last 3 Encounters:  10/20/20 244 lb 6.4 oz (110.9 kg)  10/12/19 238 lb (108 kg)  09/25/19 238 lb 6.4 oz (108.1 kg)     Cognitive Testing  Alert? Yes  Normal Appearance?Yes  Oriented to person? Yes  Place? Yes   Time? Yes  Recall of three objects?  Yes  Can perform simple calculations? Yes  Displays appropriate judgment?Yes  Can read the correct time from a watch face?Yes  General appearance: alert, no distress, WD/WN, white male  Nutritional Status: Inadequate calore intake? no Loss of muscle mass? no Loss of fat beneath skin? no Localized or general edema? no Diminished functional status? no  Other pertinent exam: Skin: Scattered macules, no specific  worrisome lesions HEENT: normocephalic, sclerae anicteric, TMs pearly, nares patent, no discharge or erythema, pharynx normal Oral cavity: MMM, no lesions Neck: supple, no lymphadenopathy, no thyromegaly, no masses, no bruits Heart: RRR, normal S1, S2, no murmurs Lungs: CTA bilaterally, no wheezes, rhonchi, or rales Abdomen: +bs, soft, non tender, non distended, no masses, no hepatomegaly, no splenomegaly Musculoskeletal: nontender, no swelling, no obvious deformity Extremities: no edema, no cyanosis, no clubbing Pulses: 2+ symmetric, upper and lower extremities, normal cap refill Neurological: alert, oriented x 3, CN2-12 intact, strength normal upper extremities and lower extremities, sensation normal throughout, DTRs 2+ throughout, no cerebellar signs, gait normal Psychiatric: normal affect, behavior  normal, pleasant    Assessment:   Encounter Diagnoses  Name Primary?  . Encounter for health maintenance examination in adult Yes  . Medicare annual wellness visit, subsequent   . Benign prostatic hyperplasia, unspecified whether lower urinary tract symptoms present   . Vaccine counseling   . Screen for colon cancer   . Polyp of colon, unspecified part of colon, unspecified type   . BMI 37.0-37.9, adult   . Pure hypercholesterolemia   . Hemorrhoids, unspecified hemorrhoid type   . Family history of premature CAD   . Barrett's esophagus without dysplasia   . Advanced directives, counseling/discussion   . Loss of height   . Screening for osteoporosis   . Aortic atherosclerosis ([place])   . Abnormal CT of the chest   . Coronary artery disease involving native heart without angina pectoris, unspecified vessel or lesion type   . Screening for diabetes mellitus   . History of skin cancer   . Gastroesophageal reflux disease, unspecified whether esophagitis present   . Plantar fasciitis   . Encounter for screening for vascular disease      Plan:   A personalized plan was printed today  for your records and use.   Personalized health advice and education was given today to reduce health risks and promote self management and wellness.  Information regarding end of life planning was discussed today.    Today you had a preventative care visit or wellness visit.    Topics today may have included healthy lifestyle, diet, exercise, preventative care, vaccinations, sick and well care, proper use of emergency dept and after hours care, as well as other concerns.     Recommendations: Continue to return yearly for your annual wellness and preventative care visits.  This gives [place] a chance to discuss healthy lifestyle, exercise, vaccinations, review your chart record, and perform screenings where appropriate.  I recommend you see your eye doctor yearly for routine vision care.  I recommend you see your dentist yearly for routine dental care including hygiene visits twice yearly.   Vaccination recommendations were reviewed You are up to date on COVID vaccine You are up-to-date on Shingrix shingles vaccine You are up-to-date on yearly flu shot  I recommend you call your insurance to inquire about coverage for tetanus vaccine as you are due for this  I recommend the pneumococcal pneumonia vaccines, pneumococcal 23 and Prevnar 13.  You are due for these now   Screening for cancer: Colon cancer screening:  I reviewed your last Cologuard test which was in 2019 and was normal.  Your last colonoscopy was 2014 [place] preventative services task force recommends colonoscopies up until age 60.  You are due at this time either for repeat Cologuard or consider colonoscopy repeat  We discussed PSA, prostate exam, and prostate cancer screening risks/benefits.   [place] preventative services task force recommends against prostate cancer screening after age 78  Skin cancer screening: Check your skin regularly for new changes, growing lesions, or other lesions of  concern Come in for evaluation if you have skin lesions of concern.  Lung cancer screening: If you have a greater than 30 pack year history of tobacco use, then you qualify for lung cancer screening with a chest CT scan  We currently don't have screenings for other cancers besides breast, cervical, colon, and lung cancers.  If you have a strong family history of cancer or have other cancer screening concerns, please let me know.  Bone health: Get at least 150 minutes of aerobic exercise weekly Get weight bearing exercise at least once weekly Consider a bone density test if you have family history of osteoporosis, family history of adult with fracture such as parents or siblings, or if you have lost height over time   Heart health: Get at least 150 minutes of aerobic exercise weekly Limit alcohol It is important to maintain a healthy blood pressure and healthy cholesterol numbers    Separate significant issues discussed:  BPH-continue Terazosin, PSA lab today.  Discussed risk and benefits of screening.  Hyperlipidemia-continue statin.  Labs today.  Consider other heart disease screening.  Barrett's esophagus -continue PPI, consider updating endoscopies  Obesity-needs to work on lifestyle changes and weight loss  Loss of height - I recommend a baseline bone density test to screen for osteoporosis or low bone mass.    Please call to schedule your bone density test   [facility]  [phone] [place], [place] [address]  I recommend a vitamin D supplement, 1000 units daily for bone and skin health.   History of skin cancer, Skin surveillance - see your dermatologist regularly  I reviewed his cardiology consult note and nuclear stress test from February 2021 it was normal  I reviewed his calcium coronary score from February 2021 showing calcium score 640, 79th percentile for age-continue statin, work on improving on  weight and healthy lifestyle  Advanced directives - discussed nature and purpose of Advanced Directives, encouraged them to complete them if they have not done so and/or encouraged them to get [place] a copy if they have done this already.  Screen for diabetes - he will return for HgbA1C screen  Screen for vascular disease - recommended ABI screen  He may be moving out of state closer to his other son.  I wished him luck on the move and appreciated his trust in our care  Ross was seen today for medicare wellness.  Diagnoses and all orders for this visit:  Encounter for health maintenance examination in adult -     Comprehensive metabolic panel -     CBC -     Lipid panel -     PSA -     DG Bone Density; Future  Medicare annual wellness visit, subsequent  Benign prostatic hyperplasia, unspecified whether lower urinary tract symptoms present -     PSA  Vaccine counseling  Screen for colon cancer  Polyp of colon, unspecified part of colon, unspecified type  BMI 37.0-37.9, adult  Pure hypercholesterolemia -     Comprehensive metabolic panel -     Lipid panel  Hemorrhoids, unspecified hemorrhoid type  Family history of premature CAD  Barrett's esophagus without dysplasia  Advanced directives, counseling/discussion  Loss of height -     DG Bone Density; Future  Screening for osteoporosis -     DG Bone Density; Future  Aortic atherosclerosis (HCC)  Abnormal CT of the chest  Coronary artery disease involving native heart without angina pectoris, unspecified vessel or lesion type  Screening for diabetes mellitus  History of skin cancer  Gastroesophageal reflux disease, unspecified whether esophagitis present  Plantar fasciitis  Encounter for screening for vascular disease     Follow-up pending labs, yearly for physical    Medicare Attestation A preventative services visit was completed today.  During the course of the visit the patient was educated and  counseled about appropriate screening and preventive services.  A health risk assessment was established with the patient that included a review of current medications, allergies, social history, family history, medical and preventative health history, biometrics, and preventative screenings to identify potential safety concerns or impairments.  A personalized plan was printed today for the patient's records and use.   Personalized health advice and education was given today to reduce health risks and promote self management and wellness.  Information regarding end of life planning was discussed today.  Dorothea Ogle, PA-C   11/05/2020

## 2020-10-21 ENCOUNTER — Other Ambulatory Visit: Payer: Self-pay | Admitting: Medical

## 2020-10-21 LAB — COMPREHENSIVE METABOLIC PANEL
ALT: 18 IU/L (ref 0–44)
AST: 16 IU/L (ref 0–40)
Albumin/Globulin Ratio: 2 (ref 1.2–2.2)
Albumin: 4.4 g/dL (ref 3.8–4.8)
Alkaline Phosphatase: 105 IU/L (ref 44–121)
BUN/Creatinine Ratio: 12 (ref 10–24)
BUN: 13 mg/dL (ref 8–27)
Bilirubin Total: 0.7 mg/dL (ref 0.0–1.2)
CO2: 24 mmol/L (ref 20–29)
Calcium: 9.2 mg/dL (ref 8.6–10.2)
Chloride: 102 mmol/L (ref 96–106)
Creatinine, Ser: 1.06 mg/dL (ref 0.76–1.27)
GFR calc Af Amer: 82 mL/min/{1.73_m2} (ref >59)
GFR calc non Af Amer: 71 mL/min/{1.73_m2} (ref >59)
Globulin, Total: 2.2 g/dL (ref 1.5–4.5)
Glucose: 101 mg/dL — ABNORMAL HIGH (ref 65–99)
Potassium: 4.7 mmol/L (ref 3.5–5.2)
Sodium: 141 mmol/L (ref 134–144)
Total Protein: 6.6 g/dL (ref 6.0–8.5)

## 2020-10-21 LAB — LIPID PANEL
Chol/HDL Ratio: 2.5 ratio (ref 0.0–5.0)
Cholesterol, Total: 115 mg/dL (ref 100–199)
HDL: 46 mg/dL (ref >39)
LDL Chol Calc (NIH): 55 mg/dL (ref 0–99)
Triglycerides: 67 mg/dL (ref 0–149)
VLDL Cholesterol Cal: 14 mg/dL (ref 5–40)

## 2020-10-21 LAB — CBC
Hematocrit: 41.8 % (ref 37.5–51.0)
Hemoglobin: 14.2 g/dL (ref 13.0–17.7)
MCH: 30.9 pg (ref 26.6–33.0)
MCHC: 34 g/dL (ref 31.5–35.7)
MCV: 91 fL (ref 79–97)
Platelets: 235 10*3/uL (ref 150–450)
RBC: 4.59 x10E6/uL (ref 4.14–5.80)
RDW: 12.4 % (ref 11.6–15.4)
WBC: 6.9 10*3/uL (ref 3.4–10.8)

## 2020-10-21 LAB — PSA: Prostate Specific Ag, Serum: 3.7 ng/mL (ref 0.0–4.0)

## 2020-10-22 ENCOUNTER — Other Ambulatory Visit: Payer: Self-pay | Admitting: Medical

## 2020-10-22 DIAGNOSIS — Z0000 Encounter for general adult medical examination without abnormal findings: Secondary | ICD-10-CM

## 2020-10-22 DIAGNOSIS — Z13820 Encounter for screening for osteoporosis: Secondary | ICD-10-CM

## 2020-10-22 DIAGNOSIS — R29890 Loss of height: Secondary | ICD-10-CM

## 2020-11-05 ENCOUNTER — Telehealth: Payer: Self-pay | Admitting: Medical

## 2020-11-05 DIAGNOSIS — Z136 Encounter for screening for cardiovascular disorders: Secondary | ICD-10-CM | POA: Insufficient documentation

## 2020-11-05 DIAGNOSIS — Z131 Encounter for screening for diabetes mellitus: Secondary | ICD-10-CM | POA: Insufficient documentation

## 2020-11-05 DIAGNOSIS — K219 Gastro-esophageal reflux disease without esophagitis: Secondary | ICD-10-CM | POA: Insufficient documentation

## 2020-11-05 DIAGNOSIS — M722 Plantar fascial fibromatosis: Secondary | ICD-10-CM | POA: Insufficient documentation

## 2020-11-05 DIAGNOSIS — Z85828 Personal history of other malignant neoplasm of skin: Secondary | ICD-10-CM | POA: Insufficient documentation

## 2020-11-05 NOTE — Telephone Encounter (Signed)
[employer] form and records sent

## 2020-11-05 NOTE — Addendum Note (Signed)
Addended by: Carlena Hurl on: 11/05/2020 07:41 AM   Modules accepted: Orders

## 2020-11-05 NOTE — Telephone Encounter (Signed)
Insurance sent reminder on some screenings.  After reviewing chart, I recommended the following  Return at his convenience for HgbA1C diabetes screen finger stick test  See if agreeable to set up for ABIs for vascular disease screening in legs.    Continue follow up with dermatology for history of skin cancer  Juliann Pulse - I completed the [employer] form, and went back and made adjustments as above

## 2020-12-11 ENCOUNTER — Ambulatory Visit (INDEPENDENT_AMBULATORY_CARE_PROVIDER_SITE_OTHER): Payer: Medicare HMO

## 2020-12-11 DIAGNOSIS — Z23 Encounter for immunization: Secondary | ICD-10-CM | POA: Diagnosis not present

## 2020-12-26 ENCOUNTER — Other Ambulatory Visit: Payer: Self-pay | Admitting: Medical

## 2021-01-06 ENCOUNTER — Encounter: Payer: Self-pay | Admitting: Medical

## 2021-01-06 ENCOUNTER — Other Ambulatory Visit: Payer: Self-pay

## 2021-01-06 ENCOUNTER — Telehealth (INDEPENDENT_AMBULATORY_CARE_PROVIDER_SITE_OTHER): Payer: Medicare HMO | Admitting: Medical

## 2021-01-06 VITALS — BP 130/66 | HR 82 | Temp 98.1°F | Ht 68.0 in | Wt 237.0 lb

## 2021-01-06 DIAGNOSIS — J441 Chronic obstructive pulmonary disease with (acute) exacerbation: Secondary | ICD-10-CM | POA: Diagnosis not present

## 2021-01-06 DIAGNOSIS — J439 Emphysema, unspecified: Secondary | ICD-10-CM | POA: Insufficient documentation

## 2021-01-06 DIAGNOSIS — R0602 Shortness of breath: Secondary | ICD-10-CM | POA: Diagnosis not present

## 2021-01-06 DIAGNOSIS — I700 Atherosclerosis of aorta: Secondary | ICD-10-CM | POA: Diagnosis not present

## 2021-01-06 DIAGNOSIS — J449 Chronic obstructive pulmonary disease, unspecified: Secondary | ICD-10-CM | POA: Insufficient documentation

## 2021-01-06 MED ORDER — ALBUTEROL SULFATE HFA 108 (90 BASE) MCG/ACT IN AERS: 2.0000 | INHALATION_SPRAY | Freq: Four times a day (QID) | RESPIRATORY_TRACT | 0 refills | Status: DC | PRN

## 2021-01-06 MED ORDER — TRELEGY ELLIPTA 100-62.5-25 MCG/INH IN AEPB: 1.0000 | INHALATION_SPRAY | Freq: Every day | RESPIRATORY_TRACT | 0 refills | Status: DC

## 2021-01-06 NOTE — Progress Notes (Addendum)
Subjective:     Patient ID: Guy Evans, male   DOB: January 02, 1951, 70 y.o.   MRN: [id number]  HPI Chief Complaint  Patient presents with  . Nasal Congestion    C/O congestion x3 weeks with some sob    Consult for 3 week hx/o sob, congestion.  Has some general fatigue.  When walking from basement to upstairs or to his car, huffing and puffing.  No chest pain.  Feels like a wheezing sound in chest.  At night has whistling in sinuses.   Has some nasal congestion, but in past hasn't had a big problem with pollen allergies.  Initially thought this was allergies, but symptoms has not improved.  Yesterday evening took mucinex, that helped some.  No leg swelling.  Not checking weights.   237lb a few weeks ago.   Doesn't think he has gained any weight. Been using some Claritin as well.    Denies body aches, fever, nausea, no loss of smell or taste.    Former smoker, in the early 1990s quit.  Smokes cigars until mid 1990s.  He still smokes marijuana pretty regularly   Past Medical History:  Diagnosis Date  . Enlarged prostate    takes Terazosin daily  . Gastroparesis   . GERD (gastroesophageal reflux disease)    occasionally but no meds required  . H/O cardiovascular stress test 10/2019   nuclear, normal, Dr. Fransico Him  . History of colon polyps   . History of gastric ulcer   . Joint pain    knees and ankles  . Urinary frequency      Current Outpatient Medications on File Prior to Visit  Medication Sig Dispense Refill  . atorvastatin (LIPITOR) 80 MG tablet Take 1 tablet (80 mg total) by mouth daily. 90 tablet 3  . glucosamine-chondroitin 500-400 MG tablet Take 2 tablets by mouth 1 day or 1 dose.    Marland Kitchen omeprazole (PRILOSEC) 20 MG capsule Take by mouth.    . terazosin (HYTRIN) 10 MG capsule TAKE 1 CAPSULE BY MOUTH EVERY DAY 90 capsule 0  . Turmeric (QC TUMERIC COMPLEX PO) Take 1,000 mg by mouth daily.     No current facility-administered medications on file prior to visit.   Review  of Systems As in subjective    Objective:   Physical Exam  BP 130/66   Pulse 82   Temp 98.1 F (36.7 C)   Ht 5\' 8"  (1.727 m)   Wt 237 lb (107.5 kg)   SpO2 90%   BMI 36.04 kg/m   Wt Readings from Last 3 Encounters:  01/06/21 237 lb (107.5 kg)  10/20/20 244 lb 6.4 oz (110.9 kg)  10/12/19 238 lb (108 kg)     General appearence: alert, no distress, WD/WN, white male HEENT: normocephalic, sclerae anicteric, TMs pearly, nares patent, no discharge or erythema, pharynx normal Oral cavity: MMM, no lesions Neck: supple, no lymphadenopathy, no thyromegaly, no masses, no JVD Heart: RRR, normal S1, S2, no murmurs Lungs: CTA bilaterally, no wheezes, rhonchi, or rales Abdomen: +bs, soft, non tender, non distended, no masses, no hepatomegaly, no splenomegaly Pulses: 2+ symmetric, upper and lower extremities, normal cap refill Ext: no edema  EKG indication, shortness of breath, rate 76 bpm, PR 126 ms, QRS 86 ms, QTC 407 ms, axis 59 degrees, normal sinus  PFT today shows severe airway obstruction, low vital capacity, possible restriction of lung volumes  CT cardiac calcium score 10/12/19 IMPRESSION: 1. No acute findings. 2. Advanced paraseptal emphysema with bullous  changes 3. Aortic Atherosclerosis (ICD10-I70.0) and Emphysema (ICD10-J43.9).     Assessment:     Encounter Diagnoses  Name Primary?  . SOB (shortness of breath) Yes  . Pulmonary emphysema, unspecified emphysema type (Guy Evans)   . Aortic atherosclerosis ([place])   . Chronic obstructive pulmonary disease with acute exacerbation ([facility])        Plan:     We discussed his current symptoms and underlying health history.  He had a normal stress test February 2021.  He has atherosclerosis on prior CT scan as well as pulmonary emphysema on CT scan from last year  He has not really complained of respiratory symptoms till now although smoking cigarettes back in the 1990s.  He does smoke marijuana regularly.  Symptoms will suggest  symptoms of COPD emphysema  Begin trial of Trelegy, albuterol as needed.  Discussed proper use of medications.  Discussed the diagnosis of COPD/emphysema.  His oxygen was 90% today on room air   Ronak was seen today for nasal congestion.  Diagnoses and all orders for this visit:  SOB (shortness of breath) -     Spirometry with Graph -     EKG 12-Lead  Pulmonary emphysema, unspecified emphysema type (H[facility])  Aortic atherosclerosis ([facility]) -     EKG 12-Lead  Chronic obstructive pulmonary disease with acute exacerbation ([facility])  Other orders -     albuterol (VENTOLIN HFA) 108 (90 Base) MCG/ACT inhaler; Inhale 2 puffs into the lungs every 6 (six) hours as needed for wheezing or shortness of breath. -     Fluticasone-Umeclidin-Vilant (TRELEGY ELLIPTA) 100-62.5-25 MCG/INH AEPB; Inhale 1 puff into the lungs daily.  f/u 2-3 weeks

## 2021-01-07 ENCOUNTER — Other Ambulatory Visit: Payer: Self-pay | Admitting: Cardiology

## 2021-01-07 ENCOUNTER — Encounter: Payer: Self-pay | Admitting: Medical

## 2021-01-07 NOTE — Addendum Note (Signed)
Addended by: Carlena Hurl on: 01/07/2021 09:26 AM   Modules accepted: Orders

## 2021-01-13 ENCOUNTER — Inpatient Hospital Stay (HOSPITAL_COMMUNITY): Payer: Medicare HMO

## 2021-01-13 ENCOUNTER — Encounter (HOSPITAL_COMMUNITY): Payer: Self-pay | Admitting: Internal Medicine

## 2021-01-13 ENCOUNTER — Emergency Department (HOSPITAL_COMMUNITY): Payer: Medicare HMO

## 2021-01-13 ENCOUNTER — Inpatient Hospital Stay (HOSPITAL_COMMUNITY)
Admission: EM | Admit: 2021-01-13 | Discharge: 2021-01-15 | DRG: 189 | Disposition: A | Discharge status: Home / Self Care | Payer: Medicare HMO | Attending: Family Medicine | Admitting: Family Medicine

## 2021-01-13 ENCOUNTER — Encounter: Payer: Self-pay | Admitting: Medical

## 2021-01-13 ENCOUNTER — Ambulatory Visit (INDEPENDENT_AMBULATORY_CARE_PROVIDER_SITE_OTHER): Payer: Medicare HMO | Admitting: Medical

## 2021-01-13 ENCOUNTER — Other Ambulatory Visit: Payer: Self-pay

## 2021-01-13 VITALS — BP 132/72 | HR 75 | Temp 98.1°F | Ht 68.0 in | Wt 243.2 lb

## 2021-01-13 DIAGNOSIS — Z9889 Other specified postprocedural states: Secondary | ICD-10-CM

## 2021-01-13 DIAGNOSIS — J9601 Acute respiratory failure with hypoxia: Secondary | ICD-10-CM | POA: Diagnosis not present

## 2021-01-13 DIAGNOSIS — Z87891 Personal history of nicotine dependence: Secondary | ICD-10-CM | POA: Diagnosis not present

## 2021-01-13 DIAGNOSIS — I10 Essential (primary) hypertension: Secondary | ICD-10-CM | POA: Diagnosis not present

## 2021-01-13 DIAGNOSIS — Z8249 Family history of ischemic heart disease and other diseases of the circulatory system: Secondary | ICD-10-CM | POA: Diagnosis not present

## 2021-01-13 DIAGNOSIS — Z6836 Body mass index (BMI) 36.0-36.9, adult: Secondary | ICD-10-CM | POA: Diagnosis not present

## 2021-01-13 DIAGNOSIS — E785 Hyperlipidemia, unspecified: Secondary | ICD-10-CM | POA: Diagnosis not present

## 2021-01-13 DIAGNOSIS — J90 Pleural effusion, not elsewhere classified: Secondary | ICD-10-CM

## 2021-01-13 DIAGNOSIS — K449 Diaphragmatic hernia without obstruction or gangrene: Secondary | ICD-10-CM | POA: Diagnosis not present

## 2021-01-13 DIAGNOSIS — Z7951 Long term (current) use of inhaled steroids: Secondary | ICD-10-CM

## 2021-01-13 DIAGNOSIS — Z85828 Personal history of other malignant neoplasm of skin: Secondary | ICD-10-CM | POA: Diagnosis not present

## 2021-01-13 DIAGNOSIS — J439 Emphysema, unspecified: Secondary | ICD-10-CM | POA: Diagnosis not present

## 2021-01-13 DIAGNOSIS — R0902 Hypoxemia: Secondary | ICD-10-CM | POA: Insufficient documentation

## 2021-01-13 DIAGNOSIS — R911 Solitary pulmonary nodule: Secondary | ICD-10-CM | POA: Diagnosis present

## 2021-01-13 DIAGNOSIS — R0602 Shortness of breath: Secondary | ICD-10-CM | POA: Diagnosis not present

## 2021-01-13 DIAGNOSIS — J9819 Other pulmonary collapse: Secondary | ICD-10-CM | POA: Diagnosis not present

## 2021-01-13 DIAGNOSIS — R0600 Dyspnea, unspecified: Secondary | ICD-10-CM | POA: Insufficient documentation

## 2021-01-13 DIAGNOSIS — J449 Chronic obstructive pulmonary disease, unspecified: Secondary | ICD-10-CM | POA: Diagnosis not present

## 2021-01-13 DIAGNOSIS — Z20822 Contact with and (suspected) exposure to covid-19: Secondary | ICD-10-CM | POA: Diagnosis present

## 2021-01-13 DIAGNOSIS — Z79899 Other long term (current) drug therapy: Secondary | ICD-10-CM | POA: Diagnosis not present

## 2021-01-13 DIAGNOSIS — E669 Obesity, unspecified: Secondary | ICD-10-CM | POA: Diagnosis not present

## 2021-01-13 DIAGNOSIS — J438 Other emphysema: Secondary | ICD-10-CM | POA: Diagnosis not present

## 2021-01-13 DIAGNOSIS — I700 Atherosclerosis of aorta: Secondary | ICD-10-CM | POA: Diagnosis present

## 2021-01-13 DIAGNOSIS — R918 Other nonspecific abnormal finding of lung field: Secondary | ICD-10-CM | POA: Diagnosis not present

## 2021-01-13 DIAGNOSIS — K219 Gastro-esophageal reflux disease without esophagitis: Secondary | ICD-10-CM | POA: Diagnosis not present

## 2021-01-13 DIAGNOSIS — I2510 Atherosclerotic heart disease of native coronary artery without angina pectoris: Secondary | ICD-10-CM | POA: Diagnosis not present

## 2021-01-13 DIAGNOSIS — Z23 Encounter for immunization: Secondary | ICD-10-CM | POA: Diagnosis not present

## 2021-01-13 DIAGNOSIS — J9811 Atelectasis: Secondary | ICD-10-CM | POA: Diagnosis not present

## 2021-01-13 DIAGNOSIS — Z9049 Acquired absence of other specified parts of digestive tract: Secondary | ICD-10-CM | POA: Diagnosis not present

## 2021-01-13 DIAGNOSIS — R846 Abnormal cytological findings in specimens from respiratory organs and thorax: Secondary | ICD-10-CM | POA: Diagnosis not present

## 2021-01-13 DIAGNOSIS — N400 Enlarged prostate without lower urinary tract symptoms: Secondary | ICD-10-CM | POA: Diagnosis not present

## 2021-01-13 LAB — BASIC METABOLIC PANEL
Anion gap: 7 (ref 5–15)
BUN: 11 mg/dL (ref 8–23)
CO2: 26 mmol/L (ref 22–32)
Calcium: 9.1 mg/dL (ref 8.9–10.3)
Chloride: 104 mmol/L (ref 98–111)
Creatinine, Ser: 0.93 mg/dL (ref 0.61–1.24)
GFR, Estimated: >60 mL/min (ref >60)
Glucose, Bld: 110 mg/dL — ABNORMAL HIGH (ref 70–99)
Potassium: 3.9 mmol/L (ref 3.5–5.1)
Sodium: 137 mmol/L (ref 135–145)

## 2021-01-13 LAB — CBC WITH DIFFERENTIAL/PLATELET
Abs Immature Granulocytes: 0.02 10*3/uL (ref 0.00–0.07)
Basophils Absolute: 0.1 10*3/uL (ref 0.0–0.1)
Basophils Relative: 1 %
Eosinophils Absolute: 0.1 10*3/uL (ref 0.0–0.5)
Eosinophils Relative: 2 %
HCT: 39.8 % (ref 39.0–52.0)
Hemoglobin: 13.1 g/dL (ref 13.0–17.0)
Immature Granulocytes: 0 %
Lymphocytes Relative: 16 %
Lymphs Abs: 1.2 10*3/uL (ref 0.7–4.0)
MCH: 30.8 pg (ref 26.0–34.0)
MCHC: 32.9 g/dL (ref 30.0–36.0)
MCV: 93.6 fL (ref 80.0–100.0)
Monocytes Absolute: 0.7 10*3/uL (ref 0.1–1.0)
Monocytes Relative: 10 %
Neutro Abs: 5.1 10*3/uL (ref 1.7–7.7)
Neutrophils Relative %: 71 %
Platelets: 268 10*3/uL (ref 150–400)
RBC: 4.25 MIL/uL (ref 4.22–5.81)
RDW: 12.9 % (ref 11.5–15.5)
WBC: 7.1 10*3/uL (ref 4.0–10.5)
nRBC: 0 % (ref 0.0–0.2)

## 2021-01-13 LAB — HEPATIC FUNCTION PANEL
ALT: 15 U/L (ref 0–44)
AST: 15 U/L (ref 15–41)
Albumin: 3.4 g/dL — ABNORMAL LOW (ref 3.5–5.0)
Alkaline Phosphatase: 94 U/L (ref 38–126)
Bilirubin, Direct: 0.1 mg/dL (ref 0.0–0.2)
Indirect Bilirubin: 0.5 mg/dL (ref 0.3–0.9)
Total Bilirubin: 0.6 mg/dL (ref 0.3–1.2)
Total Protein: 6.8 g/dL (ref 6.5–8.1)

## 2021-01-13 LAB — LIPASE, BLOOD: Lipase: 25 U/L (ref 11–51)

## 2021-01-13 LAB — RESP PANEL BY RT-PCR (FLU A&B, COVID) ARPGX2
Influenza A by PCR: NEGATIVE
Influenza B by PCR: NEGATIVE
SARS Coronavirus 2 by RT PCR: NEGATIVE

## 2021-01-13 LAB — TROPONIN I (HIGH SENSITIVITY)
Troponin I (High Sensitivity): 5 ng/L (ref <18)
Troponin I (High Sensitivity): 5 ng/L

## 2021-01-13 LAB — BRAIN NATRIURETIC PEPTIDE: B Natriuretic Peptide: 33.2 pg/mL (ref 0.0–100.0)

## 2021-01-13 LAB — HIV ANTIBODY (ROUTINE TESTING W REFLEX): HIV Screen 4th Generation wRfx: NONREACTIVE

## 2021-01-13 LAB — LACTIC ACID, PLASMA: Lactic Acid, Venous: 0.9 mmol/L (ref 0.5–1.9)

## 2021-01-13 MED ORDER — TERAZOSIN HCL 5 MG PO CAPS
10.0000 mg | ORAL_CAPSULE | Freq: Every day | ORAL | Status: DC
  Administered 2021-01-14 – 2021-01-15 (×2): 10 mg via ORAL
  Filled 2021-01-13 (×2): qty 2

## 2021-01-13 MED ORDER — ENOXAPARIN SODIUM 40 MG/0.4ML IJ SOSY
40.0000 mg | PREFILLED_SYRINGE | INTRAMUSCULAR | Status: DC
  Administered 2021-01-13: 40 mg via SUBCUTANEOUS
  Filled 2021-01-13: qty 0.4

## 2021-01-13 MED ORDER — GLUCOSAMINE-CHONDROITIN 500-400 MG PO TABS: 2.0000 | ORAL_TABLET | ORAL | Status: DC

## 2021-01-13 MED ORDER — BENZONATATE 100 MG PO CAPS
100.0000 mg | ORAL_CAPSULE | Freq: Two times a day (BID) | ORAL | Status: DC
  Administered 2021-01-13 – 2021-01-15 (×5): 100 mg via ORAL
  Filled 2021-01-13 (×5): qty 1

## 2021-01-13 MED ORDER — ACETAMINOPHEN 325 MG PO TABS: 650.0000 mg | ORAL_TABLET | Freq: Four times a day (QID) | ORAL | Status: DC | PRN

## 2021-01-13 MED ORDER — IPRATROPIUM BROMIDE HFA 17 MCG/ACT IN AERS
2.0000 | INHALATION_SPRAY | Freq: Once | RESPIRATORY_TRACT | Status: AC
  Administered 2021-01-13: 2 via RESPIRATORY_TRACT
  Filled 2021-01-13: qty 12.9

## 2021-01-13 MED ORDER — PANTOPRAZOLE SODIUM 40 MG PO TBEC
40.0000 mg | DELAYED_RELEASE_TABLET | Freq: Every day | ORAL | Status: DC
  Administered 2021-01-14 – 2021-01-15 (×2): 40 mg via ORAL
  Filled 2021-01-13 (×2): qty 1

## 2021-01-13 MED ORDER — PNEUMOCOCCAL VAC POLYVALENT 25 MCG/0.5ML IJ INJ
0.5000 mL | INJECTION | INTRAMUSCULAR | Status: DC
  Filled 2021-01-13: qty 0.5

## 2021-01-13 MED ORDER — ALBUTEROL SULFATE HFA 108 (90 BASE) MCG/ACT IN AERS
4.0000 | INHALATION_SPRAY | Freq: Once | RESPIRATORY_TRACT | Status: AC
  Administered 2021-01-13: 4 via RESPIRATORY_TRACT
  Filled 2021-01-13: qty 6.7

## 2021-01-13 MED ORDER — IOHEXOL 350 MG/ML SOLN
50.0000 mL | Freq: Once | INTRAVENOUS | Status: AC | PRN
  Administered 2021-01-13: 50 mL via INTRAVENOUS

## 2021-01-13 MED ORDER — TURMERIC 500 MG PO CAPS: 1000.0000 mg | ORAL_CAPSULE | Freq: Every day | ORAL | Status: DC

## 2021-01-13 MED ORDER — FLUTICASONE-UMECLIDIN-VILANT 100-62.5-25 MCG/INH IN AEPB: 1.0000 | INHALATION_SPRAY | Freq: Every day | RESPIRATORY_TRACT | Status: DC

## 2021-01-13 MED ORDER — ATORVASTATIN CALCIUM 80 MG PO TABS
80.0000 mg | ORAL_TABLET | Freq: Every day | ORAL | Status: DC
  Administered 2021-01-14 – 2021-01-15 (×2): 80 mg via ORAL
  Filled 2021-01-13: qty 2
  Filled 2021-01-13: qty 1

## 2021-01-13 MED ORDER — ALBUTEROL SULFATE HFA 108 (90 BASE) MCG/ACT IN AERS
2.0000 | INHALATION_SPRAY | Freq: Four times a day (QID) | RESPIRATORY_TRACT | Status: DC | PRN
  Filled 2021-01-13: qty 6.7

## 2021-01-13 MED ORDER — ACETAMINOPHEN 650 MG RE SUPP: 650.0000 mg | Freq: Four times a day (QID) | RECTAL | Status: DC | PRN

## 2021-01-13 MED ORDER — UMECLIDINIUM-VILANTEROL 62.5-25 MCG/INH IN AEPB
1.0000 | INHALATION_SPRAY | Freq: Every day | RESPIRATORY_TRACT | Status: DC
  Administered 2021-01-14 – 2021-01-15 (×2): 1 via RESPIRATORY_TRACT
  Filled 2021-01-13: qty 14

## 2021-01-13 NOTE — H&P (Signed)
History and Physical    Guy Evans GYK:[id number] DOB: 10/08/1950 DOA: 01/13/2021  PCP: Carlena Hurl, PA-C (Confirm with patient/family/NH records and if not entered, this has to be entered at [facility] point of entry) Patient coming from: Home  I have personally briefly reviewed patient's old medical records in [place]  Chief Complaint: Cough, SOB  HPI: Guy Evans is a 70 y.o. male with medical history significant of HTN, recently diagnosed COPD, presented with 3 to 4 weeks dry cough and increasing shortness of breath.  Denies any fever chills, no weight loss no night sweat.  Went to see PCP 1 week ago, was diagnosed with COPD/bronchitis and was started on albuterol PRN and Trelegy.  Despite taking all his medications, no significant improvement.  Today, patient went back to PCP for follow-up, was found to have new onset of hypoxia and right-sided pleural effusion and sent to ED.  Chest x-ray in the ED showed large right-sided pleural effusion. ED Course: O2 saturation 85% on room air, stabilized on 3 L with saturation around 90 to 92%.  Chest x-ray showed large right-sided pleural effusion and near opacification of the right lung.  WBC within normal limits.  Review of Systems: As per HPI otherwise 14 point review of systems negative.    Past Medical History:  Diagnosis Date  . Enlarged prostate    takes Terazosin daily  . Gastroparesis   . GERD (gastroesophageal reflux disease)    occasionally but no meds required  . H/O cardiovascular stress test 10/2019   nuclear, normal, Dr. Fransico Him  . History of colon polyps   . History of gastric ulcer   . Joint pain    knees and ankles  . Urinary frequency     Past Surgical History:  Procedure Laterality Date  . CHOLECYSTECTOMY N/A 11/19/2013   Procedure: LAPAROSCOPIC CHOLECYSTECTOMY;  Surgeon: Harl Bowie, MD;  Location: [facility];  Service: General;  Laterality: N/A;  . COLONOSCOPY    . ESOPHAGOGASTRODUODENOSCOPY     . skin spots removed and tested     done in the office     reports that he has quit smoking. He has never used smokeless tobacco. He reports current alcohol use of about 12.0 standard drinks of alcohol per week. He reports current drug use. Drug: Marijuana.  No Known Allergies  Family History  Problem Relation Age of Onset  . Dementia Mother   . Heart disease Father 58       MI at 32yo, angina  . Obesity Father   . Cancer Neg Hx   . Stroke Neg Hx   . Diabetes Neg Hx      Prior to Admission medications   Medication Sig Start Date End Date Taking? Authorizing Provider  albuterol (VENTOLIN HFA) 108 (90 Base) MCG/ACT inhaler Inhale 2 puffs into the lungs every 6 (six) hours as needed for wheezing or shortness of breath. 01/06/21   Tysinger, Camelia Eng, PA-C  atorvastatin (LIPITOR) 80 MG tablet Take 1 tablet (80 mg total) by mouth daily. Please make overdue appt with Dr. Radford Pax before anymore refills. Thank you 1st attempt 01/08/21   Sueanne Margarita, MD  Fluticasone-Umeclidin-Vilant (TRELEGY ELLIPTA) 100-62.5-25 MCG/INH AEPB Inhale 1 puff into the lungs daily. 01/06/21   Tysinger, Camelia Eng, PA-C  glucosamine-chondroitin 500-400 MG tablet Take 2 tablets by mouth 1 day or 1 dose.    [provider]  omeprazole (PRILOSEC) 20 MG capsule Take by mouth. 12/09/10   [provider]  terazosin (HYTRIN) 10 MG capsule TAKE 1 CAPSULE BY MOUTH EVERY DAY 12/26/20   Tysinger, Camelia Eng, PA-C  Turmeric (QC TUMERIC COMPLEX PO) Take 1,000 mg by mouth daily.    [provider]    Physical Exam: Vitals:   01/13/21 1130 01/13/21 1131 01/13/21 1245  BP: 132/67  130/69  Pulse: 75  82  Resp: 16  18  Temp: (!) 96.6 F (35.9 C)    TempSrc: Oral    SpO2: 90%  92%  Weight:  110.3 kg   Height:  5\' 8"  (1.727 m)     Constitutional: NAD, calm, comfortable Vitals:   01/13/21 1130 01/13/21 1131 01/13/21 1245  BP: 132/67  130/69  Pulse: 75  82  Resp: 16  18  Temp: (!) 96.6 F (35.9 C)     TempSrc: Oral    SpO2: 90%  92%  Weight:  110.3 kg   Height:  5\' 8"  (1.727 m)    Eyes: PERRL, lids and conjunctivae normal ENMT: Mucous membranes are moist. Posterior pharynx clear of any exudate or lesions.Normal dentition.  Neck: normal, supple, no masses, no thyromegaly, no lymphadenopathy Respiratory: Diminished breathing sound on the right side, no wheezing, no crackles.  Increasing respiratory effort. No accessory muscle use.  Cardiovascular: Regular rate and rhythm, no murmurs / rubs / gallops. No extremity edema. 2+ pedal pulses. No carotid bruits.  Abdomen: no tenderness, no masses palpated. No hepatosplenomegaly. Bowel sounds positive.  Musculoskeletal: no clubbing / cyanosis. No joint deformity upper and lower extremities. Good ROM, no contractures. Normal muscle tone.  Skin: no rashes, lesions, ulcers. No induration Neurologic: CN 2-12 grossly intact. Sensation intact, DTR normal. Strength 5/5 in all 4.  Psychiatric: Normal judgment and insight. Alert and oriented x 3. Normal mood.     Labs on Admission: I have personally reviewed following labs and imaging studies  CBC: Recent Labs  Lab 01/13/21 1130  WBC 7.1  NEUTROABS 5.1  HGB 13.1  HCT 39.8  MCV 93.6  PLT 811   Basic Metabolic Panel: Recent Labs  Lab 01/13/21 1130  NA 137  K 3.9  CL 104  CO2 26  GLUCOSE 110*  BUN 11  CREATININE 0.93  CALCIUM 9.1   GFR: Estimated Creatinine Clearance: 89.1 mL/min (by C-G formula based on SCr of 0.93 mg/dL). Liver Function Tests: Recent Labs  Lab 01/13/21 1130  AST 15  ALT 15  ALKPHOS 94  BILITOT 0.6  PROT 6.8  ALBUMIN 3.4*   Recent Labs  Lab 01/13/21 1130  LIPASE 25   No results for input(s): AMMONIA in the last 168 hours. Coagulation Profile: No results for input(s): INR, PROTIME in the last 168 hours. Cardiac Enzymes: No results for input(s): CKTOTAL, CKMB, CKMBINDEX, TROPONINI in the last 168 hours. BNP (last 3 results) No results for input(s):  PROBNP in the last 8760 hours. HbA1C: No results for input(s): HGBA1C in the last 72 hours. CBG: No results for input(s): GLUCAP in the last 168 hours. Lipid Profile: No results for input(s): CHOL, HDL, LDLCALC, TRIG, CHOLHDL, LDLDIRECT in the last 72 hours. Thyroid Function Tests: No results for input(s): TSH, T4TOTAL, FREET4, T3FREE, THYROIDAB in the last 72 hours. Anemia Panel: No results for input(s): VITAMINB12, FOLATE, FERRITIN, TIBC, IRON, RETICCTPCT in the last 72 hours. Urine analysis: No results found for: COLORURINE, APPEARANCEUR, [place], [place], GLUCOSEU, HGBUR, BILIRUBINUR, KETONESUR, PROTEINUR, UROBILINOGEN, NITRITE, LEUKOCYTESUR  Radiological Exams on Admission: DG Chest Portable 1 View  Result Date: 01/13/2021 CLINICAL DATA:  Shortness of breath started 3 weeks ago. EXAM: PORTABLE CHEST 1 VIEW COMPARISON:  CT of the chest on 10/12/2019 FINDINGS: There is near complete opacification of RIGHT hemithorax with small amount of aerated lung in the perihilar region. The significant opacity in the RIGHT hemithorax obscures the RIGHT heart margin. Heart is probably normal in size.  LEFT lung is clear. IMPRESSION: Near complete opacification of the RIGHT hemithorax. Recommend further evaluation with CT of the chest. Electronically Signed   By: Nolon Nations M.D.   On: 01/13/2021 11:59    EKG: Independently reviewed. Sinus, no acute ST-T changes.  Assessment/Plan Active Problems:   Hypoxia  (please populate well all problems here in Problem List. (For example, if patient is on BP meds at home and you resume or decide to hold them, it is a problem that needs to be her. Same for CAD, COPD, HLD and so on)  Acute hypoxic respite failure -Secondary to large right-sided pleural effusion and collapse of the right lung. -Pulmonology consulted, CT chest ordered and pulmonary plans for subsequent thoracentesis and work-up to rule out malignancy. -Continue as needed albuterol and  Trelegy.  Question of COPD -Outpatient pulmonology for lung function test  HTN -Prazosin.  DVT prophylaxis: Lovenox Code Status: Full Code Family Communication: None at bedside Disposition Plan: Expect more than 2 midnight hospital stay to wean down oxygen, may need bronchoscopy plus minus CT surgery consult and oncology consult. Consults called: Pulmonology Admission status: Telemetry admission   Lequita Halt MD [facility] Pager [phone]  01/13/2021, 1:02 PM

## 2021-01-13 NOTE — ED Provider Notes (Signed)
[facility] EMERGENCY DEPARTMENT Provider Note   CSN: [id number] Arrival date & time: 01/13/21  1115     History Chief Complaint  Patient presents with  . Shortness of Breath    Guy Evans is a 70 y.o. male.  Patient sent over from primary care doctor's office.  Has been undergoing treatment for COPD exacerbation.  Former smoking.  Was hypoxic in the office.  Hypoxic to the low 80s upon arrival here.  Improved on 3 L of oxygen.  Has had gradual shortness of breath and cough and wheezing over the last several weeks.  The history is provided by the patient.  Shortness of Breath Severity:  Moderate Onset quality:  Gradual Timing:  Constant Progression:  Worsening Chronicity:  New Context: activity   Relieved by:  Nothing Worsened by:  Nothing Associated symptoms: cough and sputum production   Associated symptoms: no abdominal pain, no chest pain, no claudication, no ear pain, no fever, no rash, no sore throat and no vomiting   Risk factors: no hx of PE/DVT        Past Medical History:  Diagnosis Date  . Enlarged prostate    takes Terazosin daily  . Gastroparesis   . GERD (gastroesophageal reflux disease)    occasionally but no meds required  . H/O cardiovascular stress test 10/2019   nuclear, normal, Dr. Fransico Him  . History of colon polyps   . History of gastric ulcer   . Joint pain    knees and ankles  . Urinary frequency     Patient Active Problem List   Diagnosis Date Noted  . Dyspnea 01/13/2021  . Abnormal lung field 01/13/2021  . Hypoxemia 01/13/2021  . SOB (shortness of breath) 01/06/2021  . Pulmonary emphysema ([place]) 01/06/2021  . Chronic obstructive pulmonary disease with acute exacerbation ([place]) 01/06/2021  . Screening for diabetes mellitus 11/05/2020  . History of skin cancer 11/05/2020  . Gastroesophageal reflux disease 11/05/2020  . Plantar fasciitis 11/05/2020  . Encounter for screening for vascular disease 11/05/2020   . BMI 37.0-37.9, adult 10/20/2020  . Advanced directives, counseling/discussion 10/20/2020  . Loss of height 10/20/2020  . Screening for osteoporosis 10/20/2020  . Aortic atherosclerosis ([place]) 10/20/2020  . Abnormal CT lung screening 10/20/2020  . Abnormal CT of the chest 10/20/2020  . Coronary artery disease involving native heart without angina pectoris 10/20/2020  . Need for influenza vaccination 07/19/2018  . Colonoscopy refused 04/27/2018  . Benign prostatic hyperplasia 04/03/2018  . Hyperlipidemia 01/17/2017  . Hemorrhoids 01/17/2017  . Encounter for health maintenance examination in adult 10/18/2016  . Medicare annual wellness visit, subsequent 10/18/2016  . Barrett's esophagus without dysplasia 10/18/2016  . Polyp of colon 10/18/2016  . Vaccine counseling 10/18/2016  . Screen for colon cancer 10/18/2016  . Family history of premature CAD 10/18/2016    Past Surgical History:  Procedure Laterality Date  . CHOLECYSTECTOMY N/A 11/19/2013   Procedure: LAPAROSCOPIC CHOLECYSTECTOMY;  Surgeon: Harl Bowie, MD;  Location: [facility];  Service: General;  Laterality: N/A;  . COLONOSCOPY    . ESOPHAGOGASTRODUODENOSCOPY    . skin spots removed and tested     done in the office       Family History  Problem Relation Age of Onset  . Dementia Mother   . Heart disease Father 5       MI at 28yo, angina  . Obesity Father   . Cancer Neg Hx   . Stroke Neg Hx   .  Diabetes Neg Hx     Social History   Tobacco Use  . Smoking status: Former Research scientist (life sciences)  . Smokeless tobacco: Never Used  . Tobacco comment: quit smoking around 1993  Substance Use Topics  . Alcohol use: Yes    Alcohol/week: 12.0 standard drinks    Types: 6 Glasses of wine, 6 Shots of liquor per week    Comment: none since gallbladder issues  . Drug use: Yes    Types: Marijuana    Home Medications Prior to Admission medications   Medication Sig Start Date End Date Taking? Authorizing Provider  albuterol  (VENTOLIN HFA) 108 (90 Base) MCG/ACT inhaler Inhale 2 puffs into the lungs every 6 (six) hours as needed for wheezing or shortness of breath. 01/06/21   Tysinger, Camelia Eng, PA-C  atorvastatin (LIPITOR) 80 MG tablet Take 1 tablet (80 mg total) by mouth daily. Please make overdue appt with Dr. Radford Pax before anymore refills. Thank you 1st attempt 01/08/21   Sueanne Margarita, MD  Fluticasone-Umeclidin-Vilant (TRELEGY ELLIPTA) 100-62.5-25 MCG/INH AEPB Inhale 1 puff into the lungs daily. 01/06/21   Tysinger, Camelia Eng, PA-C  glucosamine-chondroitin 500-400 MG tablet Take 2 tablets by mouth 1 day or 1 dose.    [provider]  omeprazole (PRILOSEC) 20 MG capsule Take by mouth. 12/09/10   [provider]  terazosin (HYTRIN) 10 MG capsule TAKE 1 CAPSULE BY MOUTH EVERY DAY 12/26/20   Tysinger, Camelia Eng, PA-C  Turmeric (QC TUMERIC COMPLEX PO) Take 1,000 mg by mouth daily.    [provider]    Allergies    Patient has no known allergies.  Review of Systems   Review of Systems  Constitutional: Negative for chills and fever.  HENT: Negative for ear pain and sore throat.   Eyes: Negative for pain and visual disturbance.  Respiratory: Positive for cough, sputum production and shortness of breath.   Cardiovascular: Negative for chest pain, palpitations and claudication.  Gastrointestinal: Negative for abdominal pain and vomiting.  Genitourinary: Negative for dysuria and hematuria.  Musculoskeletal: Negative for arthralgias and back pain.  Skin: Negative for color change and rash.  Neurological: Negative for seizures and syncope.  All other systems reviewed and are negative.   Physical Exam Updated Vital Signs  ED Triage Vitals  Enc Vitals Group     BP 01/13/21 1130 132/67     Pulse Rate 01/13/21 1130 75     Resp 01/13/21 1130 16     Temp 01/13/21 1130 (!) 96.6 F (35.9 C)     Temp Source 01/13/21 1130 Oral     SpO2 01/13/21 1130 90 %     Weight 01/13/21 1131 243 lb 2.7 oz (110.3  kg)     Height 01/13/21 1131 5\' 8"  (1.727 m)     Head Circumference --      Peak Flow --      Pain Score 01/13/21 1130 1     Pain Loc --      Pain Edu? --      Excl. in [place]? --     Physical Exam Vitals and nursing note reviewed.  Constitutional:      General: He is not in acute distress.    Appearance: He is well-developed. He is not ill-appearing.  HENT:     Head: Normocephalic and atraumatic.  Eyes:     Conjunctiva/sclera: Conjunctivae normal.     Pupils: Pupils are equal, round, and reactive to light.  Cardiovascular:     Rate and  Rhythm: Normal rate and regular rhythm.     Pulses: Normal pulses.     Heart sounds: Normal heart sounds. No murmur heard.   Pulmonary:     Effort: Tachypnea present. No respiratory distress.     Breath sounds: Decreased breath sounds and wheezing present.  Abdominal:     Palpations: Abdomen is soft.     Tenderness: There is no abdominal tenderness.  Musculoskeletal:     Cervical back: Normal range of motion and neck supple.     Right lower leg: Edema (trace) present.     Left lower leg: Edema (trace) present.  Skin:    General: Skin is warm and dry.     Capillary Refill: Capillary refill takes less than 2 seconds.  Neurological:     Mental Status: He is alert.     ED Results / Procedures / Treatments   Labs (all labs ordered are listed, but only abnormal results are displayed) Labs Reviewed  BASIC METABOLIC PANEL - Abnormal; Notable for the following components:      Result Value   Glucose, Bld 110 (*)    All other components within normal limits  HEPATIC FUNCTION PANEL - Abnormal; Notable for the following components:   Albumin 3.4 (*)    All other components within normal limits  RESP PANEL BY RT-PCR (FLU A&B, COVID) ARPGX2  CULTURE, BLOOD (SINGLE)  CBC WITH DIFFERENTIAL/PLATELET  LIPASE, BLOOD  BRAIN NATRIURETIC PEPTIDE  LACTIC ACID, PLASMA  LACTIC ACID, PLASMA  TROPONIN I (HIGH SENSITIVITY)    EKG EKG  Interpretation  Date/Time:  Tuesday Jan 13 2021 11:25:29 EDT Ventricular Rate:  85 PR Interval:  119 QRS Duration: 93 QT Interval:  368 QTC Calculation: 438 R Axis:   55 Text Interpretation: Sinus rhythm Borderline short PR interval Confirmed by Lennice Sites ([id number]) on 01/13/2021 11:27:23 AM   Radiology DG Chest Portable 1 View  Result Date: 01/13/2021 CLINICAL DATA:  Shortness of breath started 3 weeks ago. EXAM: PORTABLE CHEST 1 VIEW COMPARISON:  CT of the chest on 10/12/2019 FINDINGS: There is near complete opacification of RIGHT hemithorax with small amount of aerated lung in the perihilar region. The significant opacity in the RIGHT hemithorax obscures the RIGHT heart margin. Heart is probably normal in size.  LEFT lung is clear. IMPRESSION: Near complete opacification of the RIGHT hemithorax. Recommend further evaluation with CT of the chest. Electronically Signed   By: Nolon Nations M.D.   On: 01/13/2021 11:59    Procedures .Critical Care Performed by: Lennice Sites, DO Authorized by: Lennice Sites, DO   Critical care provider statement:    Critical care time (minutes):  45   Critical care was necessary to treat or prevent imminent or life-threatening deterioration of the following conditions:  Respiratory failure   Critical care was time spent personally by me on the following activities:  Blood draw for specimens, development of treatment plan with patient or surrogate, discussions with consultants, discussions with primary provider, evaluation of patient's response to treatment, examination of patient, obtaining history from patient or surrogate, ordering and performing treatments and interventions, ordering and review of laboratory studies, ordering and review of radiographic studies, pulse oximetry, re-evaluation of patient's condition and review of old charts   I assumed direction of critical care for this patient from another provider in my specialty: no     Care  discussed with: admitting provider       Medications Ordered in ED Medications  albuterol (VENTOLIN HFA) 108 (90 Base)  MCG/ACT inhaler 4 puff (4 puffs Inhalation Given 01/13/21 1138)  ipratropium (ATROVENT HFA) inhaler 2 puff (2 puffs Inhalation Given 01/13/21 1138)    ED Course  I have reviewed the triage vital signs and the nursing notes.  Pertinent labs & imaging results that were available during my care of the patient were reviewed by me and considered in my medical decision making (see chart for details).    MDM Rules/Calculators/A&P                          Ules Marsala is a 70 year old male with history of reflux who presents to the ED with shortness of breath.  Patient was at primary care doctor's office.  He was treated for COPD last week and there for reevaluation.  Patient found to be hypoxic with worsening respiratory status.  Patient on room air here is in the low 80s.  Improves with several liters of fluid.  Has diminished breath sounds throughout with wheezing throughout.  Bedside chest x-ray shows large right-sided pleural effusion.  Denies any history of cancer.  Has not had this before.  Former history of smoking.  Will get lab work including 1 blood culture and BNP and troponin.  EKG shows sinus rhythm.  No ischemic changes.  He does have some edema on his legs.  Symptoms have been gradual over the past month.  This could be secondary to infection or heart failure or cancerous process.  Will touch base with pulmonology to see if they want to be involved in his care or if they prefer IR.  X-ray with large right-sided pleural effusion.  Pulmonology was called and they will come down to evaluate the patient and perform thoracentesis and give further recommendations.  Lab work was otherwise unremarkable.  Troponin normal.  No significant leukocytosis.  Pleural effusion due to unknown etiology causing respiratory failure and hypoxia.  High suspicion for possibly cancerous process  given smoking history.  No fever, no white count.  Seems less likely to be infectious.  Troponin normal and recent stress test last year unremarkable and seems to be unlikely heart failure related.  To be admitted to medicine for further care.  Appreciate pulmonology recommendations.  This chart was dictated using voice recognition software.  Despite best efforts to proofread,  errors can occur which can change the documentation meaning.    Final Clinical Impression(s) / ED Diagnoses Final diagnoses:  Acute respiratory failure with hypoxia (HCC)  Pleural effusion    Rx / DC Orders ED Discharge Orders    None       Lennice Sites, DO 01/13/21 1231

## 2021-01-13 NOTE — ED Triage Notes (Signed)
SOB started 3 weeks ago. Seen by MD last week, given new inhaler. SOB getting worse, also has chest pain 1/10 dull in nature.O2 sat 85%

## 2021-01-13 NOTE — ED Notes (Signed)
On room air, patient's oxygen sats were at 85% and was wheezing. Pt was placed on 2L [place] and oxygen sats improved to 92% and pt stated that he felt better after given the oxygen.

## 2021-01-13 NOTE — Progress Notes (Signed)
Patient arrived to unit. Skin assessed by this nurse and charge nurse Caryl Pina. Patient ambulated from stretcher to bed with no assist. Introduced to room, hospital policies and plan of care. All questions answered at this time.

## 2021-01-13 NOTE — Consult Note (Signed)
NAME:  Guy Evans, MRN:  [id number], DOB:  02/07/1951, LOS: 0 ADMISSION DATE:  01/13/2021, CONSULTATION DATE:  01/13/21 REFERRING MD:  EDP, CHIEF COMPLAINT:  Pleural effusion    History of Present Illness:  70yo male former smoker (quit 1993) with hx HTN, ?COPD presents with 3 week hx SOB.  He was seen by PCP 1 week ago and given new inhaler.  Seen again on date of admission and was sent to ER given obvious dyspnea and mild hypoxia in office.  In ER sats 85% on RA, CXR revealed large R pleural effusion with near complete opacification R hemithorax and PCCM consulted for further eval and possible thoracentesis.   Pertinent  Medical History   has a past medical history of Enlarged prostate, Gastroparesis, GERD (gastroesophageal reflux disease), H/O cardiovascular stress test (10/2019), History of colon polyps, History of gastric ulcer, Joint pain, and Urinary frequency.   Significant Hospital Events: Including procedures, antibiotic start and stop dates in addition to other pertinent events   . Spirometry 01/06/21 - FEV1 38% pred, FVC 1.5L  Interim History / Subjective:  WNWDM nad  Objective   Blood pressure 132/67, pulse 75, temperature (!) 96.6 F (35.9 C), temperature source Oral, resp. rate 16, height 5\' 8"  (1.727 m), weight 110.3 kg, SpO2 90 %.       No intake or output data in the 24 hours ending 01/13/21 1217 Filed Weights   01/13/21 1131  Weight: 110.3 kg    Examination: General: Obese male NAD at rest HENT: no jvd Lungs: Decreased on rt Cardiovascular: HSR RRR Abdomen: obese soft + bs Extremities: warm and dry Neuro: intact GU: voids  Labs/imaging that I havepersonally reviewed  (right click and "Reselect all SmartList Selections" daily)  CXR - large R effusion with essentially complete opacification R hemithorax CBC  Chem pending   Resolved Hospital Problem list     Assessment & Plan:  Large Right Pleural Effusion Hypoxic respiratory failure  Hx of tobacco  abuse   Of note had cardiac CT 10/11/20 c/w paraseptal/ bullous emphysema, also showing a subpleural, partially calcified paramediastinal nodule within the right lung measures 0.9 cm  PLAN -  - bedside evaluation with ultrasound of effusion, w/ plans to proceed with bedside diagnostic and therapeutic thoracentesis after ct chest..   - will send pleural fluid for lytes and cytology.  Cytology is 50/50 chance of showing malignant cells on first thoracentesis.  - CT of chest following thoracentesis for better visualization of underlying lung parenchyma - will need to monitor for re-accumulation - intermittent CXR  -supplemental O2 as needed to keep sats >92% - If admitted, change trelegy to duonebs with pulmicort - given hx of GERD, consider SLP consult to rule out aspiration component   Labs   CBC: Recent Labs  Lab 01/13/21 1130  WBC 7.1  NEUTROABS 5.1  HGB 13.1  HCT 39.8  MCV 93.6  PLT 734    Basic Metabolic Panel: No results for input(s): NA, K, CL, CO2, GLUCOSE, BUN, CREATININE, CALCIUM, MG, PHOS in the last 168 hours. GFR: CrCl cannot be calculated (Patient's most recent lab result is older than the maximum 21 days allowed.). Recent Labs  Lab 01/13/21 1130  WBC 7.1    Liver Function Tests: No results for input(s): AST, ALT, ALKPHOS, BILITOT, PROT, ALBUMIN in the last 168 hours. No results for input(s): LIPASE, AMYLASE in the last 168 hours. No results for input(s): AMMONIA in the last 168 hours.  ABG No results found  for: PHART, PCO2ART, PO2ART, HCO3, TCO2, ACIDBASEDEF, O2SAT   Coagulation Profile: No results for input(s): INR, PROTIME in the last 168 hours.  Cardiac Enzymes: No results for input(s): CKTOTAL, CKMB, CKMBINDEX, TROPONINI in the last 168 hours.  HbA1C: Hgb A1c MFr Bld  Date/Time Value Ref Range Status  04/04/2018 09:17 AM 5.4 4.8 - 5.6 % Final    Comment:             Prediabetes: 5.7 - 6.4          Diabetes: >6.4          Glycemic control for  adults with diabetes: <7.0   10/18/2016 10:27 AM 5.0 <5.7 % Final    Comment:      For the purpose of screening for the presence of diabetes:   <5.7%       Consistent with the absence of diabetes 5.7-6.4 %   Consistent with increased risk for diabetes (prediabetes) >=6.5 %     Consistent with diabetes   This assay result is consistent with a decreased risk of diabetes.   Currently, no consensus exists regarding use of hemoglobin A1c for diagnosis of diabetes in children.   According to American Diabetes Association (ADA) guidelines, hemoglobin A1c <7.0% represents optimal control in non-pregnant diabetic patients. Different metrics may apply to specific patient populations. Standards of Medical Care in Diabetes (ADA).       CBG: No results for input(s): GLUCAP in the last 168 hours.  Review of Systems:   As per HPI - All other systems reviewed and were neg.     Past Medical History:  He,  has a past medical history of Enlarged prostate, Gastroparesis, GERD (gastroesophageal reflux disease), H/O cardiovascular stress test (10/2019), History of colon polyps, History of gastric ulcer, Joint pain, and Urinary frequency.   Surgical History:   Past Surgical History:  Procedure Laterality Date  . CHOLECYSTECTOMY N/A 11/19/2013   Procedure: LAPAROSCOPIC CHOLECYSTECTOMY;  Surgeon: Harl Bowie, MD;  Location: [place] [facility];  Service: General;  Laterality: N/A;  . COLONOSCOPY    . ESOPHAGOGASTRODUODENOSCOPY    . skin spots removed and tested     done in the office     Social History:   reports that he has quit smoking. He has never used smokeless tobacco. He reports current alcohol use of about 12.0 standard drinks of alcohol per week. He reports current drug use. Drug: Marijuana.   Family History:  His family history includes Dementia in his mother; Heart disease (age of onset: 35) in his father; Obesity in his father. There is no history of Cancer, Stroke, or Diabetes.    Allergies No Known Allergies   Home Medications  Prior to Admission medications   Medication Sig Start Date End Date Taking? Authorizing Provider  albuterol (VENTOLIN HFA) 108 (90 Base) MCG/ACT inhaler Inhale 2 puffs into the lungs every 6 (six) hours as needed for wheezing or shortness of breath. 01/06/21   Tysinger, Camelia Eng, PA-C  atorvastatin (LIPITOR) 80 MG tablet Take 1 tablet (80 mg total) by mouth daily. Please make overdue appt with Dr. Radford Pax before anymore refills. Thank you 1st attempt 01/08/21   Sueanne Margarita, MD  Fluticasone-Umeclidin-Vilant (TRELEGY ELLIPTA) 100-62.5-25 MCG/INH AEPB Inhale 1 puff into the lungs daily. 01/06/21   Tysinger, Camelia Eng, PA-C  glucosamine-chondroitin 500-400 MG tablet Take 2 tablets by mouth 1 day or 1 dose.    [provider]  omeprazole (PRILOSEC) 20 MG capsule Take by mouth. 12/09/10  [provider]  terazosin (HYTRIN) 10 MG capsule TAKE 1 CAPSULE BY MOUTH EVERY DAY 12/26/20   Tysinger, Camelia Eng, PA-C  Turmeric (QC TUMERIC COMPLEX PO) Take 1,000 mg by mouth daily.    [provider]    Gaylyn Lambert ACNP Acute Care Nurse Practitioner [place] Please consult Amion 01/13/2021, 12:39 PM

## 2021-01-13 NOTE — ED Notes (Signed)
Report called to Lovely, RN receiving pt in room 2W12.

## 2021-01-13 NOTE — Progress Notes (Signed)
Subjective:     Patient ID: Guy Evans, male   DOB: 1950/09/20, 70 y.o.   MRN: [id number]  HPI Chief Complaint  Patient presents with  . Shortness of Breath    Problems using inhaler.    Here for complaint of worsening shortness of breath.  He came in here on May 3 for similar.  At that time he did not had near the shortness of breath he has today.  At that time we entertained a diagnosis of COPD/emphysema and started Evans on inhaler based on symptoms and PFTs.  However within the last week he notes a abrupt change.  He notes worse shortness of breath, cannot lay flat on his side or back into bed.  Last night he had a lot of trouble sleeping due to dyspnea.  He has a general chest discomfort across his chest but not what he would call any chest pain.  No swelling in the legs.  Occasional cough but no fever no sore throat.  He may have a little congestion.  No sick contacts.  Last week when he came in he had reported 3 week hx/o sob, congestion.  Has some general fatigue.  When walking from basement to upstairs or to his car, huffing and puffing.  No chest pain.  Feels like a wheezing sound in chest.  At night has whistling in sinuses.   Has some nasal congestion, but in past hasn't had a big problem with pollen allergies.  Initially thought this was allergies, but symptoms has not improved.  Yesterday evening took mucinex, that helped some.  No leg swelling.  Not checking weights.   237lb a few weeks ago.   Doesn't think he has gained any weight.  Been using some Claritin as well.    Denies body aches, fever, nausea, no loss of smell or taste.    Former smoker, in the early 1990s quit.  Smokes cigars until mid 1990s.  He still smokes marijuana pretty regularly    Past Medical History:  Diagnosis Date  . Enlarged prostate    takes Terazosin daily  . Gastroparesis   . GERD (gastroesophageal reflux disease)    occasionally but no meds required  . H/O cardiovascular stress test 10/2019    nuclear, normal, Dr. Fransico Evans  . History of colon polyps   . History of gastric ulcer   . Joint pain    knees and ankles  . Urinary frequency      Current Outpatient Medications on File Prior to Visit  Medication Sig Dispense Refill  . albuterol (VENTOLIN HFA) 108 (90 Base) MCG/ACT inhaler Inhale 2 puffs into the lungs every 6 (six) hours as needed for wheezing or shortness of breath. 18 g 0  . atorvastatin (LIPITOR) 80 MG tablet Take 1 tablet (80 mg total) by mouth daily. Please make overdue appt with Dr. Radford Pax before anymore refills. Thank you 1st attempt 30 tablet 0  . Fluticasone-Umeclidin-Vilant (TRELEGY ELLIPTA) 100-62.5-25 MCG/INH AEPB Inhale 1 puff into the lungs daily. 28 each 0  . glucosamine-chondroitin 500-400 MG tablet Take 2 tablets by mouth 1 day or 1 dose.    Marland Kitchen omeprazole (PRILOSEC) 20 MG capsule Take by mouth.    . terazosin (HYTRIN) 10 MG capsule TAKE 1 CAPSULE BY MOUTH EVERY DAY 90 capsule 0  . Turmeric (QC TUMERIC COMPLEX PO) Take 1,000 mg by mouth daily.     No current facility-administered medications on file prior to visit.   Review of Systems As in subjective  Objective:   Physical Exam  BP 132/72   Pulse 75   Temp 98.1 F (36.7 C)   Ht 5\' 8"  (1.727 m)   Wt 243 lb 3.2 oz (110.3 kg)   SpO2 (!) 89%   BMI 36.98 kg/m   Wt Readings from Last 3 Encounters:  01/13/21 243 lb 3.2 oz (110.3 kg)  01/06/21 237 lb (107.5 kg)  10/20/20 244 lb 6.4 oz (110.9 kg)   General appearence: alert,  WD/WN, white male with dyspnea HEENT: normocephalic, sclerae anicteric, TMs pearly, nares patent, no discharge or erythema, pharynx normal Oral cavity: MMM, no lesions Neck: supple, no lymphadenopathy, no thyromegaly, no masses, no JVD Heart: RRR, normal S1, S2, no murmurs Lungs: Today there seems to be decreased breath sounds in the right lower fields, otherwise no wheezes, rhonchi, or rales Abdomen: +bs, soft, non tender, non distended, no masses, no  hepatomegaly, no splenomegaly Pulses: 2+ symmetric, upper and lower extremities, normal cap refill Ext: no edema Unable to answer questions in complete sentences.  EKG done today: EKG indication: shortness of breath, rate 77 bpm, pr 138ms, QRS 80ms, QTC 411 ms, 62 degrees, sinus rthyem with sinus arrthymia, no acute changes   From last visit:  PFT on 01/06/21 showed severe airway obstruction, low vital capacity, possible restriction of lung volumes  CT cardiac calcium score 10/12/19 IMPRESSION: 1. No acute findings. 2. Advanced paraseptal emphysema with bullous changes 3. Aortic Atherosclerosis (ICD 10-I70.0) and Emphysema (ICD10-J43.9).     Assessment:     Encounter Diagnoses  Name Primary?  . SOB (shortness of breath) Yes  . Pulmonary emphysema, unspecified emphysema type ([place])   . Aortic atherosclerosis ([place])   . Dyspnea, unspecified type   . Abnormal lung field        Plan:      We saw Evans last week for COPD diagnosis and started Evans on inhaler, Trelegy.  However, he has acutely different/worse symptoms in the last few days.  Compared to last visit his oxygen has dropped down acutely and he is obviously dyspneic in the office.    I recommended we send Evans via EMS to the emergency department for further evaluation.  He refuses.  He states that he drove himself here and he said he will drive himself to the emergency department now.  EKG reviewed. No acute change  He will report now to emergency dept.    Guy Evans was seen today for shortness of breath.  Diagnoses and all orders for this visit:  SOB (shortness of breath)  Pulmonary emphysema, unspecified emphysema type ([facility])  Aortic atherosclerosis ([facility])  Dyspnea, unspecified type  Abnormal lung field  Report to the emergency dept

## 2021-01-14 ENCOUNTER — Encounter: Payer: Self-pay | Admitting: Medical

## 2021-01-14 ENCOUNTER — Encounter (HOSPITAL_COMMUNITY): Payer: Self-pay | Admitting: Internal Medicine

## 2021-01-14 ENCOUNTER — Inpatient Hospital Stay (HOSPITAL_COMMUNITY): Payer: Medicare HMO

## 2021-01-14 ENCOUNTER — Other Ambulatory Visit: Payer: Self-pay

## 2021-01-14 ENCOUNTER — Encounter (HOSPITAL_COMMUNITY)
Admission: EM | Disposition: A | Discharge status: Home / Self Care | Payer: Self-pay | Source: Home / Self Care | Attending: Family Medicine

## 2021-01-14 ENCOUNTER — Ambulatory Visit: Admission: RE | Admit: 2021-01-14 | Payer: Self-pay | Source: Ambulatory Visit

## 2021-01-14 DIAGNOSIS — J90 Pleural effusion, not elsewhere classified: Secondary | ICD-10-CM | POA: Diagnosis not present

## 2021-01-14 DIAGNOSIS — R0902 Hypoxemia: Secondary | ICD-10-CM | POA: Diagnosis not present

## 2021-01-14 DIAGNOSIS — J439 Emphysema, unspecified: Secondary | ICD-10-CM

## 2021-01-14 DIAGNOSIS — N400 Enlarged prostate without lower urinary tract symptoms: Secondary | ICD-10-CM

## 2021-01-14 HISTORY — PX: THORACENTESIS: SHX235

## 2021-01-14 LAB — BODY FLUID CELL COUNT WITH DIFFERENTIAL
Eos, Fluid: 2 %
Lymphs, Fluid: 84 %
Monocyte-Macrophage-Serous Fluid: 13 % — ABNORMAL LOW (ref 50–90)
Neutrophil Count, Fluid: 1 % (ref 0–25)
Other Cells, Fluid: 1 %
Total Nucleated Cell Count, Fluid: 957 cu mm (ref 0–1000)

## 2021-01-14 LAB — PROTEIN, PLEURAL OR PERITONEAL FLUID: Total protein, fluid: 4.2 g/dL

## 2021-01-14 LAB — LACTATE DEHYDROGENASE, PLEURAL OR PERITONEAL FLUID: LD, Fluid: 150 U/L — ABNORMAL HIGH (ref 3–23)

## 2021-01-14 LAB — GLUCOSE, PLEURAL OR PERITONEAL FLUID: Glucose, Fluid: 100 mg/dL

## 2021-01-14 LAB — LACTATE DEHYDROGENASE: LDH: 110 U/L (ref 98–192)

## 2021-01-14 LAB — AMYLASE, PLEURAL OR PERITONEAL FLUID: Amylase, Fluid: 54 U/L

## 2021-01-14 LAB — PROTEIN, TOTAL: Total Protein: 6.4 g/dL — ABNORMAL LOW (ref 6.5–8.1)

## 2021-01-14 SURGERY — THORACENTESIS
Anesthesia: Moderate Sedation

## 2021-01-14 MED ORDER — LIDOCAINE HCL (PF) 1 % IJ SOLN
INTRAMUSCULAR | Status: AC
  Filled 2021-01-14: qty 30

## 2021-01-14 NOTE — Progress Notes (Signed)
NAME:  Guy Evans, MRN:  [id number], DOB:  03-26-1951, LOS: 1 ADMISSION DATE:  01/13/2021, CONSULTATION DATE:  01/13/21 REFERRING MD:  EDP, CHIEF COMPLAINT:  Pleural effusion    History of Present Illness:  70yo male former smoker (quit 1993) with hx HTN, ?COPD presents with 3 week hx SOB.  He was seen by PCP 1 week ago and given new inhaler.  Seen again on date of admission and was sent to ER given obvious dyspnea and mild hypoxia in office.  In ER sats 85% on RA, CXR revealed large R pleural effusion with near complete opacification R hemithorax and PCCM consulted for further eval and possible thoracentesis.   Pertinent  Medical History   has a past medical history of Enlarged prostate, Gastroparesis, GERD (gastroesophageal reflux disease), H/O cardiovascular stress test (10/2019), History of colon polyps, History of gastric ulcer, Joint pain, and Urinary frequency.   Significant Hospital Events: Including procedures, antibiotic start and stop dates in addition to other pertinent events   . Spirometry 01/06/21 - FEV1 38% pred, FVC 1.5L . Cardiac CT 10/11/20 c/w paraseptal/ bullous emphysema, also showing a subpleural, partially calcified paramediastinal nodule within the right lung measures 0.9 cm . CT-PA 5/10, no pulmonary embolism, no adenopathy, large right pleural effusion with large-scale right lung atelectasis.  Emphysematous change on the left.  Question debris of the right mainstem bronchus  Interim History / Subjective:   No problems reported overnight He did get his CT scan last night as above  Objective   Blood pressure (!) 117/55, pulse 74, temperature 98.7 F (37.1 C), temperature source Oral, resp. rate 20, height 5\' 8"  (1.727 m), weight 110.3 kg, SpO2 93 %.       No intake or output data in the 24 hours ending 01/14/21 1105 Filed Weights   01/13/21 1131  Weight: 110.3 kg    Examination: General: Obese, up in bedside, no distress HENT: Oropharynx clear, no  stridor Lungs: No right-sided breath sounds.  Clear on the left Cardiovascular: Regular, no murmur Abdomen: Obese, soft, nondistended, positive bowel sounds Extremities: No edema Neuro: Awake, alert, interacting appropriately, nonfocal  Labs/imaging that I havepersonally reviewed  (right click and "Reselect all SmartList Selections" daily)   CT chest as above Troponin reassuring, lactate reassuring No leukocytosis Normal renal function  Resolved Hospital Problem list     Assessment & Plan:  Large Right Pleural Effusion.  Etiology unclear but concerning for possible malignancy Hypoxic respiratory failure  Hx of tobacco abuse, emphysema on CT, probable COPD  PLAN -  -Planning for diagnostic, therapeutic thoracentesis today.  Discussed the procedure, risk, benefits with the patient and he agrees.  We will send for chemistries, cell count, cytology. -Strongly consider repeat CT chest to better characterize his right lung parenchyma once he gets reexpansion -Wean O2 as able -He was tried on Trelegy recently as an outpatient.  Do believe he has COPD.  Agree with Anoro as substitute.  He may not need the ICS component going forward.   Labs   CBC: Recent Labs  Lab 01/13/21 1130  WBC 7.1  NEUTROABS 5.1  HGB 13.1  HCT 39.8  MCV 93.6  PLT 595    Basic Metabolic Panel: Recent Labs  Lab 01/13/21 1130  NA 137  K 3.9  CL 104  CO2 26  GLUCOSE 110*  BUN 11  CREATININE 0.93  CALCIUM 9.1   GFR: Estimated Creatinine Clearance: 89.1 mL/min (by C-G formula based on SCr of 0.93 mg/dL). Recent Labs  Lab 01/13/21 1130 01/13/21 1136  WBC 7.1  --   LATICACIDVEN  --  0.9    Baltazar Apo, MD, PhD 01/14/2021, 11:05 AM [facility] [phone] or if no answer before 7:00PM call [phone] For any issues after 7:00PM please call eLink [phone]

## 2021-01-14 NOTE — Progress Notes (Signed)
PROGRESS NOTE  Pedrohenrique Mcconville  QMG:[id number] DOB: September 27, 1950 DOA: 01/13/2021 PCP: Carlena Hurl, PA-C   Brief Narrative: Calvyn Kurtzman is a 70 y.o. male with a history of tobacco use and recently diagnosed COPD who presented to his PCP with continued cough and dyspnea worsening over several weeks despite initiation of bronchodilators, found to be hypoxic and referred to the ED where CXR revealed large right-sided pleural effusion confirmed on subsequent CTA chest. PCCM was consulted, planning diagnostic and therapeutic thoracentesis, patient continued on 3L O2.   Assessment & Plan: Active Problems:   Hypoxia  Painless right-sided pleural effusion: Large. Without evidence of pneumonia. Has RF's for malignancy.  - Diagnostic, therapeutic thoracentesis planned 5/11 per PCCM. Would get CT chest to evaluate parenchyma following adequate reexpansion.   Emphysema, longtime tobacco use, suspect COPD:  - Continue bronchodilators - Pulmonary follow up recommended  Obesity: Estimated body mass index is 36.97 kg/m as calculated from the following:   Height as of this encounter: 5\' 8"  (1.727 m).   Weight as of this encounter: 110.3 kg.  BPH:  - Continue terazosin.  Telemetry data reviewed this morning: Showing NSR without significant dysrhythmia. Can DC telemetry monitoring to facilitate mobility.   DVT prophylaxis: Lovenox Code Status: Partial Family Communication: Wife at bedside Disposition Plan:  Status is: Inpatient  Remains inpatient appropriate because:Ongoing diagnostic testing needed not appropriate for outpatient work up   Dispo: The patient is from: Home              Anticipated d/c is to: Home              Patient currently is not medically stable to d/c.   Difficult to place patient No  Consultants:   PCCM  Procedures:   Thoracentesis 01/14/2021  Antimicrobials:  None   Subjective: Shortness of breath is stable, worse than baseline but better with  supplemental oxygen. Able to exert himself with moderate dyspnea improves with rest. No chest pain.   Objective: Vitals:   01/14/21 1358 01/14/21 1410 01/14/21 1415 01/14/21 1420  BP: 133/76 (!) 163/72 (!) 159/63 131/66  Pulse:   85 74  Resp:  (!) 23 (!) 23 (!) 21  Temp:      TempSrc:      SpO2:  94% 91% 93%  Weight:      Height:       No intake or output data in the 24 hours ending 01/14/21 1429 Filed Weights   01/13/21 1131  Weight: 110.3 kg    Gen: 70 y.o. male in no distress Pulm: Non-labored breathing supplemental oxygen, nearly absent breath sounds on right, diminished on left without wheezing.  CV: Regular rate and rhythm. No murmur, rub, or gallop. No JVD, no pedal edema. GI: Abdomen soft, non-tender, non-distended, with normoactive bowel sounds. No organomegaly or masses felt. Ext: Warm, no deformities Skin: No rashes, lesions or ulcers Neuro: Alert and oriented. No focal neurological deficits. Psych: Judgement and insight appear normal. Mood & affect appropriate.   Data Reviewed: I have personally reviewed following labs and imaging studies  CBC: Recent Labs  Lab 01/13/21 1130  WBC 7.1  NEUTROABS 5.1  HGB 13.1  HCT 39.8  MCV 93.6  PLT 891   Basic Metabolic Panel: Recent Labs  Lab 01/13/21 1130  NA 137  K 3.9  CL 104  CO2 26  GLUCOSE 110*  BUN 11  CREATININE 0.93  CALCIUM 9.1   GFR: Estimated Creatinine Clearance: 89.1 mL/min (by C-G formula based  on SCr of 0.93 mg/dL). Liver Function Tests: Recent Labs  Lab 01/13/21 1130  AST 15  ALT 15  ALKPHOS 94  BILITOT 0.6  PROT 6.8  ALBUMIN 3.4*   Recent Labs  Lab 01/13/21 1130  LIPASE 25   No results for input(s): AMMONIA in the last 168 hours. Coagulation Profile: No results for input(s): INR, PROTIME in the last 168 hours. Cardiac Enzymes: No results for input(s): CKTOTAL, CKMB, CKMBINDEX, TROPONINI in the last 168 hours. BNP (last 3 results) No results for input(s): PROBNP in the last  8760 hours. HbA1C: No results for input(s): HGBA1C in the last 72 hours. CBG: No results for input(s): GLUCAP in the last 168 hours. Lipid Profile: No results for input(s): CHOL, HDL, LDLCALC, TRIG, CHOLHDL, LDLDIRECT in the last 72 hours. Thyroid Function Tests: No results for input(s): TSH, T4TOTAL, FREET4, T3FREE, THYROIDAB in the last 72 hours. Anemia Panel: No results for input(s): VITAMINB12, FOLATE, FERRITIN, TIBC, IRON, RETICCTPCT in the last 72 hours. Urine analysis: No results found for: COLORURINE, APPEARANCEUR, LABSPEC, PHURINE, GLUCOSEU, HGBUR, BILIRUBINUR, KETONESUR, PROTEINUR, UROBILINOGEN, NITRITE, LEUKOCYTESUR Recent Results (from the past 240 hour(s))  Resp Panel by RT-PCR (Flu A&B, Covid) Nasopharyngeal Swab     Status: None   Collection Time: 01/13/21 11:29 AM   Specimen: Nasopharyngeal Swab; Nasopharyngeal(NP) swabs in vial transport medium  Result Value Ref Range Status   SARS Coronavirus 2 by RT PCR NEGATIVE NEGATIVE Final    Comment: (NOTE) SARS-CoV-2 target nucleic acids are NOT DETECTED.  The SARS-CoV-2 RNA is generally detectable in upper respiratory specimens during the acute phase of infection. The lowest concentration of SARS-CoV-2 viral copies this assay can detect is 138 copies/mL. A negative result does not preclude SARS-Cov-2 infection and should not be used as the sole basis for treatment or other patient management decisions. A negative result may occur with  improper specimen collection/handling, submission of specimen other than nasopharyngeal swab, presence of viral mutation(s) within the areas targeted by this assay, and inadequate number of viral copies(<138 copies/mL). A negative result must be combined with clinical observations, patient history, and epidemiological information. The expected result is Negative.  Fact Sheet for Patients:  EntrepreneurPulse.com.au  Fact Sheet for Healthcare Providers:   IncredibleEmployment.be  This test is no t yet approved or cleared by the [place] FDA and  has been authorized for detection and/or diagnosis of SARS-CoV-2 by FDA under an Emergency Use Authorization (EUA). This EUA will remain  in effect (meaning this test can be used) for the duration of the COVID-19 declaration under Section 564(b)(1) of the Act, 21 U.S.C.section 360bbb-3(b)(1), unless the authorization is terminated  or revoked sooner.       Influenza A by PCR NEGATIVE NEGATIVE Final   Influenza B by PCR NEGATIVE NEGATIVE Final    Comment: (NOTE) The Xpert Xpress SARS-CoV-2/FLU/RSV plus assay is intended as an aid in the diagnosis of influenza from Nasopharyngeal swab specimens and should not be used as a sole basis for treatment. Nasal washings and aspirates are unacceptable for Xpert Xpress SARS-CoV-2/FLU/RSV testing.  Fact Sheet for Patients: EntrepreneurPulse.com.au  Fact Sheet for Healthcare Providers: IncredibleEmployment.be  This test is not yet approved or cleared by the [place] FDA and has been authorized for detection and/or diagnosis of SARS-CoV-2 by FDA under an Emergency Use Authorization (EUA). This EUA will remain in effect (meaning this test can be used) for the duration of the COVID-19 declaration under Section 564(b)(1) of the Act, 21 U.S.C. section 360bbb-3(b)(1), unless  the authorization is terminated or revoked.  Performed at [facility], [place] [address], [place], [place] [address]   Culture, blood (single)     Status: None (Preliminary result)   Collection Time: 01/13/21 11:36 AM   Specimen: BLOOD  Result Value Ref Range Status   Specimen Description BLOOD SITE NOT SPECIFIED  Final   Special Requests   Final    BOTTLES DRAWN AEROBIC AND ANAEROBIC Blood Culture adequate volume   Culture   Final    NO GROWTH < 24 HOURS Performed at [facility], [place] [address]  [place], [place] [address]    Report Status PENDING  Incomplete      Radiology Studies: CT Angio Chest PE W and/or Wo Contrast  Result Date: 01/13/2021 CLINICAL DATA:  Pleural effusion shortness of breath EXAM: CT ANGIOGRAPHY CHEST WITH CONTRAST TECHNIQUE: Multidetector CT imaging of the chest was performed using the standard protocol during bolus administration of intravenous contrast. Multiplanar CT image reconstructions and MIPs were obtained to evaluate the vascular anatomy. CONTRAST:  59mL OMNIPAQUE IOHEXOL 350 MG/ML SOLN COMPARISON:  Chest x-ray 01/13/2021 FINDINGS: Cardiovascular: Satisfactory opacification of the pulmonary arteries to the segmental level. No evidence of pulmonary embolism. Mild aortic atherosclerosis. No aneurysm. No dissection is seen. Coronary vascular calcification. Normal cardiac size. Trace pericardial effusion Mediastinum/Nodes: Midline trachea. No thyroid mass. No suspicious adenopathy. Esophagus within normal limits. Mediastinal shift to the left. Lungs/Pleura: Large right-sided pleural effusion with compressive atelectasis of the right lung. Underlying pulmonary emphysema with bulla and blebs. Narrowed appearance of the right and left main bronchi with possible fluid or debris in the right bronchus. Upper Abdomen: No acute abnormality. Cyst near the falciform ligament Musculoskeletal: No chest wall abnormality. No acute or significant osseous findings. Review of the MIP images confirms the above findings. IMPRESSION: 1. Negative for acute pulmonary embolus. 2. Large right-sided pleural effusion with compressive atelectasis of the right lung and mediastinal shift of contents to the left. Suspected fluid and or debris within the right bronchus with narrowed appearance of the right greater than left bronchi. 3. Pulmonary emphysema Aortic Atherosclerosis (ICD10-I70.0) and Emphysema (ICD10-J43.9). Electronically Signed   By: Donavan Foil M.D.   On: 01/13/2021 21:23   DG Chest  Portable 1 View  Result Date: 01/13/2021 CLINICAL DATA:  Shortness of breath started 3 weeks ago. EXAM: PORTABLE CHEST 1 VIEW COMPARISON:  CT of the chest on 10/12/2019 FINDINGS: There is near complete opacification of RIGHT hemithorax with small amount of aerated lung in the perihilar region. The significant opacity in the RIGHT hemithorax obscures the RIGHT heart margin. Heart is probably normal in size.  LEFT lung is clear. IMPRESSION: Near complete opacification of the RIGHT hemithorax. Recommend further evaluation with CT of the chest. Electronically Signed   By: Nolon Nations M.D.   On: 01/13/2021 11:59    Scheduled Meds: . [MAR Hold] atorvastatin  80 mg Oral Daily  . [MAR Hold] benzonatate  100 mg Oral BID  . [MAR Hold] enoxaparin (LOVENOX) injection  40 mg Subcutaneous Q24H  . [MAR Hold] pantoprazole  40 mg Oral Daily  . pneumococcal 23 valent vaccine  0.5 mL Intramuscular Tomorrow-1000  . [MAR Hold] terazosin  10 mg Oral Daily  . [MAR Hold] umeclidinium-vilanterol  1 puff Inhalation Daily   Continuous Infusions:   LOS: 1 day   Time spent: 25 minutes.  Patrecia Pour, MD [facility] [email or web address] 01/14/2021, 2:29 PM

## 2021-01-14 NOTE — Procedures (Signed)
Thoracentesis  Procedure Note  Guy Evans  [id number]  Oct 13, 1950  Date:01/14/21  Time:2:45 PM   Provider Performing:Roshawn Ayala W [name]   Procedure: Thoracentesis with imaging guidance (71165)  Indication(s) Pleural Effusion  Consent Risks of the procedure as well as the alternatives and risks of each were explained to the patient and/or caregiver.  Consent for the procedure was obtained and is signed in the bedside chart  Anesthesia Topical only with 1% lidocaine    Time Out Verified patient identification, verified procedure, site/side was marked, verified correct patient position, special equipment/implants available, medications/allergies/relevant history reviewed, required imaging and test results available.   Sterile Technique Maximal sterile technique including full sterile barrier drape, hand hygiene, sterile gown, sterile gloves, mask, hair covering, sterile ultrasound probe cover (if used).  Procedure Description Ultrasound was used to identify appropriate pleural anatomy for placement and overlying skin marked.  Area of drainage cleaned and draped in sterile fashion. Lidocaine was used to anesthetize the skin and subcutaneous tissue.  2000 cc's of amber appearing fluid was drained from the right pleural space. Catheter then removed and bandaid applied to site.   Complications/Tolerance None; patient tolerated the procedure well. Chest X-ray is ordered to confirm no post-procedural complication.   EBL Minimal   Specimen(s) Pleural fluid 3 vials and 1500cc bag sent to lab       Georgann Housekeeper, AGACNP-BC [place] for personal pager PCCM on call pager [phone] until 7pm. Please call Elink 7p-7a. [phone]  01/14/2021 2:45 PM

## 2021-01-15 ENCOUNTER — Encounter (HOSPITAL_COMMUNITY): Payer: Self-pay | Admitting: Emergency Medicine

## 2021-01-15 DIAGNOSIS — J9601 Acute respiratory failure with hypoxia: Secondary | ICD-10-CM | POA: Diagnosis not present

## 2021-01-15 DIAGNOSIS — Z23 Encounter for immunization: Secondary | ICD-10-CM | POA: Diagnosis not present

## 2021-01-15 DIAGNOSIS — J90 Pleural effusion, not elsewhere classified: Secondary | ICD-10-CM | POA: Diagnosis not present

## 2021-01-15 DIAGNOSIS — R0602 Shortness of breath: Secondary | ICD-10-CM | POA: Diagnosis not present

## 2021-01-15 DIAGNOSIS — R918 Other nonspecific abnormal finding of lung field: Secondary | ICD-10-CM | POA: Diagnosis not present

## 2021-01-15 LAB — TRIGLYCERIDES, BODY FLUIDS: Triglycerides, Fluid: 18 mg/dL

## 2021-01-15 MED ORDER — PNEUMOCOCCAL VAC POLYVALENT 25 MCG/0.5ML IJ INJ
0.5000 mL | INJECTION | INTRAMUSCULAR | Status: AC
  Administered 2021-01-15: 0.5 mL via INTRAMUSCULAR
  Filled 2021-01-15: qty 0.5

## 2021-01-15 NOTE — Progress Notes (Signed)
SATURATION QUALIFICATIONS: (This note is used to comply with regulatory documentation for home oxygen)  Patient Saturations on Room Air at Rest = 90%  Patient Saturations on Room Air while Ambulating = 86%  Patient Saturations on 2 Liters of oxygen while Ambulating = 94%  Please briefly explain why patient needs home oxygen: Acute on chronic lung disease & physical deconditioning resulting in oxygen dependency with activities and ambulation.

## 2021-01-15 NOTE — Discharge Summary (Signed)
Physician Discharge Summary  Guy Evans ERD:[id number] DOB: 1951-03-18 DOA: 01/13/2021  PCP: Carlena Hurl, PA-C  Admit date: 01/13/2021 Discharge date: 01/15/2021  Admitted From: Home Disposition: Home   Recommendations for Outpatient Follow-up:  1. Follow up with pulmonary clinic in the next week. May need repeat thoracentesis/pleurx if effusion reaccumulates. [place] to reach out to patient to schedule follow up next week with Dr. Julien Nordmann. Care discussed with Dr. Burr Medico prior to discharge. 3. Follow up cytology (pending at time of discharge)  Home Health: None Equipment/Devices: 2L O2 Discharge Condition: STable CODE STATUS: DNI Diet recommendation: Heart healthy  Brief/Interim Summary: Guy Evans is a 70 y.o. male with a history of tobacco use and recently diagnosed COPD who presented to his PCP with continued cough and dyspnea worsening over several weeks despite initiation of bronchodilators, found to be hypoxic and referred to the ED where CXR revealed large right-sided pleural effusion confirmed on subsequent CTA chest. PCCM was consulted and performed thoracentesis on 5/11. The patient was symptomatically improved after this and requested discharge prior to full results returning.   Discharge Diagnoses:  Active Problems:   Hypoxia  Painless right-sided pleural effusion with right-sided irregular pulmonary nodule, pleural thickening: Suspected to be malignant.  - 2L out during thoracentesis 5/11 with exudative labs, mitotic figures on cell count, cytology pending. PT breathing comfortably, wants to go home instead of waiting for cytology results. Would rather follow up with oncology as outpatient. Dr. Burr Medico (oncology on call) contacted and will arrange follow up with cancer center next week by which time cytology data should have returned.  Emphysema, longtime tobacco use, suspect COPD:  - Continue bronchodilators - Follow up with pulmonary to be  arranged.  Obesity: Estimated body mass index is 36.97 kg/m   BPH:  - Continue terazosin.  Discharge Instructions Discharge Instructions    Diet general   Complete by: As directed    Discharge instructions   Complete by: As directed    You were admitted with a pleural effusion (fluid around the right lung) that was large and was treated with thoracentesis. There remains some fluid, and it is not clear if this will reaccumulate. If your symptoms return/worsen, seek medical attention right away.   The studies on the fluid collected are still pending. The CT scan does show evidence concerning for cancer in the lung which may be causing this fluid accumulation. The fluid studies should be back in the next couple days. You should be contacted by [facility] office (you saw Dr. Lamonte Sakai in the hospital) for follow up as well as the [facility] (I spoke with Dr. Truitt Merle who recommended follow up with the pulmonary cancer specialist Dr. Julien Nordmann) for follow up there. If you are not contacted, please call the numbers provided.     Allergies as of 01/15/2021   No Known Allergies     Medication List    TAKE these medications   albuterol 108 (90 Base) MCG/ACT inhaler Commonly known as: VENTOLIN HFA Inhale 2 puffs into the lungs every 6 (six) hours as needed for wheezing or shortness of breath.   atorvastatin 80 MG tablet Commonly known as: LIPITOR Take 1 tablet (80 mg total) by mouth daily. Please make overdue appt with Dr. Radford Pax before anymore refills. Thank you 1st attempt   glucosamine-chondroitin 500-400 MG tablet Take 1 tablet by mouth daily.   omeprazole 20 MG capsule Commonly known as: PRILOSEC Take by mouth.   QC TUMERIC  COMPLEX PO Take 1,000 mg by mouth daily.   terazosin 10 MG capsule Commonly known as: HYTRIN TAKE 1 CAPSULE BY MOUTH EVERY DAY   Trelegy Ellipta 100-62.5-25 MCG/INH Aepb Generic drug: Fluticasone-Umeclidin-Vilant Inhale 1 puff into the  lungs daily.            Durable Medical Equipment  (From admission, onward)         Start     Ordered   01/15/21 0934  For home use only DME oxygen  Once       Question Answer Comment  Length of Need 6 Months   Mode or (Route) Nasal cannula   Liters per Minute 2   Frequency Continuous (stationary and portable oxygen unit needed)   Oxygen delivery system Gas      01/15/21 0933          Follow-up Information    Tysinger, Camelia Eng, PA-C Follow up.   Specialty: Family Medicine Contact information: [place] [address] [phone]        Collene Gobble, MD. Schedule an appointment as soon as possible for a visit in 1 week(s).   Specialty: Pulmonary Disease Why: call if you are not contacted in the next couple days Contact information: [address] [place] [address] [phone]        Curt Bears, MD. Schedule an appointment as soon as possible for a visit.   Specialty: Oncology Why: Please call the [phone] number if you are not contacted to schedule an appointment. Contact information: [place] [address] [phone]              No Known Allergies  Consultations:  Oncology  PCCM  Procedures/Studies: CT CHEST WO CONTRAST  Result Date: 01/14/2021 CLINICAL DATA:  Pleural effusion, status post thoracentesis, persistent cough EXAM: CT CHEST WITHOUT CONTRAST TECHNIQUE: Multidetector CT imaging of the chest was performed following the standard protocol without IV contrast. COMPARISON:  CT chest angiogram, 01/13/2021 FINDINGS: Cardiovascular: Aortic atherosclerosis. Normal heart size. Three-vessel coronary artery calcifications. No pericardial effusion. Mediastinum/Nodes: No enlarged mediastinal, hilar, or axillary lymph nodes. Small hiatal hernia. Thyroid gland, trachea, and esophagus demonstrate no significant findings. Lungs/Pleura: Moderate right pleural effusion, diminished in  volume compared to prior examination. There is paraseptal emphysema with sizable bulla. There is a lobulated nodule of the aerated portion of the superior segment right lower lobe measuring 1.4 x 0.9 cm (series 5, image 66). There appears to be some right-sided pleural thickening, particularly in the right aspect of the costophrenic recess (series 3, image 119). Upper Abdomen: No acute abnormality. Musculoskeletal: No chest wall mass or suspicious bone lesions identified. IMPRESSION: 1. Moderate right pleural effusion, diminished in volume compared to prior examination, in keeping with recent thoracentesis. 2. There is a lobulated nodule of the aerated portion of the superior segment right lower lobe measuring 1.4 x 0.9 cm. There appears to be some right-sided pleural thickening, particularly in the right aspect of the costophrenic recess. Findings are concerning for malignancy and associated malignant effusion. Correlate with thoracentesis cytopathology and consider PET-CT for metabolic characterization. 3. Paraseptal emphysema. 4. Coronary artery disease. Aortic Atherosclerosis (ICD10-I70.0) and Emphysema (ICD10-J43.9). Electronically Signed   By: Eddie Candle M.D.   On: 01/14/2021 16:48   CT Angio Chest PE W and/or Wo Contrast  Result Date: 01/13/2021 CLINICAL DATA:  Pleural effusion shortness of breath EXAM: CT ANGIOGRAPHY CHEST WITH CONTRAST TECHNIQUE: Multidetector CT imaging of the chest was performed  using the standard protocol during bolus administration of intravenous contrast. Multiplanar CT image reconstructions and MIPs were obtained to evaluate the vascular anatomy. CONTRAST:  61mL OMNIPAQUE IOHEXOL 350 MG/ML SOLN COMPARISON:  Chest x-ray 01/13/2021 FINDINGS: Cardiovascular: Satisfactory opacification of the pulmonary arteries to the segmental level. No evidence of pulmonary embolism. Mild aortic atherosclerosis. No aneurysm. No dissection is seen. Coronary vascular calcification. Normal cardiac  size. Trace pericardial effusion Mediastinum/Nodes: Midline trachea. No thyroid mass. No suspicious adenopathy. Esophagus within normal limits. Mediastinal shift to the left. Lungs/Pleura: Large right-sided pleural effusion with compressive atelectasis of the right lung. Underlying pulmonary emphysema with bulla and blebs. Narrowed appearance of the right and left main bronchi with possible fluid or debris in the right bronchus. Upper Abdomen: No acute abnormality. Cyst near the falciform ligament Musculoskeletal: No chest wall abnormality. No acute or significant osseous findings. Review of the MIP images confirms the above findings. IMPRESSION: 1. Negative for acute pulmonary embolus. 2. Large right-sided pleural effusion with compressive atelectasis of the right lung and mediastinal shift of contents to the left. Suspected fluid and or debris within the right bronchus with narrowed appearance of the right greater than left bronchi. 3. Pulmonary emphysema Aortic Atherosclerosis (ICD10-I70.0) and Emphysema (ICD10-J43.9). Electronically Signed   By: Donavan Foil M.D.   On: 01/13/2021 21:23   DG Chest Port 1 View  Result Date: 01/14/2021 CLINICAL DATA:  Status post right-sided thoracentesis EXAM: PORTABLE CHEST 1 VIEW COMPARISON:  Jan 13, 2021 FINDINGS: No pneumothorax. Right pleural effusion smaller after thoracentesis. There is a persistent moderate right pleural effusion with areas of atelectasis and potential consolidation in the right middle and lower lobes. The left lung is clear. Heart size and pulmonary vascularity are normal. No adenopathy. There is aortic atherosclerosis. There is degenerative change in each shoulder. IMPRESSION: No pneumothorax. Right pleural effusion smaller after thoracentesis on the right. There is residual moderate right pleural effusion with areas of atelectasis and potential consolidation in portions of the right middle and lower lobes. Left lung clear. Stable cardiac  silhouette. Aortic Atherosclerosis (ICD10-I70.0). Electronically Signed   By: Lowella Grip III M.D.   On: 01/14/2021 15:18   DG Chest Portable 1 View  Result Date: 01/13/2021 CLINICAL DATA:  Shortness of breath started 3 weeks ago. EXAM: PORTABLE CHEST 1 VIEW COMPARISON:  CT of the chest on 10/12/2019 FINDINGS: There is near complete opacification of RIGHT hemithorax with small amount of aerated lung in the perihilar region. The significant opacity in the RIGHT hemithorax obscures the RIGHT heart margin. Heart is probably normal in size.  LEFT lung is clear. IMPRESSION: Near complete opacification of the RIGHT hemithorax. Recommend further evaluation with CT of the chest. Electronically Signed   By: Nolon Nations M.D.   On: 01/13/2021 11:59    Thoracentesis  Subjective: Wants to go home. Breathing much better than before. No chest pain, bleeding, or other complaints.   Discharge Exam: Vitals:   01/15/21 0715 01/15/21 0814  BP:    Pulse:  68  Resp:  18  Temp:    SpO2: 92%    General: Pt is alert, awake, not in acute distress Cardiovascular: RRR, S1/S2 +, no rubs, no gallops Respiratory: Normal effort, improved aeration, no wheezes or crackles. Abdominal: Soft, NT, ND, bowel sounds + Extremities: No edema, no cyanosis  Labs: BNP (last 3 results) Recent Labs    01/13/21 1135  BNP 65.6   Basic Metabolic Panel: Recent Labs  Lab 01/13/21 1130  NA 137  K 3.9  CL 104  CO2 26  GLUCOSE 110*  BUN 11  CREATININE 0.93  CALCIUM 9.1   Liver Function Tests: Recent Labs  Lab 01/13/21 1130 01/14/21 1554  AST 15  --   ALT 15  --   ALKPHOS 94  --   BILITOT 0.6  --   PROT 6.8 6.4*  ALBUMIN 3.4*  --    Recent Labs  Lab 01/13/21 1130  LIPASE 25   No results for input(s): AMMONIA in the last 168 hours. CBC: Recent Labs  Lab 01/13/21 1130  WBC 7.1  NEUTROABS 5.1  HGB 13.1  HCT 39.8  MCV 93.6  PLT 268   Cardiac Enzymes: No results for input(s): CKTOTAL,  CKMB, CKMBINDEX, TROPONINI in the last 168 hours. BNP: Invalid input(s): POCBNP CBG: No results for input(s): GLUCAP in the last 168 hours. D-Dimer No results for input(s): DDIMER in the last 72 hours. Hgb A1c No results for input(s): HGBA1C in the last 72 hours. Lipid Profile No results for input(s): CHOL, HDL, LDLCALC, TRIG, CHOLHDL, LDLDIRECT in the last 72 hours. Thyroid function studies No results for input(s): TSH, T4TOTAL, T3FREE, THYROIDAB in the last 72 hours.  Invalid input(s): FREET3 Anemia work up No results for input(s): VITAMINB12, FOLATE, FERRITIN, TIBC, IRON, RETICCTPCT in the last 72 hours. Urinalysis No results found for: COLORURINE, APPEARANCEUR, LABSPEC, [place], GLUCOSEU, [place], [place], [place], PROTEINUR, UROBILINOGEN, NITRITE, LEUKOCYTESUR  Microbiology Recent Results (from the past 240 hour(s))  Resp Panel by RT-PCR (Flu A&B, Covid) Nasopharyngeal Swab     Status: None   Collection Time: 01/13/21 11:29 AM   Specimen: Nasopharyngeal Swab; Nasopharyngeal(NP) swabs in vial transport medium  Result Value Ref Range Status   SARS Coronavirus 2 by RT PCR NEGATIVE NEGATIVE Final    Comment: (NOTE) SARS-CoV-2 target nucleic acids are NOT DETECTED.  The SARS-CoV-2 RNA is generally detectable in upper respiratory specimens during the acute phase of infection. The lowest concentration of SARS-CoV-2 viral copies this assay can detect is 138 copies/mL. A negative result does not preclude SARS-Cov-2 infection and should not be used as the sole basis for treatment or other patient management decisions. A negative result may occur with  improper specimen collection/handling, submission of specimen other than nasopharyngeal swab, presence of viral mutation(s) within the areas targeted by this assay, and inadequate number of viral copies(<138 copies/mL). A negative result must be combined with clinical observations, patient history, and  epidemiological information. The expected result is Negative.  Fact Sheet for Patients:  EntrepreneurPulse.com.au  Fact Sheet for Healthcare Providers:  IncredibleEmployment.be  This test is no t yet approved or cleared by the [place] FDA and  has been authorized for detection and/or diagnosis of SARS-CoV-2 by FDA under an Emergency Use Authorization (EUA). This EUA will remain  in effect (meaning this test can be used) for the duration of the COVID-19 declaration under Section 564(b)(1) of the Act, 21 U.S.C.section 360bbb-3(b)(1), unless the authorization is terminated  or revoked sooner.       Influenza A by PCR NEGATIVE NEGATIVE Final   Influenza B by PCR NEGATIVE NEGATIVE Final    Comment: (NOTE) The Xpert Xpress SARS-CoV-2/FLU/RSV plus assay is intended as an aid in the diagnosis of influenza from Nasopharyngeal swab specimens and should not be used as a sole basis for treatment. Nasal washings and aspirates are unacceptable for Xpert Xpress SARS-CoV-2/FLU/RSV testing.  Fact Sheet for Patients: EntrepreneurPulse.com.au  Fact Sheet for Healthcare Providers: IncredibleEmployment.be  This test is not yet  approved or cleared by the [place] and has been authorized for detection and/or diagnosis of SARS-CoV-2 by FDA under an Emergency Use Authorization (EUA). This EUA will remain in effect (meaning this test can be used) for the duration of the COVID-19 declaration under Section 564(b)(1) of the Act, 21 U.S.C. section 360bbb-3(b)(1), unless the authorization is terminated or revoked.  Performed at [facility], [place] [address], [place], [place] [address]   Culture, blood (single)     Status: None (Preliminary result)   Collection Time: 01/13/21 11:36 AM   Specimen: BLOOD  Result Value Ref Range Status   Specimen Description BLOOD SITE NOT SPECIFIED  Final   Special Requests    Final    BOTTLES DRAWN AEROBIC AND ANAEROBIC Blood Culture adequate volume   Culture   Final    NO GROWTH 2 DAYS Performed at [facility], [address] [place], [place] [address]    Report Status PENDING  Incomplete    Time coordinating discharge: Approximately 40 minutes  Patrecia Pour, MD  [facility] 01/15/2021, 9:53 AM

## 2021-01-15 NOTE — Progress Notes (Signed)
NAME:  Guy Evans, MRN:  [id number], DOB:  1951-04-10, LOS: 2 ADMISSION DATE:  01/13/2021, CONSULTATION DATE:  01/13/21 REFERRING MD:  EDP, CHIEF COMPLAINT:  Pleural effusion    History of Present Illness:  70yo male former smoker (quit 1993) with hx HTN, ?COPD presents with 3 week hx SOB.  He was seen by PCP 1 week ago and given new inhaler.  Seen again on date of admission and was sent to ER given obvious dyspnea and mild hypoxia in office.  In ER sats 85% on RA, CXR revealed large R pleural effusion with near complete opacification R hemithorax and PCCM consulted for further eval and possible thoracentesis.   Pertinent  Medical History   has a past medical history of Enlarged prostate, Gastroparesis, GERD (gastroesophageal reflux disease), H/O cardiovascular stress test (10/2019), History of colon polyps, History of gastric ulcer, Joint pain, and Urinary frequency.   Significant Hospital Events: Including procedures, antibiotic start and stop dates in addition to other pertinent events   . Spirometry 01/06/21 - FEV1 38% pred, FVC 1.5L . Cardiac CT 10/11/20 c/w paraseptal/ bullous emphysema, also showing a subpleural, partially calcified paramediastinal nodule within the right lung measures 0.9 cm . CT-PA 5/10, no pulmonary embolism, no adenopathy, large right pleural effusion with large-scale right lung atelectasis.  Emphysematous change on the left.  Question debris of the right mainstem bronchus . Right thoracentesis 5/11, 2000 cc removed >> exudate, mitotic figures on cell count, cytology pending . Repeat CT chest 5/11 show decreased pleural effusion, paraseptal emphysema with some bullous changes, a lobulated right lower lobe superior segmental nodule, some possible right-sided pleural thickening and studding without overt mass  Interim History / Subjective:   Tolerated thoracentesis yesterday States that his breathing has improved with fluid removal Remains on supplemental  oxygen  Objective   Blood pressure (!) 110/54, pulse 68, temperature 98.6 F (37 C), temperature source Oral, resp. rate 18, height 5\' 8"  (1.727 m), weight 110.3 kg, SpO2 90 %.    FiO2 (%):  [28 %] 28 %   Intake/Output Summary (Last 24 hours) at 01/15/2021 0859 Last data filed at 01/14/2021 1445 Gross per 24 hour  Intake --  Output 2000 ml  Net -2000 ml   Filed Weights   01/13/21 1131  Weight: 110.3 kg    Examination: General: Obese gentleman, laying in bed in no distress HENT: Oropharynx clear, no stridor, no secretions Lungs: Improved breath sounds on the right but decreased at the base, left is clear Cardiovascular: Regular, no murmur Abdomen: Obese, nondistended, bowel sounds present Extremities: No edema Neuro: Awake, alert, appropriate, nonfocal  Labs/imaging that I havepersonally reviewed  (right click and "Reselect all SmartList Selections" daily)   CT chest as above Pleural fluid studies reviewed, consistent with an exudate, mitotic figures seen on the cell count, cytology pending  Resolved Hospital Problem list     Assessment & Plan:  Large Right exudative pleural Effusion.  Mitotic figures seen on the cell count, cytology pending but consistent with malignancy.  This would correlate with the superior segmental irregular nodule, apparent studding of the right pleural surface. 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  He will need oncology evaluation ASAP.  Discussed the likelihood of this diagnosis with him at bedside today.  Given the mitotic figures on his pleural fluid I do  not think we need to repeat his thoracentesis, just wait for the existing cytology.  We should try to rush this if possible.  He can either remain hospitalized for the result to come back or go home and follow soon as an outpatient with  oncology, pulmonology.  He may decide to go home as he has responsibilities for giving care to his wife.  Depending on the rate of reaccumulation of his right effusion he may require other intervention such as Pleurx catheter going forward.  We can talk about this depending on the interval change. -Wean his oxygen as able, question whether he may need supplemental oxygen with ambulation for home. -He was tried on Trelegy as an outpatient.  He can continue this or we change him over to [place] if he feels that he is benefited.  Not clear to me that he needs the ICS component of Trelegy   Labs   CBC: Recent Labs  Lab 01/13/21 1130  WBC 7.1  NEUTROABS 5.1  HGB 13.1  HCT 39.8  MCV 93.6  PLT 798    Basic Metabolic Panel: Recent Labs  Lab 01/13/21 1130  NA 137  K 3.9  CL 104  CO2 26  GLUCOSE 110*  BUN 11  CREATININE 0.93  CALCIUM 9.1   GFR: Estimated Creatinine Clearance: 89.1 mL/min (by C-G formula based on SCr of 0.93 mg/dL). Recent Labs  Lab 01/13/21 1130 01/13/21 1136  WBC 7.1  --   LATICACIDVEN  --  0.9    Baltazar Apo, MD, PhD 01/15/2021, 8:59 AM [facility] [phone] or if no answer before 7:00PM call [phone] For any issues after 7:00PM please call eLink [phone]

## 2021-01-15 NOTE — Plan of Care (Signed)
Discharge instruction given to the patient and he stated understanding. Follow up appointment and med to contd explained and he stated understanding. Questions and concerns answered. IV discontinued. Patient discharged via wheel chair along with home oxygen. Patient hemodynamically stable during discharge.  Problem: Education: Goal: Knowledge of General Education information will improve Description: Including pain rating scale, medication(s)/side effects and non-pharmacologic comfort measures Outcome: Adequate for Discharge   Problem: Health Behavior/Discharge Planning: Goal: Ability to manage health-related needs will improve Outcome: Adequate for Discharge   Problem: Clinical Measurements: Goal: Ability to maintain clinical measurements within normal limits will improve Outcome: Adequate for Discharge Goal: Will remain free from infection Outcome: Adequate for Discharge Goal: Diagnostic test results will improve Outcome: Adequate for Discharge Goal: Respiratory complications will improve Outcome: Adequate for Discharge Goal: Cardiovascular complication will be avoided Outcome: Adequate for Discharge   Problem: Activity: Goal: Risk for activity intolerance will decrease Outcome: Adequate for Discharge   Problem: Nutrition: Goal: Adequate nutrition will be maintained Outcome: Adequate for Discharge   Problem: Coping: Goal: Level of anxiety will decrease Outcome: Adequate for Discharge   Problem: Elimination: Goal: Will not experience complications related to bowel motility Outcome: Adequate for Discharge Goal: Will not experience complications related to urinary retention Outcome: Adequate for Discharge   Problem: Pain Managment: Goal: General experience of comfort will improve Outcome: Adequate for Discharge   Problem: Safety: Goal: Ability to remain free from injury will improve Outcome: Adequate for Discharge   Problem: Skin Integrity: Goal: Risk for impaired skin  integrity will decrease Outcome: Adequate for Discharge

## 2021-01-15 NOTE — TOC Initial Note (Signed)
Transition of Care [facility]) - Initial/Assessment Note    Patient Details  Name: Guy Evans MRN: [id number] Date of Birth: 07-05-1951  Transition of Care [facility]) CM/SW Contact:    Joanne Chars, LCSW Phone Number: 01/15/2021, 10:06 AM  Clinical Narrative:  CSW spoke with pt by phone to complete assessment.  Pt will be new home O2 patient, agreeable to referral to [employer].  Son is in town to assist currently.  Permission given to speak with wife.  Home O2 order in but no saturation note.  CSW spoke with RN who will complete this.                  Expected Discharge Plan: Home/Self Care Barriers to Discharge: No Barriers Identified   Patient Goals and CMS Choice   CMS Medicare.gov Compare Post Acute Care list provided to::  (na)    Expected Discharge Plan and Services Expected Discharge Plan: Home/Self Care In-house Referral: Clinical Social Work   Post Acute Care Choice: Durable Medical Equipment Living arrangements for the past 2 months: Single Family Home Expected Discharge Date: 01/15/21               DME Arranged: Oxygen DME Agency: [employer] Date DME Agency Contacted: 01/15/21 Time DME Agency Contacted: 69 Representative spoke with at DME Agency: [place]: NA          Prior Living Arrangements/Services Living arrangements for the past 2 months: [place] Lives with:: Spouse Patient language and need for interpreter reviewed:: Yes Do you feel safe going back to the place where you live?: Yes      Need for Family Participation in Patient Care: No (Comment) Care giver support system in place?: Yes (comment) Current home services: Other (comment) (none) Criminal Activity/Legal Involvement Pertinent to Current Situation/Hospitalization: No - Comment as needed  Activities of Daily Living Home Assistive Devices/Equipment: None ADL Screening (condition at time of admission) Patient's cognitive ability adequate to safely complete daily  activities?: Yes Is the patient deaf or have difficulty hearing?: No Does the patient have difficulty seeing, even when wearing glasses/contacts?: No Does the patient have difficulty concentrating, remembering, or making decisions?: No Patient able to express need for assistance with ADLs?: Yes Does the patient have difficulty dressing or bathing?: No Independently performs ADLs?: Yes (appropriate for developmental age) Does the patient have difficulty walking or climbing stairs?: No Weakness of Legs: None Weakness of Arms/Hands: None  Permission Sought/Granted Permission sought to share information with : Family Supports Permission granted to share information with : Yes, Verbal Permission Granted  Share Information with NAME: wife Pamala Hurry           Emotional Assessment Appearance::  (phone assessment-unable to determine) Attitude/Demeanor/Rapport: Engaged Affect (typically observed): Appropriate,Pleasant Orientation: : Oriented to Self,Oriented to Place,Oriented to  Time,Oriented to Situation Alcohol / Substance Use: Not Applicable Psych Involvement: No (comment)  Admission diagnosis:  Pleural effusion [J90] Hypoxia [R09.02] Acute respiratory failure with hypoxia ([place]) [J96.01] Patient Active Problem List   Diagnosis Date Noted  . Dyspnea 01/13/2021  . Abnormal lung field 01/13/2021  . Hypoxia 01/13/2021  . Acute respiratory failure with hypoxia ([place])   . Pleural effusion   . SOB (shortness of breath) 01/06/2021  . Pulmonary emphysema ([place]) 01/06/2021  . Chronic obstructive pulmonary disease with acute exacerbation ([place]) 01/06/2021  . Screening for diabetes mellitus 11/05/2020  . History of skin cancer 11/05/2020  . Gastroesophageal reflux disease 11/05/2020  . Plantar fasciitis 11/05/2020  . Encounter for  screening for vascular disease 11/05/2020  . BMI 37.0-37.9, adult 10/20/2020  . Advanced directives, counseling/discussion 10/20/2020  . Loss of height 10/20/2020   . Screening for osteoporosis 10/20/2020  . Aortic atherosclerosis ([place]) 10/20/2020  . Abnormal CT lung screening 10/20/2020  . Abnormal CT of the chest 10/20/2020  . Coronary artery disease involving native heart without angina pectoris 10/20/2020  . Need for influenza vaccination 07/19/2018  . Colonoscopy refused 04/27/2018  . Benign prostatic hyperplasia 04/03/2018  . Hyperlipidemia 01/17/2017  . Hemorrhoids 01/17/2017  . Encounter for health maintenance examination in adult 10/18/2016  . Medicare annual wellness visit, subsequent 10/18/2016  . Barrett's esophagus without dysplasia 10/18/2016  . Polyp of colon 10/18/2016  . Vaccine counseling 10/18/2016  . Screen for colon cancer 10/18/2016  . Family history of premature CAD 10/18/2016   PCP:  Carlena Hurl, PA-C Pharmacy:   [id number] - [place], [place] [address] [place] [address] Phone: [phone] Fax: [phone]     Social Determinants of Health (SDOH) Interventions    Readmission Risk Interventions No flowsheet data found.

## 2021-01-15 NOTE — Progress Notes (Signed)
   Pulm office follow up >>  5/18 at [place], MD, PhD 01/15/2021, 11:15 AM [facility] [phone] or if no answer before 7:00PM call [phone] For any issues after 7:00PM please call eLink [phone]

## 2021-01-17 LAB — CHOLESTEROL, BODY FLUID: Cholesterol, Fluid: 55 mg/dL

## 2021-01-18 LAB — CULTURE, BLOOD (SINGLE)
Culture: NO GROWTH
Special Requests: ADEQUATE

## 2021-01-19 ENCOUNTER — Telehealth: Payer: Self-pay | Admitting: *Deleted

## 2021-01-19 LAB — CYTOLOGY - NON PAP

## 2021-01-19 NOTE — Telephone Encounter (Signed)
I received referral on Guy Evans. I called but was unable to reach nor leave vm message.

## 2021-01-21 ENCOUNTER — Ambulatory Visit: Payer: Medicare HMO | Admitting: Emergency Medicine

## 2021-01-21 ENCOUNTER — Ambulatory Visit (INDEPENDENT_AMBULATORY_CARE_PROVIDER_SITE_OTHER): Payer: Medicare HMO

## 2021-01-21 ENCOUNTER — Encounter: Payer: Self-pay | Admitting: Medical

## 2021-01-21 ENCOUNTER — Other Ambulatory Visit: Payer: Self-pay

## 2021-01-21 ENCOUNTER — Ambulatory Visit (INDEPENDENT_AMBULATORY_CARE_PROVIDER_SITE_OTHER): Payer: Medicare HMO | Admitting: Medical

## 2021-01-21 ENCOUNTER — Other Ambulatory Visit: Payer: Self-pay | Admitting: Internal Medicine

## 2021-01-21 ENCOUNTER — Encounter: Payer: Self-pay | Admitting: Emergency Medicine

## 2021-01-21 ENCOUNTER — Telehealth: Payer: Self-pay | Admitting: *Deleted

## 2021-01-21 VITALS — BP 126/70 | HR 71 | Ht 68.0 in | Wt 240.4 lb

## 2021-01-21 VITALS — BP 118/72 | HR 74 | Temp 98.2°F | Ht 69.0 in | Wt 239.8 lb

## 2021-01-21 DIAGNOSIS — Z0000 Encounter for general adult medical examination without abnormal findings: Secondary | ICD-10-CM

## 2021-01-21 DIAGNOSIS — J449 Chronic obstructive pulmonary disease, unspecified: Secondary | ICD-10-CM

## 2021-01-21 DIAGNOSIS — Z87891 Personal history of nicotine dependence: Secondary | ICD-10-CM

## 2021-01-21 DIAGNOSIS — J90 Pleural effusion, not elsewhere classified: Secondary | ICD-10-CM

## 2021-01-21 DIAGNOSIS — J9601 Acute respiratory failure with hypoxia: Secondary | ICD-10-CM

## 2021-01-21 DIAGNOSIS — J439 Emphysema, unspecified: Secondary | ICD-10-CM

## 2021-01-21 DIAGNOSIS — R0902 Hypoxemia: Secondary | ICD-10-CM | POA: Diagnosis not present

## 2021-01-21 NOTE — H&P (View-Only) (Signed)
Subjective:    Patient ID: Guy Evans, male    DOB: August 06, 1951, 70 y.o.   MRN: [id number]  HPI 70 year old gentleman with a history of former tobacco use (40 pack years), hypertension, GERD, newly diagnosed COPD on Trelegy.  I saw him in the hospital last week when he was admitted for dyspnea and a large right pleural effusion.  We performed thoracentesis on 5/11, showed an exudate, cytology showed mitotic figures and atypical cells suggestive of mesothelial origin but not enough to definitively establish malignancy.  His shortness of breath did improve postthoracentesis.  Right lower lobe superior segmental nodule identified as below.  Presents now for further evaluation.  He reports his dyspnea is.  He remains on Trelegy, has albuterol which he uses  CT chest performed 01/14/2021 after thoracentesis reviewed by me, showed decrease in size of his large right pleural effusion, continued pleural fluid and right lower lobe atelectasis, right lower lobe superior segmental nodule 1.4 cm with some associated right-sided pleural thickening.   Review of Systems As per HPI  Past Medical History:  Diagnosis Date  . Enlarged prostate    takes Terazosin daily  . Gastroparesis   . GERD (gastroesophageal reflux disease)    occasionally but no meds required  . H/O cardiovascular stress test 10/2019   nuclear, normal, Dr. Fransico Him  . History of colon polyps   . History of gastric ulcer   . Joint pain    knees and ankles  . Urinary frequency      Family History  Problem Relation Age of Onset  . Dementia Mother   . Heart disease Father 59       MI at 57yo, angina  . Obesity Father   . Cancer Neg Hx   . Stroke Neg Hx   . Diabetes Neg Hx      Social History   Socioeconomic History  . Marital status: Married    Spouse name: Not on file  . Number of children: Not on file  . Years of education: Not on file  . Highest education level: Not on file  Occupational History  . Not on  file  Tobacco Use  . Smoking status: Former Smoker    Packs/day: 2.00    Years: 30.00    Pack years: 60.00    Types: Cigarettes    Quit date: 1992    Years since quitting: 30.3  . Smokeless tobacco: Never Used  . Tobacco comment: quit smoking around 1993  Substance and Sexual Activity  . Alcohol use: Yes    Alcohol/week: 12.0 standard drinks    Types: 6 Glasses of wine, 6 Shots of liquor per week    Comment: none since gallbladder issues  . Drug use: Yes    Types: Marijuana  . Sexual activity: Yes  Other Topics Concern  . Not on file  Social History Narrative   Not exercising.  Married.   Has 2 children, 2 grandchildren, one child [place], [place], one in [place], [place].   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 albuterol (VENTOLIN  HFA) 108 (90 Base) MCG/ACT inhaler Inhale 2 puffs into the lungs every 6 (six) hours as needed for wheezing or shortness of breath. 18 g 0  . atorvastatin (LIPITOR) 80 MG tablet Take 1 tablet (80 mg total) by mouth daily. Please make overdue appt with Dr. Radford Pax before anymore refills. Thank you 1st attempt 30 tablet 0  . Fluticasone-Umeclidin-Vilant (TRELEGY ELLIPTA) 100-62.5-25 MCG/INH AEPB Inhale 1 puff into the lungs daily. 28 each 0  . glucosamine-chondroitin 500-400 MG tablet Take 1 tablet by mouth daily.    Marland Kitchen omeprazole (PRILOSEC) 20 MG capsule Take by mouth.    . terazosin (HYTRIN) 10 MG capsule TAKE 1 CAPSULE BY MOUTH EVERY DAY 90 capsule 0  . Turmeric (QC TUMERIC COMPLEX PO) Take 1,000 mg by mouth daily.     No facility-administered medications prior to visit.         Objective:   Physical Exam  Vitals:   01/21/21 1512  BP: 118/72  Pulse: 74  Temp: 98.2 F (36.8 C)  TempSrc: Temporal  SpO2: 94%  Weight: 239 lb 12.8 oz (108.8 kg)  Height: 5\' 9"  (1.753 m)   Gen: Pleasant, well-nourished, in no distress,  normal affect  ENT: No lesions,  mouth clear,  oropharynx clear, no postnasal drip  Neck: No JVD, no stridor  Lungs: No use of accessory muscles, no crackles or wheezing on normal respiration, no wheeze on forced expiration  Cardiovascular: RRR, heart sounds normal, no murmur or gallops, no peripheral edema  Musculoskeletal: No deformities, no cyanosis or clubbing  Neuro: alert, awake, non focal  Skin: Warm, no lesions or rash     Assessment & Plan:  Pleural effusion Unfortunately consistent with malignancy although we did not establish this on his initial thoracentesis.  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  He does have a peripheral right lower lobe superior segmental nodule.  This would potentially be a target for biopsy although will be difficult or impossible to do so if his right lower lobe is collapsed.  I think the most straightforward approach at this time is to repeat his thoracentesis, see if the cytology is more revealing.  I suspect he will also need a PET scan to look for pleural disease, better evaluate the right lower lobe nodule.  If the pleural fluid does not give [place] an answer then he still may need either a navigational bronchoscopy or TTNA.  It is possible that the PET scan could give [place] alternative target as well.  I will try to get the repeat thoracentesis done on Friday 5/20, rush the cytology results so that they can be available for review when he sees Dr. Julien Nordmann on 5/23.  COPD (chronic obstructive pulmonary disease) ([place]) New diagnosis.  Plan to continue his Trelegy, albuterol as needed.  45 minutes spent coordinating care, arranging for thoracentesis, discussing plans, reviewing images with  the patient  Baltazar Apo, MD, PhD 01/21/2021, 3:42 PM  P[facility] Care [phone] or if no answer before 7:00PM call [phone] For any issues after 7:00PM please call eLink [phone]

## 2021-01-21 NOTE — Progress Notes (Signed)
Subjective:    Patient ID: Guy Evans, male    DOB: 1951-07-03, 70 y.o.   MRN: [id number]  HPI 70 year old gentleman with a history of former tobacco use (40 pack years), hypertension, GERD, newly diagnosed COPD on Trelegy.  I saw him in the hospital last week when he was admitted for dyspnea and a large right pleural effusion.  We performed thoracentesis on 5/11, showed an exudate, cytology showed mitotic figures and atypical cells suggestive of mesothelial origin but not enough to definitively establish malignancy.  His shortness of breath did improve postthoracentesis.  Right lower lobe superior segmental nodule identified as below.  Presents now for further evaluation.  He reports his dyspnea is.  He remains on Trelegy, has albuterol which he uses  CT chest performed 01/14/2021 after thoracentesis reviewed by me, showed decrease in size of his large right pleural effusion, continued pleural fluid and right lower lobe atelectasis, right lower lobe superior segmental nodule 1.4 cm with some associated right-sided pleural thickening.   Review of Systems As per HPI  Past Medical History:  Diagnosis Date  . Enlarged prostate    takes Terazosin daily  . Gastroparesis   . GERD (gastroesophageal reflux disease)    occasionally but no meds required  . H/O cardiovascular stress test 10/2019   nuclear, normal, Dr. Fransico Him  . History of colon polyps   . History of gastric ulcer   . Joint pain    knees and ankles  . Urinary frequency      Family History  Problem Relation Age of Onset  . Dementia Mother   . Heart disease Father 37       MI at 69yo, angina  . Obesity Father   . Cancer Neg Hx   . Stroke Neg Hx   . Diabetes Neg Hx      Social History   Socioeconomic History  . Marital status: Married    Spouse name: Not on file  . Number of children: Not on file  . Years of education: Not on file  . Highest education level: Not on file  Occupational History  . Not on  file  Tobacco Use  . Smoking status: Former Smoker    Packs/day: 2.00    Years: 30.00    Pack years: 60.00    Types: Cigarettes    Quit date: 1992    Years since quitting: 30.3  . Smokeless tobacco: Never Used  . Tobacco comment: quit smoking around 1993  Substance and Sexual Activity  . Alcohol use: Yes    Alcohol/week: 12.0 standard drinks    Types: 6 Glasses of wine, 6 Shots of liquor per week    Comment: none since gallbladder issues  . Drug use: Yes    Types: Marijuana  . Sexual activity: Yes  Other Topics Concern  . Not on file  Social History Narrative   Not exercising.  Married.   Has 2 children, 2 grandchildren, one child [place], [place], one in [place], [place].   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 albuterol (VENTOLIN  HFA) 108 (90 Base) MCG/ACT inhaler Inhale 2 puffs into the lungs every 6 (six) hours as needed for wheezing or shortness of breath. 18 g 0  . atorvastatin (LIPITOR) 80 MG tablet Take 1 tablet (80 mg total) by mouth daily. Please make overdue appt with Dr. Radford Pax before anymore refills. Thank you 1st attempt 30 tablet 0  . Fluticasone-Umeclidin-Vilant (TRELEGY ELLIPTA) 100-62.5-25 MCG/INH AEPB Inhale 1 puff into the lungs daily. 28 each 0  . glucosamine-chondroitin 500-400 MG tablet Take 1 tablet by mouth daily.    Marland Kitchen omeprazole (PRILOSEC) 20 MG capsule Take by mouth.    . terazosin (HYTRIN) 10 MG capsule TAKE 1 CAPSULE BY MOUTH EVERY DAY 90 capsule 0  . Turmeric (QC TUMERIC COMPLEX PO) Take 1,000 mg by mouth daily.     No facility-administered medications prior to visit.         Objective:   Physical Exam  Vitals:   01/21/21 1512  BP: 118/72  Pulse: 74  Temp: 98.2 F (36.8 C)  TempSrc: Temporal  SpO2: 94%  Weight: 239 lb 12.8 oz (108.8 kg)  Height: 5\' 9"  (1.753 m)   Gen: Pleasant, well-nourished, in no distress,  normal affect  ENT: No lesions,  mouth clear,  oropharynx clear, no postnasal drip  Neck: No JVD, no stridor  Lungs: No use of accessory muscles, no crackles or wheezing on normal respiration, no wheeze on forced expiration  Cardiovascular: RRR, heart sounds normal, no murmur or gallops, no peripheral edema  Musculoskeletal: No deformities, no cyanosis or clubbing  Neuro: alert, awake, non focal  Skin: Warm, no lesions or rash     Assessment & Plan:  Pleural effusion Unfortunately consistent with malignancy although we did not establish this on his initial thoracentesis.  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  He does have a peripheral right lower lobe superior segmental nodule.  This would potentially be a target for biopsy although will be difficult or impossible to do so if his right lower lobe is collapsed.  I think the most straightforward approach at this time is to repeat his thoracentesis, see if the cytology is more revealing.  I suspect he will also need a PET scan to look for pleural disease, better evaluate the right lower lobe nodule.  If the pleural fluid does not give [place] an answer then he still may need either a navigational bronchoscopy or TTNA.  It is possible that the PET scan could give [place] alternative target as well.  I will try to get the repeat thoracentesis done on Friday 5/20, rush the cytology results so that they can be available for review when he sees Dr. Julien Nordmann on 5/23.  COPD (chronic obstructive pulmonary disease) ([place]) New diagnosis.  Plan to continue his Trelegy, albuterol as needed.  45 minutes spent coordinating care, arranging for thoracentesis, discussing plans, reviewing images with  the patient  Baltazar Apo, MD, PhD 01/21/2021, 3:42 PM [facility] [phone] or if no answer before 7:00PM call [phone] For any issues after 7:00PM please call eLink [phone]

## 2021-01-21 NOTE — Assessment & Plan Note (Signed)
New diagnosis.  Plan to continue his Trelegy, albuterol as needed.

## 2021-01-21 NOTE — Patient Instructions (Addendum)
We will arrange for an outpatient right thoracentesis at [facility] on Friday 5/20 at 2:00 in the afternoon.  You will be contacted with the time and location to arrive. Please try to arrange for a ride a designated driver as we may want to use light sedation for the procedure. We will perform a chest x-ray here in the office. Follow-up with Dr. Julien Nordmann as planned on 5/23.  Depending on your repeat thoracentesis results we will likely need to perform other studies to better evaluate your right lung and your pulmonary nodule.  This might include a PET scan or an alternative type of biopsy. Follow with Dr Lamonte Sakai in 1 month

## 2021-01-21 NOTE — Progress Notes (Signed)
Subjective:  Guy Evans is a 70 y.o. male who presents for Chief Complaint  Patient presents with  . Shortness of Breath    2 week follow up sob      Here for hospital follow-up.  Here alone today.  At his last visit he was hypoxemic and having trouble breathing and we referred him to the hospital emergency department  He was hospitalized May 10 through Jan 15, 2021 at [facility]  Diagnosis included hypoxia, right-sided pleural effusion, emphysema, and chronic issues of obesity and BPH  He had thoracentesis, was on oxygen therapy and has been referred to oncology due to concern for possible cancer.  He has follow-up planned with oncology and pulmonology.  Currently he notes being much improved, but is getting a little shorter of breath each day.   Has some SOB yesterday outside pruning some bushes.    Was sent home with oxygen, 2L/min. He had not used it from hospital discharge until yesterday when he came in SOB from pruning some bushes.    He is caregiver for his wife.   She is not doing the best.  She has some balance issues.    He helps take care of her and her medications.   She is aware of his condition.   No other new symptoms.    He notes leading up to last visit he does recall feeling some fatigue for a few months, was needing some naps due to fatigue which was unusual him.  The SOB started in recent weeks.  No recent fever, no night sweats, no chills, no major cough.    Still planning to move houses soon, but still in [place].  Had planned to move to [place] last year, but they discontinued that plan.   No other aggravating or relieving factors.    No other c/o.  Past Medical History:  Diagnosis Date  . Enlarged prostate    takes Terazosin daily  . Gastroparesis   . GERD (gastroesophageal reflux disease)    occasionally but no meds required  . H/O cardiovascular stress test 10/2019   nuclear, normal, Dr. Fransico Him  . History of colon polyps    . History of gastric ulcer   . Joint pain    knees and ankles  . Urinary frequency    Current Outpatient Medications on File Prior to Visit  Medication Sig Dispense Refill  . albuterol (VENTOLIN HFA) 108 (90 Base) MCG/ACT inhaler Inhale 2 puffs into the lungs every 6 (six) hours as needed for wheezing or shortness of breath. 18 g 0  . atorvastatin (LIPITOR) 80 MG tablet Take 1 tablet (80 mg total) by mouth daily. Please make overdue appt with Dr. Radford Pax before anymore refills. Thank you 1st attempt 30 tablet 0  . Fluticasone-Umeclidin-Vilant (TRELEGY ELLIPTA) 100-62.5-25 MCG/INH AEPB Inhale 1 puff into the lungs daily. 28 each 0  . glucosamine-chondroitin 500-400 MG tablet Take 1 tablet by mouth daily.    Marland Kitchen omeprazole (PRILOSEC) 20 MG capsule Take by mouth.    . terazosin (HYTRIN) 10 MG capsule TAKE 1 CAPSULE BY MOUTH EVERY DAY 90 capsule 0  . Turmeric (QC TUMERIC COMPLEX PO) Take 1,000 mg by mouth daily.     No current facility-administered medications on file prior to visit.    The following portions of the patient's history were reviewed and updated as appropriate: allergies, current medications, past family history, past medical history, past social history, past surgical history and problem list.  ROS Otherwise  as in subjective above    Objective: BP 126/70   Pulse 71   Ht 5\' 8"  (1.727 m)   Wt 240 lb 6.4 oz (109 kg)   SpO2 91%   BMI 36.55 kg/m  Wt Readings from Last 3 Encounters:  01/21/21 240 lb 6.4 oz (109 kg)  01/13/21 243 lb 2.7 oz (110.3 kg)  01/13/21 243 lb 3.2 oz (110.3 kg)    General appearance: alert, no distress, well developed, well nourished HEENT: normocephalic, sclerae anicteric, conjunctiva pink and moist, TMs pearly, nares patent, no discharge or erythema, pharynx normal Oral cavity: MMM, no lesions Neck: supple, no lymphadenopathy, no thyromegaly, no masses, no JVD or bruits Heart: RRR, normal S1, S2, no murmurs Lungs: Decreased lung sounds on the  right side particularly lower fields, otherwise clear, no wheezes  Abdomen: +bs, soft, non tender, non distended, no masses, no hepatomegaly, no splenomegaly Pulses: 2+ radial pulses, 2+ pedal pulses, normal cap refill Ext: no edema     Assessment: Encounter Diagnoses  Name Primary?  . Hypoxia Yes  . Acute respiratory failure with hypoxia ([place])   . Pleural effusion   . Pulmonary emphysema, unspecified emphysema type ([place])   . Former smoker      Plan: I reviewed his discharge summary , medications reconciled.  No med changes other than addition of oxygen therapy for prn use.  He is using the Trelegy as well. I had started this recently, but he may or may not need this given the effusion being the cause of his recent SOB.  Continue to monitor and he will inquire with pulmonology about this as well.  C/t oxygen prn.  He has follow-up oncology appointment is next week given the recent findings on x-ray, recent labs and suspicion for malignancy of the lungs  Follow up with pulmonology appt this afternoon.  Talk with pulmonology today about a home pulse oximeter device.    He will ask pulmonology about pulse oximeter device to have at home.  I recommend a chest x-ray today which I assume will be done at pulmonology today he does have gradual worsening of shortness of breath since coming to the hospital so I suspect the effusion is worsening again.  Lung sounds still abnormal  Other items of concern: He had been on the schedule to have a bone density test to screen for osteoporos.  I advise he put this on the back burner for now  Obvious know if he had mentioned going to a clinic about weight loss and was contemplating some type of weight loss plastic surgery type therapy.  I also advise he put this on hold for now and concentrate on healthy diet and getting some exercise instead of relying on some type of procedure to help lose weight.  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  We discussed not overdoing it and getting out of breath too easy.   Giovan was seen today for shortness of breath.  Diagnoses and all orders for this visit:  Hypoxia  Acute respiratory failure with hypoxia (HCC)  Pleural effusion  Pulmonary emphysema, unspecified emphysema type [facility])  Former smoker    Follow up: with oncology and pulmonology as scheduled

## 2021-01-21 NOTE — Assessment & Plan Note (Signed)
Unfortunately consistent with malignancy although we did not establish this on his initial thoracentesis.  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  He does have a peripheral right lower lobe superior segmental nodule.  This would potentially be a target for biopsy although will be difficult or impossible to do so if his right lower lobe is collapsed.  I think the most straightforward approach at this time is to repeat his thoracentesis, see if the cytology is more revealing.  I suspect he will also need a PET scan to look for pleural disease, better evaluate the right lower lobe nodule.  If the pleural fluid does not give [place] an answer then he still may need either a navigational bronchoscopy or TTNA.  It is possible that the PET scan could give [place] alternative target as well.  I will try to get the repeat thoracentesis done on Friday 5/20, rush the cytology results so that they can be available for review when he sees Dr. Julien Nordmann on 5/23.

## 2021-01-21 NOTE — Telephone Encounter (Signed)
I called and scheduled [name] to see Dr. Julien Nordmann next week. He verbalized understanding of appt time and place.

## 2021-01-23 ENCOUNTER — Encounter (HOSPITAL_COMMUNITY)
Admission: RE | Disposition: A | Discharge status: Home / Self Care | Payer: Self-pay | Source: Home / Self Care | Attending: Emergency Medicine

## 2021-01-23 ENCOUNTER — Ambulatory Visit (HOSPITAL_COMMUNITY)
Admission: RE | Admit: 2021-01-23 | Discharge: 2021-01-23 | Disposition: A | Discharge status: Home / Self Care | Payer: Medicare HMO | Attending: Acute Care | Admitting: Acute Care

## 2021-01-23 ENCOUNTER — Ambulatory Visit (HOSPITAL_COMMUNITY): Payer: Medicare HMO

## 2021-01-23 ENCOUNTER — Encounter (HOSPITAL_COMMUNITY): Payer: Self-pay | Admitting: Emergency Medicine

## 2021-01-23 DIAGNOSIS — Z79899 Other long term (current) drug therapy: Secondary | ICD-10-CM | POA: Insufficient documentation

## 2021-01-23 DIAGNOSIS — I517 Cardiomegaly: Secondary | ICD-10-CM | POA: Diagnosis not present

## 2021-01-23 DIAGNOSIS — K219 Gastro-esophageal reflux disease without esophagitis: Secondary | ICD-10-CM | POA: Diagnosis not present

## 2021-01-23 DIAGNOSIS — R0600 Dyspnea, unspecified: Secondary | ICD-10-CM | POA: Diagnosis not present

## 2021-01-23 DIAGNOSIS — I10 Essential (primary) hypertension: Secondary | ICD-10-CM | POA: Insufficient documentation

## 2021-01-23 DIAGNOSIS — Z820 Family history of epilepsy and other diseases of the nervous system: Secondary | ICD-10-CM | POA: Diagnosis not present

## 2021-01-23 DIAGNOSIS — J90 Pleural effusion, not elsewhere classified: Secondary | ICD-10-CM | POA: Insufficient documentation

## 2021-01-23 DIAGNOSIS — Z7951 Long term (current) use of inhaled steroids: Secondary | ICD-10-CM | POA: Diagnosis not present

## 2021-01-23 DIAGNOSIS — J449 Chronic obstructive pulmonary disease, unspecified: Secondary | ICD-10-CM | POA: Diagnosis not present

## 2021-01-23 DIAGNOSIS — Z87891 Personal history of nicotine dependence: Secondary | ICD-10-CM | POA: Diagnosis not present

## 2021-01-23 DIAGNOSIS — Z8249 Family history of ischemic heart disease and other diseases of the circulatory system: Secondary | ICD-10-CM | POA: Insufficient documentation

## 2021-01-23 DIAGNOSIS — R846 Abnormal cytological findings in specimens from respiratory organs and thorax: Secondary | ICD-10-CM | POA: Diagnosis not present

## 2021-01-23 DIAGNOSIS — Z9889 Other specified postprocedural states: Secondary | ICD-10-CM

## 2021-01-23 HISTORY — PX: THORACENTESIS: SHX235

## 2021-01-23 LAB — BODY FLUID CELL COUNT WITH DIFFERENTIAL
Eos, Fluid: 0 %
Lymphs, Fluid: 91 %
Monocyte-Macrophage-Serous Fluid: 7 % — ABNORMAL LOW (ref 50–90)
Neutrophil Count, Fluid: 2 % (ref 0–25)
Total Nucleated Cell Count, Fluid: 410 cu mm (ref 0–1000)

## 2021-01-23 SURGERY — THORACENTESIS
Anesthesia: Moderate Sedation

## 2021-01-23 MED ORDER — FENTANYL CITRATE (PF) 100 MCG/2ML IJ SOLN
INTRAMUSCULAR | Status: DC | PRN
  Administered 2021-01-23: 12.5 ug via INTRAVENOUS

## 2021-01-23 MED ORDER — MIDAZOLAM HCL (PF) 10 MG/2ML IJ SOLN
INTRAMUSCULAR | Status: DC | PRN
  Administered 2021-01-23 (×2): .5 mg via INTRAVENOUS

## 2021-01-23 MED ORDER — FENTANYL CITRATE (PF) 100 MCG/2ML IJ SOLN
INTRAMUSCULAR | Status: AC
  Filled 2021-01-23: qty 2

## 2021-01-23 MED ORDER — MIDAZOLAM HCL (PF) 5 MG/ML IJ SOLN
INTRAMUSCULAR | Status: AC
  Filled 2021-01-23: qty 1

## 2021-01-23 MED ORDER — SODIUM CHLORIDE 0.9 % IV SOLN: INTRAVENOUS | Status: DC

## 2021-01-23 NOTE — Procedures (Signed)
Thoracentesis  Procedure Note  Axcel Horsch  [id number]  04-Apr-1951  Date:01/23/21  Time:3:06 PM   Provider Performing:Pete E Kary Kos   Procedure: Thoracentesis with imaging guidance (15868)  Indication(s) Pleural Effusion  Consent Risks of the procedure as well as the alternatives and risks of each were explained to the patient and/or caregiver.  Consent for the procedure was obtained and is signed in the bedside chart  Anesthesia Topical only with 1% lidocaine    Time Out Verified patient identification, verified procedure, site/side was marked, verified correct patient position, special equipment/implants available, medications/allergies/relevant history reviewed, required imaging and test results available.   Sterile Technique Maximal sterile technique including full sterile barrier drape, hand hygiene, sterile gown, sterile gloves, mask, hair covering, sterile ultrasound probe cover (if used).  Procedure Description Ultrasound was used to identify appropriate pleural anatomy for placement and overlying skin marked.  Area of drainage cleaned and draped in sterile fashion. Lidocaine was used to anesthetize the skin and subcutaneous tissue.  2000 cc's of clear yellow appearing fluid was drained from the right pleural space. Catheter then removed and bandaid applied to site.   Complications/Tolerance None; patient tolerated the procedure well. Chest X-ray is ordered to confirm no post-procedural complication.   EBL Minimal   Specimen(s) Pleural fluid   Pt did get slightly hypotensive and light headed at end of procedure. He had received 1mg  versed and fent 12.5MCG X 1 treated w/ allowing him to sit back in stretcher.   Erick Colace ACNP-BC [place] Pager # [phone] OR # [phone] if no answer

## 2021-01-23 NOTE — Interval H&P Note (Signed)
History and Physical Interval Note:  01/23/2021 2:23 PM  Guy Evans  has presented today for surgery, with the diagnosis of Thoracentesis.  The various methods of treatment have been discussed with the patient and family. After consideration of risks, benefits and other options for treatment, the patient has consented to  Procedure(s): THORACENTESIS (N/A) as a surgical intervention.  The patient's history has been reviewed, patient examined, no change in status, stable for surgery.  I have reviewed the patient's chart and labs.  Questions were answered to the patient's satisfaction.     Collene Gobble

## 2021-01-23 NOTE — Discharge Instructions (Signed)
Thoracentesis, Care After This sheet gives you information about how to care for yourself after your procedure. Your health care provider may also give you more specific instructions. If you have problems or questions, contact your health care provider. What can I expect after the procedure? After your procedure, it is common to have some pain at the site where the needle was inserted (puncture site). Follow these instructions at home: Care of the puncture site  Follow instructions from your health care provider about how to take care of your puncture site. Make sure you: ? Wash your hands with soap and water before you change your bandage (dressing). If soap and water are not available, use hand sanitizer. ? Change your dressing as told by your health care provider.  Check the puncture site every day for signs of infection. Check for: ? Redness, swelling, or pain. ? Fluid or blood. ? Warmth. ? Pus or a bad smell.  Do not take baths, swim, or use a hot tub until your health care provider approves. General instructions  Take over-the-counter and prescription medicines only as told by your health care provider.  Do not drive for 24 hours if you were given a medicine to help you relax (sedative) during your procedure.  Drink enough fluid to keep your urine pale yellow.  You may return to your normal diet and normal activities as told by your health care provider.  Keep all follow-up visits as told by your health care provider. This is important.   Contact a health care provider if you:  Have redness, swelling, or pain at your puncture site.  Have fluid or blood coming from your puncture site.  Notice that your puncture site feels warm to the touch.  Have pus or a bad smell coming from your puncture site.  Have a fever.  Have chills.  Have nausea or vomiting.  Have trouble breathing.  Develop a worsening cough. Get help right away if you:  Have extreme shortness of  breath.  Develop chest pain.  Faint or feel light-headed. Summary  After your procedure, it is common to have some pain at the site where the needle was inserted (puncture site).  Wash your hands with soap and water before you change your bandage (dressing).  Check your puncture site every day for signs of infection.  Take over-the-counter and prescription medicines only as told by your health care provider. This information is not intended to replace advice given to you by your health care provider. Make sure you discuss any questions you have with your health care provider. Document Revised: 04/24/2020 Document Reviewed: 04/24/2020 Elsevier Patient Education  2021 [employer]  

## 2021-01-25 ENCOUNTER — Encounter (HOSPITAL_COMMUNITY): Payer: Self-pay | Admitting: Emergency Medicine

## 2021-01-26 ENCOUNTER — Other Ambulatory Visit: Payer: Self-pay

## 2021-01-26 ENCOUNTER — Inpatient Hospital Stay: Payer: Medicare HMO

## 2021-01-26 ENCOUNTER — Encounter: Payer: Self-pay | Admitting: Internal Medicine

## 2021-01-26 ENCOUNTER — Inpatient Hospital Stay: Payer: Medicare HMO | Attending: Internal Medicine | Admitting: Internal Medicine

## 2021-01-26 VITALS — BP 134/63 | HR 79 | Temp 97.9°F | Resp 20 | Ht 69.0 in | Wt 238.5 lb

## 2021-01-26 DIAGNOSIS — C3490 Malignant neoplasm of unspecified part of unspecified bronchus or lung: Secondary | ICD-10-CM | POA: Diagnosis not present

## 2021-01-26 DIAGNOSIS — J90 Pleural effusion, not elsewhere classified: Secondary | ICD-10-CM | POA: Diagnosis not present

## 2021-01-26 DIAGNOSIS — Z0000 Encounter for general adult medical examination without abnormal findings: Secondary | ICD-10-CM

## 2021-01-26 DIAGNOSIS — C450 Mesothelioma of pleura: Secondary | ICD-10-CM | POA: Diagnosis not present

## 2021-01-26 LAB — CMP (CANCER CENTER ONLY)
ALT: 10 U/L (ref 0–44)
AST: 12 U/L — ABNORMAL LOW (ref 15–41)
Albumin: 3 g/dL — ABNORMAL LOW (ref 3.5–5.0)
Alkaline Phosphatase: 88 U/L (ref 38–126)
Anion gap: 5 (ref 5–15)
BUN: 12 mg/dL (ref 8–23)
CO2: 29 mmol/L (ref 22–32)
Calcium: 8.9 mg/dL (ref 8.9–10.3)
Chloride: 104 mmol/L (ref 98–111)
Creatinine: 0.99 mg/dL (ref 0.61–1.24)
GFR, Estimated: >60 mL/min (ref >60)
Glucose, Bld: 160 mg/dL — ABNORMAL HIGH (ref 70–99)
Potassium: 4.1 mmol/L (ref 3.5–5.1)
Sodium: 138 mmol/L (ref 135–145)
Total Bilirubin: 0.6 mg/dL (ref 0.3–1.2)
Total Protein: 6.3 g/dL — ABNORMAL LOW (ref 6.5–8.1)

## 2021-01-26 LAB — CBC WITH DIFFERENTIAL (CANCER CENTER ONLY)
Abs Immature Granulocytes: 0.02 10*3/uL (ref 0.00–0.07)
Basophils Absolute: 0 10*3/uL (ref 0.0–0.1)
Basophils Relative: 1 %
Eosinophils Absolute: 0.1 10*3/uL (ref 0.0–0.5)
Eosinophils Relative: 1 %
HCT: 37.5 % — ABNORMAL LOW (ref 39.0–52.0)
Hemoglobin: 12.3 g/dL — ABNORMAL LOW (ref 13.0–17.0)
Immature Granulocytes: 0 %
Lymphocytes Relative: 17 %
Lymphs Abs: 1.1 10*3/uL (ref 0.7–4.0)
MCH: 30.3 pg (ref 26.0–34.0)
MCHC: 32.8 g/dL (ref 30.0–36.0)
MCV: 92.4 fL (ref 80.0–100.0)
Monocytes Absolute: 0.5 10*3/uL (ref 0.1–1.0)
Monocytes Relative: 8 %
Neutro Abs: 4.7 10*3/uL (ref 1.7–7.7)
Neutrophils Relative %: 73 %
Platelet Count: 251 10*3/uL (ref 150–400)
RBC: 4.06 MIL/uL — ABNORMAL LOW (ref 4.22–5.81)
RDW: 13.1 % (ref 11.5–15.5)
WBC Count: 6.5 10*3/uL (ref 4.0–10.5)
nRBC: 0 % (ref 0.0–0.2)

## 2021-01-26 NOTE — Progress Notes (Signed)
[place] [phone]   [phone]  CONSULT NOTE  REFERRING PHYSICIAN: Dr. Baltazar Apo  REASON FOR CONSULTATION:  70 years old white male with suspicious malignant pleural mesothelioma.  HPI Guy Evans is a 70 y.o. male with past medical history significant for GERD, gastroparesis, benign prostatic hypertrophy, history of gastric ulcer and colon polyps as well as hypertension and COPD.  The patient presented to the hospital on 01/13/2021 complaining of 3-4 weeks of cough with worsening dyspnea.  He was previously seen by his primary care physician and started on albuterol and Trelegy with no improvement.  Chest x-ray followed by CT angiogram of the chest on 01/13/2021 showed no acute pulmonary embolus but there was large right-sided pleural effusion with compressive atelectasis of the right lung and mediastinal shift of contents to the left.  There was suspected fluid and/or debris's within the right bronchus with narrowed appearance of the right greater than left bronchi and evidence for pulmonary emphysema.  The patient underwent right-sided thoracentesis on 01/14/2021 with drainage of 2 L of amber appearing fluid.  The final pathology ([id number]) showed atypical cells present. The atypical cells present are positive for WT1, calretinin but negative for moc-31, P40 and TTF-1. Overall, the atypical cells are favored to be mesothelial in origin.  The patient underwent another right-sided thoracentesis with drainage of 1800 mL of pleural fluid but the final cytology is still pending  The patient was referred to me today for further evaluation and recommendation regarding his condition. When seen today he is feeling fine with no concerning complaints except for mild cough and shortness of breath with exertion but no significant chest pain or hemoptysis.  He denied having any nausea, vomiting, diarrhea or constipation.  He has no headache or visual changes.   He lost few pounds from the fluid drainage. Family history significant for mother with dementia and father had heart disease. The patient is married and has 2 sons.  He used to work as a Metallurgist.  He was accompanied today by his wife Pamala Hurry.  He has a history of smoking up to 2 pack/day for 25 years and quit in 1992.  He drinks few glasses of wine every week and no history of drug abuse except for marijuana in the past.  HPI  Past Medical History:  Diagnosis Date  . Enlarged prostate    takes Terazosin daily  . Gastroparesis   . GERD (gastroesophageal reflux disease)    occasionally but no meds required  . H/O cardiovascular stress test 10/2019   nuclear, normal, Dr. Fransico Him  . History of colon polyps   . History of gastric ulcer   . Joint pain    knees and ankles  . Urinary frequency     Past Surgical History:  Procedure Laterality Date  . CHOLECYSTECTOMY N/A 11/19/2013   Procedure: LAPAROSCOPIC CHOLECYSTECTOMY;  Surgeon: Harl Bowie, MD;  Location: [place];  Service: General;  Laterality: N/A;  . COLONOSCOPY    . ESOPHAGOGASTRODUODENOSCOPY    . skin spots removed and tested     done in the office  . THORACENTESIS N/A 01/14/2021   Procedure: Mathews Robinsons;  Surgeon: Collene Gobble, MD;  Location: [facility] ENDOSCOPY;  Service: Cardiopulmonary;  Laterality: N/A;  . THORACENTESIS N/A 01/23/2021   Procedure: THORACENTESIS;  Surgeon: Collene Gobble, MD;  Location: [facility] ENDOSCOPY;  Service: Cardiopulmonary;  Laterality: N/A;    Family History  Problem Relation Age of Onset  .  Dementia Mother   . Heart disease Father 80       MI at 79yo, angina  . Obesity Father   . Cancer Neg Hx   . Stroke Neg Hx   . Diabetes Neg Hx     Social History Social History   Tobacco Use  . Smoking status: Former Smoker    Packs/day: 2.00    Years: 30.00    Pack years: 60.00    Types: Cigarettes    Quit date: 1992    Years since quitting: 30.4  . Smokeless tobacco:  Never Used  . Tobacco comment: quit smoking around 1993  Substance Use Topics  . Alcohol use: Yes    Alcohol/week: 12.0 standard drinks    Types: 6 Glasses of wine, 6 Shots of liquor per week    Comment: none since gallbladder issues  . Drug use: Yes    Types: Marijuana    No Known Allergies  Current Outpatient Medications  Medication Sig Dispense Refill  . albuterol (VENTOLIN HFA) 108 (90 Base) MCG/ACT inhaler Inhale 2 puffs into the lungs every 6 (six) hours as needed for wheezing or shortness of breath. 18 g 0  . atorvastatin (LIPITOR) 80 MG tablet Take 1 tablet (80 mg total) by mouth daily. Please make overdue appt with Dr. Radford Pax before anymore refills. Thank you 1st attempt 30 tablet 0  . Fluticasone-Umeclidin-Vilant (TRELEGY ELLIPTA) 100-62.5-25 MCG/INH AEPB Inhale 1 puff into the lungs daily. 28 each 0  . glucosamine-chondroitin 500-400 MG tablet Take 1 tablet by mouth daily.    Marland Kitchen omeprazole (PRILOSEC) 20 MG capsule Take by mouth.    . terazosin (HYTRIN) 10 MG capsule TAKE 1 CAPSULE BY MOUTH EVERY DAY 90 capsule 0  . Turmeric (QC TUMERIC COMPLEX PO) Take 1,000 mg by mouth daily.     No current facility-administered medications for this visit.    Review of Systems  Constitutional: positive for fatigue and weight loss Eyes: negative Ears, nose, mouth, throat, and face: negative Respiratory: positive for cough and dyspnea on exertion Cardiovascular: negative Gastrointestinal: negative Genitourinary:negative Integument/breast: negative Hematologic/lymphatic: negative Musculoskeletal:negative Neurological: negative Behavioral/Psych: negative Endocrine: negative Allergic/Immunologic: negative  Physical Exam  UMP:NTIRW, healthy, no distress, well nourished and well developed SKIN: skin color, texture, turgor are normal, no rashes or significant lesions HEAD: Normocephalic, No masses, lesions, tenderness or abnormalities EYES: normal, PERRLA, Conjunctiva are pink and  non-injected EARS: External ears normal, Canals clear OROPHARYNX:no exudate, no erythema and lips, buccal mucosa, and tongue normal  NECK: supple, no adenopathy, no JVD LYMPH:  no palpable lymphadenopathy, no hepatosplenomegaly LUNGS: decreased breath sounds HEART: regular rate & rhythm, no murmurs and no gallops ABDOMEN:abdomen soft, non-tender, normal bowel sounds and no masses or organomegaly BACK: No CVA tenderness, Range of motion is normal EXTREMITIES:no joint deformities, effusion, or inflammation, no edema  NEURO: alert & oriented x 3 with fluent speech, no focal motor/sensory deficits  PERFORMANCE STATUS: ECOG 1  LABORATORY DATA: Lab Results  Component Value Date   WBC 6.5 01/26/2021   HGB 12.3 (L) 01/26/2021   HCT 37.5 (L) 01/26/2021   MCV 92.4 01/26/2021   PLT 251 01/26/2021      Chemistry      Component Value Date/Time   NA 137 01/13/2021 1130   NA 141 10/20/2020 1030   K 3.9 01/13/2021 1130   CL 104 01/13/2021 1130   CO2 26 01/13/2021 1130   BUN 11 01/13/2021 1130   BUN 13 10/20/2020 1030   CREATININE 0.93 01/13/2021  1130   CREATININE 1.03 10/18/2016 1027      Component Value Date/Time   CALCIUM 9.1 01/13/2021 1130   ALKPHOS 94 01/13/2021 1130   AST 15 01/13/2021 1130   ALT 15 01/13/2021 1130   BILITOT 0.6 01/13/2021 1130   BILITOT 0.7 10/20/2020 1030       RADIOGRAPHIC STUDIES: DG Chest 2 View  Result Date: 01/23/2021 CLINICAL DATA:  Pleural effusion EXAM: CHEST - 2 VIEW COMPARISON:  01/14/2021 FINDINGS: Large right pleural effusion with increase from 7 days ago. Normal heart size where not obscured. Clear left lung with mild scarring or atelectasis at the base. IMPRESSION: Large right pleural effusion with increase from 7 days ago. Electronically Signed   By: Monte Fantasia M.D.   On: 01/23/2021 10:56   CT CHEST WO CONTRAST  Result Date: 01/14/2021 CLINICAL DATA:  Pleural effusion, status post thoracentesis, persistent cough EXAM: CT CHEST  WITHOUT CONTRAST TECHNIQUE: Multidetector CT imaging of the chest was performed following the standard protocol without IV contrast. COMPARISON:  CT chest angiogram, 01/13/2021 FINDINGS: Cardiovascular: Aortic atherosclerosis. Normal heart size. Three-vessel coronary artery calcifications. No pericardial effusion. Mediastinum/Nodes: No enlarged mediastinal, hilar, or axillary lymph nodes. Small hiatal hernia. Thyroid gland, trachea, and esophagus demonstrate no significant findings. Lungs/Pleura: Moderate right pleural effusion, diminished in volume compared to prior examination. There is paraseptal emphysema with sizable bulla. There is a lobulated nodule of the aerated portion of the superior segment right lower lobe measuring 1.4 x 0.9 cm (series 5, image 66). There appears to be some right-sided pleural thickening, particularly in the right aspect of the costophrenic recess (series 3, image 119). Upper Abdomen: No acute abnormality. Musculoskeletal: No chest wall mass or suspicious bone lesions identified. IMPRESSION: 1. Moderate right pleural effusion, diminished in volume compared to prior examination, in keeping with recent thoracentesis. 2. There is a lobulated nodule of the aerated portion of the superior segment right lower lobe measuring 1.4 x 0.9 cm. There appears to be some right-sided pleural thickening, particularly in the right aspect of the costophrenic recess. Findings are concerning for malignancy and associated malignant effusion. Correlate with thoracentesis cytopathology and consider PET-CT for metabolic characterization. 3. Paraseptal emphysema. 4. Coronary artery disease. Aortic Atherosclerosis (ICD10-I70.0) and Emphysema (ICD10-J43.9). Electronically Signed   By: Eddie Candle M.D.   On: 01/14/2021 16:48   CT Angio Chest PE W and/or Wo Contrast  Result Date: 01/13/2021 CLINICAL DATA:  Pleural effusion shortness of breath EXAM: CT ANGIOGRAPHY CHEST WITH CONTRAST TECHNIQUE: Multidetector CT  imaging of the chest was performed using the standard protocol during bolus administration of intravenous contrast. Multiplanar CT image reconstructions and MIPs were obtained to evaluate the vascular anatomy. CONTRAST:  53mL OMNIPAQUE IOHEXOL 350 MG/ML SOLN COMPARISON:  Chest x-ray 01/13/2021 FINDINGS: Cardiovascular: Satisfactory opacification of the pulmonary arteries to the segmental level. No evidence of pulmonary embolism. Mild aortic atherosclerosis. No aneurysm. No dissection is seen. Coronary vascular calcification. Normal cardiac size. Trace pericardial effusion Mediastinum/Nodes: Midline trachea. No thyroid mass. No suspicious adenopathy. Esophagus within normal limits. Mediastinal shift to the left. Lungs/Pleura: Large right-sided pleural effusion with compressive atelectasis of the right lung. Underlying pulmonary emphysema with bulla and blebs. Narrowed appearance of the right and left main bronchi with possible fluid or debris in the right bronchus. Upper Abdomen: No acute abnormality. Cyst near the falciform ligament Musculoskeletal: No chest wall abnormality. No acute or significant osseous findings. Review of the MIP images confirms the above findings. IMPRESSION: 1. Negative for acute pulmonary embolus.  2. Large right-sided pleural effusion with compressive atelectasis of the right lung and mediastinal shift of contents to the left. Suspected fluid and or debris within the right bronchus with narrowed appearance of the right greater than left bronchi. 3. Pulmonary emphysema Aortic Atherosclerosis (ICD10-I70.0) and Emphysema (ICD10-J43.9). Electronically Signed   By: Donavan Foil M.D.   On: 01/13/2021 21:23   DG Chest Port 1 View  Result Date: 01/23/2021 CLINICAL DATA:  Post thoracentesis EXAM: PORTABLE CHEST 1 VIEW COMPARISON:  01/22/2019 FINDINGS: Two interval reduction in volume of the RIGHT pleural fluid following thoracentesis. No pneumothorax. LEFT lung clear. Stable large cardiac  silhouette IMPRESSION: 1. No evidence of pneumothorax. 2. Reduction in RIGHT pleural fluid Electronically Signed   By: Suzy Bouchard M.D.   On: 01/23/2021 15:31   DG Chest Port 1 View  Result Date: 01/14/2021 CLINICAL DATA:  Status post right-sided thoracentesis EXAM: PORTABLE CHEST 1 VIEW COMPARISON:  Jan 13, 2021 FINDINGS: No pneumothorax. Right pleural effusion smaller after thoracentesis. There is a persistent moderate right pleural effusion with areas of atelectasis and potential consolidation in the right middle and lower lobes. The left lung is clear. Heart size and pulmonary vascularity are normal. No adenopathy. There is aortic atherosclerosis. There is degenerative change in each shoulder. IMPRESSION: No pneumothorax. Right pleural effusion smaller after thoracentesis on the right. There is residual moderate right pleural effusion with areas of atelectasis and potential consolidation in portions of the right middle and lower lobes. Left lung clear. Stable cardiac silhouette. Aortic Atherosclerosis (ICD10-I70.0). Electronically Signed   By: Lowella Grip III M.D.   On: 01/14/2021 15:18   DG Chest Portable 1 View  Result Date: 01/13/2021 CLINICAL DATA:  Shortness of breath started 3 weeks ago. EXAM: PORTABLE CHEST 1 VIEW COMPARISON:  CT of the chest on 10/12/2019 FINDINGS: There is near complete opacification of RIGHT hemithorax with small amount of aerated lung in the perihilar region. The significant opacity in the RIGHT hemithorax obscures the RIGHT heart margin. Heart is probably normal in size.  LEFT lung is clear. IMPRESSION: Near complete opacification of the RIGHT hemithorax. Recommend further evaluation with CT of the chest. Electronically Signed   By: Nolon Nations M.D.   On: 01/13/2021 11:59    ASSESSMENT: This is a very pleasant 71 years old white male with highly suspicious malignant pleural effusion questionable for mesothelioma but the patient has no clear history for  asbestos exposure.  This could be also non-small cell lung cancer.  The final pathology is not conclusive so far and we are still waiting on the second cytology evaluation of the pleural effusion.  PLAN: I had a lengthy discussion with the patient and his wife about his condition and further investigation to confirm his diagnosis. I will order a PET scan for further evaluation of his disease and identify any other areas of concern for metastasis or possible lesion for biopsy. If the second cytology from the repeat thoracentesis is not conclusive, the patient may need to see cardiothoracic surgery for consideration of VATS and biopsy as well as placement of Pleurx catheter for drainage of the pleural effusion. I will see the patient back for follow-up visit in around 2 weeks for more detailed discussion of his treatment options based on the final pathology and staging work-up. If the final pathology is consistent with non-small cell lung cancer, I will add MRI of the brain to rule out brain metastasis. The patient was also advised to call immediately if he has  any further shortness of breath concerning for reaccumulation of the right pleural effusion. He was advised to call if he has any other concerning symptoms in the interval. The patient voices understanding of current disease status and treatment options and is in agreement with the current care plan.  All questions were answered. The patient knows to call the clinic with any problems, questions or concerns. We can certainly see the patient much sooner if necessary.  Thank you so much for allowing me to participate in the care of Guy Evans. I will continue to follow up the patient with you and assist in his care.  The total time spent in the appointment was 60 minutes.  Disclaimer: This note was dictated with voice recognition software. Similar sounding words can inadvertently be transcribed and may not be corrected upon review.   Eilleen Kempf Jan 26, 2021, 2:21 PM

## 2021-01-27 LAB — BODY FLUID CULTURE W GRAM STAIN: Culture: NO GROWTH

## 2021-01-28 ENCOUNTER — Encounter: Payer: Self-pay | Admitting: *Deleted

## 2021-01-28 NOTE — Progress Notes (Signed)
I followed up on Guy Evans plan of care.  Dr. Julien Nordmann is waiting for cytology and wants patient to get PET scan. I checked on cytology and this is not resulted back yet.  I did update Dr. Julien Nordmann. PET is not authorized. I will reach out to [facility] HIM to get this completed.

## 2021-01-29 ENCOUNTER — Encounter: Payer: Self-pay | Admitting: *Deleted

## 2021-01-29 ENCOUNTER — Telehealth: Payer: Self-pay | Admitting: Emergency Medicine

## 2021-01-29 LAB — CYTOLOGY - NON PAP

## 2021-01-29 NOTE — Progress Notes (Signed)
I received a message that PET scan is in review with insurance at this time. Waiting for confirmation and HIM will update me.

## 2021-01-29 NOTE — Telephone Encounter (Signed)
ATC LVTCB x1. If pt calls back ok to schedule him in nodule slot on 6/8 - 6/10 per RB.

## 2021-01-29 NOTE — Telephone Encounter (Signed)
Please let Mr Guy Evans know that his pleural fluid shows atypical cells identical to his first thoracentesis, but not definitive for cancer cells.  I'd like for him to get the PET scan that has been planned by Dr Julien Nordmann, and then we will all discuss whether he needs an alternative procedure to evaluate his lungs and pulmonary nodule further  We can try to see him 6/8 - 6/10 (he see Dr Julien Nordmann on 6/8).   Thanks.

## 2021-01-30 ENCOUNTER — Encounter: Payer: Self-pay | Admitting: *Deleted

## 2021-01-30 NOTE — Progress Notes (Signed)
I followed up on Guy Evans PET scan to see if this has been authorized. The scan is pending review, waiting on insurance.

## 2021-01-31 ENCOUNTER — Other Ambulatory Visit: Payer: Self-pay | Admitting: Cardiology

## 2021-01-31 ENCOUNTER — Other Ambulatory Visit: Payer: Self-pay | Admitting: Medical

## 2021-02-03 ENCOUNTER — Telehealth: Payer: Self-pay | Admitting: *Deleted

## 2021-02-03 NOTE — Telephone Encounter (Signed)
I received notification that Mr. Bellamy PET scan has been authorized. I called radiology and was given an appt for patient. I called Mr. Craver and updated on his appt for PET, time, place, location, and pre-procedure instructions.  He verbalized understanding.

## 2021-02-03 NOTE — Telephone Encounter (Signed)
Pt scheduled with Dr. Lamonte Sakai 02/12/21. Nothing further needed at this time

## 2021-02-05 ENCOUNTER — Other Ambulatory Visit: Payer: Self-pay

## 2021-02-05 ENCOUNTER — Other Ambulatory Visit: Payer: Self-pay | Admitting: Medical

## 2021-02-05 ENCOUNTER — Encounter (HOSPITAL_COMMUNITY)
Admission: RE | Admit: 2021-02-05 | Discharge: 2021-02-05 | Disposition: A | Discharge status: Home / Self Care | Payer: Medicare HMO | Source: Ambulatory Visit | Attending: Internal Medicine | Admitting: Internal Medicine

## 2021-02-05 ENCOUNTER — Ambulatory Visit
Admission: RE | Admit: 2021-02-05 | Discharge: 2021-02-05 | Disposition: A | Discharge status: Home / Self Care | Payer: Self-pay | Source: Ambulatory Visit

## 2021-02-05 DIAGNOSIS — D22111 Melanocytic nevi of right upper eyelid, including canthus: Secondary | ICD-10-CM | POA: Diagnosis not present

## 2021-02-05 DIAGNOSIS — J90 Pleural effusion, not elsewhere classified: Secondary | ICD-10-CM | POA: Diagnosis not present

## 2021-02-05 DIAGNOSIS — C3490 Malignant neoplasm of unspecified part of unspecified bronchus or lung: Secondary | ICD-10-CM | POA: Diagnosis not present

## 2021-02-05 DIAGNOSIS — D485 Neoplasm of uncertain behavior of skin: Secondary | ICD-10-CM | POA: Diagnosis not present

## 2021-02-05 DIAGNOSIS — D2271 Melanocytic nevi of right lower limb, including hip: Secondary | ICD-10-CM | POA: Diagnosis not present

## 2021-02-05 DIAGNOSIS — N400 Enlarged prostate without lower urinary tract symptoms: Secondary | ICD-10-CM | POA: Diagnosis not present

## 2021-02-05 DIAGNOSIS — L570 Actinic keratosis: Secondary | ICD-10-CM | POA: Diagnosis not present

## 2021-02-05 DIAGNOSIS — Z85828 Personal history of other malignant neoplasm of skin: Secondary | ICD-10-CM | POA: Diagnosis not present

## 2021-02-05 DIAGNOSIS — L821 Other seborrheic keratosis: Secondary | ICD-10-CM | POA: Diagnosis not present

## 2021-02-05 DIAGNOSIS — L814 Other melanin hyperpigmentation: Secondary | ICD-10-CM | POA: Diagnosis not present

## 2021-02-05 DIAGNOSIS — D225 Melanocytic nevi of trunk: Secondary | ICD-10-CM | POA: Diagnosis not present

## 2021-02-05 DIAGNOSIS — D3617 Benign neoplasm of peripheral nerves and autonomic nervous system of trunk, unspecified: Secondary | ICD-10-CM | POA: Diagnosis not present

## 2021-02-05 LAB — GLUCOSE, CAPILLARY: Glucose-Capillary: 101 mg/dL — ABNORMAL HIGH (ref 70–99)

## 2021-02-05 MED ORDER — FLUDEOXYGLUCOSE F - 18 (FDG) INJECTION
12.1000 | Freq: Once | INTRAVENOUS | Status: AC
  Administered 2021-02-05: 12.1 via INTRAVENOUS

## 2021-02-05 MED ORDER — TRELEGY ELLIPTA 100-62.5-25 MCG/INH IN AEPB: 1.0000 | INHALATION_SPRAY | Freq: Every day | RESPIRATORY_TRACT | 5 refills | Status: DC

## 2021-02-06 ENCOUNTER — Telehealth: Payer: Self-pay

## 2021-02-06 NOTE — Telephone Encounter (Signed)
Call report received from [place] regarding pts PET scan.  Discussed with Cassandra PA-C, who wants to know if pt is having SOB.  I have called the pt and advised he as fluid build up and inquired if he is having any SOB. Pt states he has "very mild" SOB but feels fine and does not feel he needs a thoracentesis at this time. Pt was advised, if over the weekend, this changes for any reason and he starts to have worsening SOB, to please go to the ER. Pt expressed understanding of this information.

## 2021-02-11 ENCOUNTER — Other Ambulatory Visit: Payer: Self-pay

## 2021-02-11 ENCOUNTER — Encounter: Payer: Self-pay | Admitting: Internal Medicine

## 2021-02-11 ENCOUNTER — Inpatient Hospital Stay: Payer: Medicare HMO | Attending: Internal Medicine | Admitting: Internal Medicine

## 2021-02-11 ENCOUNTER — Encounter: Payer: Self-pay | Admitting: *Deleted

## 2021-02-11 ENCOUNTER — Inpatient Hospital Stay: Payer: Medicare HMO

## 2021-02-11 DIAGNOSIS — K219 Gastro-esophageal reflux disease without esophagitis: Secondary | ICD-10-CM | POA: Diagnosis not present

## 2021-02-11 DIAGNOSIS — J90 Pleural effusion, not elsewhere classified: Secondary | ICD-10-CM | POA: Diagnosis not present

## 2021-02-11 DIAGNOSIS — M2550 Pain in unspecified joint: Secondary | ICD-10-CM | POA: Diagnosis not present

## 2021-02-11 DIAGNOSIS — Z79899 Other long term (current) drug therapy: Secondary | ICD-10-CM | POA: Diagnosis not present

## 2021-02-11 DIAGNOSIS — Z5111 Encounter for antineoplastic chemotherapy: Secondary | ICD-10-CM | POA: Insufficient documentation

## 2021-02-11 DIAGNOSIS — Z7982 Long term (current) use of aspirin: Secondary | ICD-10-CM | POA: Diagnosis not present

## 2021-02-11 DIAGNOSIS — I2510 Atherosclerotic heart disease of native coronary artery without angina pectoris: Secondary | ICD-10-CM | POA: Diagnosis not present

## 2021-02-11 DIAGNOSIS — C450 Mesothelioma of pleura: Secondary | ICD-10-CM | POA: Insufficient documentation

## 2021-02-11 DIAGNOSIS — C3490 Malignant neoplasm of unspecified part of unspecified bronchus or lung: Secondary | ICD-10-CM

## 2021-02-11 DIAGNOSIS — J439 Emphysema, unspecified: Secondary | ICD-10-CM | POA: Insufficient documentation

## 2021-02-11 DIAGNOSIS — R350 Frequency of micturition: Secondary | ICD-10-CM | POA: Insufficient documentation

## 2021-02-11 DIAGNOSIS — I700 Atherosclerosis of aorta: Secondary | ICD-10-CM | POA: Diagnosis not present

## 2021-02-11 DIAGNOSIS — Z8711 Personal history of peptic ulcer disease: Secondary | ICD-10-CM | POA: Insufficient documentation

## 2021-02-11 DIAGNOSIS — J910 Malignant pleural effusion: Secondary | ICD-10-CM | POA: Insufficient documentation

## 2021-02-11 DIAGNOSIS — Z86010 Personal history of colonic polyps: Secondary | ICD-10-CM | POA: Diagnosis not present

## 2021-02-11 DIAGNOSIS — N400 Enlarged prostate without lower urinary tract symptoms: Secondary | ICD-10-CM | POA: Diagnosis not present

## 2021-02-11 LAB — CBC WITH DIFFERENTIAL (CANCER CENTER ONLY)
Abs Immature Granulocytes: 0.03 10*3/uL (ref 0.00–0.07)
Basophils Absolute: 0 10*3/uL (ref 0.0–0.1)
Basophils Relative: 1 %
Eosinophils Absolute: 0.3 10*3/uL (ref 0.0–0.5)
Eosinophils Relative: 3 %
HCT: 38.4 % — ABNORMAL LOW (ref 39.0–52.0)
Hemoglobin: 12.5 g/dL — ABNORMAL LOW (ref 13.0–17.0)
Immature Granulocytes: 0 %
Lymphocytes Relative: 16 %
Lymphs Abs: 1.3 10*3/uL (ref 0.7–4.0)
MCH: 30 pg (ref 26.0–34.0)
MCHC: 32.6 g/dL (ref 30.0–36.0)
MCV: 92.3 fL (ref 80.0–100.0)
Monocytes Absolute: 0.8 10*3/uL (ref 0.1–1.0)
Monocytes Relative: 10 %
Neutro Abs: 5.5 10*3/uL (ref 1.7–7.7)
Neutrophils Relative %: 70 %
Platelet Count: 222 10*3/uL (ref 150–400)
RBC: 4.16 MIL/uL — ABNORMAL LOW (ref 4.22–5.81)
RDW: 13.2 % (ref 11.5–15.5)
WBC Count: 7.9 10*3/uL (ref 4.0–10.5)
nRBC: 0 % (ref 0.0–0.2)

## 2021-02-11 LAB — CMP (CANCER CENTER ONLY)
ALT: 8 U/L (ref 0–44)
AST: 11 U/L — ABNORMAL LOW (ref 15–41)
Albumin: 3.3 g/dL — ABNORMAL LOW (ref 3.5–5.0)
Alkaline Phosphatase: 94 U/L (ref 38–126)
Anion gap: 10 (ref 5–15)
BUN: 15 mg/dL (ref 8–23)
CO2: 27 mmol/L (ref 22–32)
Calcium: 8.9 mg/dL (ref 8.9–10.3)
Chloride: 104 mmol/L (ref 98–111)
Creatinine: 1.02 mg/dL (ref 0.61–1.24)
GFR, Estimated: >60 mL/min (ref >60)
Glucose, Bld: 107 mg/dL — ABNORMAL HIGH (ref 70–99)
Potassium: 4.2 mmol/L (ref 3.5–5.1)
Sodium: 141 mmol/L (ref 135–145)
Total Bilirubin: 0.5 mg/dL (ref 0.3–1.2)
Total Protein: 6.8 g/dL (ref 6.5–8.1)

## 2021-02-11 NOTE — Progress Notes (Signed)
[place] [phone]   [phone]) [phone]  OFFICE PROGRESS NOTE  Carlena Hurl, PA-C [address] [place] [address]  DIAGNOSIS: Highly suspicious malignant pleural effusion questionable for mesothelioma but the patient has no clear history for asbestos exposure.    Awaiting confirmation of the tissue diagnosis.  PRIOR THERAPY: None  CURRENT THERAPY: None  INTERVAL HISTORY: Guy Evans 70 y.o. male returns to the clinic today for follow-up visit.  The patient is feeling fine today with no concerning complaints.  He denied having any current chest pain but has mild shortness of breath with exertion with no cough or hemoptysis.  He denied having any recent weight loss or night sweats.  He has no nausea, vomiting, diarrhea or constipation.  He has no headache or visual changes.  The patient had a PET scan performed recently and he is here for evaluation and discussion of his PET scan results and recommendation regarding his condition.  MEDICAL HISTORY: Past Medical History:  Diagnosis Date  . Enlarged prostate    takes Terazosin daily  . Gastroparesis   . GERD (gastroesophageal reflux disease)    occasionally but no meds required  . H/O cardiovascular stress test 10/2019   nuclear, normal, Dr. Fransico Him  . History of colon polyps   . History of gastric ulcer   . Joint pain    knees and ankles  . Urinary frequency     ALLERGIES:  has No Known Allergies.  MEDICATIONS:  Current Outpatient Medications  Medication Sig Dispense Refill  . aspirin 81 MG EC tablet Take 1 tablet by mouth daily.    Marland Kitchen atorvastatin (LIPITOR) 80 MG tablet Take 1 tablet (80 mg total) by mouth daily. Please make overdue appt with Dr. Radford Pax before anymore refills. Thank you 1st attempt 30 tablet 0  . Fluticasone-Umeclidin-Vilant (TRELEGY ELLIPTA) 100-62.5-25 MCG/INH AEPB Inhale 1 puff into the lungs daily. 28 each 5  . glucosamine-chondroitin 500-400 MG  tablet Take 1 tablet by mouth daily.    Marland Kitchen omeprazole (PRILOSEC) 20 MG capsule Take by mouth.    . terazosin (HYTRIN) 10 MG capsule TAKE 1 CAPSULE BY MOUTH EVERY DAY 90 capsule 0  . Turmeric (QC TUMERIC COMPLEX PO) Take 1,000 mg by mouth daily.    Marland Kitchen albuterol (VENTOLIN HFA) 108 (90 Base) MCG/ACT inhaler TAKE 2 PUFFS BY MOUTH EVERY 6 HOURS AS NEEDED FOR WHEEZE OR SHORTNESS OF BREATH (Patient not taking: Reported on 02/11/2021) 18 each 0  . cholecalciferol (VITAMIN D3) 25 MCG (1000 UNIT) tablet      No current facility-administered medications for this visit.    SURGICAL HISTORY:  Past Surgical History:  Procedure Laterality Date  . CHOLECYSTECTOMY N/A 11/19/2013   Procedure: LAPAROSCOPIC CHOLECYSTECTOMY;  Surgeon: Harl Bowie, MD;  Location: [facility];  Service: General;  Laterality: N/A;  . COLONOSCOPY    . ESOPHAGOGASTRODUODENOSCOPY    . skin spots removed and tested     done in the office  . THORACENTESIS N/A 01/14/2021   Procedure: Mathews Robinsons;  Surgeon: Collene Gobble, MD;  Location: [facility] ENDOSCOPY;  Service: Cardiopulmonary;  Laterality: N/A;  . THORACENTESIS N/A 01/23/2021   Procedure: THORACENTESIS;  Surgeon: Collene Gobble, MD;  Location: [facility] ENDOSCOPY;  Service: Cardiopulmonary;  Laterality: N/A;    REVIEW OF SYSTEMS:  A comprehensive review of systems was negative except for: Respiratory: positive for dyspnea on exertion   PHYSICAL EXAMINATION: General appearance: alert, cooperative and no distress Head: Normocephalic,  without obvious abnormality, atraumatic Neck: no adenopathy, no JVD, supple, symmetrical, trachea midline and thyroid not enlarged, symmetric, no tenderness/mass/nodules Lymph nodes: Cervical, supraclavicular, and axillary nodes normal. Resp: clear to auscultation bilaterally Back: symmetric, no curvature. ROM normal. No CVA tenderness. Cardio: regular rate and rhythm, S1, S2 normal, no murmur, click, rub or gallop GI: soft, non-tender; bowel sounds normal; no  masses,  no organomegaly Extremities: extremities normal, atraumatic, no cyanosis or edema  ECOG PERFORMANCE STATUS: 1 - Symptomatic but completely ambulatory  Blood pressure (!) 126/59, pulse 66, temperature (!) 97 F (36.1 C), temperature source Tympanic, resp. rate 20, height 5\' 9"  (1.753 m), weight 238 lb (108 kg), SpO2 92 %.  LABORATORY DATA: Lab Results  Component Value Date   WBC 7.9 02/11/2021   HGB 12.5 (L) 02/11/2021   HCT 38.4 (L) 02/11/2021   MCV 92.3 02/11/2021   PLT 222 02/11/2021      Chemistry      Component Value Date/Time   NA 141 02/11/2021 0902   NA 141 10/20/2020 1030   K 4.2 02/11/2021 0902   CL 104 02/11/2021 0902   CO2 27 02/11/2021 0902   BUN 15 02/11/2021 0902   BUN 13 10/20/2020 1030   CREATININE 1.02 02/11/2021 0902   CREATININE 1.03 10/18/2016 1027      Component Value Date/Time   CALCIUM 8.9 02/11/2021 0902   ALKPHOS 94 02/11/2021 0902   AST 11 (L) 02/11/2021 0902   ALT 8 02/11/2021 0902   BILITOT 0.5 02/11/2021 0902       RADIOGRAPHIC STUDIES: DG Chest 2 View  Result Date: 01/23/2021 CLINICAL DATA:  Pleural effusion EXAM: CHEST - 2 VIEW COMPARISON:  01/14/2021 FINDINGS: Large right pleural effusion with increase from 7 days ago. Normal heart size where not obscured. Clear left lung with mild scarring or atelectasis at the base. IMPRESSION: Large right pleural effusion with increase from 7 days ago. Electronically Signed   By: Monte Fantasia M.D.   On: 01/23/2021 10:56   CT CHEST WO CONTRAST  Result Date: 01/14/2021 CLINICAL DATA:  Pleural effusion, status post thoracentesis, persistent cough EXAM: CT CHEST WITHOUT CONTRAST TECHNIQUE: Multidetector CT imaging of the chest was performed following the standard protocol without IV contrast. COMPARISON:  CT chest angiogram, 01/13/2021 FINDINGS: Cardiovascular: Aortic atherosclerosis. Normal heart size. Three-vessel coronary artery calcifications. No pericardial effusion. Mediastinum/Nodes: No  enlarged mediastinal, hilar, or axillary lymph nodes. Small hiatal hernia. Thyroid gland, trachea, and esophagus demonstrate no significant findings. Lungs/Pleura: Moderate right pleural effusion, diminished in volume compared to prior examination. There is paraseptal emphysema with sizable bulla. There is a lobulated nodule of the aerated portion of the superior segment right lower lobe measuring 1.4 x 0.9 cm (series 5, image 66). There appears to be some right-sided pleural thickening, particularly in the right aspect of the costophrenic recess (series 3, image 119). Upper Abdomen: No acute abnormality. Musculoskeletal: No chest wall mass or suspicious bone lesions identified. IMPRESSION: 1. Moderate right pleural effusion, diminished in volume compared to prior examination, in keeping with recent thoracentesis. 2. There is a lobulated nodule of the aerated portion of the superior segment right lower lobe measuring 1.4 x 0.9 cm. There appears to be some right-sided pleural thickening, particularly in the right aspect of the costophrenic recess. Findings are concerning for malignancy and associated malignant effusion. Correlate with thoracentesis cytopathology and consider PET-CT for metabolic characterization. 3. Paraseptal emphysema. 4. Coronary artery disease. Aortic Atherosclerosis (ICD10-I70.0) and Emphysema (ICD10-J43.9). Electronically Signed   By:  Eddie Candle M.D.   On: 01/14/2021 16:48   CT Angio Chest PE W and/or Wo Contrast  Result Date: 01/13/2021 CLINICAL DATA:  Pleural effusion shortness of breath EXAM: CT ANGIOGRAPHY CHEST WITH CONTRAST TECHNIQUE: Multidetector CT imaging of the chest was performed using the standard protocol during bolus administration of intravenous contrast. Multiplanar CT image reconstructions and MIPs were obtained to evaluate the vascular anatomy. CONTRAST:  64mL OMNIPAQUE IOHEXOL 350 MG/ML SOLN COMPARISON:  Chest x-ray 01/13/2021 FINDINGS: Cardiovascular: Satisfactory  opacification of the pulmonary arteries to the segmental level. No evidence of pulmonary embolism. Mild aortic atherosclerosis. No aneurysm. No dissection is seen. Coronary vascular calcification. Normal cardiac size. Trace pericardial effusion Mediastinum/Nodes: Midline trachea. No thyroid mass. No suspicious adenopathy. Esophagus within normal limits. Mediastinal shift to the left. Lungs/Pleura: Large right-sided pleural effusion with compressive atelectasis of the right lung. Underlying pulmonary emphysema with bulla and blebs. Narrowed appearance of the right and left main bronchi with possible fluid or debris in the right bronchus. Upper Abdomen: No acute abnormality. Cyst near the falciform ligament Musculoskeletal: No chest wall abnormality. No acute or significant osseous findings. Review of the MIP images confirms the above findings. IMPRESSION: 1. Negative for acute pulmonary embolus. 2. Large right-sided pleural effusion with compressive atelectasis of the right lung and mediastinal shift of contents to the left. Suspected fluid and or debris within the right bronchus with narrowed appearance of the right greater than left bronchi. 3. Pulmonary emphysema Aortic Atherosclerosis (ICD10-I70.0) and Emphysema (ICD10-J43.9). Electronically Signed   By: Donavan Foil M.D.   On: 01/13/2021 21:23   NM PET Image Initial (PI) Skull Base To Thigh  Result Date: 02/06/2021 CLINICAL DATA:  Initial treatment strategy for non-small cell lung cancer. EXAM: NUCLEAR MEDICINE PET SKULL BASE TO THIGH TECHNIQUE: 12.1 mCi F-18 FDG was injected intravenously. Full-ring PET imaging was performed from the skull base to thigh after the radiotracer. CT data was obtained and used for attenuation correction and anatomic localization. Fasting blood glucose: 101 mg/dl COMPARISON:  CT 01/14/2021 FINDINGS: Mediastinal blood pool activity: SUV max 2.3 Liver activity: SUV max NA NECK: No hypermetabolic lymph nodes in the neck. Incidental  CT findings: none CHEST: Rind of hypermetabolic pleural thickening within the RIGHT hemithorax. Example pleural thickening in the anterior RIGHT middle lobe measuring 10 mm with SUV max equal 9.0 (image 105). Hypermetabolic pleural thickening in the RIGHT upper lobe along the mediastinal pleural surface measures 10 mm with SUV max equal 6.6 on image 73 There is a large RIGHT effusion with passive atelectasis of the RIGHT lower lobe. No clear hypermetabolic primary lung within the lung parenchyma. There is a nodule in the superior segment of the RIGHT lower lobe with SUV max equal 3.6 image 80. Paraseptal emphysema the upper lobes. No hypermetabolic mediastinal adenopathy. No supraclavicular adenopathy. Activity beneath the carina is favored pleural activity in azygoesophageal recess. Incidental CT findings: none ABDOMEN/PELVIS: No abnormal hypermetabolic activity within the liver, pancreas, adrenal glands, or spleen. No hypermetabolic lymph nodes in the abdomen or pelvis. Incidental CT findings: Bladder is distended and the prostate is enlarged. SKELETON: No focal hypermetabolic activity to suggest skeletal metastasis. Incidental CT findings: none IMPRESSION: 1. Rind of hypermetabolic pleural thickening in the RIGHT hemithorax is consistent with pleural metastasis versus primary pleural malignancy. 2. Large RIGHT pleural effusion with passive atelectasis RIGHT lower lobe. 3. No clear bronchogenic carcinoma lesion within the lung parenchyma. Hypermetabolic nodule in the RIGHT lower lobe with mild activity. Favor metastasis. 4. No clear  hypermetabolic mediastinal adenopathy. No hypermetabolic supraclavicular adenopathy. 5. No evidence distant metastatic disease. 6. Distended bladder with enlarged prostate gland. Query bladder outlet obstruction. These results will be called to the ordering clinician or representative by the Radiologist Assistant, and communication documented in the PACS or [employer].  Electronically Signed   By: Suzy Bouchard M.D.   On: 02/06/2021 08:33   DG Chest Port 1 View  Result Date: 01/23/2021 CLINICAL DATA:  Post thoracentesis EXAM: PORTABLE CHEST 1 VIEW COMPARISON:  01/22/2019 FINDINGS: Two interval reduction in volume of the RIGHT pleural fluid following thoracentesis. No pneumothorax. LEFT lung clear. Stable large cardiac silhouette IMPRESSION: 1. No evidence of pneumothorax. 2. Reduction in RIGHT pleural fluid Electronically Signed   By: Suzy Bouchard M.D.   On: 01/23/2021 15:31   DG Chest Port 1 View  Result Date: 01/14/2021 CLINICAL DATA:  Status post right-sided thoracentesis EXAM: PORTABLE CHEST 1 VIEW COMPARISON:  Jan 13, 2021 FINDINGS: No pneumothorax. Right pleural effusion smaller after thoracentesis. There is a persistent moderate right pleural effusion with areas of atelectasis and potential consolidation in the right middle and lower lobes. The left lung is clear. Heart size and pulmonary vascularity are normal. No adenopathy. There is aortic atherosclerosis. There is degenerative change in each shoulder. IMPRESSION: No pneumothorax. Right pleural effusion smaller after thoracentesis on the right. There is residual moderate right pleural effusion with areas of atelectasis and potential consolidation in portions of the right middle and lower lobes. Left lung clear. Stable cardiac silhouette. Aortic Atherosclerosis (ICD10-I70.0). Electronically Signed   By: Lowella Grip III M.D.   On: 01/14/2021 15:18   DG Chest Portable 1 View  Result Date: 01/13/2021 CLINICAL DATA:  Shortness of breath started 3 weeks ago. EXAM: PORTABLE CHEST 1 VIEW COMPARISON:  CT of the chest on 10/12/2019 FINDINGS: There is near complete opacification of RIGHT hemithorax with small amount of aerated lung in the perihilar region. The significant opacity in the RIGHT hemithorax obscures the RIGHT heart margin. Heart is probably normal in size.  LEFT lung is clear. IMPRESSION: Near  complete opacification of the RIGHT hemithorax. Recommend further evaluation with CT of the chest. Electronically Signed   By: Nolon Nations M.D.   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 The patient had a PET scan performed recently.  I personally and independently reviewed the PET scan images and discussed the result and showed the images to the patient and his wife. I recommended for the patient to see cardiothoracic surgery for consideration of VATS and biopsy of the pleural-based hypermetabolic nodules/pleura and more drainage of the pleural fluid. I will arrange for the patient to come back for follow-up visit after the procedure for more detailed discussion of his treatment options based on the final pathology. The patient was advised to call immediately if he has any other concerning symptoms in the interval. The patient voices understanding of current disease status and treatment options and is in agreement with the current care plan.  All questions were answered. The patient knows to call the clinic with any problems, questions or concerns. We can certainly see the patient much sooner if necessary.  The total time spent in the appointment was 30 minutes.  Disclaimer: This note was dictated with voice recognition software. Similar sounding words can inadvertently  be transcribed and may not be corrected upon review.

## 2021-02-11 NOTE — Progress Notes (Signed)
Per Dr. Julien Nordmann, patient needs to see thoracic surgery. Will complete referral and update their team.

## 2021-02-12 ENCOUNTER — Ambulatory Visit: Payer: Medicare HMO | Admitting: Emergency Medicine

## 2021-02-12 ENCOUNTER — Encounter: Payer: Self-pay | Admitting: Emergency Medicine

## 2021-02-12 DIAGNOSIS — R9389 Abnormal findings on diagnostic imaging of other specified body structures: Secondary | ICD-10-CM

## 2021-02-12 DIAGNOSIS — J449 Chronic obstructive pulmonary disease, unspecified: Secondary | ICD-10-CM

## 2021-02-12 NOTE — Progress Notes (Signed)
Subjective:    Patient ID: Amanda Pote, male    DOB: 03-21-1951, 70 y.o.   MRN: [id number]  HPI 70 year old gentleman with a history of former tobacco use (40 pack years), hypertension, GERD, newly diagnosed COPD on Trelegy.  I saw him in the hospital last week when he was admitted for dyspnea and a large right pleural effusion.  We performed thoracentesis on 5/11, showed an exudate, cytology showed mitotic figures and atypical cells suggestive of mesothelial origin but not enough to definitively establish malignancy.  His shortness of breath did improve postthoracentesis.  Right lower lobe superior segmental nodule identified as below.  Presents now for further evaluation.  He reports his dyspnea is.  He remains on Trelegy, has albuterol which he uses  CT chest performed 01/14/2021 after thoracentesis reviewed by me, showed decrease in size of his large right pleural effusion, continued pleural fluid and right lower lobe atelectasis, right lower lobe superior segmental nodule 1.4 cm with some associated right-sided pleural thickening.  ROV 02/12/21 --Mr. Levada Dy is 70, former tobacco user with COPD on Trelegy.  He is undergone thoracentesis x2 for right pleural effusion that showed atypical cells and mitotic figures but was nondiagnostic for malignancy.  He has an associated right-sided pulmonary nodule in the superior segment.  PET scan performed 02/06/2021 reviewed, shows a rind of hypermetabolic pleural thickening in the right hemithorax, enlarged right pleural effusion with passive atelectasis of the right lower lobe, no clear hypermetabolic primary lung lesion.  His superior segmental right lower lobe nodule has an SUV max of 3.6.  No evidence of distant disease.   Review of Systems As per HPI  Past Medical History:  Diagnosis Date   Enlarged prostate    takes Terazosin daily   Gastroparesis    GERD (gastroesophageal reflux disease)    occasionally but no meds required   H/O  cardiovascular stress test 10/2019   nuclear, normal, Dr. Fransico Him   History of colon polyps    History of gastric ulcer    Joint pain    knees and ankles   Urinary frequency      Family History  Problem Relation Age of Onset   Dementia Mother    Heart disease Father 26       MI at 76yo, angina   Obesity Father    Cancer Neg Hx    Stroke Neg Hx    Diabetes Neg Hx      Social History   Socioeconomic History   Marital status: Married    Spouse name: Not on file   Number of children: Not on file   Years of education: Not on file   Highest education level: Not on file  Occupational History   Not on file  Tobacco Use   Smoking status: Former    Packs/day: 2.00    Years: 30.00    Pack years: 60.00    Types: Cigarettes    Quit date: 46    Years since quitting: 30.4   Smokeless tobacco: Never   Tobacco comments:    quit smoking around 1993  Substance and Sexual Activity   Alcohol use: Yes    Alcohol/week: 12.0 standard drinks    Types: 6 Glasses of wine, 6 Shots of liquor per week    Comment: none since gallbladder issues   Drug use: Yes    Types: Marijuana   Sexual activity: Yes  Other Topics Concern   Not on file  Social History Narrative  Not exercising.  Married.   Has 2 children, 2 grandchildren, one child [place], [place], one in [place], [place].   Prior worked as Pension scheme manager.  As of 10/2020   Social Determinants of Health   Financial Resource Strain: Not on file  Food Insecurity: Not on file  Transportation Needs: Not on file  Physical Activity: Not on file  Stress: Not on file  Social Connections: Not on file  Intimate Partner Violence: Not on file     No Known Allergies   Outpatient Medications Prior to Visit  Medication Sig Dispense Refill   albuterol (VENTOLIN HFA) 108 (90 Base) MCG/ACT inhaler TAKE 2 PUFFS BY MOUTH EVERY 6 HOURS AS NEEDED FOR WHEEZE OR SHORTNESS OF BREATH 18 each 0   aspirin 81 MG EC tablet Take 1 tablet by mouth  daily.     atorvastatin (LIPITOR) 80 MG tablet Take 1 tablet (80 mg total) by mouth daily. Please make overdue appt with Dr. Radford Pax before anymore refills. Thank you 1st attempt 30 tablet 0   cholecalciferol (VITAMIN D3) 25 MCG (1000 UNIT) tablet      Fluticasone-Umeclidin-Vilant (TRELEGY ELLIPTA) 100-62.5-25 MCG/INH AEPB Inhale 1 puff into the lungs daily. 28 each 5   glucosamine-chondroitin 500-400 MG tablet Take 1 tablet by mouth daily.     omeprazole (PRILOSEC) 20 MG capsule Take by mouth.     terazosin (HYTRIN) 10 MG capsule TAKE 1 CAPSULE BY MOUTH EVERY DAY 90 capsule 0   Turmeric (QC TUMERIC COMPLEX PO) Take 1,000 mg by mouth daily.     No facility-administered medications prior to visit.        Objective:   Physical Exam  Vitals:   02/12/21 1026  BP: 124/72  Pulse: 75  Temp: (!) 97.4 F (36.3 C)  TempSrc: Temporal  SpO2: 95%  Weight: 237 lb 6.4 oz (107.7 kg)  Height: 5\' 9"  (1.753 m)   Gen: Pleasant, well-nourished, in no distress,  normal affect  ENT: No lesions,  mouth clear,  oropharynx clear, no postnasal drip  Neck: No JVD, no stridor  Lungs: No use of accessory muscles, no crackles or wheezing on normal respiration, no wheeze on forced expiration  Cardiovascular: RRR, heart sounds normal, no murmur or gallops, no peripheral edema  Musculoskeletal: No deformities, no cyanosis or clubbing  Neuro: alert, awake, non focal  Skin: Warm, no lesions or rash     Assessment & Plan:  Abnormal CT of the chest Pleural fluid is been unrevealing twice.  He did have a right lower lobe nodule on prior imaging.  The PET scan that was done 02/06/2021 shows a hypermetabolic rind with some areas of focal pleural thickening but I do not see the right lower lobe nodule.  Not clear to me that navigation to the area will be beneficial especially given the associated right lower lobe atelectasis.  Agree with Dr. Julien Nordmann that VATS with pleural sampling is the highest yield test at this  point.  He has an appointment to see Dr. Koleen Nimrod coming up soon.  Agree with seeing Dr. Roxan Hockey with thoracic surgery to discuss a procedure to biopsy the lining around your right lung, look for either inflammation or evidence for lung cancer.  Follow with him as planned.  COPD (chronic obstructive pulmonary disease) (HCC) Continue your Trelegy 1 inhalation once daily. Keep albuterol available to use 2 puffs if needed for shortness of breath, chest tightness, wheezing. Follow Dr. Lamonte Sakai in 1 month or next available  45 minutes spent  coordinating care, arranging for thoracentesis, discussing plans, reviewing images with the patient  Baltazar Apo, MD, PhD 02/13/2021, 1:35 PM [facility] [phone] or if no answer before 7:00PM call [phone] For any issues after 7:00PM please call eLink [phone]

## 2021-02-12 NOTE — Patient Instructions (Signed)
Agree with seeing Dr. Roxan Hockey with thoracic surgery to discuss a procedure to biopsy the lining around your right lung, look for either inflammation or evidence for lung cancer.  Follow with him as planned. Continue your Trelegy 1 inhalation once daily. Keep albuterol available to use 2 puffs if needed for shortness of breath, chest tightness, wheezing. Follow Dr. Lamonte Sakai in 1 month or next available

## 2021-02-13 ENCOUNTER — Encounter: Payer: Self-pay | Admitting: Emergency Medicine

## 2021-02-13 NOTE — Assessment & Plan Note (Signed)
Continue your Trelegy 1 inhalation once daily. Keep albuterol available to use 2 puffs if needed for shortness of breath, chest tightness, wheezing. Follow Dr. Lamonte Sakai in 1 month or next available

## 2021-02-13 NOTE — Assessment & Plan Note (Signed)
Pleural fluid is been unrevealing twice.  He did have a right lower lobe nodule on prior imaging.  The PET scan that was done 02/06/2021 shows a hypermetabolic rind with some areas of focal pleural thickening but I do not see the right lower lobe nodule.  Not clear to me that navigation to the area will be beneficial especially given the associated right lower lobe atelectasis.  Agree with Dr. Julien Nordmann that VATS with pleural sampling is the highest yield test at this point.  He has an appointment to see Dr. Koleen Nimrod coming up soon.  Agree with seeing Dr. Roxan Hockey with thoracic surgery to discuss a procedure to biopsy the lining around your right lung, look for either inflammation or evidence for lung cancer.  Follow with him as planned.

## 2021-02-15 DIAGNOSIS — R0602 Shortness of breath: Secondary | ICD-10-CM | POA: Diagnosis not present

## 2021-02-15 DIAGNOSIS — J9601 Acute respiratory failure with hypoxia: Secondary | ICD-10-CM | POA: Diagnosis not present

## 2021-02-15 DIAGNOSIS — R918 Other nonspecific abnormal finding of lung field: Secondary | ICD-10-CM | POA: Diagnosis not present

## 2021-02-20 ENCOUNTER — Institutional Professional Consult (permissible substitution): Payer: Medicare HMO | Admitting: Thoracic Surgery (Cardiothoracic Vascular Surgery)

## 2021-02-20 ENCOUNTER — Other Ambulatory Visit: Payer: Self-pay

## 2021-02-20 ENCOUNTER — Other Ambulatory Visit: Payer: Self-pay | Admitting: Cardiology

## 2021-02-20 VITALS — BP 102/64 | HR 78 | Resp 20 | Ht 68.0 in | Wt 237.0 lb

## 2021-02-20 DIAGNOSIS — J90 Pleural effusion, not elsewhere classified: Secondary | ICD-10-CM | POA: Diagnosis not present

## 2021-02-20 NOTE — Progress Notes (Signed)
PCP is Tysinger, Camelia Eng, PA-C Referring Provider is Curt Bears, MD  Chief Complaint  Patient presents with   Pleural Effusion    Surgical consult, THORACENTESIS 01/14/21 and 01/23/21    HPI: [name] is sent for consultation regarding a right pleural effusion.  Guy Evans is a 70 year old man with a history of tobacco abuse, reflux, gastroparesis, degenerative joint disease, and BPH.  He has a 60-pack-year history of smoking prior to quitting in 1992.  He complains of shortness of breath with exertion.  This started earlier in the year but only got significant in early May.  He saw Dr. Glade Lloyd.  They tried inhalers but it did not help much.  That led to chest x-ray which showed a large right pleural effusion.  He had a thoracentesis in the cytology showed atypical cells suspicious for but not diagnostic of mesothelioma.  He then had a second thoracentesis with similar findings.  He did have symptomatic relief with drainage of the fluid.  CT of the chest showed some pleural nodularity and a nodule in the superior segment of the right lower lobe.  A PET/CT showed hypermetabolic pleural nodularity with no definite mediastinal adenopathy.  He denies any significant weight loss, cough, wheezing, chest pain.  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  Zubrod Score: At the time of surgery this patient's most appropriate activity status/level should be described as: []     0    Normal activity, no symptoms [x]     1    Restricted in physical strenuous activity but ambulatory, able to do out light work []     2    Ambulatory and capable of self care, unable to do work activities, up and about >50 % of waking hours                              []     3    Only limited self care, in bed greater than 50% of waking hours []     4    Completely disabled, no self care, confined to bed  or chair []     5    Moribund   Past Medical History:  Diagnosis Date   Enlarged prostate    takes Terazosin daily   Gastroparesis    GERD (gastroesophageal reflux disease)    occasionally but no meds required   H/O cardiovascular stress test 10/2019   nuclear, normal, Dr. Fransico Him   History of colon polyps    History of gastric ulcer    Joint pain    knees and ankles   Urinary frequency     Past Surgical History:  Procedure Laterality Date   CHOLECYSTECTOMY N/A 11/19/2013   Procedure: LAPAROSCOPIC CHOLECYSTECTOMY;  Surgeon: Harl Bowie, MD;  Location: [place];  Service: General;  Laterality: N/A;   COLONOSCOPY     ESOPHAGOGASTRODUODENOSCOPY     skin spots removed and tested     done in the office   THORACENTESIS N/A 01/14/2021   Procedure: THORACENTESIS;  Surgeon: Collene Gobble, MD;  Location: [place];  Service: Cardiopulmonary;  Laterality: N/A;   THORACENTESIS N/A 01/23/2021   Procedure: THORACENTESIS;  Surgeon: Collene Gobble, MD;  Location: [facility] ENDOSCOPY;  Service: Cardiopulmonary;  Laterality: N/A;    Family History  Problem Relation Age of Onset  Dementia Mother    Heart disease Father 61       MI at 56yo, angina   Obesity Father    Cancer Neg Hx    Stroke Neg Hx    Diabetes Neg Hx     Social History Social History   Tobacco Use   Smoking status: Former    Packs/day: 2.00    Years: 30.00    Pack years: 60.00    Types: Cigarettes    Quit date: 1992    Years since quitting: 30.4   Smokeless tobacco: Never   Tobacco comments:    quit smoking around 1993  Substance Use Topics   Alcohol use: Yes    Alcohol/week: 12.0 standard drinks    Types: 6 Glasses of wine, 6 Shots of liquor per week    Comment: none since gallbladder issues   Drug use: Yes    Types: Marijuana    Current Outpatient Medications  Medication Sig Dispense Refill   albuterol (VENTOLIN HFA) 108 (90 Base) MCG/ACT inhaler TAKE 2 PUFFS BY MOUTH EVERY 6 HOURS AS NEEDED  FOR WHEEZE OR SHORTNESS OF BREATH 18 each 0   aspirin 81 MG EC tablet Take 1 tablet by mouth daily.     atorvastatin (LIPITOR) 80 MG tablet TAKE 1 TABLET BY MOUTH EVERY DAY 30 tablet 0   cholecalciferol (VITAMIN D3) 25 MCG (1000 UNIT) tablet      Fluticasone-Umeclidin-Vilant (TRELEGY ELLIPTA) 100-62.5-25 MCG/INH AEPB Inhale 1 puff into the lungs daily. 28 each 5   glucosamine-chondroitin 500-400 MG tablet Take 1 tablet by mouth daily.     omeprazole (PRILOSEC) 20 MG capsule Take by mouth.     terazosin (HYTRIN) 10 MG capsule TAKE 1 CAPSULE BY MOUTH EVERY DAY 90 capsule 0   Turmeric (QC TUMERIC COMPLEX PO) Take 1,000 mg by mouth daily.     No current facility-administered medications for this visit.    No Known Allergies  Review of Systems  Constitutional:  Positive for activity change and fatigue. Negative for unexpected weight change.  Respiratory:  Positive for shortness of breath. Negative for cough, chest tightness and wheezing.   Cardiovascular:  Positive for leg swelling. Negative for chest pain.  Genitourinary:  Positive for difficulty urinating and frequency.  Neurological:  Negative for seizures and weakness.   There were no vitals taken for this visit. Physical Exam Vitals reviewed.  Constitutional:      General: He is not in acute distress.    Appearance: Normal appearance.  HENT:     Head: Normocephalic and atraumatic.  Cardiovascular:     Rate and Rhythm: Normal rate and regular rhythm.     Heart sounds: Normal heart sounds. No murmur heard.   No friction rub. No gallop.  Pulmonary:     Effort: Pulmonary effort is normal. No respiratory distress.     Breath sounds: No wheezing or rales.     Comments: Diminished breath sounds right base Abdominal:     General: There is no distension.     Palpations: Abdomen is soft.  Musculoskeletal:     Cervical back: Neck supple.  Lymphadenopathy:     Cervical: No cervical adenopathy.  Skin:    General: Skin is warm and  dry.  Neurological:     General: No focal deficit present.     Mental Status: He is alert and oriented to person, place, and time.     Cranial Nerves: No cranial nerve deficit.     Motor: No weakness.  Diagnostic Tests: NUCLEAR MEDICINE PET SKULL BASE TO THIGH   TECHNIQUE: 12.1 mCi F-18 FDG was injected intravenously. Full-ring PET imaging was performed from the skull base to thigh after the radiotracer. CT data was obtained and used for attenuation correction and anatomic localization.   Fasting blood glucose: 101 mg/dl   COMPARISON:  CT 01/14/2021   FINDINGS: Mediastinal blood pool activity: SUV max 2.3   Liver activity: SUV max NA   NECK: No hypermetabolic lymph nodes in the neck.   Incidental CT findings: none   CHEST: Rind of hypermetabolic pleural thickening within the RIGHT hemithorax. Example pleural thickening in the anterior RIGHT middle lobe measuring 10 mm with SUV max equal 9.0 (image 105). Hypermetabolic pleural thickening in the RIGHT upper lobe along the mediastinal pleural surface measures 10 mm with SUV max equal 6.6 on image 73   There is a large RIGHT effusion with passive atelectasis of the RIGHT lower lobe. No clear hypermetabolic primary lung within the lung parenchyma. There is a nodule in the superior segment of the RIGHT lower lobe with SUV max equal 3.6 image 80.   Paraseptal emphysema the upper lobes.   No hypermetabolic mediastinal adenopathy. No supraclavicular adenopathy. Activity beneath the carina is favored pleural activity in azygoesophageal recess.   Incidental CT findings: none   ABDOMEN/PELVIS: No abnormal hypermetabolic activity within the liver, pancreas, adrenal glands, or spleen. No hypermetabolic lymph nodes in the abdomen or pelvis.   Incidental CT findings: Bladder is distended and the prostate is enlarged.   SKELETON: No focal hypermetabolic activity to suggest skeletal metastasis.   Incidental CT findings:  none   IMPRESSION: 1. Rind of hypermetabolic pleural thickening in the RIGHT hemithorax is consistent with pleural metastasis versus primary pleural malignancy. 2. Large RIGHT pleural effusion with passive atelectasis RIGHT lower lobe. 3. No clear bronchogenic carcinoma lesion within the lung parenchyma. Hypermetabolic nodule in the RIGHT lower lobe with mild activity. Favor metastasis. 4. No clear hypermetabolic mediastinal adenopathy. No hypermetabolic supraclavicular adenopathy. 5. No evidence distant metastatic disease. 6. Distended bladder with enlarged prostate gland. Query bladder outlet obstruction.   These results will be called to the ordering clinician or representative by the Radiologist Assistant, and communication documented in the PACS or [employer].     Electronically Signed   By: Suzy Bouchard M.D.   On: 02/06/2021 08:33 I personally reviewed the chest x-ray, CT, and PET/CT images.  Presented initially with a large right pleural effusion.  Improved after thoracentesis.  Nodularity to pleural surface that is hypermetabolic on PET/CT.  Impression: Guy Evans is a 65 year old man with a history of tobacco abuse, reflux, gastroparesis, degenerative joint disease, and BPH.  He presents with a history of shortness of breath.  Work-up revealed a large right pleural effusion.  Thoracentesis x2 drained almost 4 L of fluid in total.  There were atypical cells on cytology suspicious for mesothelioma but not definitive.  PET/CT shows some residual effusion with some pleural nodularity that is hypermetabolic.  Differential diagnosis includes mesothelioma, adenocarcinoma, as well as infectious and inflammatory conditions (empyema, rheumatoid, etc.)  We discussed the need to accomplish 2 goals.  The versus to establish a definitive diagnosis so that appropriate treatment can be given.  The second is to prevent the effusion from returning.  I recommended that we proceed  with right VATS for pleural biopsy, drainage of the effusion, and then either talc pleurodesis or pleural catheter placement depending on intraoperative findings.  If he has mesothelioma to  pleurodesis would be a better option.  For most everything else a pleural catheter with be sufficient.  I discussed the general nature of the procedure with him.  He understands this is an operation and will be done under general anesthesia using a minimally invasive approach, use of drainage tubes postoperatively, the expected hospital stay, and the overall recovery.  He understands intraoperative decision-making on top pleurodesis versus pleural catheter placement.  I informed him of the indications, risks, benefits, and alternatives.  He understands the risk include, but not limited to death, MI, DVT, PE, bleeding, possible need for transfusion, infection, air leak, cardiac arrhythmias, as well as possibility of other unforeseeable complications.  He accepts the risk and agrees to proceed.  Plan: Right VATS for pleural biopsy, possible talc pleurodesis versus possible pleural catheter placement on Friday, 02/27/2021  Melrose Nakayama, MD [facility] [phone]

## 2021-02-20 NOTE — H&P (View-Only) (Signed)
PCP is Tysinger, Camelia Eng, PA-C Referring Provider is Curt Bears, MD  Chief Complaint  Patient presents with   Pleural Effusion    Surgical consult, THORACENTESIS 01/14/21 and 01/23/21    HPI: Guy Evans is sent for consultation regarding a right pleural effusion.  Mcguire Gasparyan is a 70 year old man with a history of tobacco abuse, reflux, gastroparesis, degenerative joint disease, and BPH.  He has a 60-pack-year history of smoking prior to quitting in 1992.  He complains of shortness of breath with exertion.  This started earlier in the year but only got significant in early May.  He saw Dr. Glade Lloyd.  They tried inhalers but it did not help much.  That led to chest x-ray which showed a large right pleural effusion.  He had a thoracentesis in the cytology showed atypical cells suspicious for but not diagnostic of mesothelioma.  He then had a second thoracentesis with similar findings.  He did have symptomatic relief with drainage of the fluid.  CT of the chest showed some pleural nodularity and a nodule in the superior segment of the right lower lobe.  A PET/CT showed hypermetabolic pleural nodularity with no definite mediastinal adenopathy.  He denies any significant weight loss, cough, wheezing, chest pain.  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  Zubrod Score: At the time of surgery this patient's most appropriate activity status/level should be described as: []     0    Normal activity, no symptoms [x]     1    Restricted in physical strenuous activity but ambulatory, able to do out light work []     2    Ambulatory and capable of self care, unable to do work activities, up and about >50 % of waking hours                              []     3    Only limited self care, in bed greater than 50% of waking hours []     4    Completely disabled, no self care, confined to bed  or chair []     5    Moribund   Past Medical History:  Diagnosis Date   Enlarged prostate    takes Terazosin daily   Gastroparesis    GERD (gastroesophageal reflux disease)    occasionally but no meds required   H/O cardiovascular stress test 10/2019   nuclear, normal, Dr. Fransico Him   History of colon polyps    History of gastric ulcer    Joint pain    knees and ankles   Urinary frequency     Past Surgical History:  Procedure Laterality Date   CHOLECYSTECTOMY N/A 11/19/2013   Procedure: LAPAROSCOPIC CHOLECYSTECTOMY;  Surgeon: Harl Bowie, MD;  Location: [place];  Service: General;  Laterality: N/A;   COLONOSCOPY     ESOPHAGOGASTRODUODENOSCOPY     skin spots removed and tested     done in the office   THORACENTESIS N/A 01/14/2021   Procedure: THORACENTESIS;  Surgeon: Collene Gobble, MD;  Location[facility];  Service: Cardiopulmonary;  Laterality: N/A;   THORACENTESIS N/A 01/23/2021   Procedure: THORACENTESIS;  Surgeon: Collene Gobble, MD;  Location: [facility] ENDOSCOPY;  Service: Cardiopulmonary;  Laterality: N/A;    Family History  Problem Relation Age of Onset  Dementia Mother    Heart disease Father 38       MI at 80yo, angina   Obesity Father    Cancer Neg Hx    Stroke Neg Hx    Diabetes Neg Hx     Social History Social History   Tobacco Use   Smoking status: Former    Packs/day: 2.00    Years: 30.00    Pack years: 60.00    Types: Cigarettes    Quit date: 1992    Years since quitting: 30.4   Smokeless tobacco: Never   Tobacco comments:    quit smoking around 1993  Substance Use Topics   Alcohol use: Yes    Alcohol/week: 12.0 standard drinks    Types: 6 Glasses of wine, 6 Shots of liquor per week    Comment: none since gallbladder issues   Drug use: Yes    Types: Marijuana    Current Outpatient Medications  Medication Sig Dispense Refill   albuterol (VENTOLIN HFA) 108 (90 Base) MCG/ACT inhaler TAKE 2 PUFFS BY MOUTH EVERY 6 HOURS AS NEEDED  FOR WHEEZE OR SHORTNESS OF BREATH 18 each 0   aspirin 81 MG EC tablet Take 1 tablet by mouth daily.     atorvastatin (LIPITOR) 80 MG tablet TAKE 1 TABLET BY MOUTH EVERY DAY 30 tablet 0   cholecalciferol (VITAMIN D3) 25 MCG (1000 UNIT) tablet      Fluticasone-Umeclidin-Vilant (TRELEGY ELLIPTA) 100-62.5-25 MCG/INH AEPB Inhale 1 puff into the lungs daily. 28 each 5   glucosamine-chondroitin 500-400 MG tablet Take 1 tablet by mouth daily.     omeprazole (PRILOSEC) 20 MG capsule Take by mouth.     terazosin (HYTRIN) 10 MG capsule TAKE 1 CAPSULE BY MOUTH EVERY DAY 90 capsule 0   Turmeric (QC TUMERIC COMPLEX PO) Take 1,000 mg by mouth daily.     No current facility-administered medications for this visit.    No Known Allergies  Review of Systems  Constitutional:  Positive for activity change and fatigue. Negative for unexpected weight change.  Respiratory:  Positive for shortness of breath. Negative for cough, chest tightness and wheezing.   Cardiovascular:  Positive for leg swelling. Negative for chest pain.  Genitourinary:  Positive for difficulty urinating and frequency.  Neurological:  Negative for seizures and weakness.   There were no vitals taken for this visit. Physical Exam Vitals reviewed.  Constitutional:      General: He is not in acute distress.    Appearance: Normal appearance.  HENT:     Head: Normocephalic and atraumatic.  Cardiovascular:     Rate and Rhythm: Normal rate and regular rhythm.     Heart sounds: Normal heart sounds. No murmur heard.   No friction rub. No gallop.  Pulmonary:     Effort: Pulmonary effort is normal. No respiratory distress.     Breath sounds: No wheezing or rales.     Comments: Diminished breath sounds right base Abdominal:     General: There is no distension.     Palpations: Abdomen is soft.  Musculoskeletal:     Cervical back: Neck supple.  Lymphadenopathy:     Cervical: No cervical adenopathy.  Skin:    General: Skin is warm and  dry.  Neurological:     General: No focal deficit present.     Mental Status: He is alert and oriented to person, place, and time.     Cranial Nerves: No cranial nerve deficit.     Motor: No weakness.  Diagnostic Tests: NUCLEAR MEDICINE PET SKULL BASE TO THIGH   TECHNIQUE: 12.1 mCi F-18 FDG was injected intravenously. Full-ring PET imaging was performed from the skull base to thigh after the radiotracer. CT data was obtained and used for attenuation correction and anatomic localization.   Fasting blood glucose: 101 mg/dl   COMPARISON:  CT 01/14/2021   FINDINGS: Mediastinal blood pool activity: SUV max 2.3   Liver activity: SUV max NA   NECK: No hypermetabolic lymph nodes in the neck.   Incidental CT findings: none   CHEST: Rind of hypermetabolic pleural thickening within the RIGHT hemithorax. Example pleural thickening in the anterior RIGHT middle lobe measuring 10 mm with SUV max equal 9.0 (image 105). Hypermetabolic pleural thickening in the RIGHT upper lobe along the mediastinal pleural surface measures 10 mm with SUV max equal 6.6 on image 73   There is a large RIGHT effusion with passive atelectasis of the RIGHT lower lobe. No clear hypermetabolic primary lung within the lung parenchyma. There is a nodule in the superior segment of the RIGHT lower lobe with SUV max equal 3.6 image 80.   Paraseptal emphysema the upper lobes.   No hypermetabolic mediastinal adenopathy. No supraclavicular adenopathy. Activity beneath the carina is favored pleural activity in azygoesophageal recess.   Incidental CT findings: none   ABDOMEN/PELVIS: No abnormal hypermetabolic activity within the liver, pancreas, adrenal glands, or spleen. No hypermetabolic lymph nodes in the abdomen or pelvis.   Incidental CT findings: Bladder is distended and the prostate is enlarged.   SKELETON: No focal hypermetabolic activity to suggest skeletal metastasis.   Incidental CT findings:  none   IMPRESSION: 1. Rind of hypermetabolic pleural thickening in the RIGHT hemithorax is consistent with pleural metastasis versus primary pleural malignancy. 2. Large RIGHT pleural effusion with passive atelectasis RIGHT lower lobe. 3. No clear bronchogenic carcinoma lesion within the lung parenchyma. Hypermetabolic nodule in the RIGHT lower lobe with mild activity. Favor metastasis. 4. No clear hypermetabolic mediastinal adenopathy. No hypermetabolic supraclavicular adenopathy. 5. No evidence distant metastatic disease. 6. Distended bladder with enlarged prostate gland. Query bladder outlet obstruction.   These results will be called to the ordering clinician or representative by the Radiologist Assistant, and communication documented in the PACS or [employer].     Electronically Signed   By: Suzy Bouchard M.D.   On: 02/06/2021 08:33 I personally reviewed the chest x-ray, CT, and PET/CT images.  Presented initially with a large right pleural effusion.  Improved after thoracentesis.  Nodularity to pleural surface that is hypermetabolic on PET/CT.  Impression: Guy Evans is a 31 year old man with a history of tobacco abuse, reflux, gastroparesis, degenerative joint disease, and BPH.  He presents with a history of shortness of breath.  Work-up revealed a large right pleural effusion.  Thoracentesis x2 drained almost 4 L of fluid in total.  There were atypical cells on cytology suspicious for mesothelioma but not definitive.  PET/CT shows some residual effusion with some pleural nodularity that is hypermetabolic.  Differential diagnosis includes mesothelioma, adenocarcinoma, as well as infectious and inflammatory conditions (empyema, rheumatoid, etc.)  We discussed the need to accomplish 2 goals.  The versus to establish a definitive diagnosis so that appropriate treatment can be given.  The second is to prevent the effusion from returning.  I recommended that we proceed  with right VATS for pleural biopsy, drainage of the effusion, and then either talc pleurodesis or pleural catheter placement depending on intraoperative findings.  If he has mesothelioma to  pleurodesis would be a better option.  For most everything else a pleural catheter with be sufficient.  I discussed the general nature of the procedure with him.  He understands this is an operation and will be done under general anesthesia using a minimally invasive approach, use of drainage tubes postoperatively, the expected hospital stay, and the overall recovery.  He understands intraoperative decision-making on top pleurodesis versus pleural catheter placement.  I informed him of the indications, risks, benefits, and alternatives.  He understands the risk include, but not limited to death, MI, DVT, PE, bleeding, possible need for transfusion, infection, air leak, cardiac arrhythmias, as well as possibility of other unforeseeable complications.  He accepts the risk and agrees to proceed.  Plan: Right VATS for pleural biopsy, possible talc pleurodesis versus possible pleural catheter placement on Friday, 02/27/2021  Melrose Nakayama, MD [facility] [phone]

## 2021-02-23 ENCOUNTER — Other Ambulatory Visit: Payer: Self-pay | Admitting: *Deleted

## 2021-02-23 ENCOUNTER — Encounter: Payer: Self-pay | Admitting: *Deleted

## 2021-02-23 DIAGNOSIS — R222 Localized swelling, mass and lump, trunk: Secondary | ICD-10-CM

## 2021-02-23 DIAGNOSIS — J90 Pleural effusion, not elsewhere classified: Secondary | ICD-10-CM

## 2021-02-23 NOTE — Progress Notes (Signed)
I followed up on Guy Evans appt notes from 6/17 with Dr. Roxan Hockey.  Treatment plan is for him to have VATS with talc pleurodesis.  This has not been scheduled yet and I will continue to follow up on date of surgery.

## 2021-02-24 NOTE — Pre-Procedure Instructions (Signed)
Surgical Instructions:    Your procedure is scheduled on Friday, June 24th (12:48 PM- 3:48 PM).  Report to [facility] Main Entrance "A" at 10:45 A.M., then check in with the Admitting office.  Call this number if you have any questions prior to your surgery date, or have problems the morning of surgery:  [phone]    Remember:  Do not eat or drink after midnight the night before your surgery.     Take these medicines the morning of surgery with A SIP OF WATER:  atorvastatin (LIPITOR) omeprazole (PRILOSEC)  terazosin (HYTRIN) Fluticasone-Umeclidin-Vilant (TRELEGY ELLIPTA) inhaler   IF NEEDED: albuterol (VENTOLIN HFA) inhaler Please bring all inhalers with you the day of surgery.     As of today, STOP taking any Aspirin (unless otherwise instructed by your surgeon) Aleve, Naproxen, Ibuprofen, Motrin, Advil, Goody's, BC's, all herbal medications, fish oil, and all vitamins.             Special instructions:    [place]- Preparing For Surgery  Before surgery, you can play an important role. Because skin is not sterile, your skin needs to be as free of germs as possible. You can reduce the number of germs on your skin by washing with CHG (chlorahexidine gluconate) Soap before surgery.  CHG is an antiseptic cleaner which kills germs and bonds with the skin to continue killing germs even after washing.     Please do not use if you have an allergy to CHG or antibacterial soaps. If your skin becomes reddened/irritated stop using the CHG.  Do not shave (including legs and underarms) for at least 48 hours prior to first CHG shower. It is OK to shave your face.  Please follow these instructions carefully.     Shower the NIGHT BEFORE SURGERY and the MORNING OF SURGERY with CHG Soap.   If you chose to wash your hair, wash your hair first as usual with your normal shampoo. After you shampoo, rinse your hair and body thoroughly to remove the shampoo.  Then [employer] and genitals  (private parts) with your normal soap and rinse thoroughly to remove soap.  After that Use CHG Soap as you would any other liquid soap. You can apply CHG directly to the skin and wash gently with a scrungie or a clean washcloth.   Apply the CHG Soap to your body ONLY FROM THE NECK DOWN.  Do not use on open wounds or open sores. Avoid contact with your eyes, ears, mouth and genitals (private parts). Wash Face and genitals (private parts)  with your normal soap.   Wash thoroughly, paying special attention to the area where your surgery will be performed.  Thoroughly rinse your body with warm water from the neck down.  DO NOT shower/wash with your normal soap after using and rinsing off the CHG Soap.  Pat yourself dry with a CLEAN TOWEL.  Wear CLEAN PAJAMAS to bed the night before surgery  Place CLEAN SHEETS on your bed the night before your surgery  DO NOT SLEEP WITH PETS.   Day of Surgery:  Take a shower with CHG soap. Wear Clean/Comfortable clothing the morning of surgery Do not wear lotions, powders, colognes, or deodorant.   Remember to brush your teeth WITH YOUR REGULAR TOOTHPASTE. Do not wear jewelry. Men may shave face and neck. Do not bring valuables to the hospital. [facility] is not responsible for any belongings or valuables.  Do NOT Smoke (Tobacco/Vaping) or drink Alcohol 24 hours prior to  your procedure.  If you use a CPAP at night, you may bring all equipment for your overnight stay.   Contacts, glasses, dentures or bridgework may not be worn into surgery, please bring cases for these belongings.   For patients admitted to the hospital, discharge time will be determined by your treatment team.  Patients discharged the day of surgery will not be allowed to drive home, and someone needs to stay with them for 24 hours.  ONLY 1 SUPPORT PERSON MAY BE PRESENT WHILE YOU ARE IN SURGERY. IF YOU ARE TO BE ADMITTED ONCE YOU ARE IN YOUR ROOM YOU WILL BE ALLOWED TWO (2)  VISITORS.  Minor children may have two parents present. Special consideration for safety and communication needs will be reviewed on a case by case basis.     Please read over the following fact sheets that you were given.

## 2021-02-25 ENCOUNTER — Other Ambulatory Visit: Payer: Self-pay

## 2021-02-25 ENCOUNTER — Ambulatory Visit (HOSPITAL_COMMUNITY)
Admission: RE | Admit: 2021-02-25 | Discharge: 2021-02-25 | Disposition: A | Discharge status: Home / Self Care | Payer: Medicare HMO | Source: Ambulatory Visit | Attending: Thoracic Surgery (Cardiothoracic Vascular Surgery) | Admitting: Thoracic Surgery (Cardiothoracic Vascular Surgery)

## 2021-02-25 ENCOUNTER — Encounter (HOSPITAL_COMMUNITY)
Admission: RE | Admit: 2021-02-25 | Discharge: 2021-02-25 | Disposition: A | Discharge status: Home / Self Care | Payer: Medicare HMO | Source: Ambulatory Visit | Attending: Thoracic Surgery (Cardiothoracic Vascular Surgery) | Admitting: Thoracic Surgery (Cardiothoracic Vascular Surgery)

## 2021-02-25 ENCOUNTER — Encounter (HOSPITAL_COMMUNITY): Payer: Self-pay

## 2021-02-25 DIAGNOSIS — J90 Pleural effusion, not elsewhere classified: Secondary | ICD-10-CM | POA: Insufficient documentation

## 2021-02-25 DIAGNOSIS — R222 Localized swelling, mass and lump, trunk: Secondary | ICD-10-CM

## 2021-02-25 DIAGNOSIS — M1990 Unspecified osteoarthritis, unspecified site: Secondary | ICD-10-CM | POA: Diagnosis not present

## 2021-02-25 DIAGNOSIS — I700 Atherosclerosis of aorta: Secondary | ICD-10-CM | POA: Diagnosis not present

## 2021-02-25 DIAGNOSIS — R0989 Other specified symptoms and signs involving the circulatory and respiratory systems: Secondary | ICD-10-CM | POA: Insufficient documentation

## 2021-02-25 DIAGNOSIS — Z01818 Encounter for other preprocedural examination: Secondary | ICD-10-CM | POA: Insufficient documentation

## 2021-02-25 DIAGNOSIS — R918 Other nonspecific abnormal finding of lung field: Secondary | ICD-10-CM | POA: Diagnosis not present

## 2021-02-25 DIAGNOSIS — I517 Cardiomegaly: Secondary | ICD-10-CM | POA: Insufficient documentation

## 2021-02-25 DIAGNOSIS — J910 Malignant pleural effusion: Secondary | ICD-10-CM | POA: Diagnosis not present

## 2021-02-25 DIAGNOSIS — J9811 Atelectasis: Secondary | ICD-10-CM | POA: Diagnosis not present

## 2021-02-25 DIAGNOSIS — K219 Gastro-esophageal reflux disease without esophagitis: Secondary | ICD-10-CM | POA: Diagnosis not present

## 2021-02-25 DIAGNOSIS — C450 Mesothelioma of pleura: Secondary | ICD-10-CM | POA: Diagnosis not present

## 2021-02-25 DIAGNOSIS — K3184 Gastroparesis: Secondary | ICD-10-CM | POA: Diagnosis not present

## 2021-02-25 DIAGNOSIS — Z20822 Contact with and (suspected) exposure to covid-19: Secondary | ICD-10-CM | POA: Insufficient documentation

## 2021-02-25 DIAGNOSIS — I2510 Atherosclerotic heart disease of native coronary artery without angina pectoris: Secondary | ICD-10-CM | POA: Diagnosis not present

## 2021-02-25 DIAGNOSIS — J438 Other emphysema: Secondary | ICD-10-CM | POA: Diagnosis not present

## 2021-02-25 DIAGNOSIS — J811 Chronic pulmonary edema: Secondary | ICD-10-CM | POA: Diagnosis not present

## 2021-02-25 HISTORY — DX: Unspecified osteoarthritis, unspecified site: M19.90

## 2021-02-25 LAB — BLOOD GAS, ARTERIAL
Acid-Base Excess: 2.8 mmol/L — ABNORMAL HIGH (ref 0.0–2.0)
Bicarbonate: 26.5 mmol/L (ref 20.0–28.0)
Drawn by: 602861
FIO2: 21
O2 Saturation: 94.5 %
Patient temperature: 37
pCO2 arterial: 38.8 mmHg (ref 32.0–48.0)
pH, Arterial: 7.45 (ref 7.350–7.450)
pO2, Arterial: 70.7 mmHg — ABNORMAL LOW (ref 83.0–108.0)

## 2021-02-25 LAB — URINALYSIS, ROUTINE W REFLEX MICROSCOPIC
Bilirubin Urine: NEGATIVE
Glucose, UA: NEGATIVE mg/dL
Hgb urine dipstick: NEGATIVE
Ketones, ur: NEGATIVE mg/dL
Leukocytes,Ua: NEGATIVE
Nitrite: NEGATIVE
Protein, ur: NEGATIVE mg/dL
Specific Gravity, Urine: 1.015 (ref 1.005–1.030)
pH: 6 (ref 5.0–8.0)

## 2021-02-25 LAB — TYPE AND SCREEN
ABO/RH(D): O NEG
Antibody Screen: NEGATIVE

## 2021-02-25 LAB — SURGICAL PCR SCREEN
MRSA, PCR: NEGATIVE
Staphylococcus aureus: NEGATIVE

## 2021-02-25 LAB — CBC
HCT: 38.9 % — ABNORMAL LOW (ref 39.0–52.0)
Hemoglobin: 12.9 g/dL — ABNORMAL LOW (ref 13.0–17.0)
MCH: 30.3 pg (ref 26.0–34.0)
MCHC: 33.2 g/dL (ref 30.0–36.0)
MCV: 91.3 fL (ref 80.0–100.0)
Platelets: 227 10*3/uL (ref 150–400)
RBC: 4.26 MIL/uL (ref 4.22–5.81)
RDW: 13.2 % (ref 11.5–15.5)
WBC: 6.4 10*3/uL (ref 4.0–10.5)
nRBC: 0 % (ref 0.0–0.2)

## 2021-02-25 LAB — COMPREHENSIVE METABOLIC PANEL
ALT: 15 U/L (ref 0–44)
AST: 18 U/L (ref 15–41)
Albumin: 3.5 g/dL (ref 3.5–5.0)
Alkaline Phosphatase: 91 U/L (ref 38–126)
Anion gap: 10 (ref 5–15)
BUN: 11 mg/dL (ref 8–23)
CO2: 23 mmol/L (ref 22–32)
Calcium: 9 mg/dL (ref 8.9–10.3)
Chloride: 104 mmol/L (ref 98–111)
Creatinine, Ser: 0.85 mg/dL (ref 0.61–1.24)
GFR, Estimated: >60 mL/min (ref >60)
Glucose, Bld: 120 mg/dL — ABNORMAL HIGH (ref 70–99)
Potassium: 3.7 mmol/L (ref 3.5–5.1)
Sodium: 137 mmol/L (ref 135–145)
Total Bilirubin: 0.7 mg/dL (ref 0.3–1.2)
Total Protein: 6.5 g/dL (ref 6.5–8.1)

## 2021-02-25 LAB — APTT: aPTT: 27 seconds (ref 24–36)

## 2021-02-25 LAB — PROTIME-INR
INR: 1 (ref 0.8–1.2)
Prothrombin Time: 12.9 seconds (ref 11.4–15.2)

## 2021-02-25 LAB — SARS CORONAVIRUS 2 (TAT 6-24 HRS): SARS Coronavirus 2: NEGATIVE

## 2021-02-25 NOTE — Progress Notes (Signed)
   02/25/21 1326  OBSTRUCTIVE SLEEP APNEA  Have you ever been diagnosed with sleep apnea through a sleep study? No  Do you snore loudly (loud enough to be heard through closed doors)?  1  Do you often feel tired, fatigued, or sleepy during the daytime (such as falling asleep during driving or talking to someone)? 0  Has anyone observed you stop breathing during your sleep? 1  Do you have, or are you being treated for high blood pressure? 0  BMI more than 35 kg/m2? 1  Age > 50 (1-yes) 1  Neck circumference greater than:Male 16 inches or larger, Male 17inches or larger? 1  Male Gender (Yes=1) 1  Obstructive Sleep Apnea Score 6  Score 5 or greater  Results sent to PCP

## 2021-02-25 NOTE — Progress Notes (Addendum)
PCP - Chana Bode, MD Pulmonologist- Baltazar Apo, MD Oncologist- Curt Bears, MD  PPM/ICD - Denies  Chest x-ray - 02/25/21 EKG - 02/25/21 Stress Test - 10/12/19 ECHO - Denies Cardiac Cath - Denies  Sleep Study - Denies  Pt is not diabetic.  Blood Thinner Instructions: N/A Aspirin Instructions: Per pt,last dose 02/26/20  ERAS Protcol - N/A PRE-SURGERY Ensure or G2- N/A  COVID TEST- 02/25/21   Anesthesia review: Yes, pulm hx; REVIEW  Stress test   Patient denies shortness of breath, fever, cough and chest pain at PAT appointment   All instructions explained to the patient, with a verbal understanding of the material. Patient agrees to go over the instructions while at home for a better understanding. Patient also instructed to self quarantine after being tested for COVID-19. The opportunity to ask questions was provided.

## 2021-02-27 ENCOUNTER — Encounter (HOSPITAL_COMMUNITY): Payer: Self-pay | Admitting: Thoracic Surgery (Cardiothoracic Vascular Surgery)

## 2021-02-27 ENCOUNTER — Inpatient Hospital Stay (HOSPITAL_COMMUNITY): Payer: Medicare HMO | Admitting: Certified Registered"

## 2021-02-27 ENCOUNTER — Inpatient Hospital Stay (HOSPITAL_COMMUNITY): Payer: Medicare HMO

## 2021-02-27 ENCOUNTER — Inpatient Hospital Stay (HOSPITAL_COMMUNITY): Payer: Medicare HMO | Admitting: Physician Assistant

## 2021-02-27 ENCOUNTER — Inpatient Hospital Stay (HOSPITAL_COMMUNITY)
Admission: RE | Admit: 2021-02-27 | Discharge: 2021-03-03 | DRG: 167 | Disposition: A | Discharge status: Home / Self Care | Payer: Medicare HMO | Attending: Thoracic Surgery (Cardiothoracic Vascular Surgery) | Admitting: Thoracic Surgery (Cardiothoracic Vascular Surgery)

## 2021-02-27 ENCOUNTER — Encounter (HOSPITAL_COMMUNITY)
Admission: RE | Disposition: A | Discharge status: Home / Self Care | Payer: Self-pay | Source: Home / Self Care | Attending: Thoracic Surgery (Cardiothoracic Vascular Surgery)

## 2021-02-27 ENCOUNTER — Other Ambulatory Visit: Payer: Self-pay

## 2021-02-27 DIAGNOSIS — C782 Secondary malignant neoplasm of pleura: Secondary | ICD-10-CM | POA: Diagnosis not present

## 2021-02-27 DIAGNOSIS — R918 Other nonspecific abnormal finding of lung field: Secondary | ICD-10-CM | POA: Diagnosis not present

## 2021-02-27 DIAGNOSIS — R0600 Dyspnea, unspecified: Secondary | ICD-10-CM | POA: Diagnosis not present

## 2021-02-27 DIAGNOSIS — K219 Gastro-esophageal reflux disease without esophagitis: Secondary | ICD-10-CM | POA: Diagnosis present

## 2021-02-27 DIAGNOSIS — Z9889 Other specified postprocedural states: Secondary | ICD-10-CM

## 2021-02-27 DIAGNOSIS — K3184 Gastroparesis: Secondary | ICD-10-CM | POA: Diagnosis not present

## 2021-02-27 DIAGNOSIS — N401 Enlarged prostate with lower urinary tract symptoms: Secondary | ICD-10-CM | POA: Diagnosis not present

## 2021-02-27 DIAGNOSIS — R338 Other retention of urine: Secondary | ICD-10-CM | POA: Diagnosis not present

## 2021-02-27 DIAGNOSIS — J910 Malignant pleural effusion: Secondary | ICD-10-CM | POA: Diagnosis present

## 2021-02-27 DIAGNOSIS — C384 Malignant neoplasm of pleura: Secondary | ICD-10-CM | POA: Diagnosis not present

## 2021-02-27 DIAGNOSIS — Z20822 Contact with and (suspected) exposure to covid-19: Secondary | ICD-10-CM | POA: Diagnosis not present

## 2021-02-27 DIAGNOSIS — Z7982 Long term (current) use of aspirin: Secondary | ICD-10-CM | POA: Diagnosis not present

## 2021-02-27 DIAGNOSIS — M1990 Unspecified osteoarthritis, unspecified site: Secondary | ICD-10-CM | POA: Diagnosis present

## 2021-02-27 DIAGNOSIS — C801 Malignant (primary) neoplasm, unspecified: Secondary | ICD-10-CM | POA: Diagnosis not present

## 2021-02-27 DIAGNOSIS — R059 Cough, unspecified: Secondary | ICD-10-CM | POA: Diagnosis not present

## 2021-02-27 DIAGNOSIS — J9811 Atelectasis: Secondary | ICD-10-CM | POA: Diagnosis not present

## 2021-02-27 DIAGNOSIS — Z85828 Personal history of other malignant neoplasm of skin: Secondary | ICD-10-CM

## 2021-02-27 DIAGNOSIS — J90 Pleural effusion, not elsewhere classified: Secondary | ICD-10-CM | POA: Diagnosis not present

## 2021-02-27 DIAGNOSIS — I700 Atherosclerosis of aorta: Secondary | ICD-10-CM | POA: Diagnosis present

## 2021-02-27 DIAGNOSIS — J438 Other emphysema: Secondary | ICD-10-CM | POA: Diagnosis present

## 2021-02-27 DIAGNOSIS — J811 Chronic pulmonary edema: Secondary | ICD-10-CM | POA: Diagnosis not present

## 2021-02-27 DIAGNOSIS — Z7951 Long term (current) use of inhaled steroids: Secondary | ICD-10-CM

## 2021-02-27 DIAGNOSIS — Z87891 Personal history of nicotine dependence: Secondary | ICD-10-CM

## 2021-02-27 DIAGNOSIS — I517 Cardiomegaly: Secondary | ICD-10-CM | POA: Diagnosis not present

## 2021-02-27 DIAGNOSIS — Z4682 Encounter for fitting and adjustment of non-vascular catheter: Secondary | ICD-10-CM

## 2021-02-27 DIAGNOSIS — C450 Mesothelioma of pleura: Principal | ICD-10-CM | POA: Diagnosis present

## 2021-02-27 DIAGNOSIS — C3490 Malignant neoplasm of unspecified part of unspecified bronchus or lung: Secondary | ICD-10-CM | POA: Diagnosis not present

## 2021-02-27 DIAGNOSIS — Z9049 Acquired absence of other specified parts of digestive tract: Secondary | ICD-10-CM | POA: Diagnosis not present

## 2021-02-27 DIAGNOSIS — R846 Abnormal cytological findings in specimens from respiratory organs and thorax: Secondary | ICD-10-CM | POA: Diagnosis not present

## 2021-02-27 DIAGNOSIS — R222 Localized swelling, mass and lump, trunk: Secondary | ICD-10-CM

## 2021-02-27 DIAGNOSIS — I2510 Atherosclerotic heart disease of native coronary artery without angina pectoris: Secondary | ICD-10-CM | POA: Diagnosis present

## 2021-02-27 DIAGNOSIS — Z79899 Other long term (current) drug therapy: Secondary | ICD-10-CM

## 2021-02-27 DIAGNOSIS — J948 Other specified pleural conditions: Secondary | ICD-10-CM | POA: Diagnosis not present

## 2021-02-27 DIAGNOSIS — R0602 Shortness of breath: Secondary | ICD-10-CM | POA: Diagnosis not present

## 2021-02-27 HISTORY — PX: VIDEO ASSISTED THORACOSCOPY (VATS) W/TALC PLEUADESIS: SHX6168

## 2021-02-27 LAB — ABO/RH: ABO/RH(D): O NEG

## 2021-02-27 SURGERY — VIDEO ASSISTED THORACOSCOPY (VATS) W/TALC PLEUADESIS
Anesthesia: General | Site: Chest | Laterality: Right

## 2021-02-27 MED ORDER — PROMETHAZINE HCL 25 MG/ML IJ SOLN: 6.2500 mg | INTRAMUSCULAR | Status: DC | PRN

## 2021-02-27 MED ORDER — EPHEDRINE 5 MG/ML INJ
INTRAVENOUS | Status: AC
  Filled 2021-02-27: qty 10

## 2021-02-27 MED ORDER — TALC (STERITALC) POWDER FOR INTRAPLEURAL USE
INTRAPLEURAL | Status: DC | PRN
  Administered 2021-02-27: 4 g via INTRAPLEURAL

## 2021-02-27 MED ORDER — CEFAZOLIN SODIUM-DEXTROSE 2-4 GM/100ML-% IV SOLN
2.0000 g | Freq: Three times a day (TID) | INTRAVENOUS | Status: AC
  Administered 2021-02-27 – 2021-02-28 (×2): 2 g via INTRAVENOUS
  Filled 2021-02-27 (×2): qty 100

## 2021-02-27 MED ORDER — HYDROMORPHONE HCL 1 MG/ML IJ SOLN
INTRAMUSCULAR | Status: AC
  Filled 2021-02-27: qty 1

## 2021-02-27 MED ORDER — DEXAMETHASONE SODIUM PHOSPHATE 10 MG/ML IJ SOLN
INTRAMUSCULAR | Status: DC | PRN
  Administered 2021-02-27: 5 mg via INTRAVENOUS

## 2021-02-27 MED ORDER — LACTATED RINGERS IV SOLN: INTRAVENOUS | Status: DC

## 2021-02-27 MED ORDER — BUPIVACAINE LIPOSOME 1.3 % IJ SUSP
INTRAMUSCULAR | Status: AC
  Filled 2021-02-27: qty 20

## 2021-02-27 MED ORDER — SUFENTANIL CITRATE 50 MCG/ML IV SOLN
INTRAVENOUS | Status: AC
  Filled 2021-02-27: qty 1

## 2021-02-27 MED ORDER — BISACODYL 5 MG PO TBEC
10.0000 mg | DELAYED_RELEASE_TABLET | Freq: Every day | ORAL | Status: DC
  Administered 2021-03-02 – 2021-03-03 (×2): 10 mg via ORAL
  Filled 2021-02-27 (×2): qty 2

## 2021-02-27 MED ORDER — CHLORHEXIDINE GLUCONATE 0.12 % MT SOLN
OROMUCOSAL | Status: AC
  Administered 2021-02-27: 15 mL via OROMUCOSAL
  Filled 2021-02-27: qty 15

## 2021-02-27 MED ORDER — PHENYLEPHRINE 40 MCG/ML (10ML) SYRINGE FOR IV PUSH (FOR BLOOD PRESSURE SUPPORT)
PREFILLED_SYRINGE | INTRAVENOUS | Status: AC
  Filled 2021-02-27: qty 20

## 2021-02-27 MED ORDER — TALC (STERITALC) POWDER FOR INTRAPLEURAL USE
INTRAPLEURAL | Status: AC
  Filled 2021-02-27: qty 4

## 2021-02-27 MED ORDER — MIDAZOLAM HCL 5 MG/5ML IJ SOLN
INTRAMUSCULAR | Status: DC | PRN
  Administered 2021-02-27: 2 mg via INTRAVENOUS

## 2021-02-27 MED ORDER — ORAL CARE MOUTH RINSE: 15.0000 mL | Freq: Once | OROMUCOSAL | Status: AC

## 2021-02-27 MED ORDER — KETOROLAC TROMETHAMINE 30 MG/ML IJ SOLN
30.0000 mg | Freq: Four times a day (QID) | INTRAMUSCULAR | Status: AC | PRN
  Administered 2021-02-27 – 2021-02-28 (×2): 30 mg via INTRAVENOUS
  Filled 2021-02-27 (×2): qty 1

## 2021-02-27 MED ORDER — UMECLIDINIUM BROMIDE 62.5 MCG/INH IN AEPB
1.0000 | INHALATION_SPRAY | Freq: Every day | RESPIRATORY_TRACT | Status: DC
  Administered 2021-02-28 – 2021-03-03 (×4): 1 via RESPIRATORY_TRACT
  Filled 2021-02-27: qty 7

## 2021-02-27 MED ORDER — SUGAMMADEX SODIUM 200 MG/2ML IV SOLN
INTRAVENOUS | Status: DC | PRN
  Administered 2021-02-27: 200 mg via INTRAVENOUS

## 2021-02-27 MED ORDER — PROPOFOL 10 MG/ML IV BOLUS
INTRAVENOUS | Status: AC
  Filled 2021-02-27: qty 20

## 2021-02-27 MED ORDER — ROCURONIUM BROMIDE 10 MG/ML (PF) SYRINGE
PREFILLED_SYRINGE | INTRAVENOUS | Status: AC
  Filled 2021-02-27: qty 10

## 2021-02-27 MED ORDER — MIDAZOLAM HCL 2 MG/2ML IJ SOLN
INTRAMUSCULAR | Status: AC
  Filled 2021-02-27: qty 2

## 2021-02-27 MED ORDER — PHENYLEPHRINE 40 MCG/ML (10ML) SYRINGE FOR IV PUSH (FOR BLOOD PRESSURE SUPPORT)
PREFILLED_SYRINGE | INTRAVENOUS | Status: DC | PRN
  Administered 2021-02-27: 80 ug via INTRAVENOUS
  Administered 2021-02-27: 120 ug via INTRAVENOUS
  Administered 2021-02-27: 80 ug via INTRAVENOUS

## 2021-02-27 MED ORDER — CEFAZOLIN SODIUM-DEXTROSE 2-4 GM/100ML-% IV SOLN
INTRAVENOUS | Status: AC
  Filled 2021-02-27: qty 100

## 2021-02-27 MED ORDER — SENNOSIDES-DOCUSATE SODIUM 8.6-50 MG PO TABS
1.0000 | ORAL_TABLET | Freq: Every day | ORAL | Status: DC
  Administered 2021-02-27 – 2021-03-02 (×3): 1 via ORAL
  Filled 2021-02-27 (×3): qty 1

## 2021-02-27 MED ORDER — ONDANSETRON HCL 4 MG/2ML IJ SOLN
4.0000 mg | Freq: Four times a day (QID) | INTRAMUSCULAR | Status: DC | PRN
  Administered 2021-02-27 – 2021-03-02 (×5): 4 mg via INTRAVENOUS
  Filled 2021-02-27 (×4): qty 2

## 2021-02-27 MED ORDER — LIDOCAINE 2% (20 MG/ML) 5 ML SYRINGE
INTRAMUSCULAR | Status: DC | PRN
  Administered 2021-02-27: 60 mg via INTRAVENOUS

## 2021-02-27 MED ORDER — ACETAMINOPHEN 500 MG PO TABS
1000.0000 mg | ORAL_TABLET | Freq: Four times a day (QID) | ORAL | Status: DC
  Administered 2021-02-27 – 2021-03-02 (×12): 1000 mg via ORAL
  Filled 2021-02-27 (×12): qty 2

## 2021-02-27 MED ORDER — SUFENTANIL CITRATE 50 MCG/ML IV SOLN
INTRAVENOUS | Status: DC | PRN
  Administered 2021-02-27: 15 ug via INTRAVENOUS
  Administered 2021-02-27: 5 ug via INTRAVENOUS

## 2021-02-27 MED ORDER — OXYCODONE HCL 5 MG PO TABS
5.0000 mg | ORAL_TABLET | ORAL | Status: DC | PRN
  Administered 2021-02-28: 5 mg via ORAL
  Administered 2021-03-01 (×2): 10 mg via ORAL
  Filled 2021-02-27: qty 2
  Filled 2021-02-27: qty 1
  Filled 2021-02-27: qty 2

## 2021-02-27 MED ORDER — ONDANSETRON HCL 4 MG/2ML IJ SOLN
INTRAMUSCULAR | Status: AC
  Filled 2021-02-27: qty 2

## 2021-02-27 MED ORDER — UMECLIDINIUM BROMIDE 62.5 MCG/INH IN AEPB
1.0000 | INHALATION_SPRAY | Freq: Every day | RESPIRATORY_TRACT | Status: DC
  Filled 2021-02-27: qty 7

## 2021-02-27 MED ORDER — ONDANSETRON HCL 4 MG/2ML IJ SOLN
INTRAMUSCULAR | Status: DC | PRN
  Administered 2021-02-27: 4 mg via INTRAVENOUS

## 2021-02-27 MED ORDER — CEFAZOLIN SODIUM-DEXTROSE 2-4 GM/100ML-% IV SOLN
2.0000 g | INTRAVENOUS | Status: AC
  Administered 2021-02-27: 2 g via INTRAVENOUS

## 2021-02-27 MED ORDER — FENTANYL CITRATE (PF) 100 MCG/2ML IJ SOLN
INTRAMUSCULAR | Status: AC
  Filled 2021-02-27: qty 2

## 2021-02-27 MED ORDER — SODIUM CHLORIDE FLUSH 0.9 % IV SOLN
INTRAVENOUS | Status: DC | PRN
  Administered 2021-02-27: 10 mL

## 2021-02-27 MED ORDER — 0.9 % SODIUM CHLORIDE (POUR BTL) OPTIME
TOPICAL | Status: DC | PRN
  Administered 2021-02-27: 1000 mL

## 2021-02-27 MED ORDER — ROCURONIUM BROMIDE 10 MG/ML (PF) SYRINGE
PREFILLED_SYRINGE | INTRAVENOUS | Status: DC | PRN
  Administered 2021-02-27: 60 mg via INTRAVENOUS

## 2021-02-27 MED ORDER — ATORVASTATIN CALCIUM 80 MG PO TABS
80.0000 mg | ORAL_TABLET | Freq: Every day | ORAL | Status: DC
  Administered 2021-02-28 – 2021-03-03 (×4): 80 mg via ORAL
  Filled 2021-02-27 (×4): qty 1

## 2021-02-27 MED ORDER — SODIUM CHLORIDE 0.9 % IV SOLN
500.0000 mg | Freq: Once | INTRAVENOUS | Status: DC
  Filled 2021-02-27: qty 500

## 2021-02-27 MED ORDER — BUPIVACAINE HCL (PF) 0.5 % IJ SOLN
INTRAMUSCULAR | Status: AC
  Filled 2021-02-27: qty 30

## 2021-02-27 MED ORDER — ACETAMINOPHEN 10 MG/ML IV SOLN: 1000.0000 mg | Freq: Once | INTRAVENOUS | Status: DC | PRN

## 2021-02-27 MED ORDER — TRAMADOL HCL 50 MG PO TABS
50.0000 mg | ORAL_TABLET | Freq: Four times a day (QID) | ORAL | Status: DC | PRN
  Administered 2021-03-02: 50 mg via ORAL
  Filled 2021-02-27: qty 1

## 2021-02-27 MED ORDER — FLUTICASONE FUROATE-VILANTEROL 100-25 MCG/INH IN AEPB
1.0000 | INHALATION_SPRAY | Freq: Every day | RESPIRATORY_TRACT | Status: DC
  Administered 2021-02-28 – 2021-03-03 (×4): 1 via RESPIRATORY_TRACT
  Filled 2021-02-27: qty 28

## 2021-02-27 MED ORDER — FENTANYL CITRATE (PF) 100 MCG/2ML IJ SOLN
25.0000 ug | INTRAMUSCULAR | Status: DC | PRN
  Administered 2021-02-27 (×3): 50 ug via INTRAVENOUS

## 2021-02-27 MED ORDER — ALBUTEROL SULFATE (2.5 MG/3ML) 0.083% IN NEBU: 3.0000 mL | INHALATION_SOLUTION | Freq: Four times a day (QID) | RESPIRATORY_TRACT | Status: DC | PRN

## 2021-02-27 MED ORDER — LIDOCAINE 2% (20 MG/ML) 5 ML SYRINGE
INTRAMUSCULAR | Status: AC
  Filled 2021-02-27: qty 5

## 2021-02-27 MED ORDER — PROPOFOL 10 MG/ML IV BOLUS
INTRAVENOUS | Status: DC | PRN
  Administered 2021-02-27: 30 mg via INTRAVENOUS
  Administered 2021-02-27: 120 mg via INTRAVENOUS

## 2021-02-27 MED ORDER — ASPIRIN EC 81 MG PO TBEC
81.0000 mg | DELAYED_RELEASE_TABLET | Freq: Every day | ORAL | Status: DC
  Administered 2021-02-28 – 2021-03-03 (×4): 81 mg via ORAL
  Filled 2021-02-27 (×4): qty 1

## 2021-02-27 MED ORDER — CHLORHEXIDINE GLUCONATE 0.12 % MT SOLN: 15.0000 mL | Freq: Once | OROMUCOSAL | Status: AC

## 2021-02-27 MED ORDER — ENOXAPARIN SODIUM 40 MG/0.4ML IJ SOSY
40.0000 mg | PREFILLED_SYRINGE | Freq: Every day | INTRAMUSCULAR | Status: DC
  Administered 2021-02-28 – 2021-03-03 (×4): 40 mg via SUBCUTANEOUS
  Filled 2021-02-27 (×4): qty 0.4

## 2021-02-27 MED ORDER — ACETAMINOPHEN 160 MG/5ML PO SOLN: 1000.0000 mg | Freq: Four times a day (QID) | ORAL | Status: DC

## 2021-02-27 MED ORDER — EPHEDRINE SULFATE-NACL 50-0.9 MG/10ML-% IV SOSY
PREFILLED_SYRINGE | INTRAVENOUS | Status: DC | PRN
  Administered 2021-02-27 (×2): 10 mg via INTRAVENOUS

## 2021-02-27 MED ORDER — MORPHINE SULFATE (PF) 2 MG/ML IV SOLN
2.0000 mg | INTRAVENOUS | Status: DC | PRN
  Administered 2021-02-27 – 2021-03-02 (×4): 2 mg via INTRAVENOUS
  Filled 2021-02-27 (×4): qty 1

## 2021-02-27 SURGICAL SUPPLY — 96 items
ADH SKN CLS APL DERMABOND .7 (GAUZE/BANDAGES/DRESSINGS) ×2
APL SRG 22X2 LUM MLBL SLNT (VASCULAR PRODUCTS)
APPLICATOR TIP EXT COSEAL (VASCULAR PRODUCTS) IMPLANT
BAG SPEC RTRVL LRG 6X4 10 (ENDOMECHANICALS)
BLADE CLIPPER SURG (BLADE) ×3 IMPLANT
CANISTER SUCT 3000ML PPV (MISCELLANEOUS) ×3 IMPLANT
CATH KIT ON-Q SILVERSOAK 5 (CATHETERS) IMPLANT
CATH KIT ON-Q SILVERSOAK 5IN (CATHETERS) IMPLANT
CATH ROBINSON RED A/P 18FR (CATHETERS) ×2 IMPLANT
CATH THORACIC 28FR (CATHETERS) IMPLANT
CATH THORACIC 28FR RT ANG (CATHETERS) IMPLANT
CATH THORACIC 36FR (CATHETERS) IMPLANT
CATH THORACIC 36FR RT ANG (CATHETERS) IMPLANT
CLIP VESOCCLUDE MED 6/CT (CLIP) ×3 IMPLANT
CNTNR URN SCR LID CUP LEK RST (MISCELLANEOUS) ×4 IMPLANT
CONN ST 1/4X3/8  BEN (MISCELLANEOUS)
CONN ST 1/4X3/8 BEN (MISCELLANEOUS) IMPLANT
CONN Y 3/8X3/8X3/8  BEN (MISCELLANEOUS)
CONN Y 3/8X3/8X3/8 BEN (MISCELLANEOUS) IMPLANT
CONT SPEC 4OZ STRL OR WHT (MISCELLANEOUS) ×6
COVER SURGICAL LIGHT HANDLE (MISCELLANEOUS) ×6 IMPLANT
DERMABOND ADVANCED (GAUZE/BANDAGES/DRESSINGS) ×1
DERMABOND ADVANCED .7 DNX12 (GAUZE/BANDAGES/DRESSINGS) ×2 IMPLANT
DRAIN CHANNEL 28F RND 3/8 FF (WOUND CARE) ×2 IMPLANT
DRAIN CHANNEL 32F RND 10.7 FF (WOUND CARE) IMPLANT
DRAPE C-ARM 42X72 X-RAY (DRAPES) ×3 IMPLANT
DRAPE CV SPLIT W-CLR ANES SCRN (DRAPES) ×3 IMPLANT
DRAPE LAPAROSCOPIC ABDOMINAL (DRAPES) ×3 IMPLANT
DRAPE ORTHO SPLIT 77X108 STRL (DRAPES) ×3
DRAPE SURG ORHT 6 SPLT 77X108 (DRAPES) ×2 IMPLANT
DRAPE WARM FLUID 44X44 (DRAPES) ×4 IMPLANT
DRSG XEROFORM 1X8 (GAUZE/BANDAGES/DRESSINGS) ×2 IMPLANT
ELECT BLADE 6.5 EXT (BLADE) ×3 IMPLANT
ELECT REM PT RETURN 9FT ADLT (ELECTROSURGICAL) ×3
ELECTRODE REM PT RTRN 9FT ADLT (ELECTROSURGICAL) ×2 IMPLANT
GAUZE SPONGE 4X4 12PLY STRL (GAUZE/BANDAGES/DRESSINGS) ×3 IMPLANT
GLOVE SURG SIGNA 7.5 PF LTX (GLOVE) ×6 IMPLANT
GOWN STRL REUS W/ TWL LRG LVL3 (GOWN DISPOSABLE) ×4 IMPLANT
GOWN STRL REUS W/ TWL XL LVL3 (GOWN DISPOSABLE) ×2 IMPLANT
GOWN STRL REUS W/TWL LRG LVL3 (GOWN DISPOSABLE) ×6
GOWN STRL REUS W/TWL XL LVL3 (GOWN DISPOSABLE) ×3
HANDLE STAPLE ENDO GIA SHORT (STAPLE)
HEMOSTAT SURGICEL 2X14 (HEMOSTASIS) ×2 IMPLANT
KIT BASIN OR (CUSTOM PROCEDURE TRAY) ×3 IMPLANT
KIT PLEURX DRAIN CATH 1000ML (MISCELLANEOUS) ×3 IMPLANT
KIT PLEURX DRAIN CATH 15.5FR (DRAIN) ×3 IMPLANT
KIT TURNOVER KIT B (KITS) ×3 IMPLANT
NDL HYPO 25GX1X1/2 BEV (NEEDLE) ×1 IMPLANT
NDL SPNL 18GX3.5 QUINCKE PK (NEEDLE) IMPLANT
NEEDLE HYPO 25GX1X1/2 BEV (NEEDLE) ×3 IMPLANT
NEEDLE SPNL 18GX3.5 QUINCKE PK (NEEDLE) IMPLANT
NS IRRIG 1000ML POUR BTL (IV SOLUTION) ×9 IMPLANT
PACK CHEST (CUSTOM PROCEDURE TRAY) ×3 IMPLANT
PACK GENERAL/GYN (CUSTOM PROCEDURE TRAY) ×3 IMPLANT
PAD ARMBOARD 7.5X6 YLW CONV (MISCELLANEOUS) ×6 IMPLANT
POUCH ENDO CATCH II 15MM (MISCELLANEOUS) IMPLANT
POUCH SPECIMEN RETRIEVAL 10MM (ENDOMECHANICALS) IMPLANT
SEALANT PROGEL (MISCELLANEOUS) IMPLANT
SEALANT SURG COSEAL 4ML (VASCULAR PRODUCTS) IMPLANT
SEALANT SURG COSEAL 8ML (VASCULAR PRODUCTS) IMPLANT
SET DRAINAGE LINE (MISCELLANEOUS) IMPLANT
SOL ANTI FOG 6CC (MISCELLANEOUS) ×4 IMPLANT
SOLUTION ANTI FOG 6CC (MISCELLANEOUS) ×2
SPONGE TONSIL TAPE 1 RFD (DISPOSABLE) ×3 IMPLANT
STAPLER ENDO GIA 12MM SHORT (STAPLE) IMPLANT
SUT ETHILON 3 0 FSL (SUTURE) ×3 IMPLANT
SUT PROLENE 4 0 RB 1 (SUTURE)
SUT PROLENE 4-0 RB1 .5 CRCL 36 (SUTURE) IMPLANT
SUT SILK  1 MH (SUTURE) ×3
SUT SILK 1 MH (SUTURE) ×3 IMPLANT
SUT SILK 1 TIES 10X30 (SUTURE) ×3 IMPLANT
SUT SILK 2 0SH CR/8 30 (SUTURE) IMPLANT
SUT SILK 3 0SH CR/8 30 (SUTURE) IMPLANT
SUT VIC AB 1 CTX 36 (SUTURE) ×3
SUT VIC AB 1 CTX36XBRD ANBCTR (SUTURE) ×1 IMPLANT
SUT VIC AB 2-0 CTX 36 (SUTURE) ×3 IMPLANT
SUT VIC AB 3-0 MH 27 (SUTURE) IMPLANT
SUT VIC AB 3-0 X1 27 (SUTURE) ×3 IMPLANT
SUT VICRYL 0 UR6 27IN ABS (SUTURE) ×2 IMPLANT
SUT VICRYL 2 TP 1 (SUTURE) IMPLANT
SYR 10ML LL (SYRINGE) IMPLANT
SYR 30ML LL (SYRINGE) ×3 IMPLANT
SYR BULB EAR ULCER 3OZ GRN STR (SYRINGE) ×3 IMPLANT
SYR CONTROL 10ML LL (SYRINGE) ×3 IMPLANT
SYSTEM SAHARA CHEST DRAIN ATS (WOUND CARE) ×3 IMPLANT
TAPE CLOTH 4X10 WHT NS (GAUZE/BANDAGES/DRESSINGS) ×3 IMPLANT
TIP APPLICATOR SPRAY EXTEND 16 (VASCULAR PRODUCTS) IMPLANT
TOWEL GREEN STERILE (TOWEL DISPOSABLE) ×3 IMPLANT
TOWEL GREEN STERILE FF (TOWEL DISPOSABLE) ×3 IMPLANT
TRAP SPECIMEN MUCUS 40CC (MISCELLANEOUS) ×2 IMPLANT
TRAY FOLEY MTR SLVR 16FR STAT (SET/KITS/TRAYS/PACK) ×3 IMPLANT
TROCAR XCEL BLADELESS 5X75MML (TROCAR) ×3 IMPLANT
TUBE SUCT ARGYLE STRL (TUBING) ×3 IMPLANT
TUNNELER SHEATH ON-Q 11GX8 DSP (PAIN MANAGEMENT) IMPLANT
VALVE REPLACEMENT CAP (MISCELLANEOUS) IMPLANT
WATER STERILE IRR 1000ML POUR (IV SOLUTION) ×3 IMPLANT

## 2021-02-27 NOTE — Progress Notes (Signed)
A Line discontinued at this time pressure held to site and dry dressing in place patient tolerated procedure well

## 2021-02-27 NOTE — Interval H&P Note (Signed)
History and Physical Interval Note:  02/27/2021 12:47 PM  Guy Evans  has presented today for surgery, with the diagnosis of Right Pleural Effusion, Pleural nodules.  The various methods of treatment have been discussed with the patient and family. After consideration of risks, benefits and other options for treatment, the patient has consented to  Procedure(s): VIDEO ASSISTED THORACOSCOPY (VATS) W/ POSSIBLE TALC PLEUADESIS and PLEURAL BIOPSY (Right) POSSIBLE INSERTION PLEURAL DRAINAGE CATHETER (Right) as a surgical intervention.  The patient's history has been reviewed, patient examined, no change in status, stable for surgery.  I have reviewed the patient's chart and labs.  Questions were answered to the patient's satisfaction.     Melrose Nakayama

## 2021-02-27 NOTE — Op Note (Signed)
Guy Evans, Guy Evans MEDICAL RECORD NO: [id number] ACCOUNT NO: [id number] DATE OF BIRTH: October 09, 1950 FACILITY: [facility] LOCATION: [id number] PHYSICIAN: Revonda Standard. Roxan Hockey, MD  Operative Report   DATE OF PROCEDURE: 02/27/2021  PREOPERATIVE DIAGNOSES:  Right pleural effusion and pleural nodules.  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  PROCEDURE:  Right video-assisted thoracoscopy with pleural biopsy and talc pleurodesis.  SURGEON:  Modesto Charon, MD  ASSISTANT:  Ellwood Handler, PA  ANESTHESIA:  General.  FINDINGS:  Approximately 750 mL of serous pleural fluid evacuated.  Complete involvement of the parietal pleura with a nodular tumor infiltrate.  Frozen section revealed malignant cells, likely adenocarcinoma, but could not rule out mesothelioma.   Incomplete reexpansion of right lower lobe.  CLINICAL NOTE:  Guy Evans is a 70 year old gentleman with a history of tobacco abuse and worked in the English as a second language teacher.  He presented with shortness of breath with exertion.  He was ultimately found to have a large right pleural effusion.  He had thoracentesis twice, which showed atypical cells that were suspicious for, but not diagnostic of mesothelioma.  He now is referred for pleural biopsy and management of the pleural effusion.  The indications, risks, benefits, and alternatives  were discussed in detail with the patient.  He understood and accepted the risks and agreed to proceed.  DESCRIPTION OF PROCEDURE:  Guy Evans was brought to the operating room on 02/27/2021.  He had induction of general anesthesia and was intubated with a double lumen endotracheal tube, with difficulty positioning the tube and ended up positioned in the  right mainstem.  A Foley catheter was placed.  Sequential compression devices were placed on the calves for DVT prophylaxis.  Intravenous antibiotics were administered.  He was placed in a left lateral decubitus  position and the right chest was prepped  and draped in the usual sterile fashion.  Single lung ventilation of the left lung was initiated and was tolerated well throughout the procedure.  A timeout was performed.  The area for the incision in the seventh interspace anterolaterally was injected with a solution containing 20 mL of liposomal bupivacaine, 30 mL of 0.5% bupivacaine and 50 mL of saline.  Incision was made and carried through  the skin and subcutaneous tissue.  The serratus fibers were separated.  The intercostal muscles were divided.  There was thickening of the pleura at the site of chest entry. On entry of the chest, there was approximately 750 mL of serous fluid evacuated,  some of this was sent for cytology.  A scope was inserted and it was clear that the right lung was entrapped, the lower lobe, in particular.  There were adhesions of the upper and middle lobe to the chest wall. On the parietal pleura, there was a white  sheet of tumor with a nodular appearance.  Biopsies were taken and sent for frozen section.  Multiple additional biopsies were taken and sent for permanent pathology.  The frozen section returned showing likely adenocarcinoma, but could not rule out  mesothelioma.  Four grams of talc was insufflated into the chest.  A 28-French Blake drain was placed through the incision and then the incision was closed around the tube in standard fashion.  The patient then was extubated in the operating room and  taken to the postanesthetic care unit in good condition.   SHW D: 02/27/2021 4:57:20 pm T: 02/27/2021 11:57:00 pm  JOB: [id number]/ [id number]

## 2021-02-27 NOTE — Anesthesia Preprocedure Evaluation (Addendum)
Anesthesia Evaluation  Patient identified by MRN, date of birth, ID band Patient awake    Reviewed: Allergy & Precautions, NPO status , Patient's Chart, lab work & pertinent test results  Airway Mallampati: II  TM Distance: >3 FB Neck ROM: Full    Dental  (+) Teeth Intact   Pulmonary COPD,  COPD inhaler, former smoker,  Pleural effusion with nodules   Pulmonary exam normal        Cardiovascular + CAD   Rhythm:Regular Rate:Normal     Neuro/Psych negative neurological ROS  negative psych ROS   GI/Hepatic Neg liver ROS, GERD  Medicated,  Endo/Other  negative endocrine ROS  Renal/GU negative Renal ROS  negative genitourinary   Musculoskeletal  (+) Arthritis ,   Abdominal (+)  Abdomen: soft. Bowel sounds: normal.  Peds  Hematology   Anesthesia Other Findings   Reproductive/Obstetrics                            Anesthesia Physical Anesthesia Plan  ASA: 3  Anesthesia Plan: General   Post-op Pain Management:    Induction: Intravenous  PONV Risk Score and Plan: 2 and Ondansetron, Dexamethasone and Treatment may vary due to age or medical condition  Airway Management Planned: Mask and Double Lumen EBT  Additional Equipment: Arterial line  Intra-op Plan:   Post-operative Plan: Extubation in OR  Informed Consent: I have reviewed the patients History and Physical, chart, labs and discussed the procedure including the risks, benefits and alternatives for the proposed anesthesia with the patient or authorized representative who has indicated his/her understanding and acceptance.     Dental advisory given  Plan Discussed with: CRNA  Anesthesia Plan Comments: (? Nuclear stress EF: 63%. ? There was no ST segment deviation noted during stress. ? The study is normal. ? This is a low risk study. ? The left ventricular ejection fraction is normal (55-65%).   Normal stress nuclear study  with no ischemia or infarction.  Gated ejection fraction 63% with normal wall motion.  .Lab Results      Component                Value               Date                      WBC                      6.4                 02/25/2021                HGB                      12.9 (L)            02/25/2021                HCT                      38.9 (L)            02/25/2021                MCV                      91.3  02/25/2021                PLT                      227                 02/25/2021           Lab Results      Component                Value               Date                      NA                       137                 02/25/2021                K                        3.7                 02/25/2021                CO2                      23                  02/25/2021                GLUCOSE                  120 (H)             02/25/2021                BUN                      11                  02/25/2021                CREATININE               0.85                02/25/2021                CALCIUM                  9.0                 02/25/2021                GFRNONAA                 >60                 02/25/2021                GFRAA                    82                  10/20/2020          )  Anesthesia Quick Evaluation

## 2021-02-27 NOTE — Anesthesia Procedure Notes (Signed)
Arterial Line Insertion Start/End6/24/2022 12:55 PM Performed by: Fulton Reek, CRNA, CRNA  Patient location: Pre-op. Preanesthetic checklist: patient identified, IV checked, site marked, risks and benefits discussed, surgical consent, monitors and equipment checked, pre-op evaluation, timeout performed and anesthesia consent Lidocaine 1% used for infiltration Left, radial was placed Catheter size: 20 G Hand hygiene performed  and maximum sterile barriers used   Attempts: 1 Procedure performed without using ultrasound guided technique. Following insertion, dressing applied and Biopatch. Post procedure assessment: normal and unchanged  Patient tolerated the procedure well with no immediate complications.

## 2021-02-27 NOTE — Anesthesia Postprocedure Evaluation (Signed)
Anesthesia Post Note  Patient: Guy Evans  Procedure(s) Performed: VIDEO ASSISTED THORACOSCOPY (VATS) WITH  TALC PLEUADESIS and PLEURAL BIOPSY (Right: Chest)     Patient location during evaluation: PACU Anesthesia Type: General Level of consciousness: awake Pain management: pain level controlled Vital Signs Assessment: post-procedure vital signs reviewed and stable Respiratory status: spontaneous breathing Cardiovascular status: stable Postop Assessment: no apparent nausea or vomiting Anesthetic complications: no   No notable events documented.  Last Vitals:  Vitals:   02/27/21 1715 02/27/21 1745  BP: (!) 144/72 (!) 132/53  Pulse: 86 73  Resp: 12 12  Temp:  (!) 36.3 C  SpO2: 94% 95%    Last Pain:  Vitals:   02/27/21 1745  TempSrc:   PainSc: Asleep                 Jevaun Strick

## 2021-02-27 NOTE — Brief Op Note (Addendum)
02/27/2021  3:16 PM  PATIENT:  Guy Evans  70 y.o. male  PRE-OPERATIVE DIAGNOSIS:  Right Pleural Effusion, Pleural nodules  POST-OPERATIVE DIAGNOSIS:  Metastatic cancer- adenocarcinoma vs mesothelioma  PROCEDURE:  Procedure(s): VIDEO ASSISTED THORACOSCOPY (VATS) WITH  TALC PLEUADESIS and PLEURAL BIOPSY (Right)  SURGEON:  Melrose Nakayama, MD - Primary  PHYSICIAN ASSISTANT: Leretha Pol, PA-C  ASSISTANTS: Judge Stall, RN   ANESTHESIA:   general  EBL:  Per anesthesia record.   BLOOD ADMINISTERED:none  DRAINS:  30fr Blake drain    LOCAL MEDICATIONS USED:  OTHER Exparel local  SPECIMEN:  Source of Specimen:  pleural fluid, pleural peel  DISPOSITION OF SPECIMEN:   Pathology and microbiology  COUNTS:  Correct  DICTATION: .Dragon Dictation  PLAN OF CARE: Admit to inpatient   PATIENT DISPOSITION:  PACU - hemodynamically stable.   Delay start of Pharmacological VTE agent (>24hrs) due to surgical blood loss or risk of bleeding: yes

## 2021-02-27 NOTE — Hospital Course (Addendum)
History of Present Illness:  Guy Evans is a 70 year old man with a history of tobacco abuse, reflux, gastroparesis, degenerative joint disease, and BPH.  He has a 60-pack-year history of smoking prior to quitting in 1992.  He complains of shortness of breath with exertion.  This started earlier in the year but only got significant in early May.  He saw Dr. Glade Lloyd.  They tried inhalers but it did not help much.  That led to chest x-ray which showed a large right pleural effusion.  He had a thoracentesis in the cytology showed atypical cells suspicious for but not diagnostic of mesothelioma.  He then had a second thoracentesis with similar findings.  He did have symptomatic relief with drainage of the fluid.  CT of the chest showed some pleural nodularity and a nodule in the superior segment of the right lower lobe.  A PET/CT showed hypermetabolic pleural nodularity with no definite mediastinal adenopathy.  He was evaluated by Dr. Roxan Hockey at which time the patient denied any significant weight loss, cough, wheezing, chest pain.  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  It was felt the patient should undergo VATS procedure with drainage of pleural effusion, pleural biopsy, and possible talc pleurodesis.  Hospital Course:  Guy Evans presented to [facility] on 02/27/2021.  He was taken to the operating room and underwent Right Video Assisted Thoracoscopy with Drainage of Pleural Fluid, Pleural Biopsy, and Talc Pleurodesis.  He tolerated the procedure without difficulty, was extubated, and was taken to the PACU in stable condition and later transferred to 4 E.  Chest tube was kept on suction for 48 hours in order to optimize pleurodesis.  On the first postoperative day, he had nausea with vomiting and was unable to eat.  He was started on IV fluid at 75 mL/h.  The Foley catheter  was removed.  On the second postoperative day, the chest tube was placed to waterseal.  His nausea had resolved and he resumed taking some p.o.'s.  The IV fluids were discontinued.  He developed urinary retention persisted for several hours prior to being started back on his terazosin.  In-N-Out cath was ordered at 3 PM on postop day 2 with plans to replace a Foley catheter if he has another recurrence of retention.  The patient states this is a chronic issue for him and he never fully empties his bladder.  His chest tube output decreased and his tube was removed on 03/02/2021.  Follow up CXR showed no pneumothorax and small residual right pleural effusion.  He has been able to void his bladder without difficulty and did not require further I/O catheterization.  He is ambulating without difficulty.  His pathology remains pending.  His surgical incision is clean and dry.  He is medically stable for discharge home today.

## 2021-02-27 NOTE — Transfer of Care (Signed)
Immediate Anesthesia Transfer of Care Note  Patient: Guy Evans  Procedure(s) Performed: VIDEO ASSISTED THORACOSCOPY (VATS) WITH  TALC PLEUADESIS and PLEURAL BIOPSY (Right: Chest)  Patient Location: PACU  Anesthesia Type:General  Level of Consciousness: drowsy and patient cooperative  Airway & Oxygen Therapy: Patient Spontanous Breathing and Patient connected to nasal cannula oxygen  Post-op Assessment: Report given to RN, Post -op Vital signs reviewed and stable and Patient moving all extremities  Post vital signs: Reviewed and stable  Last Vitals:  Vitals Value Taken Time  BP 141/66 02/27/21 1531  Temp    Pulse 71 02/27/21 1535  Resp 9 02/27/21 1533  SpO2 92 % 02/27/21 1535  Vitals shown include unvalidated device data.  Last Pain:  Vitals:   02/27/21 1134  TempSrc:   PainSc: 0-No pain         Complications: No notable events documented.

## 2021-02-27 NOTE — Anesthesia Procedure Notes (Addendum)
Procedure Name: Intubation Date/Time: 02/27/2021 1:47 PM Performed by: Moshe Salisbury, CRNA Pre-anesthesia Checklist: Patient identified, Emergency Drugs available, Suction available and Patient being monitored Patient Re-evaluated:Patient Re-evaluated prior to induction Oxygen Delivery Method: Circle System Utilized Preoxygenation: Pre-oxygenation with 100% oxygen Induction Type: IV induction Ventilation: Mask ventilation with difficulty and Oral airway inserted - appropriate to patient size Laryngoscope Size: 4 and Glidescope Endobronchial tube: Right and Double lumen EBT and 39 Fr Number of attempts: 1 Airway Equipment and Method: Stylet, Bougie stylet and Video-laryngoscopy Placement Confirmation: ETT inserted through vocal cords under direct vision, positive ETCO2 and breath sounds checked- equal and bilateral Secured at: 28 cm Tube secured with: Tape Dental Injury: Teeth and Oropharynx as per pre-operative assessment  Difficulty Due To: Difficulty was anticipated, Difficult Airway- due to reduced neck mobility, Difficult Airway- due to anterior larynx and Difficult Airway- due to limited oral opening Future Recommendations: Recommend- induction with short-acting agent, and alternative techniques readily available Comments: Mask ventilation with OPA, Grade IV view with Mac 4, Grade II view with Mac 3, EBT inserted, Negative EtCO2 with ventilation, EBT out, Mask vent, Laryngoscopy by G. Stoltzfus without improved view, Video laryngoscope called for, Mask vent, Great view with videolaryngoscope but unable to align EBT with glottis, Bougie inserted under videolaryngoscopy, EBT over Bougie. EtCO2 positive. EBT placement in right mainstem confirmed with FOB, unable to move to left mainstem. Decision made with Dr. Roxan Hockey to proceed with bronchial portion in right mainstem bronchus.

## 2021-02-28 ENCOUNTER — Encounter (HOSPITAL_COMMUNITY): Payer: Self-pay | Admitting: Thoracic Surgery (Cardiothoracic Vascular Surgery)

## 2021-02-28 ENCOUNTER — Inpatient Hospital Stay (HOSPITAL_COMMUNITY): Payer: Medicare HMO

## 2021-02-28 LAB — CBC
HCT: 39.1 % (ref 39.0–52.0)
Hemoglobin: 13 g/dL (ref 13.0–17.0)
MCH: 30.6 pg (ref 26.0–34.0)
MCHC: 33.2 g/dL (ref 30.0–36.0)
MCV: 92 fL (ref 80.0–100.0)
Platelets: 217 10*3/uL (ref 150–400)
RBC: 4.25 MIL/uL (ref 4.22–5.81)
RDW: 13.1 % (ref 11.5–15.5)
WBC: 10.9 10*3/uL — ABNORMAL HIGH (ref 4.0–10.5)
nRBC: 0 % (ref 0.0–0.2)

## 2021-02-28 LAB — BASIC METABOLIC PANEL
Anion gap: 6 (ref 5–15)
BUN: 9 mg/dL (ref 8–23)
CO2: 30 mmol/L (ref 22–32)
Calcium: 9 mg/dL (ref 8.9–10.3)
Chloride: 101 mmol/L (ref 98–111)
Creatinine, Ser: 0.96 mg/dL (ref 0.61–1.24)
GFR, Estimated: >60 mL/min (ref >60)
Glucose, Bld: 153 mg/dL — ABNORMAL HIGH (ref 70–99)
Potassium: 4.4 mmol/L (ref 3.5–5.1)
Sodium: 137 mmol/L (ref 135–145)

## 2021-02-28 MED ORDER — SODIUM CHLORIDE 0.9 % IV SOLN: INTRAVENOUS | Status: DC

## 2021-02-28 MED ORDER — CALCIUM CARBONATE ANTACID 500 MG PO CHEW
1.0000 | CHEWABLE_TABLET | Freq: Two times a day (BID) | ORAL | Status: DC
  Administered 2021-02-28 – 2021-03-02 (×4): 200 mg via ORAL
  Filled 2021-02-28 (×5): qty 1

## 2021-02-28 NOTE — Progress Notes (Addendum)
      [address]       [place] [address]             [phone]       1 Day Post-Op Procedure(s) (LRB): VIDEO ASSISTED THORACOSCOPY (VATS) WITH  TALC PLEUADESIS and PLEURAL BIOPSY (Right) Subjective: Awake alert.  Had few episodes of nausea with vomiting earlier.  Denies any abdominal pain or shortness of breath. Oxygenating well on 4 L nasal cannula O2  Objective: Vital signs in last 24 hours: Temp:  [97.3 F (36.3 C)-98.6 F (37 C)] 97.7 F (36.5 C) (06/25 0800) Pulse Rate:  [69-86] 77 (06/25 0800) Cardiac Rhythm: Normal sinus rhythm (06/25 0700) Resp:  [10-20] 16 (06/25 0800) BP: (119-149)/(53-79) 134/68 (06/25 0800) SpO2:  [92 %-98 %] 96 % (06/25 0800) Arterial Line BP: (151-155)/(61-72) 152/72 (06/24 1630) Weight:  [104.3 kg] 104.3 kg (06/24 1123)     Intake/Output from previous day: 06/24 0701 - 06/25 0700 In: 300 [IV Piggyback:300] Out: 1900 [Urine:1450; Blood:50; Chest Tube:400] Intake/Output this shift: No intake/output data recorded.  General appearance: alert, cooperative, and mild distress Neurologic: intact Heart: regular rate and rhythm Lungs: Breath sounds are clear on the left, coarse on the right.  Right-sided Blake chest tube is secured.  Dressing is dry.  Pleural tube drained about 400 mL serosanguineous fluid since surgery. Abdomen: Soft and nontender Wound: Right chest minithoracotomy incision is covered with a dry dressing  Lab Results: Recent Labs    02/25/21 1334 02/28/21 0123  WBC 6.4 10.9*  HGB 12.9* 13.0  HCT 38.9* 39.1  PLT 227 217   BMET:  Recent Labs    02/25/21 1334 02/28/21 0123  NA 137 137  K 3.7 4.4  CL 104 101  CO2 23 30  GLUCOSE 120* 153*  BUN 11 9  CREATININE 0.85 0.96  CALCIUM 9.0 9.0    PT/INR:  Recent Labs    02/25/21 1334  LABPROT 12.9  INR 1.0   ABG    Component Value Date/Time   PHART 7.450 02/25/2021 1406   HCO3 26.5 02/25/2021 1406   O2SAT 94.5 02/25/2021 1406   CBG  (last 3)  No results for input(s): GLUCAP in the last 72 hours.  Assessment/Plan: S/P Procedure(s) (LRB): VIDEO ASSISTED THORACOSCOPY (VATS) WITH  TALC PLEUADESIS and PLEURAL BIOPSY (Right)  -Postop day 1 right minithoracotomy for drainage of pleural effusion, pleural biopsy, and talc pleurodesis.  At the time of surgery, he was noted to have pleural nodules suspicious for metastatic cancer, adenocarcinoma versus mesothelioma.  Path is pending.  Pain is controlled.  We will plan to keep the pleural tube to suction for another 24 hours to optimize pleurodesis.  Will add IV fluids until his appetite returns.  Mobilize.  Remove Foley catheter.   LOS: 1 day    Antony Odea, [place] [phone] 02/28/2021   Chart reviewed, patient examined, agree with above. 400 cc out from chest tube recorded this am. CXR looks good. Will continue to suction today.

## 2021-02-28 NOTE — Progress Notes (Signed)
Patient has not voided since foley cath was removed on day shift at 0900 per day shift RN, patient denies any fullness or discomfort in his bladder, bladder scan showed greater than 30cc, patient assisted to the bathroom but he did not void, patient stated that Dorene Sorrow MD notified, no new orders received, will continue to monitor patient.  Patient c/o stomach acid, he stated that he takes Tums at home and would like to have some, Bartle MD notified, verbal order received for Tums, will continue to monitor.

## 2021-03-01 ENCOUNTER — Inpatient Hospital Stay (HOSPITAL_COMMUNITY): Payer: Medicare HMO

## 2021-03-01 ENCOUNTER — Other Ambulatory Visit: Payer: Self-pay | Admitting: Medical

## 2021-03-01 LAB — CBC
HCT: 36.1 % — ABNORMAL LOW (ref 39.0–52.0)
Hemoglobin: 11.9 g/dL — ABNORMAL LOW (ref 13.0–17.0)
MCH: 30.5 pg (ref 26.0–34.0)
MCHC: 33 g/dL (ref 30.0–36.0)
MCV: 92.6 fL (ref 80.0–100.0)
Platelets: 200 10*3/uL (ref 150–400)
RBC: 3.9 MIL/uL — ABNORMAL LOW (ref 4.22–5.81)
RDW: 13.2 % (ref 11.5–15.5)
WBC: 11.9 10*3/uL — ABNORMAL HIGH (ref 4.0–10.5)
nRBC: 0 % (ref 0.0–0.2)

## 2021-03-01 LAB — COMPREHENSIVE METABOLIC PANEL
ALT: 11 U/L (ref 0–44)
AST: 13 U/L — ABNORMAL LOW (ref 15–41)
Albumin: 2.8 g/dL — ABNORMAL LOW (ref 3.5–5.0)
Alkaline Phosphatase: 70 U/L (ref 38–126)
Anion gap: 5 (ref 5–15)
BUN: 15 mg/dL (ref 8–23)
CO2: 29 mmol/L (ref 22–32)
Calcium: 8.7 mg/dL — ABNORMAL LOW (ref 8.9–10.3)
Chloride: 101 mmol/L (ref 98–111)
Creatinine, Ser: 1.01 mg/dL (ref 0.61–1.24)
GFR, Estimated: >60 mL/min (ref >60)
Glucose, Bld: 127 mg/dL — ABNORMAL HIGH (ref 70–99)
Potassium: 4.2 mmol/L (ref 3.5–5.1)
Sodium: 135 mmol/L (ref 135–145)
Total Bilirubin: 0.9 mg/dL (ref 0.3–1.2)
Total Protein: 5.7 g/dL — ABNORMAL LOW (ref 6.5–8.1)

## 2021-03-01 MED ORDER — TERAZOSIN HCL 5 MG PO CAPS
10.0000 mg | ORAL_CAPSULE | Freq: Every day | ORAL | Status: DC
  Administered 2021-03-01 – 2021-03-02 (×3): 10 mg via ORAL
  Filled 2021-03-01 (×3): qty 2

## 2021-03-01 NOTE — Progress Notes (Signed)
[name] has still been unable to void.  On last bladder scan done about 3 hours ago he had about 450 MLS of urine.  We will In-N-Out cath this afternoon.  If the urinary retention recurs, will we will replace the Foley catheter.  His terazosin has been resumed  Macarthur Critchley, PA-C

## 2021-03-01 NOTE — Progress Notes (Signed)
Mobility Specialist: Progress Note   03/01/21 1315  Mobility  Activity Ambulated in hall  Level of Assistance Contact guard assist, steadying assist  Assistive Device Front wheel walker  Distance Ambulated (ft) 420 ft  Mobility Ambulated with assistance in hallway  Mobility Response Tolerated well  Mobility performed by Mobility specialist  $Mobility charge 1 Mobility   Pre-Mobility 2 L/min [place]: 80 HR, 91% SpO2 Post-Mobility on RA: 88 HR, 90% SpO2  Pt asx throughout ambulation. Pt started ambulation on 2 L/min [place] but sats maintained 90% on RA after returning to room. Pt to BR and instructed to pull call string when he is finished, RN notified.   [facility] Chloie Loney Mobility Specialist Mobility Specialist Phone: [phone]

## 2021-03-01 NOTE — Progress Notes (Signed)
      [address]       [place] [address]             [phone]       2 Days Post-Op Procedure(s) (LRB): VIDEO ASSISTED THORACOSCOPY (VATS) WITH  TALC PLEUADESIS and PLEURAL BIOPSY (Right) Subjective: Awake alert.  Nausea resolved. Denies any abdominal pain or shortness of breath. Having trouble voiding urine.  Bladder schan this am showed only 254ml urine. Has long h/o prostate trouble.  Oxygenating well on 2 L nasal cannula O2  Objective: Vital signs in last 24 hours: Temp:  [98.1 F (36.7 C)-98.9 F (37.2 C)] 98.3 F (36.8 C) (06/26 0911) Pulse Rate:  [72-79] 79 (06/26 0911) Cardiac Rhythm: Normal sinus rhythm (06/26 0700) Resp:  [13-20] 13 (06/26 0911) BP: (96-118)/(45-60) 117/59 (06/26 0911) SpO2:  [91 %-98 %] 91 % (06/26 0911)     Intake/Output from previous day: 06/25 0701 - 06/26 0700 In: 1120 [P.O.:120; I.V.:1000] Out: 440 [Urine:200; Chest Tube:240] Intake/Output this shift: No intake/output data recorded.  General appearance: alert, cooperative, and mild distress Neurologic: intact Heart: regular rate and rhythm Lungs: Breath sounds are clear on the left, coarse on the right.  Right-sided Blake chest tube is secured.  Dressing is dry.  Pleural tube drained about 240 mL serosanguineous fluid past 24 hours, only 12ml for past 12 hours.  Abdomen: Soft and nontender Wound: Right chest minithoracotomy incision is covered with a dry dressing  Lab Results: Recent Labs    02/28/21 0123 03/01/21 0124  WBC 10.9* 11.9*  HGB 13.0 11.9*  HCT 39.1 36.1*  PLT 217 200    BMET:  Recent Labs    02/28/21 0123 03/01/21 0124  NA 137 135  K 4.4 4.2  CL 101 101  CO2 30 29  GLUCOSE 153* 127*  BUN 9 15  CREATININE 0.96 1.01  CALCIUM 9.0 8.7*     PT/INR:  No results for input(s): LABPROT, INR in the last 72 hours.  ABG    Component Value Date/Time   PHART 7.450 02/25/2021 1406   HCO3 26.5 02/25/2021 1406   O2SAT 94.5 02/25/2021 1406    CBG (last 3)  No results for input(s): GLUCAP in the last 72 hours.  Assessment/Plan: S/P Procedure(s) (LRB): VIDEO ASSISTED THORACOSCOPY (VATS) WITH  TALC PLEUADESIS and PLEURAL BIOPSY (Right)  -Postop day 2 right minithoracotomy for drainage of pleural effusion, pleural biopsy, and talc pleurodesis.  At the time of surgery, he was noted to have pleural nodules suspicious for metastatic cancer, adenocarcinoma versus mesothelioma.  Path is pending.  Pain is controlled.  Place the CT to water seal today. Encourage pulmonary hygiene.   -Nausea- resolved. D/C the IVF, diet as tolerated.  -H/O prostate hyperplasia with difficulty voiding. Resume his terazosin  Hopefully we can avoid replacing the Foley catheter.    LOS: 2 days    Malon Kindle [phone] 03/01/2021    Chart reviewed, patient examined, agree with above. CXR looks ok. 240 cc out from tube. Will put to water seal. Would leave in until minimal drainage with recurrent effusion and talc pleurodesis.

## 2021-03-01 NOTE — Progress Notes (Signed)
Patient unable to void. In and out cath done per order with 700 mL urine. Patient tolerated well.  Daymon Larsen, RN

## 2021-03-01 NOTE — Progress Notes (Signed)
Bladder scan shows 451 mL, patient tried to void but has no urge, denies discomfort. Paged PA. Waiting on response.  Daymon Larsen, RN

## 2021-03-01 NOTE — Progress Notes (Signed)
Bladder scan shows 263 cc, patient stated that he does not have an urge to void, denies any fullness or discomfort in his bladder, no sign of distress noted, will continue to monitor.

## 2021-03-01 NOTE — Progress Notes (Signed)
Patient has not voided nor has the urge to void since foley cath was removed at 0900. Bladder scan shows 237 mL.  Will continue to monitor.

## 2021-03-02 ENCOUNTER — Inpatient Hospital Stay (HOSPITAL_COMMUNITY): Payer: Medicare HMO

## 2021-03-02 LAB — CYTOLOGY - NON PAP

## 2021-03-02 MED ORDER — CALCIUM CARBONATE ANTACID 500 MG PO CHEW
1.0000 | CHEWABLE_TABLET | Freq: Three times a day (TID) | ORAL | Status: DC | PRN
  Administered 2021-03-02: 200 mg via ORAL
  Filled 2021-03-02: qty 1

## 2021-03-02 NOTE — Progress Notes (Signed)
Pt chest tube removed, Tolerated well, VSS, Will continue to monitor.   Chrisandra Carota, RN 03/02/2021 12:04 PM

## 2021-03-02 NOTE — Care Management Important Message (Signed)
Important Message  Patient Details  Name: Guy Evans MRN: [id number] Date of Birth: Nov 10, 1950   Medicare Important Message Given:  Yes     Guy Evans 03/02/2021, 3:08 PM

## 2021-03-02 NOTE — Progress Notes (Addendum)
      [address]       [place] [address]             [phone]      3 Days Post-Op Procedure(s) (LRB): VIDEO ASSISTED THORACOSCOPY (VATS) WITH  TALC PLEUADESIS and PLEURAL BIOPSY (Right)  Subjective:  No new complaints.  Continues to have issues with voiding, which are chronic for him.  He states he has weak muscles and is unable to void completely prior to admission.  Objective: Vital signs in last 24 hours: Temp:  [98.2 F (36.8 C)-98.8 F (37.1 C)] 98.2 F (36.8 C) (06/27 0806) Pulse Rate:  [72-86] 72 (06/27 0806) Cardiac Rhythm: Normal sinus rhythm (06/27 0752) Resp:  [13-18] 13 (06/27 0806) BP: (114-131)/(52-68) 131/61 (06/27 0806) SpO2:  [92 %-97 %] 95 % (06/27 0829)  Hemodynamic parameters for last 24 hours:    Intake/Output from previous day: 06/26 0701 - 06/27 0700 In: 720 [P.O.:720] Out: 800 [Urine:700; Chest Tube:100] Intake/Output this shift: Total I/O In: -  Out: 750 [Chest Tube:750]  General appearance: alert, cooperative, and no distress Heart: regular rate and rhythm Lungs: diminished breath sounds bibasilar Abdomen: soft, non-tender; bowel sounds normal; no masses,  no organomegaly Wound: clean and dry  Lab Results: Recent Labs    02/28/21 0123 03/01/21 0124  WBC 10.9* 11.9*  HGB 13.0 11.9*  HCT 39.1 36.1*  PLT 217 200   BMET:  Recent Labs    02/28/21 0123 03/01/21 0124  NA 137 135  K 4.4 4.2  CL 101 101  CO2 30 29  GLUCOSE 153* 127*  BUN 9 15  CREATININE 0.96 1.01  CALCIUM 9.0 8.7*    PT/INR: No results for input(s): LABPROT, INR in the last 72 hours. ABG    Component Value Date/Time   PHART 7.450 02/25/2021 1406   HCO3 26.5 02/25/2021 1406   O2SAT 94.5 02/25/2021 1406   CBG (last 3)  No results for input(s): GLUCAP in the last 72 hours.  Assessment/Plan: S/P Procedure(s) (LRB): VIDEO ASSISTED THORACOSCOPY (VATS) WITH  TALC PLEUADESIS and PLEURAL BIOPSY (Right)  S/P Mini Thoracotomy- drainage  of pleural effusion, pleural biopsy...output decreased down to 100 cc last 24 hours, will d/c chest tube H/O prostate hyperplasia- difficulty voiding, on Terazosin, has required I/O foley, hasn't voided yet this morning, monitor Dispo- patient stable, chest tube out today, repeat CXR in AM, continue terazosin, home in AM if CXR stable and he is voiding   LOS: 3 days    Ellwood Handler, PA-C 03/02/2021 Patient seen and examined, agree with above Path still pending  Remo Lipps C. Roxan Hockey, MD [facility] [phone]

## 2021-03-02 NOTE — Discharge Summary (Addendum)
Physician Discharge Summary  Patient ID: Guy Evans MRN: [id number] DOB/AGE: 1951/04/12 70 y.o.  Admit date: 02/27/2021 Discharge date: 03/03/2021  Admission Diagnoses: Recurrent pleural effusion  Patient Active Problem List   Diagnosis Date Noted   Recurrent right pleural effusion 02/11/2021   Status post thoracentesis    Former smoker 01/21/2021   Dyspnea 01/13/2021   Abnormal lung field 01/13/2021   Hypoxia 01/13/2021   Acute respiratory failure with hypoxia (HCC)    Pleural effusion    SOB (shortness of breath) 01/06/2021   Pulmonary emphysema ([place]) 01/06/2021   COPD (chronic obstructive pulmonary disease) ([place]) 01/06/2021   Screening for diabetes mellitus 11/05/2020   History of skin cancer 11/05/2020   Gastroesophageal reflux disease 11/05/2020   Plantar fasciitis 11/05/2020   Encounter for screening for vascular disease 11/05/2020   BMI 37.0-37.9, adult 10/20/2020   Advanced directives, counseling/discussion 10/20/2020   Loss of height 10/20/2020   Screening for osteoporosis 10/20/2020   Aortic atherosclerosis ([place]) 10/20/2020   Abnormal CT lung screening 10/20/2020   Abnormal CT of the chest 10/20/2020   Coronary artery disease involving native heart without angina pectoris 10/20/2020   Need for influenza vaccination 07/19/2018   Colonoscopy refused 04/27/2018   Benign prostatic hyperplasia 04/03/2018   Hyperlipidemia 01/17/2017   Hemorrhoids 01/17/2017   Encounter for health maintenance examination in adult 10/18/2016   Medicare annual wellness visit, subsequent 10/18/2016   Barrett's esophagus without dysplasia 10/18/2016   Polyp of colon 10/18/2016   Vaccine counseling 10/18/2016   Screen for colon cancer 10/18/2016   Family history of premature CAD 10/18/2016   Discharge Diagnoses:  Malignant mesothelioma Active Problems: Patient Active Problem List   Diagnosis Date Noted   S/P Mini Thoracotomy with drainage of pleural effusion, pleural biopsy, and  talc pleurodesis 02/27/2021   Recurrent right pleural effusion 02/11/2021   Status post thoracentesis    Former smoker 01/21/2021   Dyspnea 01/13/2021   Abnormal lung field 01/13/2021   Hypoxia 01/13/2021   Acute respiratory failure with hypoxia (HCC)    Pleural effusion    SOB (shortness of breath) 01/06/2021   Pulmonary emphysema ([place]) 01/06/2021   COPD (chronic obstructive pulmonary disease) ([place]) 01/06/2021   Screening for diabetes mellitus 11/05/2020   History of skin cancer 11/05/2020   Gastroesophageal reflux disease 11/05/2020   Plantar fasciitis 11/05/2020   Encounter for screening for vascular disease 11/05/2020   BMI 37.0-37.9, adult 10/20/2020   Advanced directives, counseling/discussion 10/20/2020   Loss of height 10/20/2020   Screening for osteoporosis 10/20/2020   Aortic atherosclerosis ([place]) 10/20/2020   Abnormal CT lung screening 10/20/2020   Abnormal CT of the chest 10/20/2020   Coronary artery disease involving native heart without angina pectoris 10/20/2020   Need for influenza vaccination 07/19/2018   Colonoscopy refused 04/27/2018   Benign prostatic hyperplasia 04/03/2018   Hyperlipidemia 01/17/2017   Hemorrhoids 01/17/2017   Encounter for health maintenance examination in adult 10/18/2016   Medicare annual wellness visit, subsequent 10/18/2016   Barrett's esophagus without dysplasia 10/18/2016   Polyp of colon 10/18/2016   Vaccine counseling 10/18/2016   Screen for colon cancer 10/18/2016   Family history of premature CAD 10/18/2016   Discharged Condition: good  History of Present Illness:  Guy Evans is a 70 year old man with a history of tobacco abuse, reflux, gastroparesis, degenerative joint disease, and BPH.  He has a 60-pack-year history of smoking prior to quitting in 1992.  He complains of shortness of breath with exertion.  This started earlier  in the year but only got significant in early May.  He saw Dr. Glade Lloyd.  They tried inhalers but  it did not help much.  That led to chest x-ray which showed a large right pleural effusion.  He had a thoracentesis in the cytology showed atypical cells suspicious for but not diagnostic of mesothelioma.  He then had a second thoracentesis with similar findings.  He did have symptomatic relief with drainage of the fluid.  CT of the chest showed some pleural nodularity and a nodule in the superior segment of the right lower lobe.  A PET/CT showed hypermetabolic pleural nodularity with no definite mediastinal adenopathy.  He was evaluated by Dr. Roxan Hockey at which time the patient denied any significant weight loss, cough, wheezing, chest pain.  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  It was felt the patient should undergo VATS procedure with drainage of pleural effusion, pleural biopsy, and possible talc pleurodesis.  Hospital Course:  Mr. Eimers presented to [facility] on 02/27/2021.  He was taken to the operating room and underwent Right Video Assisted Thoracoscopy with Drainage of Pleural Fluid, Pleural Biopsy, and Talc Pleurodesis.  He tolerated the procedure without difficulty, was extubated, and was taken to the PACU in stable condition and later transferred to 4 E.  Chest tube was kept on suction for 48 hours in order to optimize pleurodesis.  On the first postoperative day, he had nausea with vomiting and was unable to eat.  He was started on IV fluid at 75 mL/h.  The Foley catheter was removed.  On the second postoperative day, the chest tube was placed to waterseal.  His nausea had resolved and he resumed taking some p.o.'s.  The IV fluids were discontinued.  He developed urinary retention persisted for several hours prior to being started back on his terazosin.  In-N-Out cath was ordered at 3 PM on postop day 2 with plans to replace a Foley catheter if he has another  recurrence of retention.  The patient states this is a chronic issue for him and he never fully empties his bladder.  His chest tube output decreased and his tube was removed on 03/02/2021.  Follow up CXR showed no pneumothorax and small residual right pleural effusion.  He has been able to void his bladder without difficulty and did not require further I/O catheterization.  He is ambulating without difficulty.  His pathology remains pending.  His surgical incision is clean and dry.  He is medically stable for discharge home today.   Consults: None  Significant Diagnostic Studies: nuclear medicine:   1. Rind of hypermetabolic pleural thickening in the RIGHT hemithorax is consistent with pleural metastasis versus primary pleural malignancy. 2. Large RIGHT pleural effusion with passive atelectasis RIGHT lower lobe. 3. No clear bronchogenic carcinoma lesion within the lung parenchyma. Hypermetabolic nodule in the RIGHT lower lobe with mild activity. Favor metastasis. 4. No clear hypermetabolic mediastinal adenopathy. No hypermetabolic supraclavicular adenopathy. 5. No evidence distant metastatic disease. 6. Distended bladder with enlarged prostate gland. Query bladder outlet obstruction.   These results will be called to the ordering clinician or representative by the Radiologist Assistant, and communication documented in the PACS or [employer].  Treatments: surgery:   DATE OF PROCEDURE: 02/27/2021   PREOPERATIVE DIAGNOSES:  Right pleural effusion and pleural nodules.   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   PROCEDURE:  Right video-assisted thoracoscopy with pleural biopsy and talc pleurodesis.   SURGEON:  Modesto Charon, MD   ASSISTANT:  Ellwood Handler, PA-C   PATHOLOGY:  Discharge Exam: Blood pressure (!) 147/70, pulse 88, temperature 98.2 F (36.8 C), temperature source Oral, resp. rate 19, height 5\' 8"  (1.727 m),  weight 104.3 kg, SpO2 96 %.  General appearance: alert, cooperative, and no distress Heart: regular rate and rhythm Lungs: clear to auscultation bilaterally Abdomen: soft, non-tender; bowel sounds normal; no masses,  no organomegaly Wound: clean and dry  Discharge disposition: 01-Home or Self Care  Allergies as of 03/03/2021   No Known Allergies      Medication List     TAKE these medications    acetaminophen 500 MG tablet Commonly known as: TYLENOL Take 2 tablets (1,000 mg total) by mouth every 6 (six) hours as needed.   albuterol 108 (90 Base) MCG/ACT inhaler Commonly known as: VENTOLIN HFA Inhale 2 puffs into the lungs every 6 (six) hours as needed for wheezing or shortness of breath.   aspirin 81 MG EC tablet Take 81 mg by mouth daily.   atorvastatin 80 MG tablet Commonly known as: LIPITOR TAKE 1 TABLET BY MOUTH EVERY DAY   glucosamine-chondroitin 500-400 MG tablet Take 1 tablet by mouth daily.   omeprazole 20 MG capsule Commonly known as: PRILOSEC Take 20 mg by mouth daily.   oxyCODONE 5 MG immediate release tablet Commonly known as: Oxy IR/ROXICODONE Take 1-2 tablets (5-10 mg total) by mouth every 4 (four) hours as needed for moderate pain.   phenylephrine 1 % nasal spray Commonly known as: NEO-SYNEPHRINE Place 1 drop into both nostrils at bedtime as needed for congestion.   QC TUMERIC COMPLEX PO Take 450 mg by mouth daily.   terazosin 10 MG capsule Commonly known as: HYTRIN TAKE 1 CAPSULE BY MOUTH EVERY DAY What changed: how much to take   Trelegy Ellipta 100-62.5-25 MCG/INH Aepb Generic drug: Fluticasone-Umeclidin-Vilant Inhale 1 puff into the lungs daily.        Follow-up Information     [facility] [place] Follow up on 03/16/2021.   Specialty: Cardiothoracic Surgery Why: Appointment is at Contact information: [place], [place] [phone]                 Signed: Ellwood Handler, PA-C  03/03/2021, 8:44 AM

## 2021-03-02 NOTE — Progress Notes (Signed)
Mobility Specialist: Progress Note   03/02/21 1503  Mobility  Activity Ambulated in hall  Level of Assistance Independent  Assistive Device None  Distance Ambulated (ft) 430 ft  Mobility Ambulated independently in hallway  Mobility Response Tolerated well  Mobility performed by Mobility specialist  Bed Position Chair  $Mobility charge 1 Mobility   Pre-Mobility: 84 HR Post-Mobility: 100 HR, 91% SpO2  Pt asx throughout ambulation. Pt did not experience LOB or unsteadiness during ambulation. Pt back to recliner after walk with call bell in reach.   [facility] Shantil Vallejo Mobility Specialist Mobility Specialist Phone: [phone]

## 2021-03-02 NOTE — Telephone Encounter (Signed)
See pulmonology in july

## 2021-03-03 ENCOUNTER — Other Ambulatory Visit (HOSPITAL_COMMUNITY): Payer: Self-pay

## 2021-03-03 ENCOUNTER — Inpatient Hospital Stay (HOSPITAL_COMMUNITY): Payer: Medicare HMO

## 2021-03-03 DIAGNOSIS — J90 Pleural effusion, not elsewhere classified: Secondary | ICD-10-CM | POA: Diagnosis not present

## 2021-03-03 LAB — SURGICAL PATHOLOGY

## 2021-03-03 MED ORDER — ACETAMINOPHEN 500 MG PO TABS
1000.0000 mg | ORAL_TABLET | Freq: Four times a day (QID) | ORAL | 0 refills | Status: DC | PRN
  Filled 2021-03-03: qty 30, 4d supply, fill #0

## 2021-03-03 MED ORDER — OXYCODONE HCL 5 MG PO TABS
5.0000 mg | ORAL_TABLET | ORAL | 0 refills | Status: DC | PRN
  Filled 2021-03-03: qty 30, 5d supply, fill #0

## 2021-03-03 NOTE — Progress Notes (Signed)
Pt being D/C, Education has been completed, IV removed, Telebox returned, VSS.   Chrisandra Carota, RN

## 2021-03-03 NOTE — Progress Notes (Addendum)
      [address]       [place] [address]             [phone]      4 Days Post-Op Procedure(s) (LRB): VIDEO ASSISTED THORACOSCOPY (VATS) WITH  TALC PLEUADESIS and PLEURAL BIOPSY (Right)  Subjective:  Patient is a little tired after x ray.  Otherwise he has no complaints.  He was able to void yesterday and did not require further I/O cath.    Objective: Vital signs in last 24 hours: Temp:  [98.2 F (36.8 C)-98.7 F (37.1 C)] 98.2 F (36.8 C) (06/28 0342) Pulse Rate:  [65-87] 65 (06/28 0342) Cardiac Rhythm: Normal sinus rhythm (06/27 1903) Resp:  [13-20] 15 (06/28 0342) BP: (105-147)/(56-64) 126/58 (06/28 0342) SpO2:  [90 %-97 %] 96 % (06/28 0342)  Intake/Output from previous day: 06/27 0701 - 06/28 0700 In: -  Out: 750 [Chest Tube:750]   General appearance: alert, cooperative, and no distress Heart: regular rate and rhythm Lungs: clear to auscultation bilaterally Abdomen: soft, non-tender; bowel sounds normal; no masses,  no organomegaly Wound: clean and dry  Lab Results: Recent Labs    03/01/21 0124  WBC 11.9*  HGB 11.9*  HCT 36.1*  PLT 200   BMET:  Recent Labs    03/01/21 0124  NA 135  K 4.2  CL 101  CO2 29  GLUCOSE 127*  BUN 15  CREATININE 1.01  CALCIUM 8.7*    PT/INR: No results for input(s): LABPROT, INR in the last 72 hours. ABG    Component Value Date/Time   PHART 7.450 02/25/2021 1406   HCO3 26.5 02/25/2021 1406   O2SAT 94.5 02/25/2021 1406   CBG (last 3)  No results for input(s): GLUCAP in the last 72 hours.  Assessment/Plan: S/P Procedure(s) (LRB): VIDEO ASSISTED THORACOSCOPY (VATS) WITH  TALC PLEUADESIS and PLEURAL BIOPSY (Right)  S/P Mini Thoracotomy with drainage of pleural effusion, talc pleurodesis, pleural biopsy- CT removed yesterday, CXR free from pneumothorax, no significant effusion present... Pathology is pending H/O Prostate hyperplasia- on home Terazosin.Marland Kitchenvoiding now w/o issues, no further I/O  Cath required Dispo- patient stable, will d/c home today, Pathology remains pending   LOS: 4 days    Ellwood Handler, PA-C 03/03/2021 Patient seen and examined, agree with above Path still pending Home today F/u with Dr. Julien Nordmann and return to our office for wound check and CXR  Remo Lipps C. Roxan Hockey, MD [facility] [phone]

## 2021-03-03 NOTE — Progress Notes (Signed)
Discharge instructions (including medications) discussed with and copy provided to patient/caregiver 

## 2021-03-05 ENCOUNTER — Inpatient Hospital Stay: Payer: Medicare HMO | Admitting: Internal Medicine

## 2021-03-05 ENCOUNTER — Telehealth: Payer: Self-pay | Admitting: *Deleted

## 2021-03-05 ENCOUNTER — Other Ambulatory Visit: Payer: Self-pay

## 2021-03-05 ENCOUNTER — Inpatient Hospital Stay: Payer: Medicare HMO

## 2021-03-05 VITALS — BP 147/74 | HR 83 | Temp 99.4°F | Resp 20 | Ht 68.0 in | Wt 229.4 lb

## 2021-03-05 DIAGNOSIS — C450 Mesothelioma of pleura: Secondary | ICD-10-CM | POA: Insufficient documentation

## 2021-03-05 DIAGNOSIS — Z9889 Other specified postprocedural states: Secondary | ICD-10-CM

## 2021-03-05 DIAGNOSIS — I2510 Atherosclerotic heart disease of native coronary artery without angina pectoris: Secondary | ICD-10-CM | POA: Diagnosis not present

## 2021-03-05 DIAGNOSIS — Z8711 Personal history of peptic ulcer disease: Secondary | ICD-10-CM | POA: Diagnosis not present

## 2021-03-05 DIAGNOSIS — J439 Emphysema, unspecified: Secondary | ICD-10-CM | POA: Diagnosis not present

## 2021-03-05 DIAGNOSIS — N400 Enlarged prostate without lower urinary tract symptoms: Secondary | ICD-10-CM | POA: Diagnosis not present

## 2021-03-05 DIAGNOSIS — Z86010 Personal history of colonic polyps: Secondary | ICD-10-CM | POA: Diagnosis not present

## 2021-03-05 DIAGNOSIS — Z79899 Other long term (current) drug therapy: Secondary | ICD-10-CM | POA: Diagnosis not present

## 2021-03-05 DIAGNOSIS — Z5111 Encounter for antineoplastic chemotherapy: Secondary | ICD-10-CM | POA: Insufficient documentation

## 2021-03-05 DIAGNOSIS — Z7982 Long term (current) use of aspirin: Secondary | ICD-10-CM | POA: Diagnosis not present

## 2021-03-05 DIAGNOSIS — M2550 Pain in unspecified joint: Secondary | ICD-10-CM | POA: Diagnosis not present

## 2021-03-05 DIAGNOSIS — I700 Atherosclerosis of aorta: Secondary | ICD-10-CM | POA: Diagnosis not present

## 2021-03-05 DIAGNOSIS — R350 Frequency of micturition: Secondary | ICD-10-CM | POA: Diagnosis not present

## 2021-03-05 DIAGNOSIS — K219 Gastro-esophageal reflux disease without esophagitis: Secondary | ICD-10-CM | POA: Diagnosis not present

## 2021-03-05 DIAGNOSIS — J910 Malignant pleural effusion: Secondary | ICD-10-CM | POA: Diagnosis not present

## 2021-03-05 LAB — CMP (CANCER CENTER ONLY)
ALT: 50 U/L — ABNORMAL HIGH (ref 0–44)
AST: 46 U/L — ABNORMAL HIGH (ref 15–41)
Albumin: 3 g/dL — ABNORMAL LOW (ref 3.5–5.0)
Alkaline Phosphatase: 94 U/L (ref 38–126)
Anion gap: 9 (ref 5–15)
BUN: 13 mg/dL (ref 8–23)
CO2: 28 mmol/L (ref 22–32)
Calcium: 9.3 mg/dL (ref 8.9–10.3)
Chloride: 104 mmol/L (ref 98–111)
Creatinine: 0.89 mg/dL (ref 0.61–1.24)
GFR, Estimated: >60 mL/min (ref >60)
Glucose, Bld: 109 mg/dL — ABNORMAL HIGH (ref 70–99)
Potassium: 3.7 mmol/L (ref 3.5–5.1)
Sodium: 141 mmol/L (ref 135–145)
Total Bilirubin: 0.4 mg/dL (ref 0.3–1.2)
Total Protein: 6.9 g/dL (ref 6.5–8.1)

## 2021-03-05 LAB — CBC WITH DIFFERENTIAL (CANCER CENTER ONLY)
Abs Immature Granulocytes: 0.02 10*3/uL (ref 0.00–0.07)
Basophils Absolute: 0.1 10*3/uL (ref 0.0–0.1)
Basophils Relative: 1 %
Eosinophils Absolute: 0.5 10*3/uL (ref 0.0–0.5)
Eosinophils Relative: 8 %
HCT: 35.5 % — ABNORMAL LOW (ref 39.0–52.0)
Hemoglobin: 12 g/dL — ABNORMAL LOW (ref 13.0–17.0)
Immature Granulocytes: 0 %
Lymphocytes Relative: 14 %
Lymphs Abs: 1 10*3/uL (ref 0.7–4.0)
MCH: 30.7 pg (ref 26.0–34.0)
MCHC: 33.8 g/dL (ref 30.0–36.0)
MCV: 90.8 fL (ref 80.0–100.0)
Monocytes Absolute: 0.9 10*3/uL (ref 0.1–1.0)
Monocytes Relative: 12 %
Neutro Abs: 4.4 10*3/uL (ref 1.7–7.7)
Neutrophils Relative %: 65 %
Platelet Count: 274 10*3/uL (ref 150–400)
RBC: 3.91 MIL/uL — ABNORMAL LOW (ref 4.22–5.81)
RDW: 13.2 % (ref 11.5–15.5)
WBC Count: 6.8 10*3/uL (ref 4.0–10.5)
nRBC: 0 % (ref 0.0–0.2)

## 2021-03-05 MED ORDER — LIDOCAINE-PRILOCAINE 2.5-2.5 % EX CREA: TOPICAL_CREAM | CUTANEOUS | 0 refills | Status: DC

## 2021-03-05 MED ORDER — DEXAMETHASONE 4 MG PO TABS: ORAL_TABLET | ORAL | 1 refills | Status: DC

## 2021-03-05 MED ORDER — PROCHLORPERAZINE MALEATE 10 MG PO TABS: 10.0000 mg | ORAL_TABLET | Freq: Four times a day (QID) | ORAL | 0 refills | Status: DC | PRN

## 2021-03-05 MED ORDER — FOLIC ACID 1 MG PO TABS
1.0000 mg | ORAL_TABLET | Freq: Every day | ORAL | 4 refills | Status: DC
Start: 2021-03-05 — End: 2021-07-23

## 2021-03-05 MED ORDER — CYANOCOBALAMIN 1000 MCG/ML IJ SOLN
1000.0000 ug | Freq: Once | INTRAMUSCULAR | Status: AC
  Administered 2021-03-05: 1000 ug via INTRAMUSCULAR

## 2021-03-05 MED ORDER — CYANOCOBALAMIN 1000 MCG/ML IJ SOLN
INTRAMUSCULAR | Status: AC
  Filled 2021-03-05: qty 1

## 2021-03-05 NOTE — Telephone Encounter (Signed)
Per Dr. Julien Nordmann, I called patient to get him scheduled to see Dr. Julien Nordmann. I called patient but was unable to reach. I did leave vm message with my name and phone number to call.

## 2021-03-05 NOTE — Progress Notes (Signed)
START ON PATHWAY REGIMEN - Mesothelioma   Bevacizumab 15 mg/kg IV + Carboplatin AUC=5 IV + Pemetrexed 500 mg/m2 IV q21 Days:   A cycle is every 21 days:     Pemetrexed      Carboplatin      Bevacizumab-xxxx   **Always confirm dose/schedule in your pharmacy ordering system**  Bevacizumab 15 mg/kg IV D1 q21 Days:   A cycle is every 21 days:     Bevacizumab-xxxx   **Always confirm dose/schedule in your pharmacy ordering system**  Patient Characteristics: Newly Diagnosed, Preoperative or Nonsurgical Candidate (Clinical Staging), Pleurectomy/Decortication or Other Surgical Procedure Not Planned, First Line Therapeutic Status: Newly Diagnosed, Preoperative or Nonsurgical Candidate (Clinical Staging) AJCC T Category: cT2 AJCC N Category: cN0 AJCC 8 Stage Grouping: IB AJCC M Category: cM0  Intent of Therapy: Non-Curative / Palliative Intent, Discussed with Patient

## 2021-03-05 NOTE — Telephone Encounter (Signed)
Mr. Elko called me back. I gave him an appt for him to be seen today with Dr. Julien Nordmann. He verbalized understanding of appt

## 2021-03-05 NOTE — Progress Notes (Signed)
[facility] [phone]   [phone]  OFFICE PROGRESS NOTE  Carlena Hurl, PA-C [place] [address]  DIAGNOSIS: Stage Ib (T2, N0, M0) unresectable right malignant pleural mesothelioma.,  Epithelioid type diagnosed in June 2022.  PRIOR THERAPY: Status post right VATS with pleural biopsy and talc pleurodesis under the care of Dr. Roxan Hockey on 02/27/2021.  CURRENT THERAPY: Systemic chemotherapy with carboplatin for AUC of 5, Alimta 500 Mg/M2 and Avastin 15 mg/KG every 3 weeks.  First dose March 23, 2021.  INTERVAL HISTORY: Guy Evans 70 y.o. male returns to the clinic today for follow-up visit accompanied by his wife.  The patient is feeling fine today with no concerning complaints except for mild pain on the right side of the chest after the VATS and talc pleurodesis.  He is expected to have the Pleurx removed on July 12 by Dr. Roxan Hockey.  He denied having any shortness of breath, cough or hemoptysis.  He denied having any fever or chills.  He has no nausea, vomiting, diarrhea or constipation.  He has no headache or visual changes.  He has no weight loss or night sweats.  The patient underwent right VATS with pleural biopsy and talc pleurodesis under the care of Dr. Roxan Hockey last week and the final pathology was consistent with malignant pleural mesothelioma, epithelioid type.  He is here today for evaluation and discussion of his treatment options.  MEDICAL HISTORY: Past Medical History:  Diagnosis Date   Arthritis    hands   Enlarged prostate    takes Terazosin daily   Gastroparesis    GERD (gastroesophageal reflux disease)    occasionally but no meds required   H/O cardiovascular stress test 10/2019   nuclear, normal, Dr. Fransico Him   History of colon polyps    History of gastric ulcer    Joint pain    knees and ankles   Urinary frequency     ALLERGIES:  has No Known Allergies.  MEDICATIONS:  Current  Outpatient Medications  Medication Sig Dispense Refill   acetaminophen (TYLENOL) 500 MG tablet Take 2 tablets (1,000 mg total) by mouth every 6 (six) hours as needed. 30 tablet 0   albuterol (VENTOLIN HFA) 108 (90 Base) MCG/ACT inhaler Inhale 2 puffs into the lungs every 6 (six) hours as needed for wheezing or shortness of breath. 8 g 0   aspirin 81 MG EC tablet Take 81 mg by mouth daily.     atorvastatin (LIPITOR) 80 MG tablet TAKE 1 TABLET BY MOUTH EVERY DAY 30 tablet 0   Fluticasone-Umeclidin-Vilant (TRELEGY ELLIPTA) 100-62.5-25 MCG/INH AEPB Inhale 1 puff into the lungs daily. 28 each 5   glucosamine-chondroitin 500-400 MG tablet Take 1 tablet by mouth daily.     omeprazole (PRILOSEC) 20 MG capsule Take 20 mg by mouth daily.     oxyCODONE (OXY IR/ROXICODONE) 5 MG immediate release tablet Take 1-2 tablets (5-10 mg total) by mouth every 4 (four) hours as needed for moderate pain. 30 tablet 0   phenylephrine (NEO-SYNEPHRINE) 1 % nasal spray Place 1 drop into both nostrils at bedtime as needed for congestion.     terazosin (HYTRIN) 10 MG capsule TAKE 1 CAPSULE BY MOUTH EVERY DAY 90 capsule 0   Turmeric (QC TUMERIC COMPLEX PO) Take 450 mg by mouth daily.     No current facility-administered medications for this visit.    SURGICAL HISTORY:  Past Surgical History:  Procedure Laterality Date   CHOLECYSTECTOMY  N/A 11/19/2013   Procedure: LAPAROSCOPIC CHOLECYSTECTOMY;  Surgeon: Harl Bowie, MD;  Location: [facility];  Service: General;  Laterality: N/A;   COLONOSCOPY     DIAGNOSTIC LAPAROSCOPY     lap chole   ESOPHAGOGASTRODUODENOSCOPY     skin spots removed and tested     done in the office   THORACENTESIS N/A 01/14/2021   Procedure: THORACENTESIS;  Surgeon: Collene Gobble, MD;  Location: [facility];  Service: Cardiopulmonary;  Laterality: N/A;   THORACENTESIS N/A 01/23/2021   Procedure: THORACENTESIS;  Surgeon: Collene Gobble, MD;  Location: [facility] ENDOSCOPY;  Service: Cardiopulmonary;   Laterality: N/A;   VIDEO ASSISTED THORACOSCOPY (VATS) W/TALC PLEUADESIS Right 02/27/2021   Procedure: VIDEO ASSISTED THORACOSCOPY (VATS) WITH  TALC PLEUADESIS and PLEURAL BIOPSY;  Surgeon: Melrose Nakayama, MD;  Location: [facility];  Service: Thoracic;  Laterality: Right;    REVIEW OF SYSTEMS:  Constitutional: positive for fatigue Eyes: negative Ears, nose, mouth, throat, and face: negative Respiratory: positive for pleurisy/chest pain Cardiovascular: negative Gastrointestinal: negative Genitourinary:negative Integument/breast: negative Hematologic/lymphatic: negative Musculoskeletal:negative Neurological: negative Behavioral/Psych: negative Endocrine: negative Allergic/Immunologic: negative   PHYSICAL EXAMINATION: General appearance: alert, cooperative, fatigued, and no distress Head: Normocephalic, without obvious abnormality, atraumatic Neck: no adenopathy, no JVD, supple, symmetrical, trachea midline, and thyroid not enlarged, symmetric, no tenderness/mass/nodules Lymph nodes: Cervical, supraclavicular, and axillary nodes normal. Resp: diminished breath sounds RLL and dullness to percussion RLL Back: symmetric, no curvature. ROM normal. No CVA tenderness. Cardio: regular rate and rhythm, S1, S2 normal, no murmur, click, rub or gallop GI: soft, non-tender; bowel sounds normal; no masses,  no organomegaly Extremities: extremities normal, atraumatic, no cyanosis or edema Neurologic: Alert and oriented X 3, normal strength and tone. Normal symmetric reflexes. Normal coordination and gait  ECOG PERFORMANCE STATUS: 1 - Symptomatic but completely ambulatory  Blood pressure (!) 147/74, pulse 83, temperature 99.4 F (37.4 C), temperature source Tympanic, resp. rate 20, height 5\' 8"  (1.727 m), weight 229 lb 6.4 oz (104.1 kg), SpO2 96 %.  LABORATORY DATA: Lab Results  Component Value Date   WBC 11.9 (H) 03/01/2021   HGB 11.9 (L) 03/01/2021   HCT 36.1 (L) 03/01/2021   MCV 92.6  03/01/2021   PLT 200 03/01/2021      Chemistry      Component Value Date/Time   NA 135 03/01/2021 0124   NA 141 10/20/2020 1030   K 4.2 03/01/2021 0124   CL 101 03/01/2021 0124   CO2 29 03/01/2021 0124   BUN 15 03/01/2021 0124   BUN 13 10/20/2020 1030   CREATININE 1.01 03/01/2021 0124   CREATININE 1.02 02/11/2021 0902   CREATININE 1.03 10/18/2016 1027      Component Value Date/Time   CALCIUM 8.7 (L) 03/01/2021 0124   ALKPHOS 70 03/01/2021 0124   AST 13 (L) 03/01/2021 0124   AST 11 (L) 02/11/2021 0902   ALT 11 03/01/2021 0124   ALT 8 02/11/2021 0902   BILITOT 0.9 03/01/2021 0124   BILITOT 0.5 02/11/2021 0902       RADIOGRAPHIC STUDIES: DG Chest 2 View  Result Date: 03/03/2021 CLINICAL DATA:  Chest tube placement. EXAM: CHEST - 2 VIEW COMPARISON:  03/02/2021.  PET-CT 02/05/2021. FINDINGS: Heart size stable. Low lung volumes with persistent right base atelectasis/infiltrate and small right pleural effusion. No significant interim change. Questionable pulmonary nodule right apex. Previously identified right chest tube not definitely identified on today's exam. No acute bony abnormality. IMPRESSION: 1. Previous identified right chest tube not definitely  identified on today's exam. No pneumothorax. 2. Low lung volumes with persistent right base atelectasis/infiltrate and small right pleural effusion. 3.  Questionable pulmonary nodule right apex. Electronically Signed   By: Marcello Moores  Register   On: 03/03/2021 07:58   DG Chest 2 View  Result Date: 02/26/2021 CLINICAL DATA:  Pre right chest biopsy evaluation. EXAM: CHEST - 2 VIEW COMPARISON:  01/23/2021.  PET-CT dated 02/05/2021. FINDINGS: No significant change in a moderate-sized right pleural effusion and adjacent right basilar atelectasis. The left lung remains clear. The pulmonary vasculature is mildly prominent in the cardiac silhouette is mildly enlarged. Atheromatous aortic arch calcifications. Mild thoracic spine degenerative  changes. IMPRESSION: 1. Stable moderate-sized right pleural effusion with mild right basilar atelectasis. 2. Mild cardiomegaly and mild pulmonary vascular congestion. Electronically Signed   By: Claudie Revering M.D.   On: 02/26/2021 17:25   NM PET Image Initial (PI) Skull Base To Thigh  Result Date: 02/06/2021 CLINICAL DATA:  Initial treatment strategy for non-small cell lung cancer. EXAM: NUCLEAR MEDICINE PET SKULL BASE TO THIGH TECHNIQUE: 12.1 mCi F-18 FDG was injected intravenously. Full-ring PET imaging was performed from the skull base to thigh after the radiotracer. CT data was obtained and used for attenuation correction and anatomic localization. Fasting blood glucose: 101 mg/dl COMPARISON:  CT 01/14/2021 FINDINGS: Mediastinal blood pool activity: SUV max 2.3 Liver activity: SUV max NA NECK: No hypermetabolic lymph nodes in the neck. Incidental CT findings: none CHEST: Rind of hypermetabolic pleural thickening within the RIGHT hemithorax. Example pleural thickening in the anterior RIGHT middle lobe measuring 10 mm with SUV max equal 9.0 (image 105). Hypermetabolic pleural thickening in the RIGHT upper lobe along the mediastinal pleural surface measures 10 mm with SUV max equal 6.6 on image 73 There is a large RIGHT effusion with passive atelectasis of the RIGHT lower lobe. No clear hypermetabolic primary lung within the lung parenchyma. There is a nodule in the superior segment of the RIGHT lower lobe with SUV max equal 3.6 image 80. Paraseptal emphysema the upper lobes. No hypermetabolic mediastinal adenopathy. No supraclavicular adenopathy. Activity beneath the carina is favored pleural activity in azygoesophageal recess. Incidental CT findings: none ABDOMEN/PELVIS: No abnormal hypermetabolic activity within the liver, pancreas, adrenal glands, or spleen. No hypermetabolic lymph nodes in the abdomen or pelvis. Incidental CT findings: Bladder is distended and the prostate is enlarged. SKELETON: No focal  hypermetabolic activity to suggest skeletal metastasis. Incidental CT findings: none IMPRESSION: 1. Rind of hypermetabolic pleural thickening in the RIGHT hemithorax is consistent with pleural metastasis versus primary pleural malignancy. 2. Large RIGHT pleural effusion with passive atelectasis RIGHT lower lobe. 3. No clear bronchogenic carcinoma lesion within the lung parenchyma. Hypermetabolic nodule in the RIGHT lower lobe with mild activity. Favor metastasis. 4. No clear hypermetabolic mediastinal adenopathy. No hypermetabolic supraclavicular adenopathy. 5. No evidence distant metastatic disease. 6. Distended bladder with enlarged prostate gland. Query bladder outlet obstruction. These results will be called to the ordering clinician or representative by the Radiologist Assistant, and communication documented in the PACS or [employer]. Electronically Signed   By: Suzy Bouchard M.D.   On: 02/06/2021 08:33   DG CHEST PORT 1 VIEW  Result Date: 03/02/2021 CLINICAL DATA:  Status post right-sided VATS with talc pleurodesis. Chest tube in place. EXAM: PORTABLE CHEST 1 VIEW COMPARISON:  Prior chest x-ray yesterday 03/01/2021 FINDINGS: Stable cardiac and mediastinal contours. Right-sided chest tube remains in good position. No pneumothorax. Residual pleural thickening versus small effusion and associated right basilar atelectasis.  Mild vascular congestion without overt edema. No acute osseous abnormality. IMPRESSION: Expected postprocedural changes with residual atelectasis in the right middle and lower lobes. Well-positioned chest tube without evidence of pneumothorax. Mild pulmonary vascular congestion without edema. Electronically Signed   By: Jacqulynn Cadet M.D.   On: 03/02/2021 07:48   DG CHEST PORT 1 VIEW  Result Date: 03/01/2021 CLINICAL DATA:  Cough, shortness of breath right-sided chest tube, pleural effusion EXAM: PORTABLE CHEST 1 VIEW COMPARISON:  02/28/2021 FINDINGS: Small right pleural  effusion and associated atelectasis or consolidation. Right-sided chest tubes remain in position. No pneumothorax. The left lung is normally aerated. Cardiomegaly. IMPRESSION: 1. Small right pleural effusion and associated atelectasis or consolidation. Right-sided chest tubes remain in position. No pneumothorax. 2.  Cardiomegaly. Electronically Signed   By: Eddie Candle M.D.   On: 03/01/2021 10:21   DG CHEST PORT 1 VIEW  Result Date: 02/28/2021 CLINICAL DATA:  Dyspnea EXAM: PORTABLE CHEST 1 VIEW COMPARISON:  02/27/2021 FINDINGS: Right basilar chest tube is unchanged. Small right pleural effusion and associated right basilar atelectasis or infiltrate is stable. Left lung is clear. No pneumothorax. Cardiac size within normal limits. Pulmonary vascularity is normal. IMPRESSION: Stable right basilar chest tube. Unchanged small right pleural effusion and associated right basilar atelectasis or infiltrate. Electronically Signed   By: Fidela Salisbury MD   On: 02/28/2021 07:06   DG CHEST PORT 1 VIEW  Result Date: 02/27/2021 CLINICAL DATA:  Status post thoracotomy EXAM: PORTABLE CHEST 1 VIEW COMPARISON:  02/25/2021, CT 01/14/2021 FINDINGS: Interim placement of right-sided chest drainage catheter projecting over the medial right base. Decreased right pleural effusion with small residual basilar and lateral pleural disease. Residual airspace disease at the right middle lobe and right base. Stable cardiomediastinal silhouette. Aortic atherosclerosis. No pneumothorax IMPRESSION: 1. Interim placement of right-sided chest drainage tube with decreased right pleural effusion. 2. Small residual right pleural effusion or thickening with residual airspace disease at the right middle lobe and right base Electronically Signed   By: Donavan Foil M.D.   On: 02/27/2021 18:35    ASSESSMENT AND PLAN: This is a very pleasant 70 years old white male recently diagnosed with unresectable stage IB (T2, N0, M0) malignant pleural  mesothelioma, epithelioid type diagnosed and June 2022 and presented with extensive involvement of the right hemithorax. The patient is status post VATS with pleural biopsies and talc pleurodesis in June 2022 under the care of Dr. Roxan Hockey. I had a lengthy discussion with the patient and his wife today about his current disease stage, prognosis and treatment options.  I discussed with the patient his systemic treatment options.  He is currently not a good surgical candidate for resection based on the evaluation of Dr. Roxan Hockey. I gave him the option of continuous observation and monitoring versus palliative systemic treatment with either immunotherapy with ipilimumab and nivolumab versus systemic chemotherapy with carboplatin for AUC of 5, Alimta 500 Mg/M2 and Avastin 15 mg/KG every 3 weeks. I also explained to the patient that the immunotherapy is more effective on the sarcomatoid subtype of the pleural mesothelioma but still a good option for his condition. The patient is interested in proceeding with the systemic chemotherapy and he is expected to start the first cycle of chemotherapy with carboplatin, Alimta and Avastin on March 23, 2021 after he returns back from his beach vacation the week of July 11. I discussed with the patient the adverse effect of this treatment including but not limited to alopecia, myelosuppression, nausea and vomiting, peripheral  neuropathy, liver or renal dysfunction as well as the hemorrhagic adverse effects from Avastin. He will have a chemotherapy education class before the first dose of his treatment. The patient will receive vitamin B12 injection today. I will refer him to interventional radiology for Port-A-Cath placement. I will call his pharmacy with prescription for folic acid 1 mg p.o. daily, Compazine 10 mg p.o. every 6 hours as needed for nausea in addition to Decadron 4 mg p.o. twice daily the day before, day of and day after the chemotherapy every 3 weeks  in addition to Emla cream to be applied to the Port-A-Cath site before treatment. The patient will come back for follow-up visit with the first day of his treatment. He was advised to call immediately if he has any other concerning symptoms in the interval. The patient voices understanding of current disease status and treatment options and is in agreement with the current care plan.  All questions were answered. The patient knows to call the clinic with any problems, questions or concerns. We can certainly see the patient much sooner if necessary. The total time spent in the appointment was 55 minutes.  Disclaimer: This note was dictated with voice recognition software. Similar sounding words can inadvertently be transcribed and may not be corrected upon review.

## 2021-03-10 ENCOUNTER — Other Ambulatory Visit: Payer: Self-pay | Admitting: Student

## 2021-03-11 ENCOUNTER — Other Ambulatory Visit: Payer: Self-pay

## 2021-03-11 ENCOUNTER — Telehealth: Payer: Self-pay | Admitting: Internal Medicine

## 2021-03-11 ENCOUNTER — Encounter: Payer: Self-pay | Admitting: *Deleted

## 2021-03-11 ENCOUNTER — Ambulatory Visit (HOSPITAL_COMMUNITY)
Admission: RE | Admit: 2021-03-11 | Discharge: 2021-03-11 | Disposition: A | Discharge status: Home / Self Care | Payer: Medicare HMO | Source: Ambulatory Visit | Attending: Internal Medicine | Admitting: Internal Medicine

## 2021-03-11 ENCOUNTER — Other Ambulatory Visit: Payer: Self-pay | Admitting: Internal Medicine

## 2021-03-11 ENCOUNTER — Encounter (HOSPITAL_COMMUNITY): Payer: Self-pay

## 2021-03-11 DIAGNOSIS — K3184 Gastroparesis: Secondary | ICD-10-CM | POA: Diagnosis not present

## 2021-03-11 DIAGNOSIS — C450 Mesothelioma of pleura: Secondary | ICD-10-CM | POA: Diagnosis not present

## 2021-03-11 DIAGNOSIS — Z79899 Other long term (current) drug therapy: Secondary | ICD-10-CM | POA: Insufficient documentation

## 2021-03-11 DIAGNOSIS — Z87891 Personal history of nicotine dependence: Secondary | ICD-10-CM | POA: Insufficient documentation

## 2021-03-11 DIAGNOSIS — Z452 Encounter for adjustment and management of vascular access device: Secondary | ICD-10-CM | POA: Diagnosis not present

## 2021-03-11 DIAGNOSIS — K219 Gastro-esophageal reflux disease without esophagitis: Secondary | ICD-10-CM | POA: Diagnosis not present

## 2021-03-11 DIAGNOSIS — Z7982 Long term (current) use of aspirin: Secondary | ICD-10-CM | POA: Diagnosis not present

## 2021-03-11 DIAGNOSIS — C459 Mesothelioma, unspecified: Secondary | ICD-10-CM | POA: Diagnosis not present

## 2021-03-11 HISTORY — PX: IR IMAGING GUIDED PORT INSERTION: IMG5740

## 2021-03-11 MED ORDER — MIDAZOLAM HCL 2 MG/2ML IJ SOLN
INTRAMUSCULAR | Status: AC
  Filled 2021-03-11: qty 4

## 2021-03-11 MED ORDER — LIDOCAINE-EPINEPHRINE (PF) 1 %-1:200000 IJ SOLN
INTRAMUSCULAR | Status: AC | PRN
  Administered 2021-03-11: 20 mL

## 2021-03-11 MED ORDER — MIDAZOLAM HCL 2 MG/2ML IJ SOLN
INTRAMUSCULAR | Status: AC | PRN
  Administered 2021-03-11 (×2): 1 mg via INTRAVENOUS
  Administered 2021-03-11 (×2): 0.5 mg via INTRAVENOUS

## 2021-03-11 MED ORDER — BUPIVACAINE HCL (PF) 0.5 % IJ SOLN
INTRAMUSCULAR | Status: AC
  Filled 2021-03-11: qty 30

## 2021-03-11 MED ORDER — LIDOCAINE-EPINEPHRINE 1 %-1:100000 IJ SOLN
INTRAMUSCULAR | Status: AC
  Filled 2021-03-11: qty 1

## 2021-03-11 MED ORDER — FENTANYL CITRATE (PF) 100 MCG/2ML IJ SOLN
INTRAMUSCULAR | Status: AC
  Filled 2021-03-11: qty 4

## 2021-03-11 MED ORDER — SODIUM CHLORIDE 0.9 % IV SOLN: INTRAVENOUS | Status: DC

## 2021-03-11 MED ORDER — FENTANYL CITRATE (PF) 100 MCG/2ML IJ SOLN
INTRAMUSCULAR | Status: AC | PRN
  Administered 2021-03-11 (×2): 50 ug via INTRAVENOUS
  Administered 2021-03-11 (×2): 25 ug via INTRAVENOUS

## 2021-03-11 MED ORDER — HEPARIN SOD (PORK) LOCK FLUSH 100 UNIT/ML IV SOLN
INTRAVENOUS | Status: AC
  Filled 2021-03-11: qty 5

## 2021-03-11 MED ORDER — BUPIVACAINE HCL (PF) 0.5 % IJ SOLN
INTRAMUSCULAR | Status: AC | PRN
  Administered 2021-03-11: 10 mL

## 2021-03-11 NOTE — Progress Notes (Signed)
I followed up on Guy Evans's schedule and did not see his infusion set up yet. I contacted scheduling and was notified they will have this scheduled by the end of the day.

## 2021-03-11 NOTE — Telephone Encounter (Signed)
Scheduled per los. Called and spoke with patient. Patient was at an appt but said it was ok to go ahead and schedule appts and he will check his mychart

## 2021-03-11 NOTE — Progress Notes (Signed)
Chief Complaint: Patient was seen in consultation today for mesothelioma  Referring Physician(s): Mohamed,Mohamed  Supervising Physician: Mir, Biochemist, clinical  Patient Status: [facility] - Out-pt  History of Present Illness: Guy Evans is a 70 y.o. male, former smoker, with past medical history of GERD, gastroparesis, degenerative joint disease, BPH and recent diagnosis of right mesothelioma  s/p VATS with talc pleurodesis 02/27/21 with Dr. Roxan Hockey.  Patient now with plans for upcoming chemotherapy and is in need of durable venous access.  He has been referred to IR for Port-A-Cath placement.   Guy Evans presents to [facility] Radiology today in his usual state of health.  Denies fever, chills, nausea, vomiting, abdominal pain, cough, shortness of breath worse than baseline, dysuria.  He has no known infections today and reports he is recovering well from his prior surgery.   He has been NPO today.   Past Medical History:  Diagnosis Date   Arthritis    hands   Enlarged prostate    takes Terazosin daily   Gastroparesis    GERD (gastroesophageal reflux disease)    occasionally but no meds required   H/O cardiovascular stress test 10/2019   nuclear, normal, Dr. Fransico Him   History of colon polyps    History of gastric ulcer    Joint pain    knees and ankles   Urinary frequency     Past Surgical History:  Procedure Laterality Date   CHOLECYSTECTOMY N/A 11/19/2013   Procedure: LAPAROSCOPIC CHOLECYSTECTOMY;  Surgeon: Harl Bowie, MD;  Location: [place];  Service: General;  Laterality: N/A;   COLONOSCOPY     DIAGNOSTIC LAPAROSCOPY     lap chole   ESOPHAGOGASTRODUODENOSCOPY     skin spots removed and tested     done in the office   THORACENTESIS N/A 01/14/2021   Procedure: THORACENTESIS;  Surgeon: Collene Gobble, MD;  Location: [place];  Service: Cardiopulmonary;  Laterality: N/A;   THORACENTESIS N/A 01/23/2021   Procedure: THORACENTESIS;  Surgeon: Collene Gobble, MD;   Location: [facility] ENDOSCOPY;  Service: Cardiopulmonary;  Laterality: N/A;   VIDEO ASSISTED THORACOSCOPY (VATS) W/TALC PLEUADESIS Right 02/27/2021   Procedure: VIDEO ASSISTED THORACOSCOPY (VATS) WITH  TALC PLEUADESIS and PLEURAL BIOPSY;  Surgeon: Melrose Nakayama, MD;  Location: [place];  Service: Thoracic;  Laterality: Right;    Allergies: Patient has no known allergies.  Medications: Prior to Admission medications   Medication Sig Start Date End Date Taking? Authorizing Provider  acetaminophen (TYLENOL) 500 MG tablet Take 2 tablets (1,000 mg total) by mouth every 6 (six) hours as needed. 03/03/21  Yes Barrett, Erin R, PA-C  aspirin 81 MG EC tablet Take 81 mg by mouth daily.   Yes [provider]  atorvastatin (LIPITOR) 80 MG tablet TAKE 1 TABLET BY MOUTH EVERY DAY 02/20/21  Yes Turner, Eber Hong, MD  Fluticasone-Umeclidin-Vilant (TRELEGY ELLIPTA) 100-62.5-25 MCG/INH AEPB Inhale 1 puff into the lungs daily. 02/05/21  Yes Tysinger, Camelia Eng, PA-C  folic acid (FOLVITE) 1 MG tablet Take 1 tablet (1 mg total) by mouth daily. 03/05/21  Yes Curt Bears, MD  glucosamine-chondroitin 500-400 MG tablet Take 1 tablet by mouth daily.   Yes [provider]  omeprazole (PRILOSEC) 20 MG capsule Take 20 mg by mouth daily. 12/09/10  Yes [provider]  oxyCODONE (OXY IR/ROXICODONE) 5 MG immediate release tablet Take 1-2 tablets (5-10 mg total) by mouth every 4 (four) hours as needed for moderate pain. 03/03/21  Yes Barrett, Erin R, PA-C  phenylephrine (  NEO-SYNEPHRINE) 1 % nasal spray Place 1 drop into both nostrils at bedtime as needed for congestion.   Yes [provider]  terazosin (HYTRIN) 10 MG capsule TAKE 1 CAPSULE BY MOUTH EVERY DAY 12/26/20  Yes Tysinger, Camelia Eng, PA-C  Turmeric (QC TUMERIC COMPLEX PO) Take 450 mg by mouth daily.   Yes [provider]  albuterol (VENTOLIN HFA) 108 (90 Base) MCG/ACT inhaler Inhale 2 puffs into the lungs every 6 (six) hours as needed  for wheezing or shortness of breath. 03/02/21   Henson, Vickie L, NP-C  dexamethasone (DECADRON) 4 MG tablet 4 mg p.o. twice daily the day before, day of and day after chemotherapy every 3 weeks 03/05/21   Curt Bears, MD  lidocaine-prilocaine (EMLA) cream Apply to the Port-A-Cath site 30-60-minute before chemotherapy. 03/05/21   Curt Bears, MD  prochlorperazine (COMPAZINE) 10 MG tablet Take 1 tablet (10 mg total) by mouth every 6 (six) hours as needed for nausea or vomiting. 03/05/21   Curt Bears, MD     Family History  Problem Relation Age of Onset   Dementia Mother    Heart disease Father 16       MI at 54yo, angina   Obesity Father    Cancer Neg Hx    Stroke Neg Hx    Diabetes Neg Hx     Social History   Socioeconomic History   Marital status: Married    Spouse name: Not on file   Number of children: Not on file   Years of education: Not on file   Highest education level: Not on file  Occupational History   Not on file  Tobacco Use   Smoking status: Former    Packs/day: 2.00    Years: 30.00    Pack years: 60.00    Types: Cigarettes    Quit date: 30    Years since quitting: 30.5   Smokeless tobacco: Never   Tobacco comments:    quit smoking around 1993  Vaping Use   Vaping Use: Never used  Substance and Sexual Activity   Alcohol use: Yes    Alcohol/week: 12.0 standard drinks    Types: 6 Glasses of wine, 6 Shots of liquor per week    Comment: none since gallbladder issues   Drug use: Yes    Types: Marijuana   Sexual activity: Yes  Other Topics Concern   Not on file  Social History Narrative   Not exercising.  Married.   Has 2 children, 2 grandchildren, one child [place], [place], one in [place], [place].   Prior worked as Pension scheme manager.  As of 10/2020   Social Determinants of Health   Financial Resource Strain: Not on file  Food Insecurity: Not on file  Transportation Needs: Not on file  Physical Activity: Not on file  Stress: Not on file   Social Connections: Not on file     Review of Systems: A 12 point ROS discussed and pertinent positives are indicated in the HPI above.  All other systems are negative.  Review of Systems  Constitutional:  Negative for fatigue and fever.  Respiratory:  Negative for cough and shortness of breath.   Cardiovascular:  Negative for chest pain.  Gastrointestinal:  Negative for abdominal pain, nausea and vomiting.  Musculoskeletal:  Negative for back pain.  Psychiatric/Behavioral:  Negative for behavioral problems and confusion.    Vital Signs: BP 138/72   Pulse 73   Temp 97.9 F (36.6 C) (Oral)   Ht 5'  8" (1.727 m)   Wt 229 lb 8 oz (104.1 kg)   SpO2 96%   BMI 34.90 kg/m   Physical Exam Vitals and nursing note reviewed.  Constitutional:      General: He is not in acute distress.    Appearance: Normal appearance. He is not ill-appearing.  HENT:     Mouth/Throat:     Mouth: Mucous membranes are moist.     Pharynx: Oropharynx is clear.  Neck:     Comments: No neck or anterior chest incisions Cardiovascular:     Rate and Rhythm: Normal rate and regular rhythm.  Pulmonary:     Effort: Pulmonary effort is normal. No respiratory distress.     Breath sounds: Normal breath sounds.  Chest:    Musculoskeletal:     Cervical back: Normal range of motion and neck supple.  Skin:    General: Skin is warm and dry.  Neurological:     Mental Status: He is alert.     MD Evaluation Airway: WNL Heart: WNL Abdomen: WNL Chest/ Lungs: WNL ASA  Classification: 3 Mallampati/Airway Score: Two   Imaging: DG Chest 2 View  Result Date: 03/03/2021 CLINICAL DATA:  Chest tube placement. EXAM: CHEST - 2 VIEW COMPARISON:  03/02/2021.  PET-CT 02/05/2021. FINDINGS: Heart size stable. Low lung volumes with persistent right base atelectasis/infiltrate and small right pleural effusion. No significant interim change. Questionable pulmonary nodule right apex. Previously identified right chest tube  not definitely identified on today's exam. No acute bony abnormality. IMPRESSION: 1. Previous identified right chest tube not definitely identified on today's exam. No pneumothorax. 2. Low lung volumes with persistent right base atelectasis/infiltrate and small right pleural effusion. 3.  Questionable pulmonary nodule right apex. Electronically Signed   By: Marcello Moores  Register   On: 03/03/2021 07:58   DG Chest 2 View  Result Date: 02/26/2021 CLINICAL DATA:  Pre right chest biopsy evaluation. EXAM: CHEST - 2 VIEW COMPARISON:  01/23/2021.  PET-CT dated 02/05/2021. FINDINGS: No significant change in a moderate-sized right pleural effusion and adjacent right basilar atelectasis. The left lung remains clear. The pulmonary vasculature is mildly prominent in the cardiac silhouette is mildly enlarged. Atheromatous aortic arch calcifications. Mild thoracic spine degenerative changes. IMPRESSION: 1. Stable moderate-sized right pleural effusion with mild right basilar atelectasis. 2. Mild cardiomegaly and mild pulmonary vascular congestion. Electronically Signed   By: Claudie Revering M.D.   On: 02/26/2021 17:25   DG CHEST PORT 1 VIEW  Result Date: 03/02/2021 CLINICAL DATA:  Status post right-sided VATS with talc pleurodesis. Chest tube in place. EXAM: PORTABLE CHEST 1 VIEW COMPARISON:  Prior chest x-ray yesterday 03/01/2021 FINDINGS: Stable cardiac and mediastinal contours. Right-sided chest tube remains in good position. No pneumothorax. Residual pleural thickening versus small effusion and associated right basilar atelectasis. Mild vascular congestion without overt edema. No acute osseous abnormality. IMPRESSION: Expected postprocedural changes with residual atelectasis in the right middle and lower lobes. Well-positioned chest tube without evidence of pneumothorax. Mild pulmonary vascular congestion without edema. Electronically Signed   By: Jacqulynn Cadet M.D.   On: 03/02/2021 07:48   DG CHEST PORT 1 VIEW  Result  Date: 03/01/2021 CLINICAL DATA:  Cough, shortness of breath right-sided chest tube, pleural effusion EXAM: PORTABLE CHEST 1 VIEW COMPARISON:  02/28/2021 FINDINGS: Small right pleural effusion and associated atelectasis or consolidation. Right-sided chest tubes remain in position. No pneumothorax. The left lung is normally aerated. Cardiomegaly. IMPRESSION: 1. Small right pleural effusion and associated atelectasis or consolidation. Right-sided chest tubes  remain in position. No pneumothorax. 2.  Cardiomegaly. Electronically Signed   By: Eddie Candle M.D.   On: 03/01/2021 10:21   DG CHEST PORT 1 VIEW  Result Date: 02/28/2021 CLINICAL DATA:  Dyspnea EXAM: PORTABLE CHEST 1 VIEW COMPARISON:  02/27/2021 FINDINGS: Right basilar chest tube is unchanged. Small right pleural effusion and associated right basilar atelectasis or infiltrate is stable. Left lung is clear. No pneumothorax. Cardiac size within normal limits. Pulmonary vascularity is normal. IMPRESSION: Stable right basilar chest tube. Unchanged small right pleural effusion and associated right basilar atelectasis or infiltrate. Electronically Signed   By: Fidela Salisbury MD   On: 02/28/2021 07:06   DG CHEST PORT 1 VIEW  Result Date: 02/27/2021 CLINICAL DATA:  Status post thoracotomy EXAM: PORTABLE CHEST 1 VIEW COMPARISON:  02/25/2021, CT 01/14/2021 FINDINGS: Interim placement of right-sided chest drainage catheter projecting over the medial right base. Decreased right pleural effusion with small residual basilar and lateral pleural disease. Residual airspace disease at the right middle lobe and right base. Stable cardiomediastinal silhouette. Aortic atherosclerosis. No pneumothorax IMPRESSION: 1. Interim placement of right-sided chest drainage tube with decreased right pleural effusion. 2. Small residual right pleural effusion or thickening with residual airspace disease at the right middle lobe and right base Electronically Signed   By: Donavan Foil M.D.    On: 02/27/2021 18:35    Labs:  CBC: Recent Labs    02/25/21 1334 02/28/21 0123 03/01/21 0124 03/05/21 1327  WBC 6.4 10.9* 11.9* 6.8  HGB 12.9* 13.0 11.9* 12.0*  HCT 38.9* 39.1 36.1* 35.5*  PLT 227 217 200 274    COAGS: Recent Labs    02/25/21 1334  INR 1.0  APTT 27    BMP: Recent Labs    10/20/20 1030 01/13/21 1130 02/25/21 1334 02/28/21 0123 03/01/21 0124 03/05/21 1327  NA 141   < > 137 137 135 141  K 4.7   < > 3.7 4.4 4.2 3.7  CL 102   < > 104 101 101 104  CO2 24   < > 23 30 29 28   GLUCOSE 101*   < > 120* 153* 127* 109*  BUN 13   < > 11 9 15 13   CALCIUM 9.2   < > 9.0 9.0 8.7* 9.3  CREATININE 1.06   < > 0.85 0.96 1.01 0.89  GFRNONAA 71   < > >60 >60 >60 >60  GFRAA 82  --   --   --   --   --    < > = values in this interval not displayed.    LIVER FUNCTION TESTS: Recent Labs    02/11/21 0902 02/25/21 1334 03/01/21 0124 03/05/21 1327  BILITOT 0.5 0.7 0.9 0.4  AST 11* 18 13* 46*  ALT 8 15 11  50*  ALKPHOS 94 91 70 94  PROT 6.8 6.5 5.7* 6.9  ALBUMIN 3.3* 3.5 2.8* 3.0*    TUMOR MARKERS: No results for input(s): AFPTM, CEA, CA199, CHROMGRNA in the last 8760 hours.  Assessment and Plan: Patient with past medical history of GERD, gastroparesis presents with complaint of recent diagnosis of mesothelioma with upcoming plans for chemotherapy.  No planned radiation at this time.   IR consulted for Port-A-Cath placement at the request of Dr. Julien Nordmann. Case reviewed by Dr. Dwaine Gale who approves patient for procedure.  Patient presents today in their usual state of health.  He has been NPO and is not currently on blood thinners.   Risks and benefits of image guided port-a-catheter  placement was discussed with the patient including, but not limited to bleeding, infection, pneumothorax, or fibrin sheath development and need for additional procedures.  All of the patient's questions were answered, patient is agreeable to proceed. Consent signed and in  chart.  Thank you for this interesting consult.  I greatly enjoyed meeting Hyman Crossan and look forward to participating in their care.  A copy of this report was sent to the requesting provider on this date.  Electronically Signed: Docia Barrier, PA 03/11/2021, 12:03 PM   I spent a total of  30 Minutes   in face to face in clinical consultation, greater than 50% of which was counseling/coordinating care for mesothelioma.

## 2021-03-11 NOTE — Procedures (Signed)
Interventional Radiology Procedure Note ° °Procedure: Single Lumen Power Port Placement   ° °Access:  Right internal jugular vein ° °Findings: Catheter tip positioned at cavoatrial junction. Port is ready for immediate use.  ° °Complications: None ° °EBL: < 10 mL ° °Recommendations:  °- Ok to shower in 24 hours °- Do not submerge for 7 days °- Routine line care  ° ° °Dennise Raabe, MD ° ° ° °

## 2021-03-13 ENCOUNTER — Other Ambulatory Visit: Payer: Self-pay | Admitting: Thoracic Surgery (Cardiothoracic Vascular Surgery)

## 2021-03-13 DIAGNOSIS — J90 Pleural effusion, not elsewhere classified: Secondary | ICD-10-CM

## 2021-03-16 ENCOUNTER — Ambulatory Visit
Admission: RE | Admit: 2021-03-16 | Discharge: 2021-03-16 | Disposition: A | Discharge status: Home / Self Care | Payer: Medicare HMO | Source: Ambulatory Visit | Attending: Thoracic Surgery (Cardiothoracic Vascular Surgery) | Admitting: Thoracic Surgery (Cardiothoracic Vascular Surgery)

## 2021-03-16 ENCOUNTER — Ambulatory Visit (INDEPENDENT_AMBULATORY_CARE_PROVIDER_SITE_OTHER): Payer: Self-pay | Admitting: Surgical

## 2021-03-16 ENCOUNTER — Other Ambulatory Visit: Payer: Self-pay

## 2021-03-16 ENCOUNTER — Ambulatory Visit
Admission: RE | Admit: 2021-03-16 | Discharge: 2021-03-16 | Disposition: A | Discharge status: Home / Self Care | Payer: Self-pay | Source: Ambulatory Visit

## 2021-03-16 VITALS — BP 119/71 | HR 70 | Resp 20 | Ht 68.0 in | Wt 229.8 lb

## 2021-03-16 DIAGNOSIS — J90 Pleural effusion, not elsewhere classified: Secondary | ICD-10-CM | POA: Diagnosis not present

## 2021-03-16 DIAGNOSIS — Z9889 Other specified postprocedural states: Secondary | ICD-10-CM

## 2021-03-16 NOTE — Patient Instructions (Addendum)
Routine activity advancement discussed with patient

## 2021-03-16 NOTE — Progress Notes (Signed)
Pharmacist Chemotherapy Monitoring - Initial Assessment    Anticipated start date: 7/18/   The following has been reviewed per standard work regarding the patient's treatment regimen: The patient's diagnosis, treatment plan and drug doses, and organ/hematologic function Lab orders and baseline tests specific to treatment regimen  The treatment plan start date, drug sequencing, and pre-medications Prior authorization status  Patient's documented medication list, including drug-drug interaction screen and prescriptions for anti-emetics and supportive care specific to the treatment regimen The drug concentrations, fluid compatibility, administration routes, and timing of the medications to be used The patient's access for treatment and lifetime cumulative dose history, if applicable  The patient's medication allergies and previous infusion related reactions, if applicable   Changes made to treatment plan:  N/A  Follow up needed:  Pending authorization for treatment    Larene Beach, [place], 03/16/2021  12:06 PM

## 2021-03-16 NOTE — Progress Notes (Signed)
[address]       [place] [address]             [phone]      Guy Evans  Medical Record #[id number] Date of Birth: 1950-10-09  Referring: Curt Bears, MD Primary Care: Carlena Hurl, PA-C Primary Cardiologist: Fransico Him, MD   Chief Complaint:   POST OP FOLLOW UP  Operative Report   DATE OF PROCEDURE: 02/27/2021   PREOPERATIVE DIAGNOSES:  Right pleural effusion and pleural nodules.   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   PROCEDURE:  Right video-assisted thoracoscopy with pleural biopsy and talc pleurodesis.   SURGEON:  Modesto Charon, MD   ASSISTANT:  Ellwood Handler, PA   ANESTHESIA:  General.     DIAGNOSIS: Stage Ib (T2, N0, M0) unresectable right malignant pleural mesothelioma.,  Epithelioid type diagnosed in June 2022.   PRIOR THERAPY: Status post right VATS with pleural biopsy and talc pleurodesis under the care of Dr. Roxan Hockey on 02/27/2021.   CURRENT THERAPY: Systemic chemotherapy with carboplatin for AUC of 5, Alimta 500 Mg/M2 and Avastin 15 mg/KG every 3 weeks.  First dose March 23, 2021.   History of Present Illness:    The patient is a 70 year old male status post the above described procedure with pathology also noted above.  He is seen in the office on today's date and routine postsurgical follow-up.  He reports overall he is having only minor discomfort with majority from where his Port-A-Cath was placed.  There is minimal incisional discomfort from the VATS procedure.  He does take pain medication approximately once a day.  He denies any significant shortness of breath or activity limitations.  He has had no difficulties with incision healing and denies fevers, chills or other constitutional symptoms.  He did have some mild early constipation but this has resolved.  He has been seen by Dr. Earlie Server and is scheduled for first dose of chemotherapy to be  July 18 with plan for 3-week intervals.      Past Medical History:  Diagnosis Date   Arthritis    hands   Enlarged prostate    takes Terazosin daily   Gastroparesis    GERD (gastroesophageal reflux disease)    occasionally but no meds required   H/O cardiovascular stress test 10/2019   nuclear, normal, Dr. Fransico Him   History of colon polyps    History of gastric ulcer    Joint pain    knees and ankles   Urinary frequency      Social History   Tobacco Use  Smoking Status Former   Packs/day: 2.00   Years: 30.00   Pack years: 60.00   Types: Cigarettes   Quit date: 1992   Years since quitting: 30.5  Smokeless Tobacco Never  Tobacco Comments   quit smoking around 1993    Social History   Substance and Sexual Activity  Alcohol Use Yes   Alcohol/week: 12.0 standard drinks   Types: 6 Glasses of wine, 6 Shots of liquor per week   Comment: none since gallbladder issues     No Known Allergies  Current Outpatient Medications  Medication Sig Dispense Refill   acetaminophen (TYLENOL) 500 MG tablet Take 2 tablets (1,000 mg total) by mouth every 6 (six) hours as needed. 30 tablet 0   albuterol (VENTOLIN HFA) 108 (90 Base) MCG/ACT inhaler Inhale 2 puffs into the lungs every 6 (six) hours as needed for  wheezing or shortness of breath. 8 g 0   aspirin 81 MG EC tablet Take 81 mg by mouth daily.     atorvastatin (LIPITOR) 80 MG tablet TAKE 1 TABLET BY MOUTH EVERY DAY 30 tablet 0   dexamethasone (DECADRON) 4 MG tablet 4 mg p.o. twice daily the day before, day of and day after chemotherapy every 3 weeks 40 tablet 1   Fluticasone-Umeclidin-Vilant (TRELEGY ELLIPTA) 100-62.5-25 MCG/INH AEPB Inhale 1 puff into the lungs daily. 28 each 5   folic acid (FOLVITE) 1 MG tablet Take 1 tablet (1 mg total) by mouth daily. 30 tablet 4   glucosamine-chondroitin 500-400 MG tablet Take 1 tablet by mouth daily.     lidocaine-prilocaine (EMLA) cream Apply to the Port-A-Cath site 30-60-minute  before chemotherapy. 30 g 0   omeprazole (PRILOSEC) 20 MG capsule Take 20 mg by mouth daily.     oxyCODONE (OXY IR/ROXICODONE) 5 MG immediate release tablet Take 1-2 tablets (5-10 mg total) by mouth every 4 (four) hours as needed for moderate pain. 30 tablet 0   phenylephrine (NEO-SYNEPHRINE) 1 % nasal spray Place 1 drop into both nostrils at bedtime as needed for congestion.     prochlorperazine (COMPAZINE) 10 MG tablet Take 1 tablet (10 mg total) by mouth every 6 (six) hours as needed for nausea or vomiting. 30 tablet 0   terazosin (HYTRIN) 10 MG capsule TAKE 1 CAPSULE BY MOUTH EVERY DAY 90 capsule 0   Turmeric (QC TUMERIC COMPLEX PO) Take 450 mg by mouth daily.     No current facility-administered medications for this visit.       Physical Exam: BP 119/71 (BP Location: Right Arm, Patient Position: Sitting, Cuff Size: Normal)   Pulse 70   Resp 20   Ht 5\' 8"  (1.727 m)   Wt 229 lb 12.8 oz (104.2 kg)   SpO2 92% Comment: RA  BMI 34.94 kg/m   General appearance: alert, cooperative, and no distress Heart: regular rate and rhythm, no rub, and no murmur Lungs: Coarse and somewhat diminished breath sounds in the right base Abdomen: Benign exam Extremities: No edema or calf tenderness Wound: Incisions well-healed without evidence of infection   Diagnostic Studies & Laboratory data:     Recent Radiology Findings:   DG Chest 2 View  Result Date: 03/16/2021 CLINICAL DATA:  Recurrent right pleural effusion. EXAM: CHEST - 2 VIEW COMPARISON:  PA and lateral chest 03/03/2021. FINDINGS: New right IJ approach Port-A-Cath tip projects in the lower superior vena cava. Small right pleural effusion and basilar airspace opacity are unchanged. The left lung is clear. Heart size is normal. Aortic atherosclerosis. IMPRESSION: No change in a small right pleural effusion basilar airspace opacity. Right IJ approach Port-A-Cath tip projects in the lower superior vena cava Electronically Signed   By: Inge Rise M.D.   On: 03/16/2021 14:14      Recent Lab Findings: Lab Results  Component Value Date   WBC 6.8 03/05/2021   HGB 12.0 (L) 03/05/2021   HCT 35.5 (L) 03/05/2021   PLT 274 03/05/2021   GLUCOSE 109 (H) 03/05/2021   CHOL 115 10/20/2020   TRIG 67 10/20/2020   HDL 46 10/20/2020   LDLCALC 55 10/20/2020   ALT 50 (H) 03/05/2021   AST 46 (H) 03/05/2021   NA 141 03/05/2021   K 3.7 03/05/2021   CL 104 03/05/2021   CREATININE 0.89 03/05/2021   BUN 13 03/05/2021   CO2 28 03/05/2021   INR 1.0 02/25/2021  HGBA1C 5.4 04/04/2018      Assessment / Plan: 70 year old male with VATS confirmed diagnosis of mesothelioma, stage Ib with plans as described above for multiagent chemotherapy to begin July 18 via Port-A-Cath.  Chest x-ray was reviewed and report as described above.  I made no changes to his current medication regimen.  We will see the patient again on a as needed basis for any surgically related needs or at request but not actively follow at this time.      Medication Changes: No orders of the defined types were placed in this encounter.     John Giovanni, PA-C  03/16/2021 2:20 PM

## 2021-03-17 ENCOUNTER — Ambulatory Visit: Payer: Medicare HMO | Admitting: Emergency Medicine

## 2021-03-17 ENCOUNTER — Encounter: Payer: Self-pay | Admitting: Emergency Medicine

## 2021-03-17 DIAGNOSIS — C450 Mesothelioma of pleura: Secondary | ICD-10-CM

## 2021-03-17 DIAGNOSIS — J449 Chronic obstructive pulmonary disease, unspecified: Secondary | ICD-10-CM

## 2021-03-17 NOTE — Assessment & Plan Note (Signed)
He has been maintained on Trelegy, is not sure how much he benefits.  Minimal albuterol use.  I would be okay with a trial off maintenance BD to see if he can tolerate but I would like to defer until after he is stable post chemotherapy.  We can talk about the timing of a break from Trelegy as next office visit.  We will continue Trelegy 1 inhalation once daily for now.  Rinse and gargle after using.  Depending on how you are doing next visit we will talk about possibly temporarily stopping it. Keep albuterol available to use 2 puffs if needed for shortness of breath, chest tightness, wheezing. Follow with Dr Lamonte Sakai in 6 months or sooner if you have any problems

## 2021-03-17 NOTE — Progress Notes (Signed)
Subjective:    Patient ID: Guy Evans, male    DOB: 07-Dec-1950, 70 y.o.   MRN: [id number]  HPI 70 year old gentleman with a history of former tobacco use (40 pack years), hypertension, GERD, newly diagnosed COPD on Trelegy.  I saw him in the hospital last week when he was admitted for dyspnea and a large right pleural effusion.  We performed thoracentesis on 5/11, showed an exudate, cytology showed mitotic figures and atypical cells suggestive of mesothelial origin but not enough to definitively establish malignancy.  His shortness of breath did improve postthoracentesis.  Right lower lobe superior segmental nodule identified as below.  Presents now for further evaluation.  He reports his dyspnea is.  He remains on Trelegy, has albuterol which he uses  CT chest performed 01/14/2021 after thoracentesis reviewed by me, showed decrease in size of his large right pleural effusion, continued pleural fluid and right lower lobe atelectasis, right lower lobe superior segmental nodule 1.4 cm with some associated right-sided pleural thickening.  ROV 02/12/21 --Mr. Levada Dy is 70, former tobacco user with COPD on Trelegy.  He is undergone thoracentesis x2 for right pleural effusion that showed atypical cells and mitotic figures but was nondiagnostic for malignancy.  He has an associated right-sided pulmonary nodule in the superior segment.  PET scan performed 02/06/2021 reviewed, shows a rind of hypermetabolic pleural thickening in the right hemithorax, enlarged right pleural effusion with passive atelectasis of the right lower lobe, no clear hypermetabolic primary lung lesion.  His superior segmental right lower lobe nodule has an SUV max of 3.6.  No evidence of distant disease.  ROV 03/17/21 --pleasant 71 year old gentleman, former smoker with COPD.  Treated with Trelegy.  I have been following him for persistent/recurrent pleural effusions with associated pleural thickening and pleural nodular disease.  His  thoracenteses were exudative but only showed atypical cells and were nondiagnostic.  I referred him to Dr. Roxan Hockey for VATS >> pathology confirmed a pleural mesothelioma, epithelioid type.  He has been seen by Dr. Julien Nordmann and is starting systemic chemotherapy, planned first dose 03/23/2021.  His breathing is doing well, although he notes that he is not doing much activity. Tolerating Trelegy, unsure whether it is helping him much. He is off O2.    Review of Systems As per HPI  Past Medical History:  Diagnosis Date   Arthritis    hands   Enlarged prostate    takes Terazosin daily   Gastroparesis    GERD (gastroesophageal reflux disease)    occasionally but no meds required   H/O cardiovascular stress test 10/2019   nuclear, normal, Dr. Fransico Him   History of colon polyps    History of gastric ulcer    Joint pain    knees and ankles   Urinary frequency      Family History  Problem Relation Age of Onset   Dementia Mother    Heart disease Father 34       MI at 74yo, angina   Obesity Father    Cancer Neg Hx    Stroke Neg Hx    Diabetes Neg Hx      Social History   Socioeconomic History   Marital status: Married    Spouse name: Not on file   Number of children: Not on file   Years of education: Not on file   Highest education level: Not on file  Occupational History   Not on file  Tobacco Use   Smoking status: Former  Packs/day: 2.00    Years: 30.00    Pack years: 60.00    Types: Cigarettes    Quit date: 75    Years since quitting: 30.5   Smokeless tobacco: Never   Tobacco comments:    quit smoking around 1993  Vaping Use   Vaping Use: Never used  Substance and Sexual Activity   Alcohol use: Yes    Alcohol/week: 12.0 standard drinks    Types: 6 Glasses of wine, 6 Shots of liquor per week    Comment: none since gallbladder issues   Drug use: Yes    Types: Marijuana   Sexual activity: Yes  Other Topics Concern   Not on file  Social History  Narrative   Not exercising.  Married.   Has 2 children, 2 grandchildren, one child [place], [place], one in [place], [place].   Prior worked as Pension scheme manager.  As of 10/2020   Social Determinants of Health   Financial Resource Strain: Not on file  Food Insecurity: Not on file  Transportation Needs: Not on file  Physical Activity: Not on file  Stress: Not on file  Social Connections: Not on file  Intimate Partner Violence: Not on file     No Known Allergies   Outpatient Medications Prior to Visit  Medication Sig Dispense Refill   acetaminophen (TYLENOL) 500 MG tablet Take 2 tablets (1,000 mg total) by mouth every 6 (six) hours as needed. 30 tablet 0   albuterol (VENTOLIN HFA) 108 (90 Base) MCG/ACT inhaler Inhale 2 puffs into the lungs every 6 (six) hours as needed for wheezing or shortness of breath. 8 g 0   aspirin 81 MG EC tablet Take 81 mg by mouth daily.     atorvastatin (LIPITOR) 80 MG tablet TAKE 1 TABLET BY MOUTH EVERY DAY 30 tablet 0   dexamethasone (DECADRON) 4 MG tablet 4 mg p.o. twice daily the day before, day of and day after chemotherapy every 3 weeks 40 tablet 1   Fluticasone-Umeclidin-Vilant (TRELEGY ELLIPTA) 100-62.5-25 MCG/INH AEPB Inhale 1 puff into the lungs daily. 28 each 5   folic acid (FOLVITE) 1 MG tablet Take 1 tablet (1 mg total) by mouth daily. 30 tablet 4   glucosamine-chondroitin 500-400 MG tablet Take 1 tablet by mouth daily.     lidocaine-prilocaine (EMLA) cream Apply to the Port-A-Cath site 30-60-minute before chemotherapy. 30 g 0   omeprazole (PRILOSEC) 20 MG capsule Take 20 mg by mouth daily.     oxyCODONE (OXY IR/ROXICODONE) 5 MG immediate release tablet Take 1-2 tablets (5-10 mg total) by mouth every 4 (four) hours as needed for moderate pain. 30 tablet 0   phenylephrine (NEO-SYNEPHRINE) 1 % nasal spray Place 1 drop into both nostrils at bedtime as needed for congestion.     prochlorperazine (COMPAZINE) 10 MG tablet Take 1 tablet (10 mg total) by mouth  every 6 (six) hours as needed for nausea or vomiting. 30 tablet 0   terazosin (HYTRIN) 10 MG capsule TAKE 1 CAPSULE BY MOUTH EVERY DAY 90 capsule 0   Turmeric (QC TUMERIC COMPLEX PO) Take 450 mg by mouth daily.     No facility-administered medications prior to visit.        Objective:   Physical Exam  Vitals:   03/17/21 1622  BP: 124/68  Pulse: 64  Temp: 98.4 F (36.9 C)  TempSrc: Oral  SpO2: 94%  Weight: 231 lb 6.4 oz (105 kg)  Height: 5\' 8"  (1.727 m)   Gen: Pleasant, well-nourished, in  no distress,  normal affect  ENT: No lesions,  mouth clear,  oropharynx clear, no postnasal drip  Neck: No JVD, no stridor  Lungs: No use of accessory muscles, L is clear, R insp crackles lower half lung field.   Cardiovascular: RRR, heart sounds normal, no murmur or gallops, no peripheral edema  Musculoskeletal: No deformities, no cyanosis or clubbing  Neuro: alert, awake, non focal  Skin: Warm, no lesions or rash     Assessment & Plan:  COPD (chronic obstructive pulmonary disease) ([place]) He has been maintained on Trelegy, is not sure how much he benefits.  Minimal albuterol use.  I would be okay with a trial off maintenance BD to see if he can tolerate but I would like to defer until after he is stable post chemotherapy.  We can talk about the timing of a break from Trelegy as next office visit.  We will continue Trelegy 1 inhalation once daily for now.  Rinse and gargle after using.  Depending on how you are doing next visit we will talk about possibly temporarily stopping it. Keep albuterol available to use 2 puffs if needed for shortness of breath, chest tightness, wheezing. Follow with Dr Lamonte Sakai in 6 months or sooner if you have any problems  Mesothelioma of pleura [facility]) Following with Dr. Julien Nordmann.  Breath sounds are definitely improved post VATS and pleurodesis by Dr. Roxan Hockey.  Starting chemotherapy next week.   Baltazar Apo, MD, PhD 03/17/2021, 4:43 PM [facility] [phone] or if no answer before 7:00PM call [phone] For any issues after 7:00PM please call eLink [phone]

## 2021-03-17 NOTE — Patient Instructions (Signed)
We will continue Trelegy 1 inhalation once daily for now.  Rinse and gargle after using.  Depending on how you are doing next visit we will talk about possibly temporarily stopping it. Keep albuterol available to use 2 puffs if needed for shortness of breath, chest tightness, wheezing. Follow with Dr. Julien Nordmann as planned next Follow with Dr Lamonte Sakai in 6 months or sooner if you have any problems

## 2021-03-17 NOTE — Assessment & Plan Note (Signed)
Following with Dr. Julien Nordmann.  Breath sounds are definitely improved post VATS and pleurodesis by Dr. Roxan Hockey.  Starting chemotherapy next week.

## 2021-03-18 ENCOUNTER — Inpatient Hospital Stay: Payer: Medicare HMO | Attending: Internal Medicine

## 2021-03-18 ENCOUNTER — Other Ambulatory Visit: Payer: Self-pay

## 2021-03-18 ENCOUNTER — Other Ambulatory Visit: Payer: Self-pay | Admitting: Cardiology

## 2021-03-18 DIAGNOSIS — I313 Pericardial effusion (noninflammatory): Secondary | ICD-10-CM | POA: Insufficient documentation

## 2021-03-18 DIAGNOSIS — R0602 Shortness of breath: Secondary | ICD-10-CM | POA: Insufficient documentation

## 2021-03-18 DIAGNOSIS — K3184 Gastroparesis: Secondary | ICD-10-CM | POA: Insufficient documentation

## 2021-03-18 DIAGNOSIS — R5383 Other fatigue: Secondary | ICD-10-CM | POA: Insufficient documentation

## 2021-03-18 DIAGNOSIS — C450 Mesothelioma of pleura: Secondary | ICD-10-CM | POA: Insufficient documentation

## 2021-03-18 DIAGNOSIS — Z8711 Personal history of peptic ulcer disease: Secondary | ICD-10-CM | POA: Insufficient documentation

## 2021-03-18 DIAGNOSIS — Z79899 Other long term (current) drug therapy: Secondary | ICD-10-CM | POA: Insufficient documentation

## 2021-03-18 DIAGNOSIS — Z86010 Personal history of colonic polyps: Secondary | ICD-10-CM | POA: Insufficient documentation

## 2021-03-18 DIAGNOSIS — N400 Enlarged prostate without lower urinary tract symptoms: Secondary | ICD-10-CM | POA: Insufficient documentation

## 2021-03-18 DIAGNOSIS — Z5111 Encounter for antineoplastic chemotherapy: Secondary | ICD-10-CM | POA: Insufficient documentation

## 2021-03-18 DIAGNOSIS — I700 Atherosclerosis of aorta: Secondary | ICD-10-CM | POA: Insufficient documentation

## 2021-03-18 DIAGNOSIS — M2550 Pain in unspecified joint: Secondary | ICD-10-CM | POA: Insufficient documentation

## 2021-03-23 ENCOUNTER — Encounter: Payer: Self-pay | Admitting: Internal Medicine

## 2021-03-23 ENCOUNTER — Other Ambulatory Visit: Payer: Self-pay

## 2021-03-23 ENCOUNTER — Inpatient Hospital Stay: Payer: Medicare HMO

## 2021-03-23 ENCOUNTER — Inpatient Hospital Stay (HOSPITAL_BASED_OUTPATIENT_CLINIC_OR_DEPARTMENT_OTHER): Payer: Medicare HMO | Admitting: Internal Medicine

## 2021-03-23 VITALS — BP 133/71

## 2021-03-23 VITALS — BP 131/62 | HR 88 | Temp 98.1°F | Resp 20 | Ht 68.0 in | Wt 230.1 lb

## 2021-03-23 DIAGNOSIS — R0602 Shortness of breath: Secondary | ICD-10-CM | POA: Diagnosis not present

## 2021-03-23 DIAGNOSIS — C450 Mesothelioma of pleura: Secondary | ICD-10-CM

## 2021-03-23 DIAGNOSIS — Z79899 Other long term (current) drug therapy: Secondary | ICD-10-CM | POA: Diagnosis not present

## 2021-03-23 DIAGNOSIS — K3184 Gastroparesis: Secondary | ICD-10-CM | POA: Diagnosis not present

## 2021-03-23 DIAGNOSIS — Z86010 Personal history of colonic polyps: Secondary | ICD-10-CM | POA: Diagnosis not present

## 2021-03-23 DIAGNOSIS — Z8711 Personal history of peptic ulcer disease: Secondary | ICD-10-CM | POA: Diagnosis not present

## 2021-03-23 DIAGNOSIS — N400 Enlarged prostate without lower urinary tract symptoms: Secondary | ICD-10-CM | POA: Diagnosis not present

## 2021-03-23 DIAGNOSIS — M2550 Pain in unspecified joint: Secondary | ICD-10-CM | POA: Diagnosis not present

## 2021-03-23 DIAGNOSIS — I700 Atherosclerosis of aorta: Secondary | ICD-10-CM | POA: Diagnosis not present

## 2021-03-23 DIAGNOSIS — Z5111 Encounter for antineoplastic chemotherapy: Secondary | ICD-10-CM | POA: Diagnosis not present

## 2021-03-23 DIAGNOSIS — I313 Pericardial effusion (noninflammatory): Secondary | ICD-10-CM | POA: Diagnosis not present

## 2021-03-23 DIAGNOSIS — R5383 Other fatigue: Secondary | ICD-10-CM | POA: Diagnosis not present

## 2021-03-23 DIAGNOSIS — J90 Pleural effusion, not elsewhere classified: Secondary | ICD-10-CM

## 2021-03-23 DIAGNOSIS — Z95828 Presence of other vascular implants and grafts: Secondary | ICD-10-CM

## 2021-03-23 LAB — BASIC METABOLIC PANEL - CANCER CENTER ONLY
Anion gap: 11 (ref 5–15)
BUN: 13 mg/dL (ref 8–23)
CO2: 25 mmol/L (ref 22–32)
Calcium: 9.6 mg/dL (ref 8.9–10.3)
Chloride: 104 mmol/L (ref 98–111)
Creatinine: 0.94 mg/dL (ref 0.61–1.24)
GFR, Estimated: >60 mL/min (ref >60)
Glucose, Bld: 177 mg/dL — ABNORMAL HIGH (ref 70–99)
Potassium: 3.7 mmol/L (ref 3.5–5.1)
Sodium: 140 mmol/L (ref 135–145)

## 2021-03-23 LAB — CBC WITH DIFFERENTIAL (CANCER CENTER ONLY)
Abs Immature Granulocytes: 0.02 10*3/uL (ref 0.00–0.07)
Basophils Absolute: 0 10*3/uL (ref 0.0–0.1)
Basophils Relative: 0 %
Eosinophils Absolute: 0 10*3/uL (ref 0.0–0.5)
Eosinophils Relative: 0 %
HCT: 37 % — ABNORMAL LOW (ref 39.0–52.0)
Hemoglobin: 12.4 g/dL — ABNORMAL LOW (ref 13.0–17.0)
Immature Granulocytes: 0 %
Lymphocytes Relative: 7 %
Lymphs Abs: 0.7 10*3/uL (ref 0.7–4.0)
MCH: 30 pg (ref 26.0–34.0)
MCHC: 33.5 g/dL (ref 30.0–36.0)
MCV: 89.4 fL (ref 80.0–100.0)
Monocytes Absolute: 0.3 10*3/uL (ref 0.1–1.0)
Monocytes Relative: 3 %
Neutro Abs: 8.2 10*3/uL — ABNORMAL HIGH (ref 1.7–7.7)
Neutrophils Relative %: 90 %
Platelet Count: 276 10*3/uL (ref 150–400)
RBC: 4.14 MIL/uL — ABNORMAL LOW (ref 4.22–5.81)
RDW: 13.6 % (ref 11.5–15.5)
WBC Count: 9.2 10*3/uL (ref 4.0–10.5)
nRBC: 0 % (ref 0.0–0.2)

## 2021-03-23 MED ORDER — SODIUM CHLORIDE 0.9 % IV SOLN
Freq: Once | INTRAVENOUS | Status: AC
  Filled 2021-03-23: qty 250

## 2021-03-23 MED ORDER — SODIUM CHLORIDE 0.9 % IV SOLN
10.0000 mg | Freq: Once | INTRAVENOUS | Status: AC
  Administered 2021-03-23: 10 mg via INTRAVENOUS
  Filled 2021-03-23: qty 10

## 2021-03-23 MED ORDER — PALONOSETRON HCL INJECTION 0.25 MG/5ML
INTRAVENOUS | Status: AC
  Filled 2021-03-23: qty 5

## 2021-03-23 MED ORDER — HEPARIN SOD (PORK) LOCK FLUSH 100 UNIT/ML IV SOLN
500.0000 [IU] | Freq: Once | INTRAVENOUS | Status: AC | PRN
  Administered 2021-03-23: 500 [IU]
  Filled 2021-03-23: qty 5

## 2021-03-23 MED ORDER — SODIUM CHLORIDE 0.9% FLUSH
10.0000 mL | Freq: Once | INTRAVENOUS | Status: AC
  Administered 2021-03-23: 10 mL
  Filled 2021-03-23: qty 10

## 2021-03-23 MED ORDER — SODIUM CHLORIDE 0.9 % IV SOLN
500.0000 mg/m2 | Freq: Once | INTRAVENOUS | Status: AC
  Administered 2021-03-23: 1100 mg via INTRAVENOUS
  Filled 2021-03-23: qty 4

## 2021-03-23 MED ORDER — SODIUM CHLORIDE 0.9 % IV SOLN
619.0000 mg | Freq: Once | INTRAVENOUS | Status: AC
  Administered 2021-03-23: 620 mg via INTRAVENOUS
  Filled 2021-03-23: qty 62

## 2021-03-23 MED ORDER — SODIUM CHLORIDE 0.9 % IV SOLN
15.0000 mg/kg | Freq: Once | INTRAVENOUS | Status: AC
  Administered 2021-03-23: 1600 mg via INTRAVENOUS
  Filled 2021-03-23: qty 64

## 2021-03-23 MED ORDER — PALONOSETRON HCL INJECTION 0.25 MG/5ML
0.2500 mg | Freq: Once | INTRAVENOUS | Status: AC
  Administered 2021-03-23: 0.25 mg via INTRAVENOUS

## 2021-03-23 MED ORDER — SODIUM CHLORIDE 0.9% FLUSH
10.0000 mL | INTRAVENOUS | Status: DC | PRN
  Administered 2021-03-23: 10 mL
  Filled 2021-03-23: qty 10

## 2021-03-23 MED ORDER — SODIUM CHLORIDE 0.9 % IV SOLN
150.0000 mg | Freq: Once | INTRAVENOUS | Status: AC
  Administered 2021-03-23: 150 mg via INTRAVENOUS
  Filled 2021-03-23: qty 150

## 2021-03-23 NOTE — Progress Notes (Signed)
Per Dr. Julien Nordmann, ok to proceed with chemotherapy without a urine protein.

## 2021-03-23 NOTE — Progress Notes (Signed)
[place] [phone]   [phone]  OFFICE PROGRESS NOTE  Carlena Hurl, PA-C [place] [address]  DIAGNOSIS: Stage IB (T2, N0, M0) unresectable right malignant pleural mesothelioma.,  Epithelioid type diagnosed in June 2022.  PRIOR THERAPY: Status post right VATS with pleural biopsy and talc pleurodesis under the care of Dr. Roxan Hockey on 02/27/2021.  CURRENT THERAPY: Systemic chemotherapy with carboplatin for AUC of 5, Alimta 500 Mg/M2 and Avastin 15 mg/KG every 3 weeks.  First dose March 23, 2021.  INTERVAL HISTORY: Guy Evans 70 y.o. male returns to the clinic today for follow-up visit.  The patient is feeling fine today with no concerning complaints except for mild fatigue and shortness of breath with exertion.  He denied having any current chest pain, cough or hemoptysis.  He denied having any fever or chills.  He has no nausea, vomiting, diarrhea or constipation.  He has no headache or visual changes.  He has no weight loss or night sweats.  He is here today for evaluation before starting cycle number one of his systemic chemotherapy.   MEDICAL HISTORY: Past Medical History:  Diagnosis Date   Arthritis    hands   Enlarged prostate    takes Terazosin daily   Gastroparesis    GERD (gastroesophageal reflux disease)    occasionally but no meds required   H/O cardiovascular stress test 10/2019   nuclear, normal, Dr. Fransico Him   History of colon polyps    History of gastric ulcer    Joint pain    knees and ankles   Urinary frequency     ALLERGIES:  has No Known Allergies.  MEDICATIONS:  Current Outpatient Medications  Medication Sig Dispense Refill   acetaminophen (TYLENOL) 500 MG tablet Take 2 tablets (1,000 mg total) by mouth every 6 (six) hours as needed. 30 tablet 0   albuterol (VENTOLIN HFA) 108 (90 Base) MCG/ACT inhaler Inhale 2 puffs into the lungs every 6 (six) hours as needed for wheezing or  shortness of breath. 8 g 0   aspirin 81 MG EC tablet Take 81 mg by mouth daily.     atorvastatin (LIPITOR) 80 MG tablet Take 1 tablet (80 mg total) by mouth daily. Please make overdue appt with Dr. Radford Pax before anymore refills. Thank you 3rd and Final Attempt 15 tablet 0   dexamethasone (DECADRON) 4 MG tablet 4 mg p.o. twice daily the day before, day of and day after chemotherapy every 3 weeks 40 tablet 1   Fluticasone-Umeclidin-Vilant (TRELEGY ELLIPTA) 100-62.5-25 MCG/INH AEPB Inhale 1 puff into the lungs daily. 28 each 5   folic acid (FOLVITE) 1 MG tablet Take 1 tablet (1 mg total) by mouth daily. 30 tablet 4   glucosamine-chondroitin 500-400 MG tablet Take 1 tablet by mouth daily.     lidocaine-prilocaine (EMLA) cream Apply to the Port-A-Cath site 30-60-minute before chemotherapy. 30 g 0   omeprazole (PRILOSEC) 20 MG capsule Take 20 mg by mouth daily.     oxyCODONE (OXY IR/ROXICODONE) 5 MG immediate release tablet Take 1-2 tablets (5-10 mg total) by mouth every 4 (four) hours as needed for moderate pain. 30 tablet 0   phenylephrine (NEO-SYNEPHRINE) 1 % nasal spray Place 1 drop into both nostrils at bedtime as needed for congestion.     prochlorperazine (COMPAZINE) 10 MG tablet Take 1 tablet (10 mg total) by mouth every 6 (six) hours as needed for nausea or vomiting. 30 tablet 0  terazosin (HYTRIN) 10 MG capsule TAKE 1 CAPSULE BY MOUTH EVERY DAY 90 capsule 0   Turmeric (QC TUMERIC COMPLEX PO) Take 450 mg by mouth daily.     No current facility-administered medications for this visit.    SURGICAL HISTORY:  Past Surgical History:  Procedure Laterality Date   CHOLECYSTECTOMY N/A 11/19/2013   Procedure: LAPAROSCOPIC CHOLECYSTECTOMY;  Surgeon: Harl Bowie, MD;  Location: [facility];  Service: General;  Laterality: N/A;   COLONOSCOPY     DIAGNOSTIC LAPAROSCOPY     lap chole   ESOPHAGOGASTRODUODENOSCOPY     IR IMAGING GUIDED PORT INSERTION  03/11/2021   skin spots removed and tested      done in the office   THORACENTESIS N/A 01/14/2021   Procedure: THORACENTESIS;  Surgeon: Collene Gobble, MD;  Location: [facility];  Service: Cardiopulmonary;  Laterality: N/A;   THORACENTESIS N/A 01/23/2021   Procedure: THORACENTESIS;  Surgeon: Collene Gobble, MD;  [facility];  Service: Cardiopulmonary;  Laterality: N/A;   VIDEO ASSISTED THORACOSCOPY (VATS) W/TALC PLEUADESIS Right 02/27/2021   Procedure: VIDEO ASSISTED THORACOSCOPY (VATS) WITH  TALC PLEUADESIS and PLEURAL BIOPSY;  Surgeon: Melrose Nakayama, MD;  Location: [facility];  Service: Thoracic;  Laterality: Right;    REVIEW OF SYSTEMS:  A comprehensive review of systems was negative except for: Constitutional: positive for fatigue Respiratory: positive for dyspnea on exertion   PHYSICAL EXAMINATION: General appearance: alert, cooperative, fatigued, and no distress Head: Normocephalic, without obvious abnormality, atraumatic Neck: no adenopathy, no JVD, supple, symmetrical, trachea midline, and thyroid not enlarged, symmetric, no tenderness/mass/nodules Lymph nodes: Cervical, supraclavicular, and axillary nodes normal. Resp: diminished breath sounds RLL and dullness to percussion RLL Back: symmetric, no curvature. ROM normal. No CVA tenderness. Cardio: regular rate and rhythm, S1, S2 normal, no murmur, click, rub or gallop GI: soft, non-tender; bowel sounds normal; no masses,  no organomegaly Extremities: extremities normal, atraumatic, no cyanosis or edema  ECOG PERFORMANCE STATUS: 1 - Symptomatic but completely ambulatory  Blood pressure 131/62, pulse 88, temperature 98.1 F (36.7 C), temperature source Tympanic, resp. rate 20, height 5\' 8"  (1.727 m), weight 230 lb 1.6 oz (104.4 kg), SpO2 96 %.  LABORATORY DATA: Lab Results  Component Value Date   WBC 9.2 03/23/2021   HGB 12.4 (L) 03/23/2021   HCT 37.0 (L) 03/23/2021   MCV 89.4 03/23/2021   PLT 276 03/23/2021      Chemistry      Component Value  Date/Time   NA 141 03/05/2021 1327   NA 141 10/20/2020 1030   K 3.7 03/05/2021 1327   CL 104 03/05/2021 1327   CO2 28 03/05/2021 1327   BUN 13 03/05/2021 1327   BUN 13 10/20/2020 1030   CREATININE 0.89 03/05/2021 1327   CREATININE 1.03 10/18/2016 1027      Component Value Date/Time   CALCIUM 9.3 03/05/2021 1327   ALKPHOS 94 03/05/2021 1327   AST 46 (H) 03/05/2021 1327   ALT 50 (H) 03/05/2021 1327   BILITOT 0.4 03/05/2021 1327       RADIOGRAPHIC STUDIES: DG Chest 2 View  Result Date: 03/16/2021 CLINICAL DATA:  Recurrent right pleural effusion. EXAM: CHEST - 2 VIEW COMPARISON:  PA and lateral chest 03/03/2021. FINDINGS: New right IJ approach Port-A-Cath tip projects in the lower superior vena cava. Small right pleural effusion and basilar airspace opacity are unchanged. The left lung is clear. Heart size is normal. Aortic atherosclerosis. IMPRESSION: No change in a small right pleural effusion basilar  airspace opacity. Right IJ approach Port-A-Cath tip projects in the lower superior vena cava Electronically Signed   By: Inge Rise M.D.   On: 03/16/2021 14:14   DG Chest 2 View  Result Date: 03/03/2021 CLINICAL DATA:  Chest tube placement. EXAM: CHEST - 2 VIEW COMPARISON:  03/02/2021.  PET-CT 02/05/2021. FINDINGS: Heart size stable. Low lung volumes with persistent right base atelectasis/infiltrate and small right pleural effusion. No significant interim change. Questionable pulmonary nodule right apex. Previously identified right chest tube not definitely identified on today's exam. No acute bony abnormality. IMPRESSION: 1. Previous identified right chest tube not definitely identified on today's exam. No pneumothorax. 2. Low lung volumes with persistent right base atelectasis/infiltrate and small right pleural effusion. 3.  Questionable pulmonary nodule right apex. Electronically Signed   By: Marcello Moores  Register   On: 03/03/2021 07:58   DG Chest 2 View  Result Date:  02/26/2021 CLINICAL DATA:  Pre right chest biopsy evaluation. EXAM: CHEST - 2 VIEW COMPARISON:  01/23/2021.  PET-CT dated 02/05/2021. FINDINGS: No significant change in a moderate-sized right pleural effusion and adjacent right basilar atelectasis. The left lung remains clear. The pulmonary vasculature is mildly prominent in the cardiac silhouette is mildly enlarged. Atheromatous aortic arch calcifications. Mild thoracic spine degenerative changes. IMPRESSION: 1. Stable moderate-sized right pleural effusion with mild right basilar atelectasis. 2. Mild cardiomegaly and mild pulmonary vascular congestion. Electronically Signed   By: Claudie Revering M.D.   On: 02/26/2021 17:25   DG CHEST PORT 1 VIEW  Result Date: 03/02/2021 CLINICAL DATA:  Status post right-sided VATS with talc pleurodesis. Chest tube in place. EXAM: PORTABLE CHEST 1 VIEW COMPARISON:  Prior chest x-ray yesterday 03/01/2021 FINDINGS: Stable cardiac and mediastinal contours. Right-sided chest tube remains in good position. No pneumothorax. Residual pleural thickening versus small effusion and associated right basilar atelectasis. Mild vascular congestion without overt edema. No acute osseous abnormality. IMPRESSION: Expected postprocedural changes with residual atelectasis in the right middle and lower lobes. Well-positioned chest tube without evidence of pneumothorax. Mild pulmonary vascular congestion without edema. Electronically Signed   By: Jacqulynn Cadet M.D.   On: 03/02/2021 07:48   DG CHEST PORT 1 VIEW  Result Date: 03/01/2021 CLINICAL DATA:  Cough, shortness of breath right-sided chest tube, pleural effusion EXAM: PORTABLE CHEST 1 VIEW COMPARISON:  02/28/2021 FINDINGS: Small right pleural effusion and associated atelectasis or consolidation. Right-sided chest tubes remain in position. No pneumothorax. The left lung is normally aerated. Cardiomegaly. IMPRESSION: 1. Small right pleural effusion and associated atelectasis or consolidation.  Right-sided chest tubes remain in position. No pneumothorax. 2.  Cardiomegaly. Electronically Signed   By: Eddie Candle M.D.   On: 03/01/2021 10:21   DG CHEST PORT 1 VIEW  Result Date: 02/28/2021 CLINICAL DATA:  Dyspnea EXAM: PORTABLE CHEST 1 VIEW COMPARISON:  02/27/2021 FINDINGS: Right basilar chest tube is unchanged. Small right pleural effusion and associated right basilar atelectasis or infiltrate is stable. Left lung is clear. No pneumothorax. Cardiac size within normal limits. Pulmonary vascularity is normal. IMPRESSION: Stable right basilar chest tube. Unchanged small right pleural effusion and associated right basilar atelectasis or infiltrate. Electronically Signed   By: Fidela Salisbury MD   On: 02/28/2021 07:06   DG CHEST PORT 1 VIEW  Result Date: 02/27/2021 CLINICAL DATA:  Status post thoracotomy EXAM: PORTABLE CHEST 1 VIEW COMPARISON:  02/25/2021, CT 01/14/2021 FINDINGS: Interim placement of right-sided chest drainage catheter projecting over the medial right base. Decreased right pleural effusion with small residual basilar and lateral  pleural disease. Residual airspace disease at the right middle lobe and right base. Stable cardiomediastinal silhouette. Aortic atherosclerosis. No pneumothorax IMPRESSION: 1. Interim placement of right-sided chest drainage tube with decreased right pleural effusion. 2. Small residual right pleural effusion or thickening with residual airspace disease at the right middle lobe and right base Electronically Signed   By: Donavan Foil M.D.   On: 02/27/2021 18:35   IR IMAGING GUIDED PORT INSERTION  Result Date: 03/11/2021 INDICATION: 70 year old male with history of mesothelioma requiring central venous access for chemotherapy. EXAM: IMPLANTED PORT A CATH PLACEMENT WITH ULTRASOUND AND FLUOROSCOPIC GUIDANCE COMPARISON:  None. MEDICATIONS: None. ANESTHESIA/SEDATION: Moderate (conscious) sedation was employed during this procedure. A total of Versed 3 mg and Fentanyl  150 mcg was administered intravenously. Moderate Sedation Time: 29 minutes. The patient's level of consciousness and vital signs were monitored continuously by radiology nursing throughout the procedure under my direct supervision. CONTRAST:  None FLUOROSCOPY TIME:  0 minutes, 16 seconds (5 mGy) COMPLICATIONS: None immediate. PROCEDURE: The procedure, risks, benefits, and alternatives were explained to the patient. Questions regarding the procedure were encouraged and answered. The patient understands and consents to the procedure. The right neck and chest were prepped with chlorhexidine in a sterile fashion, and a sterile drape was applied covering the operative field. Maximum barrier sterile technique with sterile gowns and gloves were used for the procedure. A timeout was performed prior to the initiation of the procedure. Ultrasound was used to examine the jugular vein which was compressible and free of internal echoes. A skin marker was used to demarcate the planned venotomy and port pocket incision sites. Local anesthesia was provided to these sites and the subcutaneous tunnel track with 1% lidocaine with 1:100,000 epinephrine in addition to 0.25% bupivacaine given perceived relative inefficacy of lidocaine. A small incision was created at the jugular access site and blunt dissection was performed of the subcutaneous tissues. Under ultrasound guidance, the jugular vein was accessed with a 21 ga micropuncture needle and an 0.018" wire was inserted to the superior vena cava. Real-time ultrasound guidance was utilized for vascular access including the acquisition of a permanent ultrasound image documenting patency of the accessed vessel. A 5 Fr micopuncture set was then used, through which a 0.035" Koons wire was passed under fluoroscopic guidance into the inferior vena cava. An 8 Fr dilator was then placed over the wire. A subcutaneous port pocket was then created along the upper chest wall utilizing a  combination of sharp and blunt dissection. The pocket was irrigated with sterile saline, packed with gauze, and observed for hemorrhage. A single lumen "ISP" sized power injectable port was chosen for placement. The 8 Fr catheter was tunneled from the port pocket site to the venotomy incision. The port was placed in the pocket. The external catheter was trimmed to appropriate length. The dilator was exchanged for an 8 Fr peel-away sheath under fluoroscopic guidance. The catheter was then placed through the sheath and the sheath was removed. Final catheter positioning was confirmed and documented with a fluoroscopic spot radiograph. The port was accessed with a Huber needle, aspirated, and flushed with heparinized saline. The deep dermal layer of the port pocket incision was closed with interrupted 3-0 Vicryl suture. The skin was opposed with a running subcuticular 4-0 Monocryl suture. Dermabond was then placed over the port pocket and neck incisions. The patient tolerated the procedure well without immediate post procedural complication. FINDINGS: After catheter placement, the tip lies within the superior cavoatrial junction. The catheter  aspirates and flushes normally and is ready for immediate use. IMPRESSION: Successful placement of a power injectable Port-A-Cath via the right internal jugular vein. The catheter is ready for immediate use. Ruthann Cancer, MD [facility] Electronically Signed   By: Ruthann Cancer MD   On: 03/11/2021 14:12    ASSESSMENT AND PLAN: This is a very pleasant 70 years old white male recently diagnosed with unresectable stage IB (T2, N0, M0) malignant pleural mesothelioma, epithelioid type diagnosed and June 2022 and presented with extensive involvement of the right hemithorax. The patient is status post VATS with pleural biopsies and talc pleurodesis in June 2022 under the care of Dr. Roxan Hockey. The patient is currently on  systemic chemotherapy with carboplatin for AUC of 5, Alimta 500 Mg/M2 and Avastin 15 Mg/KG every 3 weeks.  First dose March 23, 2021. The patient is feeling fine today with no concerning complaints except for the baseline fatigue and shortness of breath. I recommended for him to proceed with cycle number one of his treatment today as planned. I will see him back for follow-up visit in 3 weeks for evaluation before starting cycle #2. The patient has a lot of question about his condition, prognosis as well as seeing an attorney for the mesothelioma found and he is scheduled to see one of the attorney tomorrow. He was advised to call immediately if he has any concerning symptoms in the interval. The patient voices understanding of current disease status and treatment options and is in agreement with the current care plan.  All questions were answered. The patient knows to call the clinic with any problems, questions or concerns. We can certainly see the patient much sooner if necessary. The total time spent in the appointment was 55 minutes.  Disclaimer: This note was dictated with voice recognition software. Similar sounding words can inadvertently be transcribed and may not be corrected upon review.

## 2021-03-23 NOTE — Patient Instructions (Signed)
Carboplatin injection What is this medication? CARBOPLATIN (KAR boe pla tin) is a chemotherapy drug. It targets fast dividing cells, like cancer cells, and causes these cells to die. This medicine is usedto treat ovarian cancer and many other cancers. This medicine may be used for other purposes; ask your health care provider orpharmacist if you have questions. COMMON BRAND NAME(S): Paraplatin What should I tell my care team before I take this medication? They need to know if you have any of these conditions: blood disorders hearing problems kidney disease recent or ongoing radiation therapy an unusual or allergic reaction to carboplatin, cisplatin, other chemotherapy, other medicines, foods, dyes, or preservatives pregnant or trying to get pregnant breast-feeding How should I use this medication? This drug is usually given as an infusion into a vein. It is administered in [place] or clinic by a specially trained health care professional. Talk to your pediatrician regarding the use of this medicine in children.Special care may be needed. Overdosage: If you think you have taken too much of this medicine contact apoison control center or emergency room at once. NOTE: This medicine is only for you. Do not share this medicine with others. What if I miss a dose? It is important not to miss a dose. Call your doctor or health careprofessional if you are unable to keep an appointment. What may interact with this medication? medicines for seizures medicines to increase blood counts like filgrastim, pegfilgrastim, sargramostim some antibiotics like amikacin, gentamicin, neomycin, streptomycin, tobramycin vaccines Talk to your doctor or health care professional before taking any of thesemedicines: acetaminophen aspirin ibuprofen ketoprofen naproxen This list may not describe all possible interactions. Give your health care provider a list of all the medicines, herbs, non-prescription drugs, or  dietary supplements you use. Also tell them if you smoke, drink alcohol, or use illegaldrugs. Some items may interact with your medicine. What should I watch for while using this medication? Your condition will be monitored carefully while you are receiving this medicine. You will need important blood work done while you are taking thismedicine. This drug may make you feel generally unwell. This is not uncommon, as chemotherapy can affect healthy cells as well as cancer cells. Report any side effects. Continue your course of treatment even though you feel ill unless yourdoctor tells you to stop. In some cases, you may be given additional medicines to help with side effects.Follow all directions for their use. Call your doctor or health care professional for advice if you get a fever, chills or sore throat, or other symptoms of a cold or flu. Do not treat yourself. This drug decreases your body's ability to fight infections. Try toavoid being around people who are sick. This medicine may increase your risk to bruise or bleed. Call your doctor orhealth care professional if you notice any unusual bleeding. Be careful brushing and flossing your teeth or using a toothpick because you may get an infection or bleed more easily. If you have any dental work done,tell your dentist you are receiving this medicine. Avoid taking products that contain aspirin, acetaminophen, ibuprofen, naproxen, or ketoprofen unless instructed by your doctor. These medicines may hide afever. Do not become pregnant while taking this medicine. Women should inform their doctor if they wish to become pregnant or think they might be pregnant. There is a potential for serious side effects to an unborn child. Talk to your health care professional or pharmacist for more information. Do not breast-feed aninfant while taking this medicine. What side effects may  I notice from receiving this medication? Side effects that you should report to your  doctor or health care professionalas soon as possible: allergic reactions like skin rash, itching or hives, swelling of the face, lips, or tongue signs of infection - fever or chills, cough, sore throat, pain or difficulty passing urine signs of decreased platelets or bleeding - bruising, pinpoint red spots on the skin, black, tarry stools, nosebleeds signs of decreased red blood cells - unusually weak or tired, fainting spells, lightheadedness breathing problems changes in hearing changes in vision chest pain high blood pressure low blood counts - This drug may decrease the number of white blood cells, red blood cells and platelets. You may be at increased risk for infections and bleeding. nausea and vomiting pain, swelling, redness or irritation at the injection site pain, tingling, numbness in the hands or feet problems with balance, talking, walking trouble passing urine or change in the amount of urine Side effects that usually do not require medical attention (report to yourdoctor or health care professional if they continue or are bothersome): hair loss loss of appetite metallic taste in the mouth or changes in taste This list may not describe all possible side effects. Call your doctor for medical advice about side effects. You may report side effects to FDA at1-800-FDA-1088. Where should I keep my medication? This drug is given in a hospital or clinic and will not be stored at home. NOTE: This sheet is a summary. It may not cover all possible information. If you have questions about this medicine, talk to your doctor, pharmacist, orhealth care provider.  2022 Elsevier/Gold Standard (2007-11-28 14:38:05) Pemetrexed injection What is this medication? PEMETREXED (PEM e TREX ed) is a chemotherapy drug used to treat lung cancers like non-small cell lung cancer and mesothelioma. It may also be used to treatother cancers. This medicine may be used for other purposes; ask your health  care provider orpharmacist if you have questions. COMMON BRAND NAME(S): Alimta What should I tell my care team before I take this medication? They need to know if you have any of these conditions: infection (especially a virus infection such as chickenpox, cold sores, or herpes) kidney disease low blood counts, like low white cell, platelet, or red cell counts lung or breathing disease, like asthma radiation therapy an unusual or allergic reaction to pemetrexed, other medicines, foods, dyes, or preservative pregnant or trying to get pregnant breast-feeding How should I use this medication? This drug is given as an infusion into a vein. It is administered in a hospitalor clinic by a specially trained health care professional. Talk to your pediatrician regarding the use of this medicine in children.Special care may be needed. Overdosage: If you think you have taken too much of this medicine contact apoison control center or emergency room at once. NOTE: This medicine is only for you. Do not share this medicine with others. What if I miss a dose? It is important not to miss your dose. Call your doctor or health careprofessional if you are unable to keep an appointment. What may interact with this medication? This medicine may interact with the following medications: Ibuprofen This list may not describe all possible interactions. Give your health care provider a list of all the medicines, herbs, non-prescription drugs, or dietary supplements you use. Also tell them if you smoke, drink alcohol, or use illegaldrugs. Some items may interact with your medicine. What should I watch for while using this medication? Visit your doctor for checks on your  progress. This drug may make you feel generally unwell. This is not uncommon, as chemotherapy can affect healthy cells as well as cancer cells. Report any side effects. Continue your course oftreatment even though you feel ill unless your doctor tells you  to stop. In some cases, you may be given additional medicines to help with side effects.Follow all directions for their use. Call your doctor or health care professional for advice if you get a fever, chills or sore throat, or other symptoms of a cold or flu. Do not treat yourself. This drug decreases your body's ability to fight infections. Try toavoid being around people who are sick. This medicine may increase your risk to bruise or bleed. Call your doctor orhealth care professional if you notice any unusual bleeding. Be careful brushing and flossing your teeth or using a toothpick because you may get an infection or bleed more easily. If you have any dental work done,tell your dentist you are receiving this medicine. Avoid taking products that contain aspirin, acetaminophen, ibuprofen, naproxen, or ketoprofen unless instructed by your doctor. These medicines may hide afever. Call your doctor or health care professional if you get diarrhea or mouthsores. Do not treat yourself. To protect your kidneys, drink water or other fluids as directed while you aretaking this medicine. Do not become pregnant while taking this medicine or for 6 months after stopping it. Women should inform their doctor if they wish to become pregnant or think they might be pregnant. Men should not father a child while taking this medicine and for 3 months after stopping it. This may interfere with the ability to father a child. You should talk to your doctor or health care professional if you are concerned about your fertility. There is a potential for serious side effects to an unborn child. Talk to your health care professional or pharmacist for more information. Do not breast-feed an infantwhile taking this medicine or for 1 week after stopping it. What side effects may I notice from receiving this medication? Side effects that you should report to your doctor or health care professionalas soon as possible: allergic reactions  like skin rash, itching or hives, swelling of the face, lips, or tongue breathing problems redness, blistering, peeling or loosening of the skin, including inside the mouth signs and symptoms of bleeding such as bloody or black, tarry stools; red or dark-brown urine; spitting up blood or brown material that looks like coffee grounds; red spots on the skin; unusual bruising or bleeding from the eye, gums, or nose signs and symptoms of infection like fever or chills; cough; sore throat; pain or trouble passing urine signs and symptoms of kidney injury like trouble passing urine or change in the amount of urine signs and symptoms of liver injury like dark yellow or brown urine; general ill feeling or flu-like symptoms; light-colored stools; loss of appetite; nausea; right upper belly pain; unusually weak or tired; yellowing of the eyes or skin Side effects that usually do not require medical attention (report to yourdoctor or health care professional if they continue or are bothersome): constipation mouth sores nausea, vomiting unusually weak or tired This list may not describe all possible side effects. Call your doctor for medical advice about side effects. You may report side effects to FDA at1-800-FDA-1088. Where should I keep my medication? This drug is given in a hospital or clinic and will not be stored at home. NOTE: This sheet is a summary. It may not cover all possible information. If you  have questions about this medicine, talk to your doctor, pharmacist, orhealth care provider.  2022 Elsevier/Gold Standard (2017-10-12 16:11:33) Bevacizumab injection What is this medication? BEVACIZUMAB (be va SIZ yoo mab) is a monoclonal antibody. It is used to treatmany types of cancer. This medicine may be used for other purposes; ask your health care provider orpharmacist if you have questions. COMMON BRAND NAME(S): Avastin, MVASI, Noah Charon What should I tell my care team before I take this  medication? They need to know if you have any of these conditions: diabetes heart disease high blood pressure history of coughing up blood prior anthracycline chemotherapy (e.g., doxorubicin, daunorubicin, epirubicin) recent or ongoing radiation therapy recent or planning to have surgery stroke an unusual or allergic reaction to bevacizumab, hamster proteins, mouse proteins, other medicines, foods, dyes, or preservatives pregnant or trying to get pregnant breast-feeding How should I use this medication? This medicine is for infusion into a vein. It is given by a health careprofessional in a hospital or clinic setting. Talk to your pediatrician regarding the use of this medicine in children.Special care may be needed. Overdosage: If you think you have taken too much of this medicine contact apoison control center or emergency room at once. NOTE: This medicine is only for you. Do not share this medicine with others. What if I miss a dose? It is important not to miss your dose. Call your doctor or health careprofessional if you are unable to keep an appointment. What may interact with this medication? Interactions are not expected. This list may not describe all possible interactions. Give your health care provider a list of all the medicines, herbs, non-prescription drugs, or dietary supplements you use. Also tell them if you smoke, drink alcohol, or use illegaldrugs. Some items may interact with your medicine. What should I watch for while using this medication? Your condition will be monitored carefully while you are receiving this medicine. You will need important blood work and urine testing done while youare taking this medicine. This medicine may increase your risk to bruise or bleed. Call your doctor orhealth care professional if you notice any unusual bleeding. Before having surgery, talk to your health care provider to make sure it is ok. This drug can increase the risk of poor  healing of your surgical site or wound. You will need to stop this drug for 28 days before surgery. After surgery, wait at least 28 days before restarting this drug. Make sure the surgical site or wound is healed enough before restarting this drug. Talk to your health careprovider if questions. Do not become pregnant while taking this medicine or for 6 months after stopping it. Women should inform their doctor if they wish to become pregnant or think they might be pregnant. There is a potential for serious side effects to an unborn child. Talk to your health care professional or pharmacist for more information. Do not breast-feed an infant while taking this medicine andfor 6 months after the last dose. This medicine has caused ovarian failure in some women. This medicine may interfere with the ability to have a child. You should talk to your doctor orhealth care professional if you are concerned about your fertility. What side effects may I notice from receiving this medication? Side effects that you should report to your doctor or health care professionalas soon as possible: allergic reactions like skin rash, itching or hives, swelling of the face, lips, or tongue chest pain or chest tightness chills coughing up blood high fever seizures severe  constipation signs and symptoms of bleeding such as bloody or black, tarry stools; red or dark-brown urine; spitting up blood or brown material that looks like coffee grounds; red spots on the skin; unusual bruising or bleeding from the eye, gums, or nose signs and symptoms of a blood clot such as breathing problems; chest pain; severe, sudden headache; pain, swelling, warmth in the leg signs and symptoms of a stroke like changes in vision; confusion; trouble speaking or understanding; severe headaches; sudden numbness or weakness of the face, arm or leg; trouble walking; dizziness; loss of balance or coordination stomach pain sweating swelling of legs or  ankles vomiting weight gain Side effects that usually do not require medical attention (report to yourdoctor or health care professional if they continue or are bothersome): back pain changes in taste decreased appetite dry skin nausea tiredness This list may not describe all possible side effects. Call your doctor for medical advice about side effects. You may report side effects to FDA at1-800-FDA-1088. Where should I keep my medication? This drug is given in a hospital or clinic and will not be stored at home. NOTE: This sheet is a summary. It may not cover all possible information. If you have questions about this medicine, talk to your doctor, pharmacist, orhealth care provider.  2022 Elsevier/Gold Standard (2019-06-20 10:50:46)

## 2021-03-25 ENCOUNTER — Other Ambulatory Visit: Payer: Self-pay | Admitting: Medical

## 2021-03-26 ENCOUNTER — Other Ambulatory Visit: Payer: Self-pay | Admitting: Cardiology

## 2021-03-27 ENCOUNTER — Encounter: Payer: Self-pay | Admitting: Internal Medicine

## 2021-03-27 ENCOUNTER — Other Ambulatory Visit: Payer: Self-pay

## 2021-03-27 DIAGNOSIS — C450 Mesothelioma of pleura: Secondary | ICD-10-CM

## 2021-03-27 NOTE — Progress Notes (Signed)
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  Unfortunately there aren't any foundations offering copay assistance for his Dx and the type of ins he has.  I informed him of the [employer], went over what it covers and gave him the income requirement.  He would like to apply so he will bring his proof of income on 03/30/21.  If approved I will give him an expense sheet and my card for any questions or concerns he may have in the future.

## 2021-03-30 ENCOUNTER — Encounter: Payer: Self-pay | Admitting: Internal Medicine

## 2021-03-30 ENCOUNTER — Other Ambulatory Visit: Payer: Self-pay

## 2021-03-30 ENCOUNTER — Inpatient Hospital Stay: Payer: Medicare HMO

## 2021-03-30 DIAGNOSIS — Z5111 Encounter for antineoplastic chemotherapy: Secondary | ICD-10-CM | POA: Diagnosis not present

## 2021-03-30 DIAGNOSIS — R5383 Other fatigue: Secondary | ICD-10-CM | POA: Diagnosis not present

## 2021-03-30 DIAGNOSIS — C450 Mesothelioma of pleura: Secondary | ICD-10-CM | POA: Diagnosis not present

## 2021-03-30 DIAGNOSIS — M2550 Pain in unspecified joint: Secondary | ICD-10-CM | POA: Diagnosis not present

## 2021-03-30 DIAGNOSIS — I700 Atherosclerosis of aorta: Secondary | ICD-10-CM | POA: Diagnosis not present

## 2021-03-30 DIAGNOSIS — K3184 Gastroparesis: Secondary | ICD-10-CM | POA: Diagnosis not present

## 2021-03-30 DIAGNOSIS — N400 Enlarged prostate without lower urinary tract symptoms: Secondary | ICD-10-CM | POA: Diagnosis not present

## 2021-03-30 DIAGNOSIS — R0602 Shortness of breath: Secondary | ICD-10-CM | POA: Diagnosis not present

## 2021-03-30 DIAGNOSIS — I313 Pericardial effusion (noninflammatory): Secondary | ICD-10-CM | POA: Diagnosis not present

## 2021-03-30 DIAGNOSIS — Z86010 Personal history of colonic polyps: Secondary | ICD-10-CM | POA: Diagnosis not present

## 2021-03-30 DIAGNOSIS — Z95828 Presence of other vascular implants and grafts: Secondary | ICD-10-CM

## 2021-03-30 LAB — CBC WITH DIFFERENTIAL (CANCER CENTER ONLY)
Abs Immature Granulocytes: 0.01 10*3/uL (ref 0.00–0.07)
Basophils Absolute: 0 10*3/uL (ref 0.0–0.1)
Basophils Relative: 0 %
Eosinophils Absolute: 0.4 10*3/uL (ref 0.0–0.5)
Eosinophils Relative: 9 %
HCT: 34.1 % — ABNORMAL LOW (ref 39.0–52.0)
Hemoglobin: 11.7 g/dL — ABNORMAL LOW (ref 13.0–17.0)
Immature Granulocytes: 0 %
Lymphocytes Relative: 23 %
Lymphs Abs: 1.1 10*3/uL (ref 0.7–4.0)
MCH: 30.1 pg (ref 26.0–34.0)
MCHC: 34.3 g/dL (ref 30.0–36.0)
MCV: 87.7 fL (ref 80.0–100.0)
Monocytes Absolute: 0.5 10*3/uL (ref 0.1–1.0)
Monocytes Relative: 10 %
Neutro Abs: 2.7 10*3/uL (ref 1.7–7.7)
Neutrophils Relative %: 58 %
Platelet Count: 160 10*3/uL (ref 150–400)
RBC: 3.89 MIL/uL — ABNORMAL LOW (ref 4.22–5.81)
RDW: 13.4 % (ref 11.5–15.5)
WBC Count: 4.7 10*3/uL (ref 4.0–10.5)
nRBC: 0 % (ref 0.0–0.2)

## 2021-03-30 LAB — CMP (CANCER CENTER ONLY)
ALT: 15 U/L (ref 0–44)
AST: 14 U/L — ABNORMAL LOW (ref 15–41)
Albumin: 3.2 g/dL — ABNORMAL LOW (ref 3.5–5.0)
Alkaline Phosphatase: 97 U/L (ref 38–126)
Anion gap: 6 (ref 5–15)
BUN: 16 mg/dL (ref 8–23)
CO2: 28 mmol/L (ref 22–32)
Calcium: 9.3 mg/dL (ref 8.9–10.3)
Chloride: 104 mmol/L (ref 98–111)
Creatinine: 0.78 mg/dL (ref 0.61–1.24)
GFR, Estimated: >60 mL/min (ref >60)
Glucose, Bld: 116 mg/dL — ABNORMAL HIGH (ref 70–99)
Potassium: 3.8 mmol/L (ref 3.5–5.1)
Sodium: 138 mmol/L (ref 135–145)
Total Bilirubin: 0.6 mg/dL (ref 0.3–1.2)
Total Protein: 6.7 g/dL (ref 6.5–8.1)

## 2021-03-30 MED ORDER — SODIUM CHLORIDE 0.9% FLUSH
10.0000 mL | Freq: Once | INTRAVENOUS | Status: AC
  Administered 2021-03-30: 10 mL
  Filled 2021-03-30: qty 10

## 2021-03-30 MED ORDER — HEPARIN SOD (PORK) LOCK FLUSH 100 UNIT/ML IV SOLN
500.0000 [IU] | Freq: Once | INTRAVENOUS | Status: AC
  Administered 2021-03-30: 500 [IU]
  Filled 2021-03-30: qty 5

## 2021-03-30 NOTE — Progress Notes (Signed)
Pt provided his proof of income and he's overqualified for the [employer].

## 2021-04-06 ENCOUNTER — Other Ambulatory Visit: Payer: Self-pay

## 2021-04-06 ENCOUNTER — Inpatient Hospital Stay: Payer: Medicare HMO | Attending: Internal Medicine

## 2021-04-06 DIAGNOSIS — M25572 Pain in left ankle and joints of left foot: Secondary | ICD-10-CM | POA: Diagnosis not present

## 2021-04-06 DIAGNOSIS — C450 Mesothelioma of pleura: Secondary | ICD-10-CM

## 2021-04-06 DIAGNOSIS — Z95828 Presence of other vascular implants and grafts: Secondary | ICD-10-CM

## 2021-04-06 DIAGNOSIS — M25562 Pain in left knee: Secondary | ICD-10-CM | POA: Diagnosis not present

## 2021-04-06 DIAGNOSIS — M25571 Pain in right ankle and joints of right foot: Secondary | ICD-10-CM | POA: Diagnosis not present

## 2021-04-06 DIAGNOSIS — N400 Enlarged prostate without lower urinary tract symptoms: Secondary | ICD-10-CM | POA: Insufficient documentation

## 2021-04-06 DIAGNOSIS — Z8711 Personal history of peptic ulcer disease: Secondary | ICD-10-CM | POA: Insufficient documentation

## 2021-04-06 DIAGNOSIS — M129 Arthropathy, unspecified: Secondary | ICD-10-CM | POA: Diagnosis not present

## 2021-04-06 DIAGNOSIS — Z5111 Encounter for antineoplastic chemotherapy: Secondary | ICD-10-CM | POA: Insufficient documentation

## 2021-04-06 DIAGNOSIS — Z86010 Personal history of colonic polyps: Secondary | ICD-10-CM | POA: Diagnosis not present

## 2021-04-06 DIAGNOSIS — Z79899 Other long term (current) drug therapy: Secondary | ICD-10-CM | POA: Insufficient documentation

## 2021-04-06 DIAGNOSIS — R066 Hiccough: Secondary | ICD-10-CM | POA: Diagnosis not present

## 2021-04-06 DIAGNOSIS — K219 Gastro-esophageal reflux disease without esophagitis: Secondary | ICD-10-CM | POA: Insufficient documentation

## 2021-04-06 DIAGNOSIS — M25561 Pain in right knee: Secondary | ICD-10-CM | POA: Diagnosis not present

## 2021-04-06 DIAGNOSIS — J90 Pleural effusion, not elsewhere classified: Secondary | ICD-10-CM | POA: Diagnosis not present

## 2021-04-06 DIAGNOSIS — Z7982 Long term (current) use of aspirin: Secondary | ICD-10-CM | POA: Diagnosis not present

## 2021-04-06 DIAGNOSIS — K5900 Constipation, unspecified: Secondary | ICD-10-CM | POA: Insufficient documentation

## 2021-04-06 LAB — CBC WITH DIFFERENTIAL (CANCER CENTER ONLY)
Abs Immature Granulocytes: 0.01 10*3/uL (ref 0.00–0.07)
Basophils Absolute: 0 10*3/uL (ref 0.0–0.1)
Basophils Relative: 0 %
Eosinophils Absolute: 0.1 10*3/uL (ref 0.0–0.5)
Eosinophils Relative: 3 %
HCT: 34.3 % — ABNORMAL LOW (ref 39.0–52.0)
Hemoglobin: 11.9 g/dL — ABNORMAL LOW (ref 13.0–17.0)
Immature Granulocytes: 0 %
Lymphocytes Relative: 26 %
Lymphs Abs: 1 10*3/uL (ref 0.7–4.0)
MCH: 30.3 pg (ref 26.0–34.0)
MCHC: 34.7 g/dL (ref 30.0–36.0)
MCV: 87.3 fL (ref 80.0–100.0)
Monocytes Absolute: 0.5 10*3/uL (ref 0.1–1.0)
Monocytes Relative: 13 %
Neutro Abs: 2.1 10*3/uL (ref 1.7–7.7)
Neutrophils Relative %: 58 %
Platelet Count: 139 10*3/uL — ABNORMAL LOW (ref 150–400)
RBC: 3.93 MIL/uL — ABNORMAL LOW (ref 4.22–5.81)
RDW: 14.1 % (ref 11.5–15.5)
WBC Count: 3.7 10*3/uL — ABNORMAL LOW (ref 4.0–10.5)
nRBC: 0 % (ref 0.0–0.2)

## 2021-04-06 LAB — CMP (CANCER CENTER ONLY)
ALT: 28 U/L (ref 0–44)
AST: 20 U/L (ref 15–41)
Albumin: 3.4 g/dL — ABNORMAL LOW (ref 3.5–5.0)
Alkaline Phosphatase: 101 U/L (ref 38–126)
Anion gap: 6 (ref 5–15)
BUN: 15 mg/dL (ref 8–23)
CO2: 29 mmol/L (ref 22–32)
Calcium: 9.5 mg/dL (ref 8.9–10.3)
Chloride: 104 mmol/L (ref 98–111)
Creatinine: 0.84 mg/dL (ref 0.61–1.24)
GFR, Estimated: >60 mL/min (ref >60)
Glucose, Bld: 95 mg/dL (ref 70–99)
Potassium: 4.3 mmol/L (ref 3.5–5.1)
Sodium: 139 mmol/L (ref 135–145)
Total Bilirubin: 0.6 mg/dL (ref 0.3–1.2)
Total Protein: 6.7 g/dL (ref 6.5–8.1)

## 2021-04-06 MED ORDER — SODIUM CHLORIDE 0.9% FLUSH
10.0000 mL | Freq: Once | INTRAVENOUS | Status: AC
  Administered 2021-04-06: 10 mL
  Filled 2021-04-06: qty 10

## 2021-04-06 MED ORDER — HEPARIN SOD (PORK) LOCK FLUSH 100 UNIT/ML IV SOLN
500.0000 [IU] | Freq: Once | INTRAVENOUS | Status: AC
  Administered 2021-04-06: 500 [IU]
  Filled 2021-04-06: qty 5

## 2021-04-08 NOTE — Progress Notes (Signed)
[facility] OFFICE PROGRESS NOTE  Carlena Hurl, PA-C [place] [address]  DIAGNOSIS: Stage IB (T2, N0, M0) unresectable right malignant pleural mesothelioma.,  Epithelioid type diagnosed in June 2022.  PRIOR THERAPY: Status post right VATS with pleural biopsy and talc pleurodesis under the care of Dr. Roxan Hockey on 02/27/2021.  CURRENT THERAPY: Systemic chemotherapy with carboplatin for AUC of 5, Alimta 500 Mg/M2 and Avastin 15 mg/KG every 3 weeks.  First dose March 23, 2021. Status post 1 cycle.   INTERVAL HISTORY: Guy Evans 70 y.o. male returns to the clinic today for a follow-up visit accompanied by his wife.  The patient is feeling fairly well today without any concerning complaints.  The patient was recently diagnosed with mesothelioma.  He is status post his first cycle of treatment and tolerated it well except for some mild constipation, hiccups, fatigue for a few days, and mucositis.  For the hiccups, the patient used Tums which helped. For constipation, he has been using stool softener. The hiccups lasted a day before resolving. For the mucositis, he used biotene and oragel. Today, he denies any fever, chills, or unexplained weight loss. He has night sweats which is not unusual for him. He reports some shortness of breath with exertion when carrying heavy objects and walking long distances but denies any usual cough, hemoptysis, or chest pain.  He denies any nausea, vomiting, or diarrhea.  He denies any abnormal bleeding or bruising.  He denies any headache or visual changes.  The patient is here today for evaluation and repeat blood work before starting cycle #2  MEDICAL HISTORY: Past Medical History:  Diagnosis Date   Arthritis    hands   Enlarged prostate    takes Terazosin daily   Gastroparesis    GERD (gastroesophageal reflux disease)    occasionally but no meds required   H/O cardiovascular stress test 10/2019   nuclear, normal, Dr.  Fransico Him   History of colon polyps    History of gastric ulcer    Joint pain    knees and ankles   Urinary frequency     ALLERGIES:  has No Known Allergies.  MEDICATIONS:  Current Outpatient Medications  Medication Sig Dispense Refill   acetaminophen (TYLENOL) 500 MG tablet Take 2 tablets (1,000 mg total) by mouth every 6 (six) hours as needed. 30 tablet 0   aspirin 81 MG EC tablet Take 81 mg by mouth daily.     atorvastatin (LIPITOR) 80 MG tablet Take 1 tablet (80 mg total) by mouth daily. NEED OV. 7 tablet 0   dexamethasone (DECADRON) 4 MG tablet 4 mg p.o. twice daily the day before, day of and day after chemotherapy every 3 weeks 40 tablet 1   folic acid (FOLVITE) 1 MG tablet Take 1 tablet (1 mg total) by mouth daily. 30 tablet 4   glucosamine-chondroitin 500-400 MG tablet Take 1 tablet by mouth daily.     L-Lysine 1000 MG TABS Take 1-2 tablets by mouth as needed (Daily as needed).     lidocaine-prilocaine (EMLA) cream Apply to the Port-A-Cath site 30-60-minute before chemotherapy. 30 g 0   magic mouthwash SOLN Take 5 mLs by mouth 3 (three) times daily as needed for mouth pain. 240 mL 0   omeprazole (PRILOSEC) 20 MG capsule Take 20 mg by mouth daily.     phenylephrine (NEO-SYNEPHRINE) 1 % nasal spray Place 1 drop into both nostrils at bedtime as needed for congestion.  prochlorperazine (COMPAZINE) 10 MG tablet Take 1 tablet (10 mg total) by mouth every 6 (six) hours as needed for nausea or vomiting. 30 tablet 0   terazosin (HYTRIN) 10 MG capsule TAKE 1 CAPSULE BY MOUTH EVERY DAY 90 capsule 0   Turmeric (QC TUMERIC COMPLEX PO) Take 450 mg by mouth daily.     albuterol (VENTOLIN HFA) 108 (90 Base) MCG/ACT inhaler Inhale 2 puffs into the lungs every 6 (six) hours as needed for wheezing or shortness of breath. (Patient not taking: Reported on 04/13/2021) 8 g 0   Fluticasone-Umeclidin-Vilant (TRELEGY ELLIPTA) 100-62.5-25 MCG/INH AEPB Inhale 1 puff into the lungs daily. (Patient not  taking: Reported on 04/13/2021) 28 each 5   oxyCODONE (OXY IR/ROXICODONE) 5 MG immediate release tablet Take 1-2 tablets (5-10 mg total) by mouth every 4 (four) hours as needed for moderate pain. (Patient not taking: Reported on 04/13/2021) 30 tablet 0   No current facility-administered medications for this visit.    SURGICAL HISTORY:  Past Surgical History:  Procedure Laterality Date   CHOLECYSTECTOMY N/A 11/19/2013   Procedure: LAPAROSCOPIC CHOLECYSTECTOMY;  Surgeon: Harl Bowie, MD;  Location: [facility];  Service: General;  Laterality: N/A;   COLONOSCOPY     DIAGNOSTIC LAPAROSCOPY     lap chole   ESOPHAGOGASTRODUODENOSCOPY     IR IMAGING GUIDED PORT INSERTION  03/11/2021   skin spots removed and tested     done in the office   THORACENTESIS N/A 01/14/2021   Procedure: THORACENTESIS;  Surgeon: Collene Gobble, MD;  Location: [facility];  Service: Cardiopulmonary;  Laterality: N/A;   THORACENTESIS N/A 01/23/2021   Procedure: THORACENTESIS;  Surgeon: Collene Gobble, MD;  Location: [facility] ENDOSCOPY;  Service: Cardiopulmonary;  Laterality: N/A;   VIDEO ASSISTED THORACOSCOPY (VATS) W/TALC PLEUADESIS Right 02/27/2021   Procedure: VIDEO ASSISTED THORACOSCOPY (VATS) WITH  TALC PLEUADESIS and PLEURAL BIOPSY;  Surgeon: Melrose Nakayama, MD;  Location: [facility];  Service: Thoracic;  Laterality: Right;    REVIEW OF SYSTEMS:   Review of Systems  Constitutional: Positive for fatigue.  Negative for appetite change, chills, fever and unexpected weight change.  HENT:  Positive for mouth sores (resolved ). Negative for nosebleeds, sore throat and trouble swallowing.   Eyes: Negative for eye problems and icterus.  Respiratory: Positive for baseline dyspnea on exertion.  Negative for cough, hemoptysis, and wheezing.   Cardiovascular: Negative for chest pain and leg swelling.  Gastrointestinal:  Positive for constipation(resolved) negative for abdominal pain, diarrhea, nausea and vomiting.   Genitourinary: Negative for bladder incontinence, difficulty urinating, dysuria, frequency and hematuria.   Musculoskeletal: Negative for back pain, gait problem, neck pain and neck stiffness.  Skin: Negative for itching and rash.  Neurological: Negative for dizziness, extremity weakness, gait problem, headaches, light-headedness and seizures.  Hematological: Negative for adenopathy. Does not bruise/bleed easily.  Psychiatric/Behavioral: Negative for confusion, depression and sleep disturbance. The patient is not nervous/anxious.     PHYSICAL EXAMINATION:  Blood pressure (!) 148/75, pulse 76, temperature 97.8 F (36.6 C), temperature source Tympanic, resp. rate 18, height 5\' 8"  (1.727 m), weight 230 lb 6.4 oz (104.5 kg), SpO2 96 %.  ECOG PERFORMANCE STATUS: 1  Physical Exam  Constitutional: Oriented to person, place, and time and well-developed, well-nourished, and in no distress. HENT:  Head: Normocephalic and atraumatic.  Mouth/Throat: Oropharynx is clear and moist. No oropharyngeal exudate.  Eyes: Conjunctivae are normal. Right eye exhibits no discharge. Left eye exhibits no discharge. No scleral icterus.  Neck: Normal range  of motion. Neck supple.  Cardiovascular: Normal rate, regular rhythm, normal heart sounds and intact distal pulses.   Pulmonary/Chest: Effort normal.  Decreased breath sounds in the right lung.  No respiratory distress. No wheezes. No rales.  Abdominal: Soft. Bowel sounds are normal. Exhibits no distension and no mass. There is no tenderness.  Musculoskeletal: Normal range of motion. Exhibits no edema.  Lymphadenopathy:    No cervical adenopathy.  Neurological: Alert and oriented to person, place, and time. Exhibits normal muscle tone. Gait normal. Coordination normal.  Skin: Skin is warm and dry. No rash noted. Not diaphoretic. No erythema. No pallor.  Psychiatric: Mood, memory and judgment normal.  Vitals reviewed.  LABORATORY DATA: Lab Results  Component  Value Date   WBC 5.1 04/13/2021   HGB 11.9 (L) 04/13/2021   HCT 35.5 (L) 04/13/2021   MCV 89.4 04/13/2021   PLT 279 04/13/2021      Chemistry      Component Value Date/Time   NA 142 04/13/2021 1050   NA 141 10/20/2020 1030   K 4.2 04/13/2021 1050   CL 106 04/13/2021 1050   CO2 27 04/13/2021 1050   BUN 16 04/13/2021 1050   BUN 13 10/20/2020 1030   CREATININE 1.04 04/13/2021 1050   CREATININE 1.03 10/18/2016 1027      Component Value Date/Time   CALCIUM 9.8 04/13/2021 1050   ALKPHOS 112 04/13/2021 1050   AST 10 (L) 04/13/2021 1050   ALT 17 04/13/2021 1050   BILITOT 0.4 04/13/2021 1050       RADIOGRAPHIC STUDIES:  DG Chest 2 View  Result Date: 03/16/2021 CLINICAL DATA:  Recurrent right pleural effusion. EXAM: CHEST - 2 VIEW COMPARISON:  PA and lateral chest 03/03/2021. FINDINGS: New right IJ approach Port-A-Cath tip projects in the lower superior vena cava. Small right pleural effusion and basilar airspace opacity are unchanged. The left lung is clear. Heart size is normal. Aortic atherosclerosis. IMPRESSION: No change in a small right pleural effusion basilar airspace opacity. Right IJ approach Port-A-Cath tip projects in the lower superior vena cava Electronically Signed   By: Inge Rise M.D.   On: 03/16/2021 14:14     ASSESSMENT/PLAN:  This is a very pleasant 70 year old Caucasian male diagnosed with unresectable stage Ib (T2, N0, M0) malignant pleural mesothelioma epithelioid type.  He was diagnosed in June 2022.  He presented with extensive involvement of the right hemithorax.  Patient underwent VATS with pleural biopsy and Talc pleurodesis in June 2022 under the care of Dr. Roxan Hockey.  This was performed on 02/27/2021.  The patient is currently undergoing systemic chemotherapy with carboplatin for an AUC of 5, Alimta 500 mg per metered squared, and Avastin 15 mg/kg IV every 3 weeks.  The patient is status post his first cycle and he tolerated it well except for  mild fatigue, mucositis, mild constipation, and hiccups.  The patient was seen with Dr. Julien Nordmann today.  Labs were reviewed.  Recommend that he proceed with cycle #2 today scheduled.  We will see him back for follow-up visit in 3 weeks for evaluation and repeat blood work before starting cycle #3.  I gave the patient a prescription for Magic mouthwash should he develop significant mucositis.  Of course, he was encouraged to continue using Biotene and practice good oral hygiene  For the hiccups, Dr. Julien Nordmann encouraged the patient to try using his antiemetic which may help his hiccups.  The patient was advised to call immediately if he has any concerning symptoms in  the interval. The patient voices understanding of current disease status and treatment options and is in agreement with the current care plan. All questions were answered. The patient knows to call the clinic with any problems, questions or concerns. We can certainly see the patient much sooner if necessary   Orders Placed This Encounter  Procedures   Total Protein, Urine dipstick    Standing Status:   Standing    Number of Occurrences:   5    Standing Expiration Date:   04/13/2022      Tobe Sos Tabathia Knoche, PA-C 04/13/21  ADDENDUM: Hematology/Oncology Attending: I had a face-to-face encounter with the patient today.  I reviewed his record, lab and recommended his care plan.  This is a very pleasant 70 years old white male recently diagnosed with malignant pleural mesothelioma, epithelioid type in June 2022 and he is currently undergoing systemic chemotherapy with carboplatin for AUC of 5, Alimta 500 Mg/M2 and Avastin 15 Mg/KG every 3 weeks status post 1 cycle.  The patient tolerated the first cycle of his treatment well except for mild fatigue few days after the treatment. I recommended for him to proceed with cycle #2 today as planned. We will see him back for follow-up visit in 3 weeks for evaluation before starting  cycle #3. The patient was advised to call immediately if he has any other concerning symptoms in the interval. Disclaimer: This note was dictated with voice recognition software. Similar sounding words can inadvertently be transcribed and may be missed upon review. Eilleen Kempf, MD 04/13/21

## 2021-04-13 ENCOUNTER — Other Ambulatory Visit: Payer: Self-pay

## 2021-04-13 ENCOUNTER — Inpatient Hospital Stay: Payer: Medicare HMO

## 2021-04-13 ENCOUNTER — Inpatient Hospital Stay: Payer: Medicare HMO | Admitting: Physician Assistant

## 2021-04-13 ENCOUNTER — Encounter: Payer: Self-pay | Admitting: Internal Medicine

## 2021-04-13 ENCOUNTER — Other Ambulatory Visit: Payer: Self-pay | Admitting: Physician Assistant

## 2021-04-13 VITALS — BP 148/75 | HR 76 | Temp 97.8°F | Resp 18 | Ht 68.0 in | Wt 230.4 lb

## 2021-04-13 DIAGNOSIS — M25561 Pain in right knee: Secondary | ICD-10-CM | POA: Diagnosis not present

## 2021-04-13 DIAGNOSIS — M25562 Pain in left knee: Secondary | ICD-10-CM | POA: Diagnosis not present

## 2021-04-13 DIAGNOSIS — C450 Mesothelioma of pleura: Secondary | ICD-10-CM | POA: Diagnosis not present

## 2021-04-13 DIAGNOSIS — K219 Gastro-esophageal reflux disease without esophagitis: Secondary | ICD-10-CM | POA: Diagnosis not present

## 2021-04-13 DIAGNOSIS — M25572 Pain in left ankle and joints of left foot: Secondary | ICD-10-CM | POA: Diagnosis not present

## 2021-04-13 DIAGNOSIS — N400 Enlarged prostate without lower urinary tract symptoms: Secondary | ICD-10-CM | POA: Diagnosis not present

## 2021-04-13 DIAGNOSIS — M129 Arthropathy, unspecified: Secondary | ICD-10-CM | POA: Diagnosis not present

## 2021-04-13 DIAGNOSIS — K1230 Oral mucositis (ulcerative), unspecified: Secondary | ICD-10-CM

## 2021-04-13 DIAGNOSIS — Z95828 Presence of other vascular implants and grafts: Secondary | ICD-10-CM

## 2021-04-13 DIAGNOSIS — Z5111 Encounter for antineoplastic chemotherapy: Secondary | ICD-10-CM | POA: Diagnosis not present

## 2021-04-13 DIAGNOSIS — K5900 Constipation, unspecified: Secondary | ICD-10-CM | POA: Diagnosis not present

## 2021-04-13 DIAGNOSIS — R066 Hiccough: Secondary | ICD-10-CM | POA: Diagnosis not present

## 2021-04-13 LAB — CBC WITH DIFFERENTIAL (CANCER CENTER ONLY)
Abs Immature Granulocytes: 0.02 10*3/uL (ref 0.00–0.07)
Basophils Absolute: 0 10*3/uL (ref 0.0–0.1)
Basophils Relative: 0 %
Eosinophils Absolute: 0 10*3/uL (ref 0.0–0.5)
Eosinophils Relative: 0 %
HCT: 35.5 % — ABNORMAL LOW (ref 39.0–52.0)
Hemoglobin: 11.9 g/dL — ABNORMAL LOW (ref 13.0–17.0)
Immature Granulocytes: 0 %
Lymphocytes Relative: 11 %
Lymphs Abs: 0.6 10*3/uL — ABNORMAL LOW (ref 0.7–4.0)
MCH: 30 pg (ref 26.0–34.0)
MCHC: 33.5 g/dL (ref 30.0–36.0)
MCV: 89.4 fL (ref 80.0–100.0)
Monocytes Absolute: 0.3 10*3/uL (ref 0.1–1.0)
Monocytes Relative: 5 %
Neutro Abs: 4.3 10*3/uL (ref 1.7–7.7)
Neutrophils Relative %: 84 %
Platelet Count: 279 10*3/uL (ref 150–400)
RBC: 3.97 MIL/uL — ABNORMAL LOW (ref 4.22–5.81)
RDW: 14.9 % (ref 11.5–15.5)
WBC Count: 5.1 10*3/uL (ref 4.0–10.5)
nRBC: 0 % (ref 0.0–0.2)

## 2021-04-13 LAB — CMP (CANCER CENTER ONLY)
ALT: 17 U/L (ref 0–44)
AST: 10 U/L — ABNORMAL LOW (ref 15–41)
Albumin: 3.5 g/dL (ref 3.5–5.0)
Alkaline Phosphatase: 112 U/L (ref 38–126)
Anion gap: 9 (ref 5–15)
BUN: 16 mg/dL (ref 8–23)
CO2: 27 mmol/L (ref 22–32)
Calcium: 9.8 mg/dL (ref 8.9–10.3)
Chloride: 106 mmol/L (ref 98–111)
Creatinine: 1.04 mg/dL (ref 0.61–1.24)
GFR, Estimated: >60 mL/min (ref >60)
Glucose, Bld: 158 mg/dL — ABNORMAL HIGH (ref 70–99)
Potassium: 4.2 mmol/L (ref 3.5–5.1)
Sodium: 142 mmol/L (ref 135–145)
Total Bilirubin: 0.4 mg/dL (ref 0.3–1.2)
Total Protein: 6.8 g/dL (ref 6.5–8.1)

## 2021-04-13 LAB — TOTAL PROTEIN, URINE DIPSTICK: Protein, ur: 30 mg/dL — AB

## 2021-04-13 MED ORDER — SODIUM CHLORIDE 0.9% FLUSH
10.0000 mL | Freq: Once | INTRAVENOUS | Status: AC
  Administered 2021-04-13: 10 mL
  Filled 2021-04-13: qty 10

## 2021-04-13 MED ORDER — SODIUM CHLORIDE 0.9 % IV SOLN
15.0000 mg/kg | Freq: Once | INTRAVENOUS | Status: AC
  Administered 2021-04-13: 1600 mg via INTRAVENOUS
  Filled 2021-04-13: qty 64

## 2021-04-13 MED ORDER — SODIUM CHLORIDE 0.9 % IV SOLN
604.5000 mg | Freq: Once | INTRAVENOUS | Status: AC
  Administered 2021-04-13: 600 mg via INTRAVENOUS
  Filled 2021-04-13: qty 60

## 2021-04-13 MED ORDER — SODIUM CHLORIDE 0.9 % IV SOLN
Freq: Once | INTRAVENOUS | Status: AC
  Filled 2021-04-13: qty 250

## 2021-04-13 MED ORDER — SODIUM CHLORIDE 0.9 % IV SOLN
150.0000 mg | Freq: Once | INTRAVENOUS | Status: AC
  Administered 2021-04-13: 150 mg via INTRAVENOUS
  Filled 2021-04-13: qty 150

## 2021-04-13 MED ORDER — MAGIC MOUTHWASH: 5.0000 mL | Freq: Three times a day (TID) | ORAL | 0 refills | Status: DC | PRN

## 2021-04-13 MED ORDER — HEPARIN SOD (PORK) LOCK FLUSH 100 UNIT/ML IV SOLN
500.0000 [IU] | Freq: Once | INTRAVENOUS | Status: AC | PRN
  Administered 2021-04-13: 500 [IU]
  Filled 2021-04-13: qty 5

## 2021-04-13 MED ORDER — SODIUM CHLORIDE 0.9 % IV SOLN
500.0000 mg/m2 | Freq: Once | INTRAVENOUS | Status: AC
  Administered 2021-04-13: 1100 mg via INTRAVENOUS
  Filled 2021-04-13: qty 40

## 2021-04-13 MED ORDER — PALONOSETRON HCL INJECTION 0.25 MG/5ML
INTRAVENOUS | Status: AC
  Filled 2021-04-13: qty 5

## 2021-04-13 MED ORDER — SODIUM CHLORIDE 0.9 % IV SOLN
10.0000 mg | Freq: Once | INTRAVENOUS | Status: AC
  Administered 2021-04-13: 10 mg via INTRAVENOUS
  Filled 2021-04-13: qty 10

## 2021-04-13 MED ORDER — SODIUM CHLORIDE 0.9% FLUSH
10.0000 mL | INTRAVENOUS | Status: DC | PRN
  Administered 2021-04-13: 10 mL
  Filled 2021-04-13: qty 10

## 2021-04-13 MED ORDER — PALONOSETRON HCL INJECTION 0.25 MG/5ML
0.2500 mg | Freq: Once | INTRAVENOUS | Status: AC
  Administered 2021-04-13: 0.25 mg via INTRAVENOUS

## 2021-04-13 NOTE — Patient Instructions (Signed)
[facility]  Discharge Instructions: Thank you for choosing [facility] to provide your oncology and hematology care.   If you have a lab appointment with the [facility], please go directly to the [facility] and check in at the registration area.   Wear comfortable clothing and clothing appropriate for easy access to any Portacath or PICC line.   We strive to give you quality time with your provider. You may need to reschedule your appointment if you arrive late (15 or more minutes).  Arriving late affects you and other patients whose appointments are after yours.  Also, if you miss three or more appointments without notifying the office, you may be dismissed from the clinic at the provider's discretion.      For prescription refill requests, have your pharmacy contact our office and allow 72 hours for refills to be completed.    Today you received the following chemotherapy and/or immunotherapy agents bevacizumab, alimta, carboplatin      To help prevent nausea and vomiting after your treatment, we encourage you to take your nausea medication as directed.  BELOW ARE SYMPTOMS THAT SHOULD BE REPORTED IMMEDIATELY: *FEVER GREATER THAN 100.4 F (38 C) OR HIGHER *CHILLS OR SWEATING *NAUSEA AND VOMITING THAT IS NOT CONTROLLED WITH YOUR NAUSEA MEDICATION *UNUSUAL SHORTNESS OF BREATH *UNUSUAL BRUISING OR BLEEDING *URINARY PROBLEMS (pain or burning when urinating, or frequent urination) *BOWEL PROBLEMS (unusual diarrhea, constipation, pain near the anus) TENDERNESS IN MOUTH AND THROAT WITH OR WITHOUT PRESENCE OF ULCERS (sore throat, sores in mouth, or a toothache) UNUSUAL RASH, SWELLING OR PAIN  UNUSUAL VAGINAL DISCHARGE OR ITCHING   Items with * indicate a potential emergency and should be followed up as soon as possible or go to the Emergency Department if any problems should occur.  Please show the CHEMOTHERAPY ALERT CARD or IMMUNOTHERAPY  ALERT CARD at check-in to the Emergency Department and triage nurse.  Should you have questions after your visit or need to cancel or reschedule your appointment, please contact [facility]  Dept: [phone]  and follow the prompts.  Office hours are 8:00 a.m. to 4:30 p.m. Monday - Friday. Please note that voicemails left after 4:00 p.m. may not be returned until the following business day.  We are closed weekends and major holidays. You have access to a nurse at all times for urgent questions. Please call the main number to the clinic Dept: [phone] and follow the prompts.   For any non-urgent questions, you may also contact your provider using MyChart. We now offer e-Visits for anyone 20 and older to request care online for non-urgent symptoms. For details visit [email or web address].   Also download the MyChart app! Go to the app store, search "MyChart", open the app, select [facility], and log in with your MyChart username and password.  Due to Covid, a mask is required upon entering the hospital/clinic. If you do not have a mask, one will be given to you upon arrival. For doctor visits, patients may have 1 support person aged 3 or older with them. For treatment visits, patients cannot have anyone with them due to current Covid guidelines and our immunocompromised population.

## 2021-04-20 ENCOUNTER — Other Ambulatory Visit: Payer: Self-pay | Admitting: Cardiology

## 2021-04-20 ENCOUNTER — Inpatient Hospital Stay: Payer: Medicare HMO

## 2021-04-21 ENCOUNTER — Inpatient Hospital Stay: Payer: Medicare HMO

## 2021-04-21 ENCOUNTER — Other Ambulatory Visit: Payer: Self-pay

## 2021-04-21 DIAGNOSIS — R066 Hiccough: Secondary | ICD-10-CM | POA: Diagnosis not present

## 2021-04-21 DIAGNOSIS — M25562 Pain in left knee: Secondary | ICD-10-CM | POA: Diagnosis not present

## 2021-04-21 DIAGNOSIS — K5900 Constipation, unspecified: Secondary | ICD-10-CM | POA: Diagnosis not present

## 2021-04-21 DIAGNOSIS — N400 Enlarged prostate without lower urinary tract symptoms: Secondary | ICD-10-CM | POA: Diagnosis not present

## 2021-04-21 DIAGNOSIS — M25561 Pain in right knee: Secondary | ICD-10-CM | POA: Diagnosis not present

## 2021-04-21 DIAGNOSIS — C450 Mesothelioma of pleura: Secondary | ICD-10-CM | POA: Diagnosis not present

## 2021-04-21 DIAGNOSIS — K219 Gastro-esophageal reflux disease without esophagitis: Secondary | ICD-10-CM | POA: Diagnosis not present

## 2021-04-21 DIAGNOSIS — M129 Arthropathy, unspecified: Secondary | ICD-10-CM | POA: Diagnosis not present

## 2021-04-21 DIAGNOSIS — Z95828 Presence of other vascular implants and grafts: Secondary | ICD-10-CM

## 2021-04-21 DIAGNOSIS — M25572 Pain in left ankle and joints of left foot: Secondary | ICD-10-CM | POA: Diagnosis not present

## 2021-04-21 DIAGNOSIS — Z5111 Encounter for antineoplastic chemotherapy: Secondary | ICD-10-CM | POA: Diagnosis not present

## 2021-04-21 LAB — CMP (CANCER CENTER ONLY)
ALT: 23 U/L (ref 0–44)
AST: 19 U/L (ref 15–41)
Albumin: 3.4 g/dL — ABNORMAL LOW (ref 3.5–5.0)
Alkaline Phosphatase: 94 U/L (ref 38–126)
Anion gap: 6 (ref 5–15)
BUN: 13 mg/dL (ref 8–23)
CO2: 28 mmol/L (ref 22–32)
Calcium: 8.8 mg/dL — ABNORMAL LOW (ref 8.9–10.3)
Chloride: 106 mmol/L (ref 98–111)
Creatinine: 0.78 mg/dL (ref 0.61–1.24)
GFR, Estimated: >60 mL/min (ref >60)
Glucose, Bld: 105 mg/dL — ABNORMAL HIGH (ref 70–99)
Potassium: 4 mmol/L (ref 3.5–5.1)
Sodium: 140 mmol/L (ref 135–145)
Total Bilirubin: 0.4 mg/dL (ref 0.3–1.2)
Total Protein: 6.5 g/dL (ref 6.5–8.1)

## 2021-04-21 LAB — CBC WITH DIFFERENTIAL (CANCER CENTER ONLY)
Abs Immature Granulocytes: 0.02 10*3/uL (ref 0.00–0.07)
Basophils Absolute: 0 10*3/uL (ref 0.0–0.1)
Basophils Relative: 0 %
Eosinophils Absolute: 0.1 10*3/uL (ref 0.0–0.5)
Eosinophils Relative: 2 %
HCT: 33.4 % — ABNORMAL LOW (ref 39.0–52.0)
Hemoglobin: 11.4 g/dL — ABNORMAL LOW (ref 13.0–17.0)
Immature Granulocytes: 1 %
Lymphocytes Relative: 23 %
Lymphs Abs: 1 10*3/uL (ref 0.7–4.0)
MCH: 30.6 pg (ref 26.0–34.0)
MCHC: 34.1 g/dL (ref 30.0–36.0)
MCV: 89.8 fL (ref 80.0–100.0)
Monocytes Absolute: 0.5 10*3/uL (ref 0.1–1.0)
Monocytes Relative: 11 %
Neutro Abs: 2.7 10*3/uL (ref 1.7–7.7)
Neutrophils Relative %: 63 %
Platelet Count: 172 10*3/uL (ref 150–400)
RBC: 3.72 MIL/uL — ABNORMAL LOW (ref 4.22–5.81)
RDW: 14.7 % (ref 11.5–15.5)
WBC Count: 4.3 10*3/uL (ref 4.0–10.5)
nRBC: 0 % (ref 0.0–0.2)

## 2021-04-21 MED ORDER — SODIUM CHLORIDE 0.9% FLUSH
10.0000 mL | Freq: Once | INTRAVENOUS | Status: AC
  Administered 2021-04-21: 10 mL

## 2021-04-21 MED ORDER — HEPARIN SOD (PORK) LOCK FLUSH 100 UNIT/ML IV SOLN
500.0000 [IU] | Freq: Once | INTRAVENOUS | Status: AC
  Administered 2021-04-21: 500 [IU]

## 2021-04-22 MED ORDER — ATORVASTATIN CALCIUM 80 MG PO TABS
80.0000 mg | ORAL_TABLET | Freq: Every day | ORAL | 2 refills | Status: DC
Start: 2021-04-22 — End: 2021-07-20

## 2021-04-26 ENCOUNTER — Encounter: Payer: Self-pay | Admitting: Internal Medicine

## 2021-04-27 ENCOUNTER — Other Ambulatory Visit: Payer: Self-pay

## 2021-04-27 ENCOUNTER — Emergency Department (HOSPITAL_COMMUNITY)
Admission: EM | Admit: 2021-04-27 | Discharge: 2021-04-28 | Disposition: A | Discharge status: Home / Self Care | Payer: Medicare HMO | Attending: Student | Admitting: Student

## 2021-04-27 ENCOUNTER — Encounter (HOSPITAL_COMMUNITY): Payer: Self-pay

## 2021-04-27 ENCOUNTER — Inpatient Hospital Stay: Payer: Medicare HMO

## 2021-04-27 ENCOUNTER — Telehealth: Payer: Self-pay | Admitting: Medical Oncology

## 2021-04-27 DIAGNOSIS — R070 Pain in throat: Secondary | ICD-10-CM | POA: Diagnosis not present

## 2021-04-27 DIAGNOSIS — Z95828 Presence of other vascular implants and grafts: Secondary | ICD-10-CM

## 2021-04-27 DIAGNOSIS — Z5321 Procedure and treatment not carried out due to patient leaving prior to being seen by health care provider: Secondary | ICD-10-CM | POA: Insufficient documentation

## 2021-04-27 DIAGNOSIS — R509 Fever, unspecified: Secondary | ICD-10-CM | POA: Diagnosis not present

## 2021-04-27 DIAGNOSIS — J029 Acute pharyngitis, unspecified: Secondary | ICD-10-CM | POA: Diagnosis not present

## 2021-04-27 DIAGNOSIS — C450 Mesothelioma of pleura: Secondary | ICD-10-CM

## 2021-04-27 LAB — CBC WITH DIFFERENTIAL (CANCER CENTER ONLY)
Abs Immature Granulocytes: 0.01 10*3/uL (ref 0.00–0.07)
Basophils Absolute: 0 10*3/uL (ref 0.0–0.1)
Basophils Relative: 0 %
Eosinophils Absolute: 0 10*3/uL (ref 0.0–0.5)
Eosinophils Relative: 1 %
HCT: 33.4 % — ABNORMAL LOW (ref 39.0–52.0)
Hemoglobin: 11.8 g/dL — ABNORMAL LOW (ref 13.0–17.0)
Immature Granulocytes: 0 %
Lymphocytes Relative: 18 %
Lymphs Abs: 0.9 10*3/uL (ref 0.7–4.0)
MCH: 31.2 pg (ref 26.0–34.0)
MCHC: 35.3 g/dL (ref 30.0–36.0)
MCV: 88.4 fL (ref 80.0–100.0)
Monocytes Absolute: 0.7 10*3/uL (ref 0.1–1.0)
Monocytes Relative: 13 %
Neutro Abs: 3.7 10*3/uL (ref 1.7–7.7)
Neutrophils Relative %: 68 %
Platelet Count: 122 10*3/uL — ABNORMAL LOW (ref 150–400)
RBC: 3.78 MIL/uL — ABNORMAL LOW (ref 4.22–5.81)
RDW: 15.9 % — ABNORMAL HIGH (ref 11.5–15.5)
WBC Count: 5.4 10*3/uL (ref 4.0–10.5)
nRBC: 0 % (ref 0.0–0.2)

## 2021-04-27 LAB — CMP (CANCER CENTER ONLY)
ALT: 18 U/L (ref 0–44)
AST: 19 U/L (ref 15–41)
Albumin: 3.4 g/dL — ABNORMAL LOW (ref 3.5–5.0)
Alkaline Phosphatase: 100 U/L (ref 38–126)
Anion gap: 8 (ref 5–15)
BUN: 13 mg/dL (ref 8–23)
CO2: 28 mmol/L (ref 22–32)
Calcium: 9.6 mg/dL (ref 8.9–10.3)
Chloride: 104 mmol/L (ref 98–111)
Creatinine: 0.88 mg/dL (ref 0.61–1.24)
GFR, Estimated: >60 mL/min (ref >60)
Glucose, Bld: 129 mg/dL — ABNORMAL HIGH (ref 70–99)
Potassium: 3.9 mmol/L (ref 3.5–5.1)
Sodium: 140 mmol/L (ref 135–145)
Total Bilirubin: 0.5 mg/dL (ref 0.3–1.2)
Total Protein: 6.8 g/dL (ref 6.5–8.1)

## 2021-04-27 MED ORDER — SODIUM CHLORIDE 0.9% FLUSH
10.0000 mL | Freq: Once | INTRAVENOUS | Status: AC
  Administered 2021-04-27: 10 mL

## 2021-04-27 MED ORDER — HEPARIN SOD (PORK) LOCK FLUSH 100 UNIT/ML IV SOLN
500.0000 [IU] | Freq: Once | INTRAVENOUS | Status: AC
  Administered 2021-04-27: 500 [IU]

## 2021-04-27 NOTE — Telephone Encounter (Signed)
Called pt in response to his mychart message. I instructed pt to go to ED if he has trouble swallowing , trouble breathing or any  facial swelling.

## 2021-04-27 NOTE — ED Provider Notes (Signed)
Emergency Medicine Provider Triage Evaluation Note  Guy Evans , a 70 y.o. male  was evaluated in triage.  Pt complains of sore throat, lymphadenopathy on left side, and fever started Saturday. No sick contacts. Mesothelioma currently undergoing chemo. 2 weeks ago last treatment. No cp or SOB. No muffled voice. Had labs done this morning.   Review of Systems  Positive: Fever, ST  Negative: Cough, congestion , drooling, muffled voice.   Physical Exam  BP (!) 163/90 (BP Location: Right Arm)   Pulse 76   Temp (!) 100.5 F (38.1 C) (Oral)   Resp 15   Ht 5\' 8"  (1.727 m)   Wt 102.1 kg   SpO2 95%   BMI 34.21 kg/m  Gen:   Awake, no distress , speaking in full sentences Resp:  Normal effort  MSK:   Moves extremities without difficulty  Other:    Medical Decision Making  Medically screening exam initiated at 11:47 PM.  Appropriate orders placed.  Erdem Naas was informed that the remainder of the evaluation will be completed by another provider, this initial triage assessment does not replace that evaluation, and the importance of remaining in the ED until their evaluation is complete.     Alfredia Client, PA-C 04/27/21 2351    Luna Fuse, MD 04/28/21 [phone]

## 2021-04-27 NOTE — ED Triage Notes (Signed)
Pt complains of sore throat x 2 days. Pt is actively undergoing chemotherapy.

## 2021-04-28 NOTE — ED Notes (Signed)
Pt left the facility 

## 2021-05-04 ENCOUNTER — Inpatient Hospital Stay (HOSPITAL_BASED_OUTPATIENT_CLINIC_OR_DEPARTMENT_OTHER): Payer: Medicare HMO | Admitting: Internal Medicine

## 2021-05-04 ENCOUNTER — Other Ambulatory Visit: Payer: Self-pay

## 2021-05-04 ENCOUNTER — Inpatient Hospital Stay: Payer: Medicare HMO

## 2021-05-04 VITALS — BP 154/78 | HR 93 | Temp 97.6°F | Resp 20 | Ht 68.0 in | Wt 228.5 lb

## 2021-05-04 DIAGNOSIS — Z95828 Presence of other vascular implants and grafts: Secondary | ICD-10-CM

## 2021-05-04 DIAGNOSIS — Z77090 Contact with and (suspected) exposure to asbestos: Secondary | ICD-10-CM

## 2021-05-04 DIAGNOSIS — R066 Hiccough: Secondary | ICD-10-CM | POA: Diagnosis not present

## 2021-05-04 DIAGNOSIS — C450 Mesothelioma of pleura: Secondary | ICD-10-CM

## 2021-05-04 DIAGNOSIS — M25562 Pain in left knee: Secondary | ICD-10-CM | POA: Diagnosis not present

## 2021-05-04 DIAGNOSIS — K219 Gastro-esophageal reflux disease without esophagitis: Secondary | ICD-10-CM | POA: Diagnosis not present

## 2021-05-04 DIAGNOSIS — M25561 Pain in right knee: Secondary | ICD-10-CM | POA: Diagnosis not present

## 2021-05-04 DIAGNOSIS — M25572 Pain in left ankle and joints of left foot: Secondary | ICD-10-CM | POA: Diagnosis not present

## 2021-05-04 DIAGNOSIS — K5900 Constipation, unspecified: Secondary | ICD-10-CM | POA: Diagnosis not present

## 2021-05-04 DIAGNOSIS — Z5111 Encounter for antineoplastic chemotherapy: Secondary | ICD-10-CM | POA: Diagnosis not present

## 2021-05-04 DIAGNOSIS — N400 Enlarged prostate without lower urinary tract symptoms: Secondary | ICD-10-CM | POA: Diagnosis not present

## 2021-05-04 DIAGNOSIS — M129 Arthropathy, unspecified: Secondary | ICD-10-CM | POA: Diagnosis not present

## 2021-05-04 LAB — CMP (CANCER CENTER ONLY)
ALT: 15 U/L (ref 0–44)
AST: 16 U/L (ref 15–41)
Albumin: 3.6 g/dL (ref 3.5–5.0)
Alkaline Phosphatase: 99 U/L (ref 38–126)
Anion gap: 9 (ref 5–15)
BUN: 17 mg/dL (ref 8–23)
CO2: 26 mmol/L (ref 22–32)
Calcium: 10.3 mg/dL (ref 8.9–10.3)
Chloride: 104 mmol/L (ref 98–111)
Creatinine: 0.82 mg/dL (ref 0.61–1.24)
GFR, Estimated: >60 mL/min (ref >60)
Glucose, Bld: 132 mg/dL — ABNORMAL HIGH (ref 70–99)
Potassium: 4.3 mmol/L (ref 3.5–5.1)
Sodium: 139 mmol/L (ref 135–145)
Total Bilirubin: 0.4 mg/dL (ref 0.3–1.2)
Total Protein: 7.2 g/dL (ref 6.5–8.1)

## 2021-05-04 LAB — CBC WITH DIFFERENTIAL (CANCER CENTER ONLY)
Abs Immature Granulocytes: 0.06 10*3/uL (ref 0.00–0.07)
Basophils Absolute: 0 10*3/uL (ref 0.0–0.1)
Basophils Relative: 0 %
Eosinophils Absolute: 0 10*3/uL (ref 0.0–0.5)
Eosinophils Relative: 0 %
HCT: 34.8 % — ABNORMAL LOW (ref 39.0–52.0)
Hemoglobin: 11.8 g/dL — ABNORMAL LOW (ref 13.0–17.0)
Immature Granulocytes: 1 %
Lymphocytes Relative: 8 %
Lymphs Abs: 0.7 10*3/uL (ref 0.7–4.0)
MCH: 30.6 pg (ref 26.0–34.0)
MCHC: 33.9 g/dL (ref 30.0–36.0)
MCV: 90.4 fL (ref 80.0–100.0)
Monocytes Absolute: 0.6 10*3/uL (ref 0.1–1.0)
Monocytes Relative: 8 %
Neutro Abs: 6.6 10*3/uL (ref 1.7–7.7)
Neutrophils Relative %: 83 %
Platelet Count: 252 10*3/uL (ref 150–400)
RBC: 3.85 MIL/uL — ABNORMAL LOW (ref 4.22–5.81)
RDW: 16.3 % — ABNORMAL HIGH (ref 11.5–15.5)
WBC Count: 7.9 10*3/uL (ref 4.0–10.5)
nRBC: 0 % (ref 0.0–0.2)

## 2021-05-04 LAB — TOTAL PROTEIN, URINE DIPSTICK

## 2021-05-04 MED ORDER — SODIUM CHLORIDE 0.9% FLUSH
10.0000 mL | Freq: Once | INTRAVENOUS | Status: AC
  Administered 2021-05-04: 10 mL

## 2021-05-04 MED ORDER — SODIUM CHLORIDE 0.9 % IV SOLN
150.0000 mg | Freq: Once | INTRAVENOUS | Status: AC
  Administered 2021-05-04: 150 mg via INTRAVENOUS
  Filled 2021-05-04: qty 150

## 2021-05-04 MED ORDER — SODIUM CHLORIDE 0.9 % IV SOLN
10.0000 mg | Freq: Once | INTRAVENOUS | Status: AC
  Administered 2021-05-04: 10 mg via INTRAVENOUS
  Filled 2021-05-04: qty 10

## 2021-05-04 MED ORDER — PALONOSETRON HCL INJECTION 0.25 MG/5ML
0.2500 mg | Freq: Once | INTRAVENOUS | Status: AC
  Administered 2021-05-04: 0.25 mg via INTRAVENOUS
  Filled 2021-05-04: qty 5

## 2021-05-04 MED ORDER — SODIUM CHLORIDE 0.9 % IV SOLN
15.0000 mg/kg | Freq: Once | INTRAVENOUS | Status: AC
  Administered 2021-05-04: 1600 mg via INTRAVENOUS
  Filled 2021-05-04: qty 64

## 2021-05-04 MED ORDER — SODIUM CHLORIDE 0.9% FLUSH
10.0000 mL | INTRAVENOUS | Status: DC | PRN
  Administered 2021-05-04: 10 mL

## 2021-05-04 MED ORDER — SODIUM CHLORIDE 0.9 % IV SOLN
500.0000 mg/m2 | Freq: Once | INTRAVENOUS | Status: AC
  Administered 2021-05-04: 1100 mg via INTRAVENOUS
  Filled 2021-05-04: qty 40

## 2021-05-04 MED ORDER — SODIUM CHLORIDE 0.9 % IV SOLN
624.0000 mg | Freq: Once | INTRAVENOUS | Status: AC
  Administered 2021-05-04: 620 mg via INTRAVENOUS
  Filled 2021-05-04: qty 62

## 2021-05-04 MED ORDER — HEPARIN SOD (PORK) LOCK FLUSH 100 UNIT/ML IV SOLN
500.0000 [IU] | Freq: Once | INTRAVENOUS | Status: AC | PRN
  Administered 2021-05-04: 500 [IU]

## 2021-05-04 MED ORDER — SODIUM CHLORIDE 0.9 % IV SOLN: Freq: Once | INTRAVENOUS | Status: AC

## 2021-05-04 MED ORDER — CYANOCOBALAMIN 1000 MCG/ML IJ SOLN
1000.0000 ug | Freq: Once | INTRAMUSCULAR | Status: AC
  Administered 2021-05-04: 1000 ug via INTRAMUSCULAR
  Filled 2021-05-04: qty 1

## 2021-05-04 NOTE — Progress Notes (Signed)
[facility] [phone]   [phone]  OFFICE PROGRESS NOTE  Carlena Hurl, PA-C [place] [id number]  DIAGNOSIS: Stage IB (T2, N0, M0) unresectable right malignant pleural mesothelioma.,  Epithelioid type diagnosed in June 2022.  PRIOR THERAPY: Status post right VATS with pleural biopsy and talc pleurodesis under the care of Dr. Roxan Hockey on 02/27/2021.  CURRENT THERAPY: Systemic chemotherapy with carboplatin for AUC of 5, Alimta 500 Mg/M2 and Avastin 15 mg/KG every 3 weeks.  First dose March 23, 2021.  Status post 2 cycles  INTERVAL HISTORY: Guy Evans 70 y.o. male returns to the clinic today for follow-up visit.  The patient is feeling fine today with no concerning complaints except for mild fatigue.  He had some swollen glands after the last treatment and he was seen at the emergency department and was assured of no concerning abnormalities here.  He denied having any current chest pain, shortness of breath, cough or hemoptysis.  He denied having any fever or chills.  He has no nausea, vomiting, diarrhea but has constipation.  He is currently using stool softener.  He denied having any recent weight loss or night sweats.  The patient is here today for evaluation before starting cycle #3.  He has a lot of questions.    MEDICAL HISTORY: Past Medical History:  Diagnosis Date   Arthritis    hands   Enlarged prostate    takes Terazosin daily   Gastroparesis    GERD (gastroesophageal reflux disease)    occasionally but no meds required   H/O cardiovascular stress test 10/2019   nuclear, normal, Dr. Fransico Him   History of colon polyps    History of gastric ulcer    Joint pain    knees and ankles   Urinary frequency     ALLERGIES:  has No Known Allergies.  MEDICATIONS:  Current Outpatient Medications  Medication Sig Dispense Refill   acetaminophen (TYLENOL) 500 MG tablet Take 2 tablets (1,000 mg total) by  mouth every 6 (six) hours as needed. 30 tablet 0   albuterol (VENTOLIN HFA) 108 (90 Base) MCG/ACT inhaler Inhale 2 puffs into the lungs every 6 (six) hours as needed for wheezing or shortness of breath. (Patient not taking: No sig reported) 8 g 0   aspirin 81 MG EC tablet Take 81 mg by mouth daily.     atorvastatin (LIPITOR) 80 MG tablet Take 1 tablet (80 mg total) by mouth daily. 30 tablet 2   dexamethasone (DECADRON) 4 MG tablet 4 mg p.o. twice daily the day before, day of and day after chemotherapy every 3 weeks 40 tablet 1   Fluticasone-Umeclidin-Vilant (TRELEGY ELLIPTA) 100-62.5-25 MCG/INH AEPB Inhale 1 puff into the lungs daily. (Patient not taking: No sig reported) 28 each 5   folic acid (FOLVITE) 1 MG tablet Take 1 tablet (1 mg total) by mouth daily. 30 tablet 4   glucosamine-chondroitin 500-400 MG tablet Take 1 tablet by mouth daily.     L-Lysine 1000 MG TABS Take 1-2 tablets by mouth as needed (Daily as needed).     lidocaine-prilocaine (EMLA) cream Apply to the Port-A-Cath site 30-60-minute before chemotherapy. 30 g 0   magic mouthwash SOLN Take 5 mLs by mouth 3 (three) times daily as needed for mouth pain. 240 mL 0   omeprazole (PRILOSEC) 20 MG capsule Take 20 mg by mouth daily.     oxyCODONE (OXY IR/ROXICODONE) 5 MG immediate release tablet Take 1-2  tablets (5-10 mg total) by mouth every 4 (four) hours as needed for moderate pain. (Patient not taking: No sig reported) 30 tablet 0   phenylephrine (NEO-SYNEPHRINE) 1 % nasal spray Place 1 drop into both nostrils at bedtime as needed for congestion.     prochlorperazine (COMPAZINE) 10 MG tablet Take 1 tablet (10 mg total) by mouth every 6 (six) hours as needed for nausea or vomiting. 30 tablet 0   terazosin (HYTRIN) 10 MG capsule TAKE 1 CAPSULE BY MOUTH EVERY DAY 90 capsule 0   Turmeric (QC TUMERIC COMPLEX PO) Take 450 mg by mouth daily.     No current facility-administered medications for this visit.    SURGICAL HISTORY:  Past  Surgical History:  Procedure Laterality Date   CHOLECYSTECTOMY N/A 11/19/2013   Procedure: LAPAROSCOPIC CHOLECYSTECTOMY;  Surgeon: Harl Bowie, MD;  Location: [facility];  Service: General;  Laterality: N/A;   COLONOSCOPY     DIAGNOSTIC LAPAROSCOPY     lap chole   ESOPHAGOGASTRODUODENOSCOPY     IR IMAGING GUIDED PORT INSERTION  03/11/2021   skin spots removed and tested     done in the office   THORACENTESIS N/A 01/14/2021   Procedure: THORACENTESIS;  Surgeon: Collene Gobble, MD;  Location: [facility];  Service: Cardiopulmonary;  Laterality: N/A;   THORACENTESIS N/A 01/23/2021   Procedure: THORACENTESIS;  Surgeon: Collene Gobble, MD;  Location: [facility] ENDOSCOPY;  Service: Cardiopulmonary;  Laterality: N/A;   VIDEO ASSISTED THORACOSCOPY (VATS) W/TALC PLEUADESIS Right 02/27/2021   Procedure: VIDEO ASSISTED THORACOSCOPY (VATS) WITH  TALC PLEUADESIS and PLEURAL BIOPSY;  Surgeon: Melrose Nakayama, MD;  Location: [facility];  Service: Thoracic;  Laterality: Right;    REVIEW OF SYSTEMS:  Constitutional: positive for fatigue Eyes: negative Ears, nose, mouth, throat, and face: negative Respiratory: negative Cardiovascular: negative Gastrointestinal: negative Genitourinary:negative Integument/breast: negative Hematologic/lymphatic: negative Musculoskeletal:negative Neurological: negative Behavioral/Psych: negative Endocrine: negative Allergic/Immunologic: negative   PHYSICAL EXAMINATION: General appearance: alert, cooperative, fatigued, and no distress Head: Normocephalic, without obvious abnormality, atraumatic Neck: no adenopathy, no JVD, supple, symmetrical, trachea midline, and thyroid not enlarged, symmetric, no tenderness/mass/nodules Lymph nodes: Cervical, supraclavicular, and axillary nodes normal. Resp: diminished breath sounds RLL and dullness to percussion RLL Back: symmetric, no curvature. ROM normal. No CVA tenderness. Cardio: regular rate and rhythm, S1, S2 normal, no  murmur, click, rub or gallop GI: soft, non-tender; bowel sounds normal; no masses,  no organomegaly Extremities: extremities normal, atraumatic, no cyanosis or edema Neurologic: Alert and oriented X 3, normal strength and tone. Normal symmetric reflexes. Normal coordination and gait  ECOG PERFORMANCE STATUS: 1 - Symptomatic but completely ambulatory  Blood pressure (!) 154/78, pulse 93, temperature 97.6 F (36.4 C), temperature source Tympanic, resp. rate 20, height 5\' 8"  (1.727 m), weight 228 lb 8 oz (103.6 kg), SpO2 96 %.  LABORATORY DATA: Lab Results  Component Value Date   WBC 7.9 05/04/2021   HGB 11.8 (L) 05/04/2021   HCT 34.8 (L) 05/04/2021   MCV 90.4 05/04/2021   PLT 252 05/04/2021      Chemistry      Component Value Date/Time   NA 140 04/27/2021 1115   NA 141 10/20/2020 1030   K 3.9 04/27/2021 1115   CL 104 04/27/2021 1115   CO2 28 04/27/2021 1115   BUN 13 04/27/2021 1115   BUN 13 10/20/2020 1030   CREATININE 0.88 04/27/2021 1115   CREATININE 1.03 10/18/2016 1027      Component Value Date/Time   CALCIUM 9.6  04/27/2021 1115   ALKPHOS 100 04/27/2021 1115   AST 19 04/27/2021 1115   ALT 18 04/27/2021 1115   BILITOT 0.5 04/27/2021 1115       RADIOGRAPHIC STUDIES: No results found.  ASSESSMENT AND PLAN: This is a very pleasant 70 years old white male recently diagnosed with unresectable stage IB (T2, N0, M0) malignant pleural mesothelioma, epithelioid type diagnosed and June 2022 and presented with extensive involvement of the right hemithorax. The patient is status post VATS with pleural biopsies and talc pleurodesis in June 2022 under the care of Dr. Roxan Hockey. The patient is currently on systemic chemotherapy with carboplatin for AUC of 5, Alimta 500 Mg/M2 and Avastin 15 Mg/KG every 3 weeks.  First dose March 23, 2021.  Status post 2 cycles. The patient has been tolerating this treatment well with no concerning adverse effects. He had several questions today  regarding the chemotherapy and the frequency and why is not giving on weekly basis rather than every 3 weeks.  He also has question about the swollen gland and constipation as well as other cancer center in their experience with this condition. I answered his questions completely to his satisfaction. I recommended for the patient to proceed with cycle #3 of his treatment today. I will see him back for follow-up visit in 3 weeks for evaluation with repeat CT scan of the chest for restaging of his disease. For the constipation he will continue with the stool softener and MiraLAX and if needed milk of magnesia. The patient was advised to call immediately if he has any concerning symptoms in the interval. The patient voices understanding of current disease status and treatment options and is in agreement with the current care plan.  All questions were answered. The patient knows to call the clinic with any problems, questions or concerns. We can certainly see the patient much sooner if necessary. The total time spent in the appointment was 35 minutes.  Disclaimer: This note was dictated with voice recognition software. Similar sounding words can inadvertently be transcribed and may not be corrected upon review.

## 2021-05-04 NOTE — Patient Instructions (Signed)
[facility] ONCOLOGY  Discharge Instructions: Thank you for choosing [facility] to provide your oncology and hematology care.   If you have a lab appointment with the [facility], please go directly to the [facility] and check in at the registration area.   Wear comfortable clothing and clothing appropriate for easy access to any Portacath or PICC line.   We strive to give you quality time with your provider. You may need to reschedule your appointment if you arrive late (15 or more minutes).  Arriving late affects you and other patients whose appointments are after yours.  Also, if you miss three or more appointments without notifying the office, you may be dismissed from the clinic at the provider's discretion.      For prescription refill requests, have your pharmacy contact our office and allow 72 hours for refills to be completed.    Today you received the following chemotherapy and/or immunotherapy agents zirabev/alimta/carboplatin      To help prevent nausea and vomiting after your treatment, we encourage you to take your nausea medication as directed.  BELOW ARE SYMPTOMS THAT SHOULD BE REPORTED IMMEDIATELY: *FEVER GREATER THAN 100.4 F (38 C) OR HIGHER *CHILLS OR SWEATING *NAUSEA AND VOMITING THAT IS NOT CONTROLLED WITH YOUR NAUSEA MEDICATION *UNUSUAL SHORTNESS OF BREATH *UNUSUAL BRUISING OR BLEEDING *URINARY PROBLEMS (pain or burning when urinating, or frequent urination) *BOWEL PROBLEMS (unusual diarrhea, constipation, pain near the anus) TENDERNESS IN MOUTH AND THROAT WITH OR WITHOUT PRESENCE OF ULCERS (sore throat, sores in mouth, or a toothache) UNUSUAL RASH, SWELLING OR PAIN  UNUSUAL VAGINAL DISCHARGE OR ITCHING   Items with * indicate a potential emergency and should be followed up as soon as possible or go to the Emergency Department if any problems should occur.  Please show the CHEMOTHERAPY ALERT CARD or IMMUNOTHERAPY ALERT CARD  at check-in to the Emergency Department and triage nurse.  Should you have questions after your visit or need to cancel or reschedule your appointment, please contact [facility]  Dept: [phone]  and follow the prompts.  Office hours are 8:00 a.m. to 4:30 p.m. Monday - Friday. Please note that voicemails left after 4:00 p.m. may not be returned until the following business day.  We are closed weekends and major holidays. You have access to a nurse at all times for urgent questions. Please call the main number to the clinic Dept: [phone] and follow the prompts.   For any non-urgent questions, you may also contact your provider using MyChart. We now offer e-Visits for anyone 40 and older to request care online for non-urgent symptoms. For details visit [email or web address].   Also download the MyChart app! Go to the app store, search "MyChart", open the app, select [facility], and log in with your MyChart username and password.  Due to Covid, a mask is required upon entering the hospital/clinic. If you do not have a mask, one will be given to you upon arrival. For doctor visits, patients may have 1 support person aged 26 or older with them. For treatment visits, patients cannot have anyone with them due to current Covid guidelines and our immunocompromised population.

## 2021-05-11 ENCOUNTER — Encounter: Payer: Self-pay | Admitting: Internal Medicine

## 2021-05-12 ENCOUNTER — Other Ambulatory Visit: Payer: Self-pay

## 2021-05-12 ENCOUNTER — Inpatient Hospital Stay: Payer: Medicare HMO | Attending: Internal Medicine

## 2021-05-12 DIAGNOSIS — K3184 Gastroparesis: Secondary | ICD-10-CM | POA: Diagnosis not present

## 2021-05-12 DIAGNOSIS — Z5111 Encounter for antineoplastic chemotherapy: Secondary | ICD-10-CM | POA: Insufficient documentation

## 2021-05-12 DIAGNOSIS — Z8719 Personal history of other diseases of the digestive system: Secondary | ICD-10-CM | POA: Diagnosis not present

## 2021-05-12 DIAGNOSIS — N400 Enlarged prostate without lower urinary tract symptoms: Secondary | ICD-10-CM | POA: Diagnosis not present

## 2021-05-12 DIAGNOSIS — K7689 Other specified diseases of liver: Secondary | ICD-10-CM | POA: Diagnosis not present

## 2021-05-12 DIAGNOSIS — Z7982 Long term (current) use of aspirin: Secondary | ICD-10-CM | POA: Insufficient documentation

## 2021-05-12 DIAGNOSIS — Z86010 Personal history of colonic polyps: Secondary | ICD-10-CM | POA: Insufficient documentation

## 2021-05-12 DIAGNOSIS — K219 Gastro-esophageal reflux disease without esophagitis: Secondary | ICD-10-CM | POA: Insufficient documentation

## 2021-05-12 DIAGNOSIS — I700 Atherosclerosis of aorta: Secondary | ICD-10-CM | POA: Insufficient documentation

## 2021-05-12 DIAGNOSIS — Z79899 Other long term (current) drug therapy: Secondary | ICD-10-CM | POA: Insufficient documentation

## 2021-05-12 DIAGNOSIS — Z95828 Presence of other vascular implants and grafts: Secondary | ICD-10-CM

## 2021-05-12 DIAGNOSIS — Z8711 Personal history of peptic ulcer disease: Secondary | ICD-10-CM | POA: Insufficient documentation

## 2021-05-12 DIAGNOSIS — M19041 Primary osteoarthritis, right hand: Secondary | ICD-10-CM | POA: Insufficient documentation

## 2021-05-12 DIAGNOSIS — J439 Emphysema, unspecified: Secondary | ICD-10-CM | POA: Diagnosis not present

## 2021-05-12 DIAGNOSIS — C450 Mesothelioma of pleura: Secondary | ICD-10-CM | POA: Insufficient documentation

## 2021-05-12 DIAGNOSIS — M19042 Primary osteoarthritis, left hand: Secondary | ICD-10-CM | POA: Insufficient documentation

## 2021-05-12 LAB — CBC WITH DIFFERENTIAL (CANCER CENTER ONLY)
Abs Immature Granulocytes: 0.01 10*3/uL (ref 0.00–0.07)
Basophils Absolute: 0 10*3/uL (ref 0.0–0.1)
Basophils Relative: 1 %
Eosinophils Absolute: 0.1 10*3/uL (ref 0.0–0.5)
Eosinophils Relative: 1 %
HCT: 31.9 % — ABNORMAL LOW (ref 39.0–52.0)
Hemoglobin: 10.8 g/dL — ABNORMAL LOW (ref 13.0–17.0)
Immature Granulocytes: 0 %
Lymphocytes Relative: 24 %
Lymphs Abs: 0.9 10*3/uL (ref 0.7–4.0)
MCH: 31.4 pg (ref 26.0–34.0)
MCHC: 33.9 g/dL (ref 30.0–36.0)
MCV: 92.7 fL (ref 80.0–100.0)
Monocytes Absolute: 0.4 10*3/uL (ref 0.1–1.0)
Monocytes Relative: 10 %
Neutro Abs: 2.4 10*3/uL (ref 1.7–7.7)
Neutrophils Relative %: 64 %
Platelet Count: 131 10*3/uL — ABNORMAL LOW (ref 150–400)
RBC: 3.44 MIL/uL — ABNORMAL LOW (ref 4.22–5.81)
RDW: 15.9 % — ABNORMAL HIGH (ref 11.5–15.5)
WBC Count: 3.7 10*3/uL — ABNORMAL LOW (ref 4.0–10.5)
nRBC: 0 % (ref 0.0–0.2)

## 2021-05-12 LAB — CMP (CANCER CENTER ONLY)
ALT: 20 U/L (ref 0–44)
AST: 17 U/L (ref 15–41)
Albumin: 3.3 g/dL — ABNORMAL LOW (ref 3.5–5.0)
Alkaline Phosphatase: 101 U/L (ref 38–126)
Anion gap: 8 (ref 5–15)
BUN: 12 mg/dL (ref 8–23)
CO2: 25 mmol/L (ref 22–32)
Calcium: 9.1 mg/dL (ref 8.9–10.3)
Chloride: 104 mmol/L (ref 98–111)
Creatinine: 0.76 mg/dL (ref 0.61–1.24)
GFR, Estimated: >60 mL/min (ref >60)
Glucose, Bld: 117 mg/dL — ABNORMAL HIGH (ref 70–99)
Potassium: 4 mmol/L (ref 3.5–5.1)
Sodium: 137 mmol/L (ref 135–145)
Total Bilirubin: 0.4 mg/dL (ref 0.3–1.2)
Total Protein: 6.2 g/dL — ABNORMAL LOW (ref 6.5–8.1)

## 2021-05-12 MED ORDER — SODIUM CHLORIDE 0.9% FLUSH
10.0000 mL | Freq: Once | INTRAVENOUS | Status: AC
  Administered 2021-05-12: 10 mL

## 2021-05-12 MED ORDER — HEPARIN SOD (PORK) LOCK FLUSH 100 UNIT/ML IV SOLN
500.0000 [IU] | Freq: Once | INTRAVENOUS | Status: AC
  Administered 2021-05-12: 500 [IU]

## 2021-05-18 ENCOUNTER — Other Ambulatory Visit: Payer: Self-pay

## 2021-05-18 ENCOUNTER — Inpatient Hospital Stay: Payer: Medicare HMO

## 2021-05-18 DIAGNOSIS — Z95828 Presence of other vascular implants and grafts: Secondary | ICD-10-CM

## 2021-05-18 DIAGNOSIS — I700 Atherosclerosis of aorta: Secondary | ICD-10-CM | POA: Diagnosis not present

## 2021-05-18 DIAGNOSIS — K3184 Gastroparesis: Secondary | ICD-10-CM | POA: Diagnosis not present

## 2021-05-18 DIAGNOSIS — K219 Gastro-esophageal reflux disease without esophagitis: Secondary | ICD-10-CM | POA: Diagnosis not present

## 2021-05-18 DIAGNOSIS — K7689 Other specified diseases of liver: Secondary | ICD-10-CM | POA: Diagnosis not present

## 2021-05-18 DIAGNOSIS — C450 Mesothelioma of pleura: Secondary | ICD-10-CM | POA: Diagnosis not present

## 2021-05-18 DIAGNOSIS — N400 Enlarged prostate without lower urinary tract symptoms: Secondary | ICD-10-CM | POA: Diagnosis not present

## 2021-05-18 DIAGNOSIS — Z5111 Encounter for antineoplastic chemotherapy: Secondary | ICD-10-CM | POA: Diagnosis not present

## 2021-05-18 DIAGNOSIS — M19041 Primary osteoarthritis, right hand: Secondary | ICD-10-CM | POA: Diagnosis not present

## 2021-05-18 DIAGNOSIS — M19042 Primary osteoarthritis, left hand: Secondary | ICD-10-CM | POA: Diagnosis not present

## 2021-05-18 DIAGNOSIS — J439 Emphysema, unspecified: Secondary | ICD-10-CM | POA: Diagnosis not present

## 2021-05-18 LAB — CMP (CANCER CENTER ONLY)
ALT: 27 U/L (ref 0–44)
AST: 23 U/L (ref 15–41)
Albumin: 3.6 g/dL (ref 3.5–5.0)
Alkaline Phosphatase: 94 U/L (ref 38–126)
Anion gap: 7 (ref 5–15)
BUN: 12 mg/dL (ref 8–23)
CO2: 26 mmol/L (ref 22–32)
Calcium: 9.5 mg/dL (ref 8.9–10.3)
Chloride: 105 mmol/L (ref 98–111)
Creatinine: 0.8 mg/dL (ref 0.61–1.24)
GFR, Estimated: >60 mL/min (ref >60)
Glucose, Bld: 95 mg/dL (ref 70–99)
Potassium: 4.5 mmol/L (ref 3.5–5.1)
Sodium: 138 mmol/L (ref 135–145)
Total Bilirubin: 0.4 mg/dL (ref 0.3–1.2)
Total Protein: 6.7 g/dL (ref 6.5–8.1)

## 2021-05-18 LAB — CBC WITH DIFFERENTIAL (CANCER CENTER ONLY)
Abs Immature Granulocytes: 0.01 10*3/uL (ref 0.00–0.07)
Basophils Absolute: 0 10*3/uL (ref 0.0–0.1)
Basophils Relative: 0 %
Eosinophils Absolute: 0 10*3/uL (ref 0.0–0.5)
Eosinophils Relative: 1 %
HCT: 33.4 % — ABNORMAL LOW (ref 39.0–52.0)
Hemoglobin: 11.4 g/dL — ABNORMAL LOW (ref 13.0–17.0)
Immature Granulocytes: 0 %
Lymphocytes Relative: 25 %
Lymphs Abs: 0.9 10*3/uL (ref 0.7–4.0)
MCH: 31.8 pg (ref 26.0–34.0)
MCHC: 34.1 g/dL (ref 30.0–36.0)
MCV: 93 fL (ref 80.0–100.0)
Monocytes Absolute: 0.6 10*3/uL (ref 0.1–1.0)
Monocytes Relative: 15 %
Neutro Abs: 2.2 10*3/uL (ref 1.7–7.7)
Neutrophils Relative %: 59 %
Platelet Count: 111 10*3/uL — ABNORMAL LOW (ref 150–400)
RBC: 3.59 MIL/uL — ABNORMAL LOW (ref 4.22–5.81)
RDW: 17.3 % — ABNORMAL HIGH (ref 11.5–15.5)
WBC Count: 3.8 10*3/uL — ABNORMAL LOW (ref 4.0–10.5)
nRBC: 0 % (ref 0.0–0.2)

## 2021-05-18 MED ORDER — SODIUM CHLORIDE 0.9% FLUSH
10.0000 mL | Freq: Once | INTRAVENOUS | Status: AC
  Administered 2021-05-18: 10 mL

## 2021-05-18 MED ORDER — HEPARIN SOD (PORK) LOCK FLUSH 100 UNIT/ML IV SOLN
500.0000 [IU] | Freq: Once | INTRAVENOUS | Status: AC
  Administered 2021-05-18: 500 [IU]

## 2021-05-21 ENCOUNTER — Other Ambulatory Visit: Payer: Self-pay

## 2021-05-21 ENCOUNTER — Encounter (HOSPITAL_COMMUNITY): Payer: Self-pay

## 2021-05-21 ENCOUNTER — Ambulatory Visit (HOSPITAL_COMMUNITY)
Admission: RE | Admit: 2021-05-21 | Discharge: 2021-05-21 | Disposition: A | Discharge status: Home / Self Care | Payer: Medicare HMO | Source: Ambulatory Visit | Attending: Internal Medicine | Admitting: Internal Medicine

## 2021-05-21 ENCOUNTER — Ambulatory Visit: Admission: RE | Admit: 2021-05-21 | Payer: Self-pay | Source: Ambulatory Visit

## 2021-05-21 DIAGNOSIS — R918 Other nonspecific abnormal finding of lung field: Secondary | ICD-10-CM | POA: Diagnosis not present

## 2021-05-21 DIAGNOSIS — R911 Solitary pulmonary nodule: Secondary | ICD-10-CM | POA: Diagnosis not present

## 2021-05-21 DIAGNOSIS — I700 Atherosclerosis of aorta: Secondary | ICD-10-CM | POA: Diagnosis not present

## 2021-05-21 DIAGNOSIS — Z77090 Contact with and (suspected) exposure to asbestos: Secondary | ICD-10-CM | POA: Diagnosis present

## 2021-05-21 DIAGNOSIS — J439 Emphysema, unspecified: Secondary | ICD-10-CM | POA: Insufficient documentation

## 2021-05-21 DIAGNOSIS — J9811 Atelectasis: Secondary | ICD-10-CM | POA: Diagnosis not present

## 2021-05-21 DIAGNOSIS — J90 Pleural effusion, not elsewhere classified: Secondary | ICD-10-CM | POA: Diagnosis not present

## 2021-05-21 MED ORDER — HEPARIN SOD (PORK) LOCK FLUSH 100 UNIT/ML IV SOLN
500.0000 [IU] | Freq: Once | INTRAVENOUS | Status: AC
  Administered 2021-05-21: 500 [IU]

## 2021-05-21 MED ORDER — HEPARIN SOD (PORK) LOCK FLUSH 100 UNIT/ML IV SOLN: 500.0000 [IU] | Freq: Once | INTRAVENOUS | Status: DC

## 2021-05-21 MED ORDER — IOHEXOL 350 MG/ML SOLN
75.0000 mL | Freq: Once | INTRAVENOUS | Status: AC | PRN
  Administered 2021-05-21: 60 mL via INTRAVENOUS

## 2021-05-21 MED ORDER — HEPARIN SOD (PORK) LOCK FLUSH 100 UNIT/ML IV SOLN
INTRAVENOUS | Status: AC
  Filled 2021-05-21: qty 5

## 2021-05-22 NOTE — Progress Notes (Signed)
[facility] OFFICE PROGRESS NOTE  Carlena Hurl, PA-C [place] [address]  DIAGNOSIS: Stage IB (T2, N0, M0) unresectable right malignant pleural mesothelioma.,  Epithelioid type diagnosed in June 2022.  PRIOR THERAPY: Status post right VATS with pleural biopsy and talc pleurodesis under the care of Dr. Roxan Hockey on 02/27/2021.  CURRENT THERAPY: Systemic chemotherapy with carboplatin for AUC of 5, Alimta 500 Mg/M2 and Avastin 15 mg/KG every 3 weeks.  First dose March 23, 2021. Status post 3 cycles.   INTERVAL HISTORY: Guy Evans 70 y.o. male returns to the clinic today for a follow-up visit. The patient is feeling "pretty good" today without any concerning complaints.  The patient was recently diagnosed with mesothelioma.  He is status post his 3 cycles of treatment and he is tolerating it fairly well except for some fatigue and constipation following treatment. He started taking a stool softener a few days ago to prepare for having constipation following treatment today. He also has milk of magnesia if needed. He denies any recent fever, chills, night sweats, or unexplained weight loss. He reports his baseline shortness of breath with exertion but notes that he is able to walk around the clinic without significant shortness of breath. He denies unusual cough, hemoptysis, or chest pain.  He denies any nausea, vomiting, or diarrhea.  He denies headaches and visual changes. He had mouth ulcers following treatment last month for which he has biotene and magic mouth wash if needed. His magic mouth wash expired and he is requesting a refill. He denies any abnormal bleeding. The patient recently had a restaging CT scan performed.  He is here today for evaluation to review his scan results before starting cycle #4.  MEDICAL HISTORY: Past Medical History:  Diagnosis Date   Arthritis    hands   Enlarged prostate    takes Terazosin daily   Gastroparesis    GERD  (gastroesophageal reflux disease)    occasionally but no meds required   H/O cardiovascular stress test 10/2019   nuclear, normal, Dr. Fransico Him   History of colon polyps    History of gastric ulcer    Joint pain    knees and ankles   Urinary frequency     ALLERGIES:  has No Known Allergies.  MEDICATIONS:  Current Outpatient Medications  Medication Sig Dispense Refill   acetaminophen (TYLENOL) 500 MG tablet Take 2 tablets (1,000 mg total) by mouth every 6 (six) hours as needed. 30 tablet 0   aspirin 81 MG EC tablet Take 81 mg by mouth daily.     atorvastatin (LIPITOR) 80 MG tablet Take 1 tablet (80 mg total) by mouth daily. 30 tablet 2   dexamethasone (DECADRON) 4 MG tablet 4 mg p.o. twice daily the day before, day of and day after chemotherapy every 3 weeks 40 tablet 1   folic acid (FOLVITE) 1 MG tablet Take 1 tablet (1 mg total) by mouth daily. 30 tablet 4   glucosamine-chondroitin 500-400 MG tablet Take 1 tablet by mouth daily.     L-Lysine 1000 MG TABS Take 1-2 tablets by mouth as needed (Daily as needed).     lidocaine-prilocaine (EMLA) cream Apply to the Port-A-Cath site 30-60-minute before chemotherapy. 30 g 0   omeprazole (PRILOSEC) 20 MG capsule Take 20 mg by mouth daily.     phenylephrine (NEO-SYNEPHRINE) 1 % nasal spray Place 1 drop into both nostrils at bedtime as needed for congestion.     prochlorperazine (COMPAZINE) 10  MG tablet Take 1 tablet (10 mg total) by mouth every 6 (six) hours as needed for nausea or vomiting. 30 tablet 0   terazosin (HYTRIN) 10 MG capsule TAKE 1 CAPSULE BY MOUTH EVERY DAY 90 capsule 0   Turmeric (QC TUMERIC COMPLEX PO) Take 450 mg by mouth daily.     albuterol (VENTOLIN HFA) 108 (90 Base) MCG/ACT inhaler Inhale 2 puffs into the lungs every 6 (six) hours as needed for wheezing or shortness of breath. (Patient not taking: No sig reported) 8 g 0   Fluticasone-Umeclidin-Vilant (TRELEGY ELLIPTA) 100-62.5-25 MCG/INH AEPB Inhale 1 puff into the lungs  daily. (Patient not taking: No sig reported) 28 each 5   magic mouthwash SOLN Take 5 mLs by mouth 3 (three) times daily as needed for mouth pain. 240 mL 0   oxyCODONE (OXY IR/ROXICODONE) 5 MG immediate release tablet Take 1-2 tablets (5-10 mg total) by mouth every 4 (four) hours as needed for moderate pain. (Patient not taking: No sig reported) 30 tablet 0   No current facility-administered medications for this visit.   Facility-Administered Medications Ordered in Other Visits  Medication Dose Route Frequency Provider Last Rate Last Admin   bevacizumab-bvzr (ZIRABEV) 1,600 mg in sodium chloride 0.9 % 100 mL chemo infusion  15 mg/kg (Treatment Plan Recorded) Intravenous Once Curt Bears, MD       CARBOplatin (PARAPLATIN) 620 mg in sodium chloride 0.9 % 250 mL chemo infusion  620 mg Intravenous Once Curt Bears, MD       dexamethasone (DECADRON) 10 mg in sodium chloride 0.9 % 50 mL IVPB  10 mg Intravenous Once Curt Bears, MD       fosaprepitant (EMEND) 150 mg in sodium chloride 0.9 % 145 mL IVPB  150 mg Intravenous Once Curt Bears, MD       heparin lock flush 100 unit/mL  500 Units Intracatheter Once PRN Curt Bears, MD       palonosetron (ALOXI) injection 0.25 mg  0.25 mg Intravenous Once Curt Bears, MD       PEMEtrexed (ALIMTA) 1,100 mg in sodium chloride 0.9 % 100 mL chemo infusion  500 mg/m2 (Treatment Plan Recorded) Intravenous Once Curt Bears, MD       sodium chloride flush (NS) 0.9 % injection 10 mL  10 mL Intracatheter PRN Curt Bears, MD        SURGICAL HISTORY:  Past Surgical History:  Procedure Laterality Date   CHOLECYSTECTOMY N/A 11/19/2013   Procedure: LAPAROSCOPIC CHOLECYSTECTOMY;  Surgeon: Harl Bowie, MD;  Location: [facility];  Service: General;  Laterality: N/A;   COLONOSCOPY     DIAGNOSTIC LAPAROSCOPY     lap chole   ESOPHAGOGASTRODUODENOSCOPY     IR IMAGING GUIDED PORT INSERTION  03/11/2021   skin spots removed and tested      done in the office   THORACENTESIS N/A 01/14/2021   Procedure: THORACENTESIS;  Surgeon: Collene Gobble, MD;  Location: [facility];  Service: Cardiopulmonary;  Laterality: N/A;   THORACENTESIS N/A 01/23/2021   Procedure: THORACENTESIS;  Surgeon: Collene Gobble, MD;  Location: [facility]COPY;  Service: Cardiopulmonary;  Laterality: N/A;   VIDEO ASSISTED THORACOSCOPY (VATS) W/TALC PLEUADESIS Right 02/27/2021   Procedure: VIDEO ASSISTED THORACOSCOPY (VATS) WITH  TALC PLEUADESIS and PLEURAL BIOPSY;  Surgeon: Melrose Nakayama, MD;  Location: [facility];  Service: Thoracic;  Laterality: Right;    REVIEW OF SYSTEMS:   Review of Systems  Constitutional: Positive for fatigue after treatment.  Negative for appetite change,  chills, fever and unexpected weight change.  HENT:  Positive for mouth sores (resolved ). Negative for nosebleeds, sore throat and trouble swallowing.   Eyes: Negative for eye problems and icterus.  Respiratory: Positive for baseline dyspnea on exertion.  Negative for cough, hemoptysis, and wheezing.   Cardiovascular: Negative for chest pain and leg swelling.  Gastrointestinal:  Positive for constipation (resolved). Negative for abdominal pain, diarrhea, nausea and vomiting.  Genitourinary: Negative for bladder incontinence, difficulty urinating, dysuria, frequency and hematuria.   Musculoskeletal: Negative for back pain, gait problem, neck pain and neck stiffness.  Skin: Negative for itching and rash.  Neurological: Negative for dizziness, extremity weakness, gait problem, headaches, light-headedness and seizures.  Hematological: Negative for adenopathy. Does not bruise/bleed easily.  Psychiatric/Behavioral: Negative for confusion, depression and sleep disturbance. The patient is not nervous/anxious.    PHYSICAL EXAMINATION:  Blood pressure (!) 143/72, pulse 69, temperature 97.6 F (36.4 C), temperature source Tympanic, resp. rate 18, weight 231 lb 6 oz (105 kg), SpO2 98  %.  ECOG PERFORMANCE STATUS: 1  Physical Exam  Constitutional: Oriented to person, place, and time and well-developed, well-nourished, and in no distress.  HENT:  Head: Normocephalic and atraumatic.  Mouth/Throat: Oropharynx is clear and moist. No oropharyngeal exudate.  Eyes: Conjunctivae are normal. Right eye exhibits no discharge. Left eye exhibits no discharge. No scleral icterus.  Neck: Normal range of motion. Neck supple.  Cardiovascular: Normal rate, regular rhythm, normal heart sounds and intact distal pulses.   Pulmonary/Chest: Effort normal.  Decreased breath sounds in the right lung.  No respiratory distress. No wheezes. No rales.  Abdominal: Soft. Bowel sounds are normal. Exhibits no distension and no mass. There is no tenderness.  Musculoskeletal: Normal range of motion. Exhibits no edema.  Lymphadenopathy:    No cervical adenopathy.  Neurological: Alert and oriented to person, place, and time. Exhibits normal muscle tone. Gait normal. Coordination normal.  Skin: Skin is warm and dry. No rash noted. Not diaphoretic. No erythema. No pallor.  Psychiatric: Mood, memory and judgment normal.  Vitals reviewed.  LABORATORY DATA: Lab Results  Component Value Date   WBC 4.7 05/25/2021   HGB 11.2 (L) 05/25/2021   HCT 33.1 (L) 05/25/2021   MCV 93.5 05/25/2021   PLT 217 05/25/2021      Chemistry      Component Value Date/Time   NA 138 05/25/2021 1018   NA 141 10/20/2020 1030   K 4.0 05/25/2021 1018   CL 105 05/25/2021 1018   CO2 25 05/25/2021 1018   BUN 13 05/25/2021 1018   BUN 13 10/20/2020 1030   CREATININE 0.80 05/25/2021 1018   CREATININE 1.03 10/18/2016 1027      Component Value Date/Time   CALCIUM 9.7 05/25/2021 1018   ALKPHOS 87 05/25/2021 1018   AST 16 05/25/2021 1018   ALT 18 05/25/2021 1018   BILITOT 0.5 05/25/2021 1018       RADIOGRAPHIC STUDIES:  CT Chest W Contrast  Result Date: 05/23/2021 CLINICAL DATA:  Mesothelioma. EXAM: CT CHEST WITH  CONTRAST TECHNIQUE: Multidetector CT imaging of the chest was performed during intravenous contrast administration. CONTRAST:  59mL OMNIPAQUE IOHEXOL 350 MG/ML SOLN COMPARISON:  CT scan 01/14/2021 and PET-CT 02/05/2021 FINDINGS: Cardiovascular: The heart is normal in size. No pericardial effusion. The aorta is normal in caliber. No dissection. Moderate atherosclerotic calcifications. Significant/advanced three-vessel coronary artery calcifications are noted. Mediastinum/Nodes: Small scattered mediastinal and hilar lymph nodes but no mass or overt adenopathy. The esophagus is grossly normal. Lungs/Pleura:  Interval evacuation of the right pleural effusion demonstrated on the prior studies. Small amount of residual pleural fluid noted at the right base. New pleural calcifications likely due to talc pleurodesis. Numerous pleural nodules are studding the right pleural surface. Some of these are calcified. These are much more obvious now that the effusion and atelectasis has resolved. Based on the PET-CT I suspect overall improvement but difficult to measure nodules on the prior studies. 12 mm pleural nodule medially at the level of the aortic arch on image number 48/5. 10 mm pleural nodule in the right upper lobe laterally on image 58/5. Elongated plaque-like area pleural thickening in the medial pleura of the right upper lobe measures 22 mm on image 61/5 Irregular pleural and subpleural nodular lesion measures 17 mm on image number 72/5. 12 mm pleural nodule in the right middle lobe on image number 97/5 The lungs demonstrate underlying emphysematous changes with apical bulla. No pleural disease involving the left hemithorax. No left-sided pulmonary nodules. Upper Abdomen: No significant upper abdominal findings. Stable aortic calcifications. Small hepatic cysts are noted. Musculoskeletal: No significant bony findings. IMPRESSION: 1. Interval evacuation of the right pleural effusion with a small amount of residual  pleural fluid at the right base. Status post talc pleurodesis. 2. Numerous pleural nodules studding the right pleural surface consistent with known mesothelioma. Difficult to directly compare to the prior studies due to obscuration by pleural fluid and atelectasis. Index lesions measured above for future comparison. 3. No mediastinal or hilar mass or adenopathy. 4. Emphysematous changes and pulmonary scarring. 5. Significant/advanced three-vessel coronary artery calcifications. Aortic Atherosclerosis (ICD10-I70.0) and Emphysema (ICD10-J43.9). Electronically Signed   By: Marijo Sanes M.D.   On: 05/23/2021 09:54     ASSESSMENT/PLAN:  This is a very pleasant 70 year old Caucasian male diagnosed with unresectable stage Ib (T2, N0, M0) malignant pleural mesothelioma epithelioid type.  He was diagnosed in June 2022.  He presented with extensive involvement of the right hemithorax.   Patient underwent VATS with pleural biopsy and Talc pleurodesis in June 2022 under the care of Dr. Roxan Hockey.  This was performed on 02/27/2021.  The patient is currently undergoing systemic chemotherapy with carboplatin for an AUC of 5, Alimta 500 mg per metered squared, and Avastin 15 mg/kg IV every 3 weeks.  The patient is status post 3 cycles of treatment.  The patient recently had a restaging CT scan performed.  Dr. Julien Nordmann personally and independently reviewed the scan and discussed the results with the patient today.  The scan showed no evidence of disease progression.   Recommend that he proceed with cycle #4 today scheduled.  We will see him back for follow-up visit in 3 weeks for evaluation before starting cycle #5.  He will use stool softener and laxatives if needed for constipation.   We will refill his magic mouthwash.   The patient was advised to call immediately if he has any concerning symptoms in the interval. The patient voices understanding of current disease status and treatment options and is in  agreement with the current care plan. All questions were answered. The patient knows to call the clinic with any problems, questions or concerns. We can certainly see the patient much sooner if necessary      No orders of the defined types were placed in this encounter.    Hadiyah Maricle L Yazmyne Sara, PA-C 05/25/21  ADDENDUM: Hematology/Oncology Attending: I had a face-to-face encounter with the patient today.  I reviewed his record, lab and scan and recommended his  care plan.  This is a very pleasant 70 years old white male diagnosed with stage Ib (T2, N0, M0) malignant pleural mesothelioma, epithelioid type in June 2022 with extensive involvement of the right hemothorax s/p right VATS with pleural biopsy and talc pleurodesis under the care of Dr. Roxan Hockey.  The patient is currently undergoing systemic chemotherapy with carboplatin, Alimta and Avastin status post 3 cycles.  He has been tolerating this treatment well with no concerning adverse effects. He had repeat CT scan of the chest performed recently.  I personally and independently reviewed the scans and discussed the result with the patient today and answered his question about the findings on the scan. Has a scan showed no concerning findings for disease progression. I recommended for him to proceed with cycle #4 of his treatment with carboplatin, Alimta and Avastin today as planned. We will see the patient back for follow-up visit in 3 weeks for evaluation before the next cycle of his treatment. For the constipation he will continue on stool softener and laxative as needed. For the mouth sores, the patient was given refill of the Magic mouthwash. He was advised to call immediately if he has any other concerning symptoms in the interval.  The total time spent in the appointment was 30 minutes. Disclaimer: This note was dictated with voice recognition software. Similar sounding words can inadvertently be transcribed and may be missed  upon review. Eilleen Kempf, MD 05/25/21

## 2021-05-25 ENCOUNTER — Inpatient Hospital Stay (HOSPITAL_BASED_OUTPATIENT_CLINIC_OR_DEPARTMENT_OTHER): Payer: Medicare HMO | Admitting: Physician Assistant

## 2021-05-25 ENCOUNTER — Encounter: Payer: Self-pay | Admitting: Physician Assistant

## 2021-05-25 ENCOUNTER — Inpatient Hospital Stay: Payer: Medicare HMO

## 2021-05-25 ENCOUNTER — Other Ambulatory Visit: Payer: Self-pay

## 2021-05-25 VITALS — BP 143/72 | HR 69 | Temp 97.6°F | Resp 18 | Wt 231.4 lb

## 2021-05-25 DIAGNOSIS — N400 Enlarged prostate without lower urinary tract symptoms: Secondary | ICD-10-CM | POA: Diagnosis not present

## 2021-05-25 DIAGNOSIS — M19041 Primary osteoarthritis, right hand: Secondary | ICD-10-CM | POA: Diagnosis not present

## 2021-05-25 DIAGNOSIS — K1230 Oral mucositis (ulcerative), unspecified: Secondary | ICD-10-CM | POA: Diagnosis not present

## 2021-05-25 DIAGNOSIS — Z5111 Encounter for antineoplastic chemotherapy: Secondary | ICD-10-CM

## 2021-05-25 DIAGNOSIS — I700 Atherosclerosis of aorta: Secondary | ICD-10-CM | POA: Diagnosis not present

## 2021-05-25 DIAGNOSIS — C450 Mesothelioma of pleura: Secondary | ICD-10-CM

## 2021-05-25 DIAGNOSIS — K3184 Gastroparesis: Secondary | ICD-10-CM | POA: Diagnosis not present

## 2021-05-25 DIAGNOSIS — K219 Gastro-esophageal reflux disease without esophagitis: Secondary | ICD-10-CM | POA: Diagnosis not present

## 2021-05-25 DIAGNOSIS — K7689 Other specified diseases of liver: Secondary | ICD-10-CM | POA: Diagnosis not present

## 2021-05-25 DIAGNOSIS — J439 Emphysema, unspecified: Secondary | ICD-10-CM | POA: Diagnosis not present

## 2021-05-25 DIAGNOSIS — M19042 Primary osteoarthritis, left hand: Secondary | ICD-10-CM | POA: Diagnosis not present

## 2021-05-25 DIAGNOSIS — Z95828 Presence of other vascular implants and grafts: Secondary | ICD-10-CM

## 2021-05-25 LAB — CMP (CANCER CENTER ONLY)
ALT: 18 U/L (ref 0–44)
AST: 16 U/L (ref 15–41)
Albumin: 3.6 g/dL (ref 3.5–5.0)
Alkaline Phosphatase: 87 U/L (ref 38–126)
Anion gap: 8 (ref 5–15)
BUN: 13 mg/dL (ref 8–23)
CO2: 25 mmol/L (ref 22–32)
Calcium: 9.7 mg/dL (ref 8.9–10.3)
Chloride: 105 mmol/L (ref 98–111)
Creatinine: 0.8 mg/dL (ref 0.61–1.24)
GFR, Estimated: >60 mL/min (ref >60)
Glucose, Bld: 131 mg/dL — ABNORMAL HIGH (ref 70–99)
Potassium: 4 mmol/L (ref 3.5–5.1)
Sodium: 138 mmol/L (ref 135–145)
Total Bilirubin: 0.5 mg/dL (ref 0.3–1.2)
Total Protein: 6.8 g/dL (ref 6.5–8.1)

## 2021-05-25 LAB — CBC WITH DIFFERENTIAL (CANCER CENTER ONLY)
Abs Immature Granulocytes: 0.02 10*3/uL (ref 0.00–0.07)
Basophils Absolute: 0 10*3/uL (ref 0.0–0.1)
Basophils Relative: 0 %
Eosinophils Absolute: 0 10*3/uL (ref 0.0–0.5)
Eosinophils Relative: 0 %
HCT: 33.1 % — ABNORMAL LOW (ref 39.0–52.0)
Hemoglobin: 11.2 g/dL — ABNORMAL LOW (ref 13.0–17.0)
Immature Granulocytes: 0 %
Lymphocytes Relative: 11 %
Lymphs Abs: 0.5 10*3/uL — ABNORMAL LOW (ref 0.7–4.0)
MCH: 31.6 pg (ref 26.0–34.0)
MCHC: 33.8 g/dL (ref 30.0–36.0)
MCV: 93.5 fL (ref 80.0–100.0)
Monocytes Absolute: 0.7 10*3/uL (ref 0.1–1.0)
Monocytes Relative: 15 %
Neutro Abs: 3.4 10*3/uL (ref 1.7–7.7)
Neutrophils Relative %: 74 %
Platelet Count: 217 10*3/uL (ref 150–400)
RBC: 3.54 MIL/uL — ABNORMAL LOW (ref 4.22–5.81)
RDW: 17.6 % — ABNORMAL HIGH (ref 11.5–15.5)
WBC Count: 4.7 10*3/uL (ref 4.0–10.5)
nRBC: 0 % (ref 0.0–0.2)

## 2021-05-25 LAB — TOTAL PROTEIN, URINE DIPSTICK: Protein, ur: NEGATIVE mg/dL

## 2021-05-25 MED ORDER — SODIUM CHLORIDE 0.9 % IV SOLN: Freq: Once | INTRAVENOUS | Status: AC

## 2021-05-25 MED ORDER — PALONOSETRON HCL INJECTION 0.25 MG/5ML
0.2500 mg | Freq: Once | INTRAVENOUS | Status: AC
  Administered 2021-05-25: 0.25 mg via INTRAVENOUS
  Filled 2021-05-25: qty 5

## 2021-05-25 MED ORDER — SODIUM CHLORIDE 0.9 % IV SOLN
150.0000 mg | Freq: Once | INTRAVENOUS | Status: AC
  Administered 2021-05-25: 150 mg via INTRAVENOUS
  Filled 2021-05-25: qty 150

## 2021-05-25 MED ORDER — SODIUM CHLORIDE 0.9% FLUSH
10.0000 mL | Freq: Once | INTRAVENOUS | Status: AC
  Administered 2021-05-25: 10 mL

## 2021-05-25 MED ORDER — HEPARIN SOD (PORK) LOCK FLUSH 100 UNIT/ML IV SOLN
500.0000 [IU] | Freq: Once | INTRAVENOUS | Status: AC | PRN
  Administered 2021-05-25: 500 [IU]

## 2021-05-25 MED ORDER — SODIUM CHLORIDE 0.9 % IV SOLN
624.0000 mg | Freq: Once | INTRAVENOUS | Status: AC
  Administered 2021-05-25: 620 mg via INTRAVENOUS
  Filled 2021-05-25: qty 62

## 2021-05-25 MED ORDER — MAGIC MOUTHWASH: 5.0000 mL | Freq: Three times a day (TID) | ORAL | 0 refills | Status: DC | PRN

## 2021-05-25 MED ORDER — SODIUM CHLORIDE 0.9 % IV SOLN
15.0000 mg/kg | Freq: Once | INTRAVENOUS | Status: AC
  Administered 2021-05-25: 1600 mg via INTRAVENOUS
  Filled 2021-05-25: qty 64

## 2021-05-25 MED ORDER — SODIUM CHLORIDE 0.9% FLUSH
10.0000 mL | INTRAVENOUS | Status: DC | PRN
Start: 2021-05-25 — End: 2021-05-25
  Administered 2021-05-25: 10 mL

## 2021-05-25 MED ORDER — SODIUM CHLORIDE 0.9 % IV SOLN
10.0000 mg | Freq: Once | INTRAVENOUS | Status: AC
  Administered 2021-05-25: 10 mg via INTRAVENOUS
  Filled 2021-05-25: qty 10

## 2021-05-25 MED ORDER — SODIUM CHLORIDE 0.9 % IV SOLN
500.0000 mg/m2 | Freq: Once | INTRAVENOUS | Status: AC
  Administered 2021-05-25: 1100 mg via INTRAVENOUS
  Filled 2021-05-25: qty 40

## 2021-05-25 NOTE — Patient Instructions (Signed)
[facility]  Discharge Instructions: Thank you for choosing [facility] to provide your oncology and hematology care.   If you have a lab appointment with the [facility], please go directly to the [facility] and check in at the registration area.   Wear comfortable clothing and clothing appropriate for easy access to any Portacath or PICC line.   We strive to give you quality time with your provider. You may need to reschedule your appointment if you arrive late (15 or more minutes).  Arriving late affects you and other patients whose appointments are after yours.  Also, if you miss three or more appointments without notifying the office, you may be dismissed from the clinic at the provider's discretion.      For prescription refill requests, have your pharmacy contact our office and allow 72 hours for refills to be completed.    Today you received the following chemotherapy and/or immunotherapy agents Avastin, Alimta, and Carboplatin      To help prevent nausea and vomiting after your treatment, we encourage you to take your nausea medication as directed.  BELOW ARE SYMPTOMS THAT SHOULD BE REPORTED IMMEDIATELY: *FEVER GREATER THAN 100.4 F (38 C) OR HIGHER *CHILLS OR SWEATING *NAUSEA AND VOMITING THAT IS NOT CONTROLLED WITH YOUR NAUSEA MEDICATION *UNUSUAL SHORTNESS OF BREATH *UNUSUAL BRUISING OR BLEEDING *URINARY PROBLEMS (pain or burning when urinating, or frequent urination) *BOWEL PROBLEMS (unusual diarrhea, constipation, pain near the anus) TENDERNESS IN MOUTH AND THROAT WITH OR WITHOUT PRESENCE OF ULCERS (sore throat, sores in mouth, or a toothache) UNUSUAL RASH, SWELLING OR PAIN  UNUSUAL VAGINAL DISCHARGE OR ITCHING   Items with * indicate a potential emergency and should be followed up as soon as possible or go to the Emergency Department if any problems should occur.  Please show the CHEMOTHERAPY ALERT CARD or IMMUNOTHERAPY  ALERT CARD at check-in to the Emergency Department and triage nurse.  Should you have questions after your visit or need to cancel or reschedule your appointment, please contact [facility]  Dept: [phone]  and follow the prompts.  Office hours are 8:00 a.m. to 4:30 p.m. Monday - Friday. Please note that voicemails left after 4:00 p.m. may not be returned until the following business day.  We are closed weekends and major holidays. You have access to a nurse at all times for urgent questions. Please call the main number to the clinic Dept: [phone] and follow the prompts.   For any non-urgent questions, you may also contact your provider using MyChart. We now offer e-Visits for anyone 8 and older to request care online for non-urgent symptoms. For details visit [email or web address].   Also download the MyChart app! Go to the app store, search "MyChart", open the app, select [facility], and log in with your MyChart username and password.  Due to Covid, a mask is required upon entering the hospital/clinic. If you do not have a mask, one will be given to you upon arrival. For doctor visits, patients may have 1 support person aged 38 or older with them. For treatment visits, patients cannot have anyone with them due to current Covid guidelines and our immunocompromised population.

## 2021-05-26 DIAGNOSIS — Z1211 Encounter for screening for malignant neoplasm of colon: Secondary | ICD-10-CM

## 2021-06-01 ENCOUNTER — Other Ambulatory Visit: Payer: Self-pay

## 2021-06-01 ENCOUNTER — Inpatient Hospital Stay: Payer: Medicare HMO

## 2021-06-01 DIAGNOSIS — I700 Atherosclerosis of aorta: Secondary | ICD-10-CM | POA: Diagnosis not present

## 2021-06-01 DIAGNOSIS — J439 Emphysema, unspecified: Secondary | ICD-10-CM | POA: Diagnosis not present

## 2021-06-01 DIAGNOSIS — C450 Mesothelioma of pleura: Secondary | ICD-10-CM

## 2021-06-01 DIAGNOSIS — M19042 Primary osteoarthritis, left hand: Secondary | ICD-10-CM | POA: Diagnosis not present

## 2021-06-01 DIAGNOSIS — K3184 Gastroparesis: Secondary | ICD-10-CM | POA: Diagnosis not present

## 2021-06-01 DIAGNOSIS — Z5111 Encounter for antineoplastic chemotherapy: Secondary | ICD-10-CM | POA: Diagnosis not present

## 2021-06-01 DIAGNOSIS — Z95828 Presence of other vascular implants and grafts: Secondary | ICD-10-CM

## 2021-06-01 DIAGNOSIS — N400 Enlarged prostate without lower urinary tract symptoms: Secondary | ICD-10-CM | POA: Diagnosis not present

## 2021-06-01 DIAGNOSIS — K219 Gastro-esophageal reflux disease without esophagitis: Secondary | ICD-10-CM | POA: Diagnosis not present

## 2021-06-01 DIAGNOSIS — M19041 Primary osteoarthritis, right hand: Secondary | ICD-10-CM | POA: Diagnosis not present

## 2021-06-01 DIAGNOSIS — K7689 Other specified diseases of liver: Secondary | ICD-10-CM | POA: Diagnosis not present

## 2021-06-01 LAB — CMP (CANCER CENTER ONLY)
ALT: 22 U/L (ref 0–44)
AST: 21 U/L (ref 15–41)
Albumin: 3.6 g/dL (ref 3.5–5.0)
Alkaline Phosphatase: 86 U/L (ref 38–126)
Anion gap: 7 (ref 5–15)
BUN: 16 mg/dL (ref 8–23)
CO2: 29 mmol/L (ref 22–32)
Calcium: 9.4 mg/dL (ref 8.9–10.3)
Chloride: 102 mmol/L (ref 98–111)
Creatinine: 0.75 mg/dL (ref 0.61–1.24)
GFR, Estimated: >60 mL/min (ref >60)
Glucose, Bld: 95 mg/dL (ref 70–99)
Potassium: 4.4 mmol/L (ref 3.5–5.1)
Sodium: 138 mmol/L (ref 135–145)
Total Bilirubin: 0.6 mg/dL (ref 0.3–1.2)
Total Protein: 6.5 g/dL (ref 6.5–8.1)

## 2021-06-01 LAB — CBC WITH DIFFERENTIAL (CANCER CENTER ONLY)
Abs Immature Granulocytes: 0.01 10*3/uL (ref 0.00–0.07)
Basophils Absolute: 0 10*3/uL (ref 0.0–0.1)
Basophils Relative: 0 %
Eosinophils Absolute: 0 10*3/uL (ref 0.0–0.5)
Eosinophils Relative: 1 %
HCT: 32.9 % — ABNORMAL LOW (ref 39.0–52.0)
Hemoglobin: 11.4 g/dL — ABNORMAL LOW (ref 13.0–17.0)
Immature Granulocytes: 0 %
Lymphocytes Relative: 31 %
Lymphs Abs: 0.9 10*3/uL (ref 0.7–4.0)
MCH: 32.6 pg (ref 26.0–34.0)
MCHC: 34.7 g/dL (ref 30.0–36.0)
MCV: 94 fL (ref 80.0–100.0)
Monocytes Absolute: 0.3 10*3/uL (ref 0.1–1.0)
Monocytes Relative: 11 %
Neutro Abs: 1.7 10*3/uL (ref 1.7–7.7)
Neutrophils Relative %: 57 %
Platelet Count: 136 10*3/uL — ABNORMAL LOW (ref 150–400)
RBC: 3.5 MIL/uL — ABNORMAL LOW (ref 4.22–5.81)
RDW: 17 % — ABNORMAL HIGH (ref 11.5–15.5)
WBC Count: 2.9 10*3/uL — ABNORMAL LOW (ref 4.0–10.5)
nRBC: 0 % (ref 0.0–0.2)

## 2021-06-01 MED ORDER — HEPARIN SOD (PORK) LOCK FLUSH 100 UNIT/ML IV SOLN
500.0000 [IU] | Freq: Once | INTRAVENOUS | Status: AC
  Administered 2021-06-01: 500 [IU]

## 2021-06-01 MED ORDER — SODIUM CHLORIDE 0.9% FLUSH
10.0000 mL | Freq: Once | INTRAVENOUS | Status: AC
  Administered 2021-06-01: 10 mL

## 2021-06-02 DIAGNOSIS — Z1211 Encounter for screening for malignant neoplasm of colon: Secondary | ICD-10-CM | POA: Diagnosis not present

## 2021-06-08 ENCOUNTER — Other Ambulatory Visit: Payer: Medicare HMO

## 2021-06-08 LAB — COLOGUARD: Cologuard: NEGATIVE

## 2021-06-09 ENCOUNTER — Other Ambulatory Visit: Payer: Self-pay

## 2021-06-09 ENCOUNTER — Inpatient Hospital Stay: Payer: Medicare HMO | Attending: Internal Medicine

## 2021-06-09 ENCOUNTER — Encounter: Payer: Self-pay | Admitting: Internal Medicine

## 2021-06-09 DIAGNOSIS — N400 Enlarged prostate without lower urinary tract symptoms: Secondary | ICD-10-CM | POA: Diagnosis not present

## 2021-06-09 DIAGNOSIS — C450 Mesothelioma of pleura: Secondary | ICD-10-CM | POA: Diagnosis not present

## 2021-06-09 DIAGNOSIS — R350 Frequency of micturition: Secondary | ICD-10-CM | POA: Diagnosis not present

## 2021-06-09 DIAGNOSIS — Z7982 Long term (current) use of aspirin: Secondary | ICD-10-CM | POA: Insufficient documentation

## 2021-06-09 DIAGNOSIS — K219 Gastro-esophageal reflux disease without esophagitis: Secondary | ICD-10-CM | POA: Insufficient documentation

## 2021-06-09 DIAGNOSIS — Z8711 Personal history of peptic ulcer disease: Secondary | ICD-10-CM | POA: Diagnosis not present

## 2021-06-09 DIAGNOSIS — Z79899 Other long term (current) drug therapy: Secondary | ICD-10-CM | POA: Diagnosis not present

## 2021-06-09 DIAGNOSIS — M129 Arthropathy, unspecified: Secondary | ICD-10-CM | POA: Diagnosis not present

## 2021-06-09 DIAGNOSIS — Z86010 Personal history of colonic polyps: Secondary | ICD-10-CM | POA: Insufficient documentation

## 2021-06-09 DIAGNOSIS — M2550 Pain in unspecified joint: Secondary | ICD-10-CM | POA: Insufficient documentation

## 2021-06-09 DIAGNOSIS — Z5111 Encounter for antineoplastic chemotherapy: Secondary | ICD-10-CM | POA: Insufficient documentation

## 2021-06-09 DIAGNOSIS — J439 Emphysema, unspecified: Secondary | ICD-10-CM | POA: Diagnosis not present

## 2021-06-09 DIAGNOSIS — Z95828 Presence of other vascular implants and grafts: Secondary | ICD-10-CM

## 2021-06-09 LAB — CBC WITH DIFFERENTIAL (CANCER CENTER ONLY)
Abs Immature Granulocytes: 0 10*3/uL (ref 0.00–0.07)
Basophils Absolute: 0 10*3/uL (ref 0.0–0.1)
Basophils Relative: 0 %
Eosinophils Absolute: 0 10*3/uL (ref 0.0–0.5)
Eosinophils Relative: 1 %
HCT: 31.3 % — ABNORMAL LOW (ref 39.0–52.0)
Hemoglobin: 10.9 g/dL — ABNORMAL LOW (ref 13.0–17.0)
Immature Granulocytes: 0 %
Lymphocytes Relative: 32 %
Lymphs Abs: 0.9 10*3/uL (ref 0.7–4.0)
MCH: 33.1 pg (ref 26.0–34.0)
MCHC: 34.8 g/dL (ref 30.0–36.0)
MCV: 95.1 fL (ref 80.0–100.0)
Monocytes Absolute: 0.4 10*3/uL (ref 0.1–1.0)
Monocytes Relative: 16 %
Neutro Abs: 1.3 10*3/uL — ABNORMAL LOW (ref 1.7–7.7)
Neutrophils Relative %: 51 %
Platelet Count: 99 10*3/uL — ABNORMAL LOW (ref 150–400)
RBC: 3.29 MIL/uL — ABNORMAL LOW (ref 4.22–5.81)
RDW: 17.5 % — ABNORMAL HIGH (ref 11.5–15.5)
WBC Count: 2.6 10*3/uL — ABNORMAL LOW (ref 4.0–10.5)
nRBC: 0 % (ref 0.0–0.2)

## 2021-06-09 LAB — CMP (CANCER CENTER ONLY)
ALT: 19 U/L (ref 0–44)
AST: 19 U/L (ref 15–41)
Albumin: 3.6 g/dL (ref 3.5–5.0)
Alkaline Phosphatase: 88 U/L (ref 38–126)
Anion gap: 10 (ref 5–15)
BUN: 12 mg/dL (ref 8–23)
CO2: 27 mmol/L (ref 22–32)
Calcium: 9.3 mg/dL (ref 8.9–10.3)
Chloride: 103 mmol/L (ref 98–111)
Creatinine: 0.86 mg/dL (ref 0.61–1.24)
GFR, Estimated: >60 mL/min (ref >60)
Glucose, Bld: 167 mg/dL — ABNORMAL HIGH (ref 70–99)
Potassium: 3.8 mmol/L (ref 3.5–5.1)
Sodium: 140 mmol/L (ref 135–145)
Total Bilirubin: 0.6 mg/dL (ref 0.3–1.2)
Total Protein: 6.7 g/dL (ref 6.5–8.1)

## 2021-06-09 MED ORDER — SODIUM CHLORIDE 0.9% FLUSH
10.0000 mL | Freq: Once | INTRAVENOUS | Status: AC
  Administered 2021-06-09: 10 mL

## 2021-06-09 MED ORDER — HEPARIN SOD (PORK) LOCK FLUSH 100 UNIT/ML IV SOLN
500.0000 [IU] | Freq: Once | INTRAVENOUS | Status: AC
  Administered 2021-06-09: 500 [IU]

## 2021-06-12 ENCOUNTER — Other Ambulatory Visit: Payer: Self-pay | Admitting: Internal Medicine

## 2021-06-15 ENCOUNTER — Inpatient Hospital Stay: Payer: Medicare HMO

## 2021-06-15 ENCOUNTER — Telehealth: Payer: Self-pay

## 2021-06-15 ENCOUNTER — Inpatient Hospital Stay: Payer: Medicare HMO | Admitting: Internal Medicine

## 2021-06-15 ENCOUNTER — Other Ambulatory Visit: Payer: Self-pay

## 2021-06-15 VITALS — BP 130/76 | HR 79 | Temp 97.8°F | Resp 20 | Ht 68.0 in | Wt 233.5 lb

## 2021-06-15 VITALS — BP 136/60 | HR 66

## 2021-06-15 DIAGNOSIS — Z86010 Personal history of colonic polyps: Secondary | ICD-10-CM | POA: Diagnosis not present

## 2021-06-15 DIAGNOSIS — C450 Mesothelioma of pleura: Secondary | ICD-10-CM | POA: Diagnosis not present

## 2021-06-15 DIAGNOSIS — K219 Gastro-esophageal reflux disease without esophagitis: Secondary | ICD-10-CM | POA: Diagnosis not present

## 2021-06-15 DIAGNOSIS — N400 Enlarged prostate without lower urinary tract symptoms: Secondary | ICD-10-CM | POA: Diagnosis not present

## 2021-06-15 DIAGNOSIS — Z5111 Encounter for antineoplastic chemotherapy: Secondary | ICD-10-CM

## 2021-06-15 DIAGNOSIS — Z95828 Presence of other vascular implants and grafts: Secondary | ICD-10-CM

## 2021-06-15 DIAGNOSIS — M2550 Pain in unspecified joint: Secondary | ICD-10-CM | POA: Diagnosis not present

## 2021-06-15 DIAGNOSIS — R350 Frequency of micturition: Secondary | ICD-10-CM | POA: Diagnosis not present

## 2021-06-15 DIAGNOSIS — Z8711 Personal history of peptic ulcer disease: Secondary | ICD-10-CM | POA: Diagnosis not present

## 2021-06-15 DIAGNOSIS — J439 Emphysema, unspecified: Secondary | ICD-10-CM | POA: Diagnosis not present

## 2021-06-15 DIAGNOSIS — M129 Arthropathy, unspecified: Secondary | ICD-10-CM | POA: Diagnosis not present

## 2021-06-15 LAB — CMP (CANCER CENTER ONLY)
ALT: 13 U/L (ref 0–44)
AST: 18 U/L (ref 15–41)
Albumin: 3.7 g/dL (ref 3.5–5.0)
Alkaline Phosphatase: 91 U/L (ref 38–126)
Anion gap: 9 (ref 5–15)
BUN: 14 mg/dL (ref 8–23)
CO2: 24 mmol/L (ref 22–32)
Calcium: 9.4 mg/dL (ref 8.9–10.3)
Chloride: 105 mmol/L (ref 98–111)
Creatinine: 0.89 mg/dL (ref 0.61–1.24)
GFR, Estimated: >60 mL/min (ref >60)
Glucose, Bld: 129 mg/dL — ABNORMAL HIGH (ref 70–99)
Potassium: 3.9 mmol/L (ref 3.5–5.1)
Sodium: 138 mmol/L (ref 135–145)
Total Bilirubin: 0.5 mg/dL (ref 0.3–1.2)
Total Protein: 7.1 g/dL (ref 6.5–8.1)

## 2021-06-15 LAB — CBC WITH DIFFERENTIAL (CANCER CENTER ONLY)
Abs Immature Granulocytes: 0.03 10*3/uL (ref 0.00–0.07)
Basophils Absolute: 0 10*3/uL (ref 0.0–0.1)
Basophils Relative: 0 %
Eosinophils Absolute: 0 10*3/uL (ref 0.0–0.5)
Eosinophils Relative: 0 %
HCT: 32.3 % — ABNORMAL LOW (ref 39.0–52.0)
Hemoglobin: 11.3 g/dL — ABNORMAL LOW (ref 13.0–17.0)
Immature Granulocytes: 1 %
Lymphocytes Relative: 13 %
Lymphs Abs: 0.6 10*3/uL — ABNORMAL LOW (ref 0.7–4.0)
MCH: 33.8 pg (ref 26.0–34.0)
MCHC: 35 g/dL (ref 30.0–36.0)
MCV: 96.7 fL (ref 80.0–100.0)
Monocytes Absolute: 0.8 10*3/uL (ref 0.1–1.0)
Monocytes Relative: 16 %
Neutro Abs: 3.6 10*3/uL (ref 1.7–7.7)
Neutrophils Relative %: 70 %
Platelet Count: 193 10*3/uL (ref 150–400)
RBC: 3.34 MIL/uL — ABNORMAL LOW (ref 4.22–5.81)
RDW: 18.1 % — ABNORMAL HIGH (ref 11.5–15.5)
WBC Count: 5.1 10*3/uL (ref 4.0–10.5)
nRBC: 0 % (ref 0.0–0.2)

## 2021-06-15 LAB — TOTAL PROTEIN, URINE DIPSTICK: Protein, ur: NEGATIVE mg/dL

## 2021-06-15 MED ORDER — SODIUM CHLORIDE 0.9% FLUSH
10.0000 mL | Freq: Once | INTRAVENOUS | Status: AC
  Administered 2021-06-15: 10 mL

## 2021-06-15 MED ORDER — SODIUM CHLORIDE 0.9 % IV SOLN
500.0000 mg/m2 | Freq: Once | INTRAVENOUS | Status: AC
  Administered 2021-06-15: 1100 mg via INTRAVENOUS
  Filled 2021-06-15: qty 40

## 2021-06-15 MED ORDER — LIDOCAINE-PRILOCAINE 2.5-2.5 % EX CREA: TOPICAL_CREAM | CUTANEOUS | 0 refills | Status: DC

## 2021-06-15 MED ORDER — SODIUM CHLORIDE 0.9 % IV SOLN
150.0000 mg | Freq: Once | INTRAVENOUS | Status: AC
  Administered 2021-06-15: 150 mg via INTRAVENOUS
  Filled 2021-06-15: qty 150

## 2021-06-15 MED ORDER — SODIUM CHLORIDE 0.9 % IV SOLN
15.0000 mg/kg | Freq: Once | INTRAVENOUS | Status: AC
  Administered 2021-06-15: 1600 mg via INTRAVENOUS
  Filled 2021-06-15: qty 64

## 2021-06-15 MED ORDER — PALONOSETRON HCL INJECTION 0.25 MG/5ML
0.2500 mg | Freq: Once | INTRAVENOUS | Status: AC
  Administered 2021-06-15: 0.25 mg via INTRAVENOUS
  Filled 2021-06-15: qty 5

## 2021-06-15 MED ORDER — SODIUM CHLORIDE 0.9 % IV SOLN: Freq: Once | INTRAVENOUS | Status: AC

## 2021-06-15 MED ORDER — SODIUM CHLORIDE 0.9% FLUSH
10.0000 mL | INTRAVENOUS | Status: DC | PRN
  Administered 2021-06-15: 10 mL

## 2021-06-15 MED ORDER — HEPARIN SOD (PORK) LOCK FLUSH 100 UNIT/ML IV SOLN
500.0000 [IU] | Freq: Once | INTRAVENOUS | Status: AC | PRN
Start: 2021-06-15 — End: 2021-06-15
  Administered 2021-06-15: 500 [IU]

## 2021-06-15 MED ORDER — SODIUM CHLORIDE 0.9 % IV SOLN
624.0000 mg | Freq: Once | INTRAVENOUS | Status: AC
  Administered 2021-06-15: 620 mg via INTRAVENOUS
  Filled 2021-06-15: qty 62

## 2021-06-15 MED ORDER — SODIUM CHLORIDE 0.9 % IV SOLN
10.0000 mg | Freq: Once | INTRAVENOUS | Status: AC
  Administered 2021-06-15: 10 mg via INTRAVENOUS
  Filled 2021-06-15: qty 10

## 2021-06-15 NOTE — Progress Notes (Signed)
[facility] Telephone:(336) [phone]   Fax:(336) [phone]  OFFICE PROGRESS NOTE  Carlena Hurl, PA-C [place] [address]  DIAGNOSIS: Stage IB (T2, N0, M0) unresectable right malignant pleural mesothelioma.,  Epithelioid type diagnosed in June 2022.  PRIOR THERAPY: Status post right VATS with pleural biopsy and talc pleurodesis under the care of Dr. Roxan Hockey on 02/27/2021.  CURRENT THERAPY: Systemic chemotherapy with carboplatin for AUC of 5, Alimta 500 Mg/M2 and Avastin 15 mg/KG every 3 weeks.  First dose March 23, 2021.  Status post 4 cycles  INTERVAL HISTORY: Guy Evans 70 y.o. male returns to the clinic today for follow-up visit.  His wife was available by phone during the visit.  The patient is feeling fine today with no concerning complaints except for intermittent chest tightness and shortness of breath with exertion.  He denied having any cough or hemoptysis.  He denied having any current nausea, vomiting, diarrhea or constipation.  He has no headache or visual changes.  He tolerated the last cycle of his treatment fairly well except for fatigue started few days after the treatment.  He is here today for evaluation before starting cycle #5 of his treatment.     MEDICAL HISTORY: Past Medical History:  Diagnosis Date   Arthritis    hands   Enlarged prostate    takes Terazosin daily   Gastroparesis    GERD (gastroesophageal reflux disease)    occasionally but no meds required   H/O cardiovascular stress test 10/2019   nuclear, normal, Dr. Fransico Him   History of colon polyps    History of gastric ulcer    Joint pain    knees and ankles   Urinary frequency     ALLERGIES:  has No Known Allergies.  MEDICATIONS:  Current Outpatient Medications  Medication Sig Dispense Refill   acetaminophen (TYLENOL) 500 MG tablet Take 2 tablets (1,000 mg total) by mouth every 6 (six) hours as needed. 30 tablet 0   albuterol (VENTOLIN HFA)  108 (90 Base) MCG/ACT inhaler Inhale 2 puffs into the lungs every 6 (six) hours as needed for wheezing or shortness of breath. (Patient not taking: No sig reported) 8 g 0   aspirin 81 MG EC tablet Take 81 mg by mouth daily.     atorvastatin (LIPITOR) 80 MG tablet Take 1 tablet (80 mg total) by mouth daily. 30 tablet 2   dexamethasone (DECADRON) 4 MG tablet 4 mg p.o. twice daily the day before, day of and day after chemotherapy every 3 weeks 40 tablet 1   Fluticasone-Umeclidin-Vilant (TRELEGY ELLIPTA) 100-62.5-25 MCG/INH AEPB Inhale 1 puff into the lungs daily. (Patient not taking: No sig reported) 28 each 5   folic acid (FOLVITE) 1 MG tablet Take 1 tablet (1 mg total) by mouth daily. 30 tablet 4   glucosamine-chondroitin 500-400 MG tablet Take 1 tablet by mouth daily.     L-Lysine 1000 MG TABS Take 1-2 tablets by mouth as needed (Daily as needed).     lidocaine-prilocaine (EMLA) cream Apply to the Port-A-Cath site 30-60-minute before chemotherapy. 30 g 0   magic mouthwash SOLN Take 5 mLs by mouth 3 (three) times daily as needed for mouth pain. 240 mL 0   omeprazole (PRILOSEC) 20 MG capsule Take 20 mg by mouth daily.     oxyCODONE (OXY IR/ROXICODONE) 5 MG immediate release tablet Take 1-2 tablets (5-10 mg total) by mouth every 4 (four) hours as needed for moderate pain. (Patient  not taking: No sig reported) 30 tablet 0   phenylephrine (NEO-SYNEPHRINE) 1 % nasal spray Place 1 drop into both nostrils at bedtime as needed for congestion.     prochlorperazine (COMPAZINE) 10 MG tablet TAKE 1 TABLET BY MOUTH EVERY 6 HOURS AS NEEDED FOR NAUSEA OR VOMITING. 30 tablet 0   terazosin (HYTRIN) 10 MG capsule TAKE 1 CAPSULE BY MOUTH EVERY DAY 90 capsule 0   Turmeric (QC TUMERIC COMPLEX PO) Take 450 mg by mouth daily.     No current facility-administered medications for this visit.    SURGICAL HISTORY:  Past Surgical History:  Procedure Laterality Date   CHOLECYSTECTOMY N/A 11/19/2013   Procedure:  LAPAROSCOPIC CHOLECYSTECTOMY;  Surgeon: Harl Bowie, MD;  Location: [facility];  Service: General;  Laterality: N/A;   COLONOSCOPY     DIAGNOSTIC LAPAROSCOPY     lap chole   ESOPHAGOGASTRODUODENOSCOPY     IR IMAGING GUIDED PORT INSERTION  03/11/2021   skin spots removed and tested     done in the office   THORACENTESIS N/A 01/14/2021   Procedure: THORACENTESIS;  Surgeon: Collene Gobble, MD;  Location: [facility];  Service: Cardiopulmonary;  Laterality: N/A;   THORACENTESIS N/A 01/23/2021   Procedure: THORACENTESIS;  Surgeon: Collene Gobble, MD;  Location: [facility] ENDOSCOPY;  Service: Cardiopulmonary;  Laterality: N/A;   VIDEO ASSISTED THORACOSCOPY (VATS) W/TALC PLEUADESIS Right 02/27/2021   Procedure: VIDEO ASSISTED THORACOSCOPY (VATS) WITH  TALC PLEUADESIS and PLEURAL BIOPSY;  Surgeon: Melrose Nakayama, MD;  Location: [facility] OR;  Service: Thoracic;  Laterality: Right;    REVIEW OF SYSTEMS:  A comprehensive review of systems was negative except for: Constitutional: positive for fatigue Respiratory: positive for dyspnea on exertion and pleurisy/chest pain   PHYSICAL EXAMINATION: General appearance: alert, cooperative, fatigued, and no distress Head: Normocephalic, without obvious abnormality, atraumatic Neck: no adenopathy, no JVD, supple, symmetrical, trachea midline, and thyroid not enlarged, symmetric, no tenderness/mass/nodules Lymph nodes: Cervical, supraclavicular, and axillary nodes normal. Resp: clear to auscultation bilaterally Back: symmetric, no curvature. ROM normal. No CVA tenderness. Cardio: regular rate and rhythm, S1, S2 normal, no murmur, click, rub or gallop GI: soft, non-tender; bowel sounds normal; no masses,  no organomegaly Extremities: extremities normal, atraumatic, no cyanosis or edema  ECOG PERFORMANCE STATUS: 1 - Symptomatic but completely ambulatory  Blood pressure 130/76, pulse 79, temperature 97.8 F (36.6 C), temperature source Oral, resp. rate 20, height  5\' 8"  (1.727 m), weight 233 lb 8 oz (105.9 kg), SpO2 97 %.  LABORATORY DATA: Lab Results  Component Value Date   WBC 5.1 06/15/2021   HGB 11.3 (L) 06/15/2021   HCT 32.3 (L) 06/15/2021   MCV 96.7 06/15/2021   PLT 193 06/15/2021      Chemistry      Component Value Date/Time   NA 140 06/09/2021 1123   NA 141 10/20/2020 1030   K 3.8 06/09/2021 1123   CL 103 06/09/2021 1123   CO2 27 06/09/2021 1123   BUN 12 06/09/2021 1123   BUN 13 10/20/2020 1030   CREATININE 0.86 06/09/2021 1123   CREATININE 1.03 10/18/2016 1027      Component Value Date/Time   CALCIUM 9.3 06/09/2021 1123   ALKPHOS 88 06/09/2021 1123   AST 19 06/09/2021 1123   ALT 19 06/09/2021 1123   BILITOT 0.6 06/09/2021 1123       RADIOGRAPHIC STUDIES: CT Chest W Contrast  Result Date: 05/23/2021 CLINICAL DATA:  Mesothelioma. EXAM: CT CHEST WITH CONTRAST TECHNIQUE: Multidetector CT  imaging of the chest was performed during intravenous contrast administration. CONTRAST:  23mL OMNIPAQUE IOHEXOL 350 MG/ML SOLN COMPARISON:  CT scan 01/14/2021 and PET-CT 02/05/2021 FINDINGS: Cardiovascular: The heart is normal in size. No pericardial effusion. The aorta is normal in caliber. No dissection. Moderate atherosclerotic calcifications. Significant/advanced three-vessel coronary artery calcifications are noted. Mediastinum/Nodes: Small scattered mediastinal and hilar lymph nodes but no mass or overt adenopathy. The esophagus is grossly normal. Lungs/Pleura: Interval evacuation of the right pleural effusion demonstrated on the prior studies. Small amount of residual pleural fluid noted at the right base. New pleural calcifications likely due to talc pleurodesis. Numerous pleural nodules are studding the right pleural surface. Some of these are calcified. These are much more obvious now that the effusion and atelectasis has resolved. Based on the PET-CT I suspect overall improvement but difficult to measure nodules on the prior studies. 12  mm pleural nodule medially at the level of the aortic arch on image number 48/5. 10 mm pleural nodule in the right upper lobe laterally on image 58/5. Elongated plaque-like area pleural thickening in the medial pleura of the right upper lobe measures 22 mm on image 61/5 Irregular pleural and subpleural nodular lesion measures 17 mm on image number 72/5. 12 mm pleural nodule in the right middle lobe on image number 97/5 The lungs demonstrate underlying emphysematous changes with apical bulla. No pleural disease involving the left hemithorax. No left-sided pulmonary nodules. Upper Abdomen: No significant upper abdominal findings. Stable aortic calcifications. Small hepatic cysts are noted. Musculoskeletal: No significant bony findings. IMPRESSION: 1. Interval evacuation of the right pleural effusion with a small amount of residual pleural fluid at the right base. Status post talc pleurodesis. 2. Numerous pleural nodules studding the right pleural surface consistent with known mesothelioma. Difficult to directly compare to the prior studies due to obscuration by pleural fluid and atelectasis. Index lesions measured above for future comparison. 3. No mediastinal or hilar mass or adenopathy. 4. Emphysematous changes and pulmonary scarring. 5. Significant/advanced three-vessel coronary artery calcifications. Aortic Atherosclerosis (ICD10-I70.0) and Emphysema (ICD10-J43.9). Electronically Signed   By: Marijo Sanes M.D.   On: 05/23/2021 09:54    ASSESSMENT AND PLAN: This is a very pleasant 70 years old white male recently diagnosed with unresectable stage IB (T2, N0, M0) malignant pleural mesothelioma, epithelioid type diagnosed and June 2022 and presented with extensive involvement of the right hemithorax. The patient is status post VATS with pleural biopsies and talc pleurodesis in June 2022 under the care of Dr. Roxan Hockey. The patient is currently on systemic chemotherapy with carboplatin for AUC of 5, Alimta 500  Mg/M2 and Avastin 15 Mg/KG every 3 weeks.  First dose March 23, 2021.  Status post 4 cycles. The patient has been tolerating this treatment well with no concerning adverse effect except for few days of fatigue after the treatment. I recommended for him to proceed with cycle #5 today as planned. I will see him back for follow-up visit in 3 weeks for evaluation before starting cycle #6. Starting from cycle #7 he will be on treatment with maintenance Avastin every 3 weeks. He was advised to call immediately if he has any other concerning symptoms in the interval.  The patient voices understanding of current disease status and treatment options and is in agreement with the current care plan.  All questions were answered. The patient knows to call the clinic with any problems, questions or concerns. We can certainly see the patient much sooner if necessary.  Disclaimer: This  note was dictated with voice recognition software. Similar sounding words can inadvertently be transcribed and may not be corrected upon review.

## 2021-06-15 NOTE — Telephone Encounter (Signed)
Notified Patient of Prior Authorization approval for Lidocaine-Prilocaine 2.5% Cream. Medictaion is approved through 09/13/2021

## 2021-06-16 ENCOUNTER — Encounter: Payer: Self-pay | Admitting: Internal Medicine

## 2021-06-16 ENCOUNTER — Telehealth: Payer: Self-pay | Admitting: Internal Medicine

## 2021-06-16 NOTE — Telephone Encounter (Signed)
Scheduled follow-up appointments per 10/10 los. Patient is aware. 

## 2021-06-22 ENCOUNTER — Other Ambulatory Visit: Payer: Self-pay

## 2021-06-22 ENCOUNTER — Inpatient Hospital Stay: Payer: Medicare HMO

## 2021-06-22 DIAGNOSIS — M2550 Pain in unspecified joint: Secondary | ICD-10-CM | POA: Diagnosis not present

## 2021-06-22 DIAGNOSIS — C450 Mesothelioma of pleura: Secondary | ICD-10-CM

## 2021-06-22 DIAGNOSIS — Z86010 Personal history of colonic polyps: Secondary | ICD-10-CM | POA: Diagnosis not present

## 2021-06-22 DIAGNOSIS — K219 Gastro-esophageal reflux disease without esophagitis: Secondary | ICD-10-CM | POA: Diagnosis not present

## 2021-06-22 DIAGNOSIS — Z8711 Personal history of peptic ulcer disease: Secondary | ICD-10-CM | POA: Diagnosis not present

## 2021-06-22 DIAGNOSIS — J439 Emphysema, unspecified: Secondary | ICD-10-CM | POA: Diagnosis not present

## 2021-06-22 DIAGNOSIS — R350 Frequency of micturition: Secondary | ICD-10-CM | POA: Diagnosis not present

## 2021-06-22 DIAGNOSIS — Z95828 Presence of other vascular implants and grafts: Secondary | ICD-10-CM

## 2021-06-22 DIAGNOSIS — M129 Arthropathy, unspecified: Secondary | ICD-10-CM | POA: Diagnosis not present

## 2021-06-22 DIAGNOSIS — Z5111 Encounter for antineoplastic chemotherapy: Secondary | ICD-10-CM | POA: Diagnosis not present

## 2021-06-22 DIAGNOSIS — N400 Enlarged prostate without lower urinary tract symptoms: Secondary | ICD-10-CM | POA: Diagnosis not present

## 2021-06-22 LAB — CMP (CANCER CENTER ONLY)
ALT: 16 U/L (ref 0–44)
AST: 20 U/L (ref 15–41)
Albumin: 3.5 g/dL (ref 3.5–5.0)
Alkaline Phosphatase: 90 U/L (ref 38–126)
Anion gap: 8 (ref 5–15)
BUN: 15 mg/dL (ref 8–23)
CO2: 28 mmol/L (ref 22–32)
Calcium: 9.5 mg/dL (ref 8.9–10.3)
Chloride: 103 mmol/L (ref 98–111)
Creatinine: 0.77 mg/dL (ref 0.61–1.24)
GFR, Estimated: >60 mL/min (ref >60)
Glucose, Bld: 103 mg/dL — ABNORMAL HIGH (ref 70–99)
Potassium: 4.2 mmol/L (ref 3.5–5.1)
Sodium: 139 mmol/L (ref 135–145)
Total Bilirubin: 0.6 mg/dL (ref 0.3–1.2)
Total Protein: 6.5 g/dL (ref 6.5–8.1)

## 2021-06-22 LAB — CBC WITH DIFFERENTIAL (CANCER CENTER ONLY)
Abs Immature Granulocytes: 0.01 10*3/uL (ref 0.00–0.07)
Basophils Absolute: 0 10*3/uL (ref 0.0–0.1)
Basophils Relative: 0 %
Eosinophils Absolute: 0.1 10*3/uL (ref 0.0–0.5)
Eosinophils Relative: 2 %
HCT: 31.6 % — ABNORMAL LOW (ref 39.0–52.0)
Hemoglobin: 11 g/dL — ABNORMAL LOW (ref 13.0–17.0)
Immature Granulocytes: 0 %
Lymphocytes Relative: 35 %
Lymphs Abs: 0.9 10*3/uL (ref 0.7–4.0)
MCH: 33.8 pg (ref 26.0–34.0)
MCHC: 34.8 g/dL (ref 30.0–36.0)
MCV: 97.2 fL (ref 80.0–100.0)
Monocytes Absolute: 0.3 10*3/uL (ref 0.1–1.0)
Monocytes Relative: 12 %
Neutro Abs: 1.4 10*3/uL — ABNORMAL LOW (ref 1.7–7.7)
Neutrophils Relative %: 51 %
Platelet Count: 134 10*3/uL — ABNORMAL LOW (ref 150–400)
RBC: 3.25 MIL/uL — ABNORMAL LOW (ref 4.22–5.81)
RDW: 16.5 % — ABNORMAL HIGH (ref 11.5–15.5)
WBC Count: 2.7 10*3/uL — ABNORMAL LOW (ref 4.0–10.5)
nRBC: 0 % (ref 0.0–0.2)

## 2021-06-22 MED ORDER — SODIUM CHLORIDE 0.9% FLUSH
10.0000 mL | Freq: Once | INTRAVENOUS | Status: AC
  Administered 2021-06-22: 10 mL

## 2021-06-22 MED ORDER — HEPARIN SOD (PORK) LOCK FLUSH 100 UNIT/ML IV SOLN
500.0000 [IU] | Freq: Once | INTRAVENOUS | Status: AC
  Administered 2021-06-22: 500 [IU]

## 2021-06-23 ENCOUNTER — Other Ambulatory Visit: Payer: Self-pay | Admitting: Medical

## 2021-06-29 ENCOUNTER — Inpatient Hospital Stay: Payer: Medicare HMO

## 2021-06-29 ENCOUNTER — Other Ambulatory Visit: Payer: Self-pay

## 2021-06-29 DIAGNOSIS — R350 Frequency of micturition: Secondary | ICD-10-CM | POA: Diagnosis not present

## 2021-06-29 DIAGNOSIS — M129 Arthropathy, unspecified: Secondary | ICD-10-CM | POA: Diagnosis not present

## 2021-06-29 DIAGNOSIS — J439 Emphysema, unspecified: Secondary | ICD-10-CM | POA: Diagnosis not present

## 2021-06-29 DIAGNOSIS — C450 Mesothelioma of pleura: Secondary | ICD-10-CM | POA: Diagnosis not present

## 2021-06-29 DIAGNOSIS — Z95828 Presence of other vascular implants and grafts: Secondary | ICD-10-CM

## 2021-06-29 DIAGNOSIS — N400 Enlarged prostate without lower urinary tract symptoms: Secondary | ICD-10-CM | POA: Diagnosis not present

## 2021-06-29 DIAGNOSIS — K219 Gastro-esophageal reflux disease without esophagitis: Secondary | ICD-10-CM | POA: Diagnosis not present

## 2021-06-29 DIAGNOSIS — Z86010 Personal history of colonic polyps: Secondary | ICD-10-CM | POA: Diagnosis not present

## 2021-06-29 DIAGNOSIS — Z5111 Encounter for antineoplastic chemotherapy: Secondary | ICD-10-CM | POA: Diagnosis not present

## 2021-06-29 DIAGNOSIS — Z8711 Personal history of peptic ulcer disease: Secondary | ICD-10-CM | POA: Diagnosis not present

## 2021-06-29 DIAGNOSIS — M2550 Pain in unspecified joint: Secondary | ICD-10-CM | POA: Diagnosis not present

## 2021-06-29 LAB — CMP (CANCER CENTER ONLY)
ALT: 17 U/L (ref 0–44)
AST: 21 U/L (ref 15–41)
Albumin: 3.8 g/dL (ref 3.5–5.0)
Alkaline Phosphatase: 81 U/L (ref 38–126)
Anion gap: 6 (ref 5–15)
BUN: 15 mg/dL (ref 8–23)
CO2: 30 mmol/L (ref 22–32)
Calcium: 9.1 mg/dL (ref 8.9–10.3)
Chloride: 103 mmol/L (ref 98–111)
Creatinine: 0.83 mg/dL (ref 0.61–1.24)
GFR, Estimated: >60 mL/min (ref >60)
Glucose, Bld: 149 mg/dL — ABNORMAL HIGH (ref 70–99)
Potassium: 4 mmol/L (ref 3.5–5.1)
Sodium: 139 mmol/L (ref 135–145)
Total Bilirubin: 0.7 mg/dL (ref 0.3–1.2)
Total Protein: 6.8 g/dL (ref 6.5–8.1)

## 2021-06-29 LAB — CBC WITH DIFFERENTIAL (CANCER CENTER ONLY)
Abs Immature Granulocytes: 0 10*3/uL (ref 0.00–0.07)
Basophils Absolute: 0 10*3/uL (ref 0.0–0.1)
Basophils Relative: 0 %
Eosinophils Absolute: 0 10*3/uL (ref 0.0–0.5)
Eosinophils Relative: 1 %
HCT: 31.1 % — ABNORMAL LOW (ref 39.0–52.0)
Hemoglobin: 10.8 g/dL — ABNORMAL LOW (ref 13.0–17.0)
Immature Granulocytes: 0 %
Lymphocytes Relative: 27 %
Lymphs Abs: 0.8 10*3/uL (ref 0.7–4.0)
MCH: 34.1 pg — ABNORMAL HIGH (ref 26.0–34.0)
MCHC: 34.7 g/dL (ref 30.0–36.0)
MCV: 98.1 fL (ref 80.0–100.0)
Monocytes Absolute: 0.4 10*3/uL (ref 0.1–1.0)
Monocytes Relative: 12 %
Neutro Abs: 1.9 10*3/uL (ref 1.7–7.7)
Neutrophils Relative %: 60 %
Platelet Count: 94 10*3/uL — ABNORMAL LOW (ref 150–400)
RBC: 3.17 MIL/uL — ABNORMAL LOW (ref 4.22–5.81)
RDW: 17.2 % — ABNORMAL HIGH (ref 11.5–15.5)
WBC Count: 3.1 10*3/uL — ABNORMAL LOW (ref 4.0–10.5)
nRBC: 0 % (ref 0.0–0.2)

## 2021-06-29 MED ORDER — SODIUM CHLORIDE 0.9% FLUSH
10.0000 mL | Freq: Once | INTRAVENOUS | Status: AC
  Administered 2021-06-29: 10 mL

## 2021-06-29 MED ORDER — HEPARIN SOD (PORK) LOCK FLUSH 100 UNIT/ML IV SOLN
500.0000 [IU] | Freq: Once | INTRAVENOUS | Status: AC
  Administered 2021-06-29: 500 [IU]

## 2021-07-03 MED FILL — Dexamethasone Sodium Phosphate Inj 100 MG/10ML: INTRAMUSCULAR | Qty: 1 | Status: AC

## 2021-07-03 MED FILL — Fosaprepitant Dimeglumine For IV Infusion 150 MG (Base Eq): INTRAVENOUS | Qty: 5 | Status: AC

## 2021-07-06 ENCOUNTER — Encounter: Payer: Self-pay | Admitting: Internal Medicine

## 2021-07-06 ENCOUNTER — Inpatient Hospital Stay: Payer: Medicare HMO

## 2021-07-06 ENCOUNTER — Inpatient Hospital Stay (HOSPITAL_BASED_OUTPATIENT_CLINIC_OR_DEPARTMENT_OTHER): Payer: Medicare HMO | Admitting: Internal Medicine

## 2021-07-06 ENCOUNTER — Other Ambulatory Visit: Payer: Self-pay

## 2021-07-06 VITALS — BP 129/55 | HR 68

## 2021-07-06 VITALS — BP 139/77 | HR 80 | Temp 97.3°F | Resp 20 | Ht 68.0 in | Wt 234.5 lb

## 2021-07-06 DIAGNOSIS — Z86010 Personal history of colonic polyps: Secondary | ICD-10-CM | POA: Diagnosis not present

## 2021-07-06 DIAGNOSIS — J439 Emphysema, unspecified: Secondary | ICD-10-CM | POA: Diagnosis not present

## 2021-07-06 DIAGNOSIS — Z77090 Contact with and (suspected) exposure to asbestos: Secondary | ICD-10-CM | POA: Diagnosis not present

## 2021-07-06 DIAGNOSIS — M129 Arthropathy, unspecified: Secondary | ICD-10-CM | POA: Diagnosis not present

## 2021-07-06 DIAGNOSIS — K1230 Oral mucositis (ulcerative), unspecified: Secondary | ICD-10-CM | POA: Diagnosis not present

## 2021-07-06 DIAGNOSIS — M2550 Pain in unspecified joint: Secondary | ICD-10-CM | POA: Diagnosis not present

## 2021-07-06 DIAGNOSIS — C450 Mesothelioma of pleura: Secondary | ICD-10-CM

## 2021-07-06 DIAGNOSIS — Z5111 Encounter for antineoplastic chemotherapy: Secondary | ICD-10-CM | POA: Diagnosis not present

## 2021-07-06 DIAGNOSIS — Z95828 Presence of other vascular implants and grafts: Secondary | ICD-10-CM

## 2021-07-06 DIAGNOSIS — Z8711 Personal history of peptic ulcer disease: Secondary | ICD-10-CM | POA: Diagnosis not present

## 2021-07-06 DIAGNOSIS — K219 Gastro-esophageal reflux disease without esophagitis: Secondary | ICD-10-CM | POA: Diagnosis not present

## 2021-07-06 DIAGNOSIS — N400 Enlarged prostate without lower urinary tract symptoms: Secondary | ICD-10-CM | POA: Diagnosis not present

## 2021-07-06 DIAGNOSIS — R350 Frequency of micturition: Secondary | ICD-10-CM | POA: Diagnosis not present

## 2021-07-06 LAB — CBC WITH DIFFERENTIAL (CANCER CENTER ONLY)
Abs Immature Granulocytes: 0.03 10*3/uL (ref 0.00–0.07)
Basophils Absolute: 0 10*3/uL (ref 0.0–0.1)
Basophils Relative: 0 %
Eosinophils Absolute: 0 10*3/uL (ref 0.0–0.5)
Eosinophils Relative: 0 %
HCT: 31.3 % — ABNORMAL LOW (ref 39.0–52.0)
Hemoglobin: 11.3 g/dL — ABNORMAL LOW (ref 13.0–17.0)
Immature Granulocytes: 1 %
Lymphocytes Relative: 12 %
Lymphs Abs: 0.8 10*3/uL (ref 0.7–4.0)
MCH: 35.5 pg — ABNORMAL HIGH (ref 26.0–34.0)
MCHC: 36.1 g/dL — ABNORMAL HIGH (ref 30.0–36.0)
MCV: 98.4 fL (ref 80.0–100.0)
Monocytes Absolute: 0.9 10*3/uL (ref 0.1–1.0)
Monocytes Relative: 14 %
Neutro Abs: 4.4 10*3/uL (ref 1.7–7.7)
Neutrophils Relative %: 73 %
Platelet Count: 168 10*3/uL (ref 150–400)
RBC: 3.18 MIL/uL — ABNORMAL LOW (ref 4.22–5.81)
RDW: 16.9 % — ABNORMAL HIGH (ref 11.5–15.5)
WBC Count: 6.1 10*3/uL (ref 4.0–10.5)
nRBC: 0 % (ref 0.0–0.2)

## 2021-07-06 LAB — CMP (CANCER CENTER ONLY)
ALT: 13 U/L (ref 0–44)
AST: 16 U/L (ref 15–41)
Albumin: 3.8 g/dL (ref 3.5–5.0)
Alkaline Phosphatase: 95 U/L (ref 38–126)
Anion gap: 7 (ref 5–15)
BUN: 14 mg/dL (ref 8–23)
CO2: 26 mmol/L (ref 22–32)
Calcium: 9.3 mg/dL (ref 8.9–10.3)
Chloride: 104 mmol/L (ref 98–111)
Creatinine: 0.81 mg/dL (ref 0.61–1.24)
GFR, Estimated: >60 mL/min (ref >60)
Glucose, Bld: 113 mg/dL — ABNORMAL HIGH (ref 70–99)
Potassium: 4.3 mmol/L (ref 3.5–5.1)
Sodium: 137 mmol/L (ref 135–145)
Total Bilirubin: 0.5 mg/dL (ref 0.3–1.2)
Total Protein: 7 g/dL (ref 6.5–8.1)

## 2021-07-06 LAB — TOTAL PROTEIN, URINE DIPSTICK: Protein, ur: NEGATIVE mg/dL

## 2021-07-06 MED ORDER — SODIUM CHLORIDE 0.9 % IV SOLN
10.0000 mg | Freq: Once | INTRAVENOUS | Status: AC
  Administered 2021-07-06: 10 mg via INTRAVENOUS
  Filled 2021-07-06: qty 10

## 2021-07-06 MED ORDER — CYANOCOBALAMIN 1000 MCG/ML IJ SOLN
1000.0000 ug | Freq: Once | INTRAMUSCULAR | Status: AC
Start: 2021-07-06 — End: 2021-07-06
  Administered 2021-07-06: 1000 ug via INTRAMUSCULAR
  Filled 2021-07-06: qty 1

## 2021-07-06 MED ORDER — SODIUM CHLORIDE 0.9 % IV SOLN
150.0000 mg | Freq: Once | INTRAVENOUS | Status: AC
  Administered 2021-07-06: 150 mg via INTRAVENOUS
  Filled 2021-07-06: qty 150

## 2021-07-06 MED ORDER — SODIUM CHLORIDE 0.9% FLUSH
10.0000 mL | Freq: Once | INTRAVENOUS | Status: AC
  Administered 2021-07-06: 10 mL

## 2021-07-06 MED ORDER — SODIUM CHLORIDE 0.9 % IV SOLN
624.0000 mg | Freq: Once | INTRAVENOUS | Status: AC
  Administered 2021-07-06: 620 mg via INTRAVENOUS
  Filled 2021-07-06: qty 62

## 2021-07-06 MED ORDER — PALONOSETRON HCL INJECTION 0.25 MG/5ML
0.2500 mg | Freq: Once | INTRAVENOUS | Status: AC
  Administered 2021-07-06: 0.25 mg via INTRAVENOUS
  Filled 2021-07-06: qty 5

## 2021-07-06 MED ORDER — SODIUM CHLORIDE 0.9% FLUSH
10.0000 mL | INTRAVENOUS | Status: DC | PRN
  Administered 2021-07-06: 10 mL

## 2021-07-06 MED ORDER — SODIUM CHLORIDE 0.9 % IV SOLN: Freq: Once | INTRAVENOUS | Status: AC

## 2021-07-06 MED ORDER — SODIUM CHLORIDE 0.9 % IV SOLN
15.0000 mg/kg | Freq: Once | INTRAVENOUS | Status: AC
  Administered 2021-07-06: 1600 mg via INTRAVENOUS
  Filled 2021-07-06: qty 64

## 2021-07-06 MED ORDER — MAGIC MOUTHWASH: 5.0000 mL | Freq: Three times a day (TID) | ORAL | 0 refills | Status: DC | PRN

## 2021-07-06 MED ORDER — SODIUM CHLORIDE 0.9 % IV SOLN
500.0000 mg/m2 | Freq: Once | INTRAVENOUS | Status: AC
  Administered 2021-07-06: 1100 mg via INTRAVENOUS
  Filled 2021-07-06: qty 40

## 2021-07-06 MED ORDER — HEPARIN SOD (PORK) LOCK FLUSH 100 UNIT/ML IV SOLN
500.0000 [IU] | Freq: Once | INTRAVENOUS | Status: AC | PRN
  Administered 2021-07-06: 500 [IU]

## 2021-07-06 NOTE — Progress Notes (Signed)
[facility] Telephone:(336) [phone]   Fax:(336) [phone]  OFFICE PROGRESS NOTE  Carlena Hurl, PA-C [place] [address]  DIAGNOSIS: Stage IB (T2, N0, M0) unresectable right malignant pleural mesothelioma.,  Epithelioid type diagnosed in June 2022.  PRIOR THERAPY: Status post right VATS with pleural biopsy and talc pleurodesis under the care of Dr. Roxan Hockey on 02/27/2021.  CURRENT THERAPY: Systemic chemotherapy with carboplatin for AUC of 5, Alimta 500 Mg/M2 and Avastin 15 mg/KG every 3 weeks.  First dose March 23, 2021.  Status post 5 cycles  INTERVAL HISTORY: Guy Evans 70 y.o. male returns to the clinic today for follow-up visit.  His wife Pamala Hurry was available by phone during the visit.  The patient is feeling fine today with no concerning complaints except for mild fatigue as well as cough productive of whitish sputum and postnasal drainage.  He also has some mouth sores and requesting refill of Magic mouthwash.  He denied having any chest pain, shortness of breath or hemoptysis.  He denied having any fever or chills.  He has no nausea, vomiting, diarrhea or constipation.  He has no headache or visual changes.  He is here today for evaluation before starting cycle #6 of his treatment.   MEDICAL HISTORY: Past Medical History:  Diagnosis Date   Arthritis    hands   Enlarged prostate    takes Terazosin daily   Gastroparesis    GERD (gastroesophageal reflux disease)    occasionally but no meds required   H/O cardiovascular stress test 10/2019   nuclear, normal, Dr. Fransico Him   History of colon polyps    History of gastric ulcer    Joint pain    knees and ankles   Urinary frequency     ALLERGIES:  has No Known Allergies.  MEDICATIONS:  Current Outpatient Medications  Medication Sig Dispense Refill   acetaminophen (TYLENOL) 500 MG tablet Take 2 tablets (1,000 mg total) by mouth every 6 (six) hours as needed. 30 tablet 0    albuterol (VENTOLIN HFA) 108 (90 Base) MCG/ACT inhaler Inhale 2 puffs into the lungs every 6 (six) hours as needed for wheezing or shortness of breath. (Patient not taking: No sig reported) 8 g 0   aspirin 81 MG EC tablet Take 81 mg by mouth daily.     atorvastatin (LIPITOR) 80 MG tablet Take 1 tablet (80 mg total) by mouth daily. 30 tablet 2   dexamethasone (DECADRON) 4 MG tablet 4 mg p.o. twice daily the day before, day of and day after chemotherapy every 3 weeks 40 tablet 1   Fluticasone-Umeclidin-Vilant (TRELEGY ELLIPTA) 100-62.5-25 MCG/INH AEPB Inhale 1 puff into the lungs daily. (Patient not taking: No sig reported) 28 each 5   folic acid (FOLVITE) 1 MG tablet Take 1 tablet (1 mg total) by mouth daily. 30 tablet 4   glucosamine-chondroitin 500-400 MG tablet Take 1 tablet by mouth daily.     L-Lysine 1000 MG TABS Take 1-2 tablets by mouth as needed (Daily as needed).     lidocaine-prilocaine (EMLA) cream Apply to the Port-A-Cath site 30-60-minute before chemotherapy. 30 g 0   magic mouthwash SOLN Take 5 mLs by mouth 3 (three) times daily as needed for mouth pain. 240 mL 0   omeprazole (PRILOSEC) 20 MG capsule Take 20 mg by mouth daily.     oxyCODONE (OXY IR/ROXICODONE) 5 MG immediate release tablet Take 1-2 tablets (5-10 mg total) by mouth every 4 (four) hours  as needed for moderate pain. (Patient not taking: No sig reported) 30 tablet 0   phenylephrine (NEO-SYNEPHRINE) 1 % nasal spray Place 1 drop into both nostrils at bedtime as needed for congestion.     prochlorperazine (COMPAZINE) 10 MG tablet TAKE 1 TABLET BY MOUTH EVERY 6 HOURS AS NEEDED FOR NAUSEA OR VOMITING. 30 tablet 0   terazosin (HYTRIN) 10 MG capsule TAKE 1 CAPSULE BY MOUTH EVERY DAY 90 capsule 0   Turmeric (QC TUMERIC COMPLEX PO) Take 450 mg by mouth daily.     No current facility-administered medications for this visit.    SURGICAL HISTORY:  Past Surgical History:  Procedure Laterality Date   CHOLECYSTECTOMY N/A 11/19/2013    Procedure: LAPAROSCOPIC CHOLECYSTECTOMY;  Surgeon: Harl Bowie, MD;  Location: [facility];  Service: General;  Laterality: N/A;   COLONOSCOPY     DIAGNOSTIC LAPAROSCOPY     lap chole   ESOPHAGOGASTRODUODENOSCOPY     IR IMAGING GUIDED PORT INSERTION  03/11/2021   skin spots removed and tested     done in the office   THORACENTESIS N/A 01/14/2021   Procedure: THORACENTESIS;  Surgeon: Collene Gobble, MD;  Location: [facility];  Service: Cardiopulmonary;  Laterality: N/A;   THORACENTESIS N/A 01/23/2021   Procedure: THORACENTESIS;  Surgeon: Collene Gobble, MD;  Location: [facility] ENDOSCOPY;  Service: Cardiopulmonary;  Laterality: N/A;   VIDEO ASSISTED THORACOSCOPY (VATS) W/TALC PLEUADESIS Right 02/27/2021   Procedure: VIDEO ASSISTED THORACOSCOPY (VATS) WITH  TALC PLEUADESIS and PLEURAL BIOPSY;  Surgeon: Melrose Nakayama, MD;  Location: [facility] OR;  Service: Thoracic;  Laterality: Right;    REVIEW OF SYSTEMS:  A comprehensive review of systems was negative except for: Constitutional: positive for fatigue Respiratory: positive for cough   PHYSICAL EXAMINATION: General appearance: alert, cooperative, fatigued, and no distress Head: Normocephalic, without obvious abnormality, atraumatic Neck: no adenopathy, no JVD, supple, symmetrical, trachea midline, and thyroid not enlarged, symmetric, no tenderness/mass/nodules Lymph nodes: Cervical, supraclavicular, and axillary nodes normal. Resp: clear to auscultation bilaterally Back: symmetric, no curvature. ROM normal. No CVA tenderness. Cardio: regular rate and rhythm, S1, S2 normal, no murmur, click, rub or gallop GI: soft, non-tender; bowel sounds normal; no masses,  no organomegaly Extremities: extremities normal, atraumatic, no cyanosis or edema  ECOG PERFORMANCE STATUS: 1 - Symptomatic but completely ambulatory  Blood pressure 139/77, pulse 80, temperature (!) 97.3 F (36.3 C), temperature source Tympanic, resp. rate 20, height 5\' 8"  (1.727 m),  weight 234 lb 8 oz (106.4 kg), SpO2 96 %.  LABORATORY DATA: Lab Results  Component Value Date   WBC 6.1 07/06/2021   HGB 11.3 (L) 07/06/2021   HCT 31.3 (L) 07/06/2021   MCV 98.4 07/06/2021   PLT 168 07/06/2021      Chemistry      Component Value Date/Time   NA 139 06/29/2021 1129   NA 141 10/20/2020 1030   K 4.0 06/29/2021 1129   CL 103 06/29/2021 1129   CO2 30 06/29/2021 1129   BUN 15 06/29/2021 1129   BUN 13 10/20/2020 1030   CREATININE 0.83 06/29/2021 1129   CREATININE 1.03 10/18/2016 1027      Component Value Date/Time   CALCIUM 9.1 06/29/2021 1129   ALKPHOS 81 06/29/2021 1129   AST 21 06/29/2021 1129   ALT 17 06/29/2021 1129   BILITOT 0.7 06/29/2021 1129       RADIOGRAPHIC STUDIES: No results found.  ASSESSMENT AND PLAN: This is a very pleasant 70 years old white male recently  diagnosed with unresectable stage IB (T2, N0, M0) malignant pleural mesothelioma, epithelioid type diagnosed and June 2022 and presented with extensive involvement of the right hemithorax. The patient is status post VATS with pleural biopsies and talc pleurodesis in June 2022 under the care of Dr. Roxan Hockey. The patient is currently on systemic chemotherapy with carboplatin for AUC of 5, Alimta 500 Mg/M2 and Avastin 15 Mg/KG every 3 weeks.  First dose March 23, 2021.  Status post 5 cycles. The patient has been tolerating this treatment well with no concerning adverse effect except for occasional mouth sores. I recommended for him to proceed with cycle #6 today as planned. I will see him back for follow-up visit in 3 weeks for evaluation with repeat CT scan of the chest for restaging of his disease. Starting from cycle #7 he would be on maintenance treatment with single agent Avastin. For the mouth sores, I will give him refill of Magic mouthwash The patient was advised to call immediately if he has any concerning symptoms in the interval. The patient voices understanding of current disease  status and treatment options and is in agreement with the current care plan.  All questions were answered. The patient knows to call the clinic with any problems, questions or concerns. We can certainly see the patient much sooner if necessary.  Disclaimer: This note was dictated with voice recognition software. Similar sounding words can inadvertently be transcribed and may not be corrected upon review.

## 2021-07-06 NOTE — Patient Instructions (Signed)
[facility]  Discharge Instructions: Thank you for choosing [facility] to provide your oncology and hematology care.   If you have a lab appointment with the [facility], please go directly to the [facility] and check in at the registration area.   Wear comfortable clothing and clothing appropriate for easy access to any Portacath or PICC line.   We strive to give you quality time with your provider. You may need to reschedule your appointment if you arrive late (15 or more minutes).  Arriving late affects you and other patients whose appointments are after yours.  Also, if you miss three or more appointments without notifying the office, you may be dismissed from the clinic at the provider's discretion.      For prescription refill requests, have your pharmacy contact our office and allow 72 hours for refills to be completed.    Today you received the following chemotherapy and/or immunotherapy agents: Bevacizumab, Pemetrexed (Alimta), and Carboplatin.   To help prevent nausea and vomiting after your treatment, we encourage you to take your nausea medication as directed.  BELOW ARE SYMPTOMS THAT SHOULD BE REPORTED IMMEDIATELY: *FEVER GREATER THAN 100.4 F (38 C) OR HIGHER *CHILLS OR SWEATING *NAUSEA AND VOMITING THAT IS NOT CONTROLLED WITH YOUR NAUSEA MEDICATION *UNUSUAL SHORTNESS OF BREATH *UNUSUAL BRUISING OR BLEEDING *URINARY PROBLEMS (pain or burning when urinating, or frequent urination) *BOWEL PROBLEMS (unusual diarrhea, constipation, pain near the anus) TENDERNESS IN MOUTH AND THROAT WITH OR WITHOUT PRESENCE OF ULCERS (sore throat, sores in mouth, or a toothache) UNUSUAL RASH, SWELLING OR PAIN  UNUSUAL VAGINAL DISCHARGE OR ITCHING   Items with * indicate a potential emergency and should be followed up as soon as possible or go to the Emergency Department if any problems should occur.  Please show the CHEMOTHERAPY ALERT CARD or  IMMUNOTHERAPY ALERT CARD at check-in to the Emergency Department and triage nurse.  Should you have questions after your visit or need to cancel or reschedule your appointment, please contact [facility]  Dept: [phone]  and follow the prompts.  Office hours are 8:00 a.m. to 4:30 p.m. Monday - Friday. Please note that voicemails left after 4:00 p.m. may not be returned until the following business day.  We are closed weekends and major holidays. You have access to a nurse at all times for urgent questions. Please call the main number to the clinic Dept: [phone] and follow the prompts.   For any non-urgent questions, you may also contact your provider using MyChart. We now offer e-Visits for anyone 43 and older to request care online for non-urgent symptoms. For details visit [email or web address].   Also download the MyChart app! Go to the app store, search "MyChart", open the app, select [facility], and log in with your MyChart username and password.  Due to Covid, a mask is required upon entering the hospital/clinic. If you do not have a mask, one will be given to you upon arrival. For doctor visits, patients may have 1 support person aged 34 or older with them. For treatment visits, patients cannot have anyone with them due to current Covid guidelines and our immunocompromised population.

## 2021-07-08 ENCOUNTER — Telehealth: Payer: Self-pay | Admitting: Internal Medicine

## 2021-07-08 NOTE — Telephone Encounter (Signed)
Scheduled follow-up appointments per 10/31 los. Patient is aware.

## 2021-07-10 ENCOUNTER — Other Ambulatory Visit: Payer: Self-pay | Admitting: Medical Oncology

## 2021-07-10 DIAGNOSIS — C450 Mesothelioma of pleura: Secondary | ICD-10-CM

## 2021-07-13 ENCOUNTER — Other Ambulatory Visit: Payer: Self-pay

## 2021-07-13 ENCOUNTER — Inpatient Hospital Stay: Payer: Medicare HMO | Attending: Internal Medicine

## 2021-07-13 DIAGNOSIS — K7689 Other specified diseases of liver: Secondary | ICD-10-CM | POA: Diagnosis not present

## 2021-07-13 DIAGNOSIS — Z7982 Long term (current) use of aspirin: Secondary | ICD-10-CM | POA: Diagnosis not present

## 2021-07-13 DIAGNOSIS — Z79899 Other long term (current) drug therapy: Secondary | ICD-10-CM | POA: Insufficient documentation

## 2021-07-13 DIAGNOSIS — Z95828 Presence of other vascular implants and grafts: Secondary | ICD-10-CM

## 2021-07-13 DIAGNOSIS — K219 Gastro-esophageal reflux disease without esophagitis: Secondary | ICD-10-CM | POA: Diagnosis not present

## 2021-07-13 DIAGNOSIS — Z8719 Personal history of other diseases of the digestive system: Secondary | ICD-10-CM | POA: Diagnosis not present

## 2021-07-13 DIAGNOSIS — Z5111 Encounter for antineoplastic chemotherapy: Secondary | ICD-10-CM | POA: Diagnosis present

## 2021-07-13 DIAGNOSIS — J432 Centrilobular emphysema: Secondary | ICD-10-CM | POA: Insufficient documentation

## 2021-07-13 DIAGNOSIS — Z5112 Encounter for antineoplastic immunotherapy: Secondary | ICD-10-CM | POA: Diagnosis not present

## 2021-07-13 DIAGNOSIS — Z8711 Personal history of peptic ulcer disease: Secondary | ICD-10-CM | POA: Diagnosis not present

## 2021-07-13 DIAGNOSIS — N400 Enlarged prostate without lower urinary tract symptoms: Secondary | ICD-10-CM | POA: Diagnosis not present

## 2021-07-13 DIAGNOSIS — I2510 Atherosclerotic heart disease of native coronary artery without angina pectoris: Secondary | ICD-10-CM | POA: Diagnosis not present

## 2021-07-13 DIAGNOSIS — K3184 Gastroparesis: Secondary | ICD-10-CM | POA: Insufficient documentation

## 2021-07-13 DIAGNOSIS — I700 Atherosclerosis of aorta: Secondary | ICD-10-CM | POA: Diagnosis not present

## 2021-07-13 DIAGNOSIS — C450 Mesothelioma of pleura: Secondary | ICD-10-CM | POA: Insufficient documentation

## 2021-07-13 LAB — CBC WITH DIFFERENTIAL (CANCER CENTER ONLY)
Abs Immature Granulocytes: 0.01 10*3/uL (ref 0.00–0.07)
Basophils Absolute: 0 10*3/uL (ref 0.0–0.1)
Basophils Relative: 0 %
Eosinophils Absolute: 0.1 10*3/uL (ref 0.0–0.5)
Eosinophils Relative: 2 %
HCT: 31.2 % — ABNORMAL LOW (ref 39.0–52.0)
Hemoglobin: 10.9 g/dL — ABNORMAL LOW (ref 13.0–17.0)
Immature Granulocytes: 0 %
Lymphocytes Relative: 26 %
Lymphs Abs: 1 10*3/uL (ref 0.7–4.0)
MCH: 34.7 pg — ABNORMAL HIGH (ref 26.0–34.0)
MCHC: 34.9 g/dL (ref 30.0–36.0)
MCV: 99.4 fL (ref 80.0–100.0)
Monocytes Absolute: 0.5 10*3/uL (ref 0.1–1.0)
Monocytes Relative: 14 %
Neutro Abs: 2.1 10*3/uL (ref 1.7–7.7)
Neutrophils Relative %: 58 %
Platelet Count: 121 10*3/uL — ABNORMAL LOW (ref 150–400)
RBC: 3.14 MIL/uL — ABNORMAL LOW (ref 4.22–5.81)
RDW: 15.4 % (ref 11.5–15.5)
WBC Count: 3.6 10*3/uL — ABNORMAL LOW (ref 4.0–10.5)
nRBC: 0 % (ref 0.0–0.2)

## 2021-07-13 LAB — CMP (CANCER CENTER ONLY)
ALT: 18 U/L (ref 0–44)
AST: 20 U/L (ref 15–41)
Albumin: 3.6 g/dL (ref 3.5–5.0)
Alkaline Phosphatase: 94 U/L (ref 38–126)
Anion gap: 7 (ref 5–15)
BUN: 13 mg/dL (ref 8–23)
CO2: 28 mmol/L (ref 22–32)
Calcium: 8.7 mg/dL — ABNORMAL LOW (ref 8.9–10.3)
Chloride: 104 mmol/L (ref 98–111)
Creatinine: 0.71 mg/dL (ref 0.61–1.24)
GFR, Estimated: >60 mL/min (ref >60)
Glucose, Bld: 91 mg/dL (ref 70–99)
Potassium: 4.3 mmol/L (ref 3.5–5.1)
Sodium: 139 mmol/L (ref 135–145)
Total Bilirubin: 0.5 mg/dL (ref 0.3–1.2)
Total Protein: 6.7 g/dL (ref 6.5–8.1)

## 2021-07-13 MED ORDER — HEPARIN SOD (PORK) LOCK FLUSH 100 UNIT/ML IV SOLN
500.0000 [IU] | Freq: Once | INTRAVENOUS | Status: AC
  Administered 2021-07-13: 500 [IU]

## 2021-07-13 MED ORDER — SODIUM CHLORIDE 0.9% FLUSH
10.0000 mL | Freq: Once | INTRAVENOUS | Status: AC
  Administered 2021-07-13: 10 mL

## 2021-07-17 ENCOUNTER — Other Ambulatory Visit: Payer: Self-pay

## 2021-07-17 DIAGNOSIS — C450 Mesothelioma of pleura: Secondary | ICD-10-CM

## 2021-07-18 ENCOUNTER — Other Ambulatory Visit: Payer: Self-pay | Admitting: Medical

## 2021-07-20 ENCOUNTER — Inpatient Hospital Stay: Payer: Medicare HMO

## 2021-07-20 ENCOUNTER — Other Ambulatory Visit: Payer: Self-pay

## 2021-07-20 DIAGNOSIS — Z95828 Presence of other vascular implants and grafts: Secondary | ICD-10-CM

## 2021-07-20 DIAGNOSIS — K3184 Gastroparesis: Secondary | ICD-10-CM | POA: Diagnosis not present

## 2021-07-20 DIAGNOSIS — J432 Centrilobular emphysema: Secondary | ICD-10-CM | POA: Diagnosis not present

## 2021-07-20 DIAGNOSIS — Z7982 Long term (current) use of aspirin: Secondary | ICD-10-CM | POA: Diagnosis not present

## 2021-07-20 DIAGNOSIS — I700 Atherosclerosis of aorta: Secondary | ICD-10-CM | POA: Diagnosis not present

## 2021-07-20 DIAGNOSIS — Z79899 Other long term (current) drug therapy: Secondary | ICD-10-CM | POA: Diagnosis not present

## 2021-07-20 DIAGNOSIS — Z5112 Encounter for antineoplastic immunotherapy: Secondary | ICD-10-CM | POA: Diagnosis not present

## 2021-07-20 DIAGNOSIS — C450 Mesothelioma of pleura: Secondary | ICD-10-CM | POA: Diagnosis not present

## 2021-07-20 DIAGNOSIS — K7689 Other specified diseases of liver: Secondary | ICD-10-CM | POA: Diagnosis not present

## 2021-07-20 DIAGNOSIS — N400 Enlarged prostate without lower urinary tract symptoms: Secondary | ICD-10-CM | POA: Diagnosis not present

## 2021-07-20 DIAGNOSIS — Z8711 Personal history of peptic ulcer disease: Secondary | ICD-10-CM | POA: Diagnosis not present

## 2021-07-20 DIAGNOSIS — Z8719 Personal history of other diseases of the digestive system: Secondary | ICD-10-CM | POA: Diagnosis not present

## 2021-07-20 DIAGNOSIS — K219 Gastro-esophageal reflux disease without esophagitis: Secondary | ICD-10-CM | POA: Diagnosis not present

## 2021-07-20 DIAGNOSIS — I2510 Atherosclerotic heart disease of native coronary artery without angina pectoris: Secondary | ICD-10-CM | POA: Diagnosis not present

## 2021-07-20 DIAGNOSIS — Z5111 Encounter for antineoplastic chemotherapy: Secondary | ICD-10-CM | POA: Diagnosis not present

## 2021-07-20 LAB — CMP (CANCER CENTER ONLY)
ALT: 15 U/L (ref 0–44)
AST: 21 U/L (ref 15–41)
Albumin: 3.6 g/dL (ref 3.5–5.0)
Alkaline Phosphatase: 86 U/L (ref 38–126)
Anion gap: 8 (ref 5–15)
BUN: 10 mg/dL (ref 8–23)
CO2: 27 mmol/L (ref 22–32)
Calcium: 8.8 mg/dL — ABNORMAL LOW (ref 8.9–10.3)
Chloride: 104 mmol/L (ref 98–111)
Creatinine: 0.77 mg/dL (ref 0.61–1.24)
GFR, Estimated: >60 mL/min (ref >60)
Glucose, Bld: 86 mg/dL (ref 70–99)
Potassium: 3.9 mmol/L (ref 3.5–5.1)
Sodium: 139 mmol/L (ref 135–145)
Total Bilirubin: 0.6 mg/dL (ref 0.3–1.2)
Total Protein: 6.7 g/dL (ref 6.5–8.1)

## 2021-07-20 LAB — CBC WITH DIFFERENTIAL (CANCER CENTER ONLY)
Abs Immature Granulocytes: 0.01 10*3/uL (ref 0.00–0.07)
Basophils Absolute: 0 10*3/uL (ref 0.0–0.1)
Basophils Relative: 0 %
Eosinophils Absolute: 0 10*3/uL (ref 0.0–0.5)
Eosinophils Relative: 1 %
HCT: 30.4 % — ABNORMAL LOW (ref 39.0–52.0)
Hemoglobin: 10.5 g/dL — ABNORMAL LOW (ref 13.0–17.0)
Immature Granulocytes: 0 %
Lymphocytes Relative: 22 %
Lymphs Abs: 0.9 10*3/uL (ref 0.7–4.0)
MCH: 35.1 pg — ABNORMAL HIGH (ref 26.0–34.0)
MCHC: 34.5 g/dL (ref 30.0–36.0)
MCV: 101.7 fL — ABNORMAL HIGH (ref 80.0–100.0)
Monocytes Absolute: 0.7 10*3/uL (ref 0.1–1.0)
Monocytes Relative: 15 %
Neutro Abs: 2.7 10*3/uL (ref 1.7–7.7)
Neutrophils Relative %: 62 %
Platelet Count: 75 10*3/uL — ABNORMAL LOW (ref 150–400)
RBC: 2.99 MIL/uL — ABNORMAL LOW (ref 4.22–5.81)
RDW: 15.9 % — ABNORMAL HIGH (ref 11.5–15.5)
WBC Count: 4.3 10*3/uL (ref 4.0–10.5)
nRBC: 0 % (ref 0.0–0.2)

## 2021-07-20 MED ORDER — HEPARIN SOD (PORK) LOCK FLUSH 100 UNIT/ML IV SOLN
500.0000 [IU] | Freq: Once | INTRAVENOUS | Status: AC
  Administered 2021-07-20: 500 [IU]

## 2021-07-20 MED ORDER — SODIUM CHLORIDE 0.9% FLUSH
10.0000 mL | Freq: Once | INTRAVENOUS | Status: AC
  Administered 2021-07-20: 10 mL

## 2021-07-23 ENCOUNTER — Other Ambulatory Visit: Payer: Self-pay

## 2021-07-23 ENCOUNTER — Other Ambulatory Visit: Payer: Self-pay | Admitting: Internal Medicine

## 2021-07-23 ENCOUNTER — Ambulatory Visit (HOSPITAL_COMMUNITY)
Admission: RE | Admit: 2021-07-23 | Discharge: 2021-07-23 | Disposition: A | Discharge status: Home / Self Care | Payer: Medicare HMO | Source: Ambulatory Visit | Attending: Internal Medicine | Admitting: Internal Medicine

## 2021-07-23 ENCOUNTER — Ambulatory Visit: Admission: RE | Admit: 2021-07-23 | Payer: Self-pay | Source: Ambulatory Visit

## 2021-07-23 DIAGNOSIS — Z79899 Other long term (current) drug therapy: Secondary | ICD-10-CM | POA: Insufficient documentation

## 2021-07-23 DIAGNOSIS — Z77090 Contact with and (suspected) exposure to asbestos: Secondary | ICD-10-CM | POA: Diagnosis present

## 2021-07-23 DIAGNOSIS — C459 Mesothelioma, unspecified: Secondary | ICD-10-CM | POA: Insufficient documentation

## 2021-07-23 DIAGNOSIS — J439 Emphysema, unspecified: Secondary | ICD-10-CM | POA: Insufficient documentation

## 2021-07-23 DIAGNOSIS — I2510 Atherosclerotic heart disease of native coronary artery without angina pectoris: Secondary | ICD-10-CM | POA: Diagnosis not present

## 2021-07-23 DIAGNOSIS — I700 Atherosclerosis of aorta: Secondary | ICD-10-CM | POA: Diagnosis not present

## 2021-07-23 DIAGNOSIS — R911 Solitary pulmonary nodule: Secondary | ICD-10-CM | POA: Diagnosis not present

## 2021-07-23 MED ORDER — HEPARIN SOD (PORK) LOCK FLUSH 100 UNIT/ML IV SOLN
INTRAVENOUS | Status: AC
  Filled 2021-07-23: qty 5

## 2021-07-23 MED ORDER — IOHEXOL 350 MG/ML SOLN
75.0000 mL | Freq: Once | INTRAVENOUS | Status: AC | PRN
  Administered 2021-07-23: 18:00:00 60 mL via INTRAVENOUS

## 2021-07-23 MED ORDER — HEPARIN SOD (PORK) LOCK FLUSH 100 UNIT/ML IV SOLN
500.0000 [IU] | Freq: Once | INTRAVENOUS | Status: AC
  Administered 2021-07-23: 18:00:00 500 [IU] via INTRAVENOUS

## 2021-07-26 ENCOUNTER — Other Ambulatory Visit: Payer: Self-pay | Admitting: Internal Medicine

## 2021-07-27 ENCOUNTER — Inpatient Hospital Stay: Payer: Medicare HMO

## 2021-07-27 ENCOUNTER — Encounter: Payer: Self-pay | Admitting: Internal Medicine

## 2021-07-27 ENCOUNTER — Other Ambulatory Visit: Payer: Self-pay

## 2021-07-27 ENCOUNTER — Inpatient Hospital Stay (HOSPITAL_BASED_OUTPATIENT_CLINIC_OR_DEPARTMENT_OTHER): Payer: Medicare HMO | Admitting: Internal Medicine

## 2021-07-27 ENCOUNTER — Other Ambulatory Visit: Payer: Self-pay | Admitting: Medical Oncology

## 2021-07-27 VITALS — BP 144/76 | HR 76 | Temp 97.5°F | Resp 18 | Wt 231.9 lb

## 2021-07-27 VITALS — BP 147/72 | HR 72

## 2021-07-27 DIAGNOSIS — C450 Mesothelioma of pleura: Secondary | ICD-10-CM

## 2021-07-27 DIAGNOSIS — I700 Atherosclerosis of aorta: Secondary | ICD-10-CM | POA: Diagnosis not present

## 2021-07-27 DIAGNOSIS — Z95828 Presence of other vascular implants and grafts: Secondary | ICD-10-CM

## 2021-07-27 DIAGNOSIS — Z5111 Encounter for antineoplastic chemotherapy: Secondary | ICD-10-CM

## 2021-07-27 DIAGNOSIS — K3184 Gastroparesis: Secondary | ICD-10-CM | POA: Diagnosis not present

## 2021-07-27 DIAGNOSIS — Z5112 Encounter for antineoplastic immunotherapy: Secondary | ICD-10-CM | POA: Diagnosis not present

## 2021-07-27 DIAGNOSIS — J432 Centrilobular emphysema: Secondary | ICD-10-CM | POA: Diagnosis not present

## 2021-07-27 DIAGNOSIS — Z79899 Other long term (current) drug therapy: Secondary | ICD-10-CM | POA: Diagnosis not present

## 2021-07-27 DIAGNOSIS — N400 Enlarged prostate without lower urinary tract symptoms: Secondary | ICD-10-CM | POA: Diagnosis not present

## 2021-07-27 DIAGNOSIS — I2510 Atherosclerotic heart disease of native coronary artery without angina pectoris: Secondary | ICD-10-CM | POA: Diagnosis not present

## 2021-07-27 DIAGNOSIS — Z7982 Long term (current) use of aspirin: Secondary | ICD-10-CM | POA: Diagnosis not present

## 2021-07-27 DIAGNOSIS — K7689 Other specified diseases of liver: Secondary | ICD-10-CM | POA: Diagnosis not present

## 2021-07-27 DIAGNOSIS — Z8711 Personal history of peptic ulcer disease: Secondary | ICD-10-CM | POA: Diagnosis not present

## 2021-07-27 DIAGNOSIS — Z8719 Personal history of other diseases of the digestive system: Secondary | ICD-10-CM | POA: Diagnosis not present

## 2021-07-27 DIAGNOSIS — K219 Gastro-esophageal reflux disease without esophagitis: Secondary | ICD-10-CM | POA: Diagnosis not present

## 2021-07-27 LAB — CBC WITH DIFFERENTIAL (CANCER CENTER ONLY)
Abs Immature Granulocytes: 0.02 10*3/uL (ref 0.00–0.07)
Basophils Absolute: 0 10*3/uL (ref 0.0–0.1)
Basophils Relative: 0 %
Eosinophils Absolute: 0 10*3/uL (ref 0.0–0.5)
Eosinophils Relative: 0 %
HCT: 30.2 % — ABNORMAL LOW (ref 39.0–52.0)
Hemoglobin: 10.8 g/dL — ABNORMAL LOW (ref 13.0–17.0)
Immature Granulocytes: 1 %
Lymphocytes Relative: 10 %
Lymphs Abs: 0.4 10*3/uL — ABNORMAL LOW (ref 0.7–4.0)
MCH: 36.4 pg — ABNORMAL HIGH (ref 26.0–34.0)
MCHC: 35.8 g/dL (ref 30.0–36.0)
MCV: 101.7 fL — ABNORMAL HIGH (ref 80.0–100.0)
Monocytes Absolute: 0.3 10*3/uL (ref 0.1–1.0)
Monocytes Relative: 7 %
Neutro Abs: 3.3 10*3/uL (ref 1.7–7.7)
Neutrophils Relative %: 82 %
Platelet Count: 150 10*3/uL (ref 150–400)
RBC: 2.97 MIL/uL — ABNORMAL LOW (ref 4.22–5.81)
RDW: 15.6 % — ABNORMAL HIGH (ref 11.5–15.5)
WBC Count: 4 10*3/uL (ref 4.0–10.5)
nRBC: 0 % (ref 0.0–0.2)

## 2021-07-27 LAB — CMP (CANCER CENTER ONLY)
ALT: 14 U/L (ref 0–44)
AST: 15 U/L (ref 15–41)
Albumin: 3.7 g/dL (ref 3.5–5.0)
Alkaline Phosphatase: 92 U/L (ref 38–126)
Anion gap: 8 (ref 5–15)
BUN: 11 mg/dL (ref 8–23)
CO2: 26 mmol/L (ref 22–32)
Calcium: 9.1 mg/dL (ref 8.9–10.3)
Chloride: 104 mmol/L (ref 98–111)
Creatinine: 0.83 mg/dL (ref 0.61–1.24)
GFR, Estimated: >60 mL/min (ref >60)
Glucose, Bld: 155 mg/dL — ABNORMAL HIGH (ref 70–99)
Potassium: 4 mmol/L (ref 3.5–5.1)
Sodium: 138 mmol/L (ref 135–145)
Total Bilirubin: 0.4 mg/dL (ref 0.3–1.2)
Total Protein: 7.1 g/dL (ref 6.5–8.1)

## 2021-07-27 LAB — TOTAL PROTEIN, URINE DIPSTICK: Protein, ur: NEGATIVE mg/dL

## 2021-07-27 MED ORDER — HEPARIN SOD (PORK) LOCK FLUSH 100 UNIT/ML IV SOLN
500.0000 [IU] | Freq: Once | INTRAVENOUS | Status: AC | PRN
  Administered 2021-07-27: 500 [IU]

## 2021-07-27 MED ORDER — SODIUM CHLORIDE 0.9% FLUSH
10.0000 mL | Freq: Once | INTRAVENOUS | Status: AC
  Administered 2021-07-27: 10 mL

## 2021-07-27 MED ORDER — SODIUM CHLORIDE 0.9 % IV SOLN
15.0000 mg/kg | Freq: Once | INTRAVENOUS | Status: AC
  Administered 2021-07-27: 1600 mg via INTRAVENOUS
  Filled 2021-07-27: qty 64

## 2021-07-27 MED ORDER — SODIUM CHLORIDE 0.9 % IV SOLN: Freq: Once | INTRAVENOUS | Status: AC

## 2021-07-27 MED ORDER — SODIUM CHLORIDE 0.9% FLUSH
10.0000 mL | INTRAVENOUS | Status: DC | PRN
  Administered 2021-07-27: 10 mL

## 2021-07-27 NOTE — Patient Instructions (Signed)
[facility]COL[facility]  Discharge Instructions: Thank you for choosing [facility] to provide your oncology and hematology care.   If you have a lab appointment with the [facility], please go directly to the [facility] and check in at the registration area.   Wear comfortable clothing and clothing appropriate for easy access to any Portacath or PICC line.   We strive to give you quality time with your provider. You may need to reschedule your appointment if you arrive late (15 or more minutes).  Arriving late affects you and other patients whose appointments are after yours.  Also, if you miss three or more appointments without notifying the office, you may be dismissed from the clinic at the provider's discretion.      For prescription refill requests, have your pharmacy contact our office and allow 72 hours for refills to be completed.    Today you received the following chemotherapy and/or immunotherapy agent: Bevacizumab Noah Charon).     To help prevent nausea and vomiting after your treatment, we encourage you to take your nausea medication as directed.  BELOW ARE SYMPTOMS THAT SHOULD BE REPORTED IMMEDIATELY: *FEVER GREATER THAN 100.4 F (38 C) OR HIGHER *CHILLS OR SWEATING *NAUSEA AND VOMITING THAT IS NOT CONTROLLED WITH YOUR NAUSEA MEDICATION *UNUSUAL SHORTNESS OF BREATH *UNUSUAL BRUISING OR BLEEDING *URINARY PROBLEMS (pain or burning when urinating, or frequent urination) *BOWEL PROBLEMS (unusual diarrhea, constipation, pain near the anus) TENDERNESS IN MOUTH AND THROAT WITH OR WITHOUT PRESENCE OF ULCERS (sore throat, sores in mouth, or a toothache) UNUSUAL RASH, SWELLING OR PAIN  UNUSUAL VAGINAL DISCHARGE OR ITCHING   Items with * indicate a potential emergency and should be followed up as soon as possible or go to the Emergency Department if any problems should occur.  Please show the CHEMOTHERAPY ALERT CARD or IMMUNOTHERAPY ALERT CARD at  check-in to the Emergency Department and triage nurse.  Should you have questions after your visit or need to cancel or reschedule your appointment, please contact [facility]  Dept: [phone]  and follow the prompts.  Office hours are 8:00 a.m. to 4:30 p.m. Monday - Friday. Please note that voicemails left after 4:00 p.m. may not be returned until the following business day.  We are closed weekends and major holidays. You have access to a nurse at all times for urgent questions. Please call the main number to the clinic Dept: [phone] and follow the prompts.   For any non-urgent questions, you may also contact your provider using MyChart. We now offer e-Visits for anyone 71 and older to request care online for non-urgent symptoms. For details visit [email or web address].   Also download the MyChart app! Go to the app store, search "MyChart", open the app, select [facility], and log in with your MyChart username and password.  Due to Covid, a mask is required upon entering the hospital/clinic. If you do not have a mask, one will be given to you upon arrival. For doctor visits, patients may have 1 support person aged 71 or older with them. For treatment visits, patients cannot have anyone with them due to current Covid guidelines and our immunocompromised population.

## 2021-07-27 NOTE — Progress Notes (Signed)
[facility] [phone]   [phone]  OFFICE PROGRESS NOTE  Carlena Hurl, PA-C [place] [id number]  DIAGNOSIS: Stage IB (T2, N0, M0) unresectable right malignant pleural mesothelioma.,  Epithelioid type diagnosed in June 2022.  PRIOR THERAPY: Status post right VATS with pleural biopsy and talc pleurodesis under the care of Dr. Roxan Hockey on 02/27/2021.  CURRENT THERAPY: Systemic chemotherapy with carboplatin for AUC of 5, Alimta 500 Mg/M2 and Avastin 15 mg/KG every 3 weeks.  First dose March 23, 2021.  Status post 6 cycles  INTERVAL HISTORY: Guy Evans 70 y.o. male returns to the clinic today for follow-up visit accompanied by his wife.  The patient is feeling fine today with no concerning complaints except for mild cough.  He denied having any chest pain, shortness of breath or hemoptysis.  He denied having any fever or chills.  He has no nausea, vomiting, diarrhea or constipation.  He has no headache or visual changes.  He denied having any significant weight loss or night sweats.  He continues to tolerate his treatment with chemotherapy fairly well.  He had repeat CT scan of the chest performed recently and he is here for evaluation and discussion of his scan results.  MEDICAL HISTORY: Past Medical History:  Diagnosis Date   Arthritis    hands   Enlarged prostate    takes Terazosin daily   Gastroparesis    GERD (gastroesophageal reflux disease)    occasionally but no meds required   H/O cardiovascular stress test 10/2019   nuclear, normal, Dr. Fransico Him   History of colon polyps    History of gastric ulcer    Joint pain    knees and ankles   Urinary frequency     ALLERGIES:  has No Known Allergies.  MEDICATIONS:  Current Outpatient Medications  Medication Sig Dispense Refill   acetaminophen (TYLENOL) 500 MG tablet Take 2 tablets (1,000 mg total) by mouth every 6 (six) hours as needed. 30 tablet 0    albuterol (VENTOLIN HFA) 108 (90 Base) MCG/ACT inhaler Inhale 2 puffs into the lungs every 6 (six) hours as needed for wheezing or shortness of breath. (Patient not taking: No sig reported) 8 g 0   aspirin 81 MG EC tablet Take 81 mg by mouth daily.     atorvastatin (LIPITOR) 80 MG tablet TAKE 1 TABLET BY MOUTH EVERY DAY 90 tablet 0   dexamethasone (DECADRON) 4 MG tablet TAKE 1 TABLET BY MOUTH TWICE A DAY THE DAY BEFORE DAY OF ,AND DAY AFTER CHEMO EVERY 3 WEEKS 40 tablet 1   Fluticasone-Umeclidin-Vilant (TRELEGY ELLIPTA) 100-62.5-25 MCG/INH AEPB Inhale 1 puff into the lungs daily. (Patient not taking: No sig reported) 28 each 5   folic acid (FOLVITE) 1 MG tablet TAKE 1 TABLET BY MOUTH EVERY DAY 90 tablet 1   glucosamine-chondroitin 500-400 MG tablet Take 1 tablet by mouth daily.     L-Lysine 1000 MG TABS Take 1-2 tablets by mouth as needed (Daily as needed).     lidocaine-prilocaine (EMLA) cream Apply to the Port-A-Cath site 30-60-minute before chemotherapy. 30 g 0   magic mouthwash SOLN Take 5 mLs by mouth 3 (three) times daily as needed for mouth pain. 240 mL 0   omeprazole (PRILOSEC) 20 MG capsule Take 20 mg by mouth daily.     oxyCODONE (OXY IR/ROXICODONE) 5 MG immediate release tablet Take 1-2 tablets (5-10 mg total) by mouth every 4 (four) hours as needed  for moderate pain. (Patient not taking: No sig reported) 30 tablet 0   phenylephrine (NEO-SYNEPHRINE) 1 % nasal spray Place 1 drop into both nostrils at bedtime as needed for congestion.     prochlorperazine (COMPAZINE) 10 MG tablet TAKE 1 TABLET BY MOUTH EVERY 6 HOURS AS NEEDED FOR NAUSEA OR VOMITING. 30 tablet 0   terazosin (HYTRIN) 10 MG capsule TAKE 1 CAPSULE BY MOUTH EVERY DAY 90 capsule 0   Turmeric (QC TUMERIC COMPLEX PO) Take 450 mg by mouth daily.     No current facility-administered medications for this visit.    SURGICAL HISTORY:  Past Surgical History:  Procedure Laterality Date   CHOLECYSTECTOMY N/A 11/19/2013   Procedure:  LAPAROSCOPIC CHOLECYSTECTOMY;  Surgeon: Harl Bowie, MD;  Location: [facility];  Service: General;  Laterality: N/A;   COLONOSCOPY     DIAGNOSTIC LAPAROSCOPY     lap chole   ESOPHAGOGASTRODUODENOSCOPY     IR IMAGING GUIDED PORT INSERTION  03/11/2021   skin spots removed and tested     done in the office   THORACENTESIS N/A 01/14/2021   Procedure: THORACENTESIS;  Surgeon: Collene Gobble, MD;  Location: [facility];  Service: Cardiopulmonary;  Laterality: N/A;   THORACENTESIS N/A 01/23/2021   Procedure: THORACENTESIS;  Surgeon: Collene Gobble, MD;  Location[facility] ENDOSCOPY;  Service: Cardiopulmonary;  Laterality: N/A;   VIDEO ASSISTED THORACOSCOPY (VATS) W/TALC PLEUADESIS Right 02/27/2021   Procedure: VIDEO ASSISTED THORACOSCOPY (VATS) WITH  TALC PLEUADESIS and PLEURAL BIOPSY;  Surgeon: Melrose Nakayama, MD;  Location: [facility] OR;  Service: Thoracic;  Laterality: Right;    REVIEW OF SYSTEMS:  Constitutional: positive for fatigue Eyes: negative Ears, nose, mouth, throat, and face: negative Respiratory: positive for cough Cardiovascular: negative Gastrointestinal: negative Genitourinary:negative Integument/breast: negative Hematologic/lymphatic: negative Musculoskeletal:negative Neurological: negative Behavioral/Psych: negative Endocrine: negative Allergic/Immunologic: negative   PHYSICAL EXAMINATION: General appearance: alert, cooperative, fatigued, and no distress Head: Normocephalic, without obvious abnormality, atraumatic Neck: no adenopathy, no JVD, supple, symmetrical, trachea midline, and thyroid not enlarged, symmetric, no tenderness/mass/nodules Lymph nodes: Cervical, supraclavicular, and axillary nodes normal. Resp: clear to auscultation bilaterally Back: symmetric, no curvature. ROM normal. No CVA tenderness. Cardio: regular rate and rhythm, S1, S2 normal, no murmur, click, rub or gallop GI: soft, non-tender; bowel sounds normal; no masses,  no organomegaly Extremities:  extremities normal, atraumatic, no cyanosis or edema Neurologic: Alert and oriented X 3, normal strength and tone. Normal symmetric reflexes. Normal coordination and gait  ECOG PERFORMANCE STATUS: 1 - Symptomatic but completely ambulatory  Blood pressure (!) 144/76, pulse 76, temperature (!) 97.5 F (36.4 C), resp. rate 18, weight 231 lb 14.4 oz (105.2 kg), SpO2 99 %.  LABORATORY DATA: Lab Results  Component Value Date   WBC 4.0 07/27/2021   HGB 10.8 (L) 07/27/2021   HCT 30.2 (L) 07/27/2021   MCV 101.7 (H) 07/27/2021   PLT 150 07/27/2021      Chemistry      Component Value Date/Time   NA 138 07/27/2021 1106   NA 141 10/20/2020 1030   K 4.0 07/27/2021 1106   CL 104 07/27/2021 1106   CO2 26 07/27/2021 1106   BUN 11 07/27/2021 1106   BUN 13 10/20/2020 1030   CREATININE 0.83 07/27/2021 1106   CREATININE 1.03 10/18/2016 1027      Component Value Date/Time   CALCIUM 9.1 07/27/2021 1106   ALKPHOS 92 07/27/2021 1106   AST 15 07/27/2021 1106   ALT 14 07/27/2021 1106   BILITOT 0.4 07/27/2021  1106       RADIOGRAPHIC STUDIES: CT Chest W Contrast  Result Date: 07/25/2021 CLINICAL DATA:  Mesothelioma diagnosed 6/21.  Ongoing chemotherapy. EXAM: CT CHEST WITH CONTRAST TECHNIQUE: Multidetector CT imaging of the chest was performed during intravenous contrast administration. CONTRAST:  69mL OMNIPAQUE IOHEXOL 350 MG/ML SOLN COMPARISON:  05/21/2021 FINDINGS: Cardiovascular: Right Port-A-Cath tip mid right atrium. Aortic atherosclerosis. Normal heart size, without pericardial effusion. Three vessel coronary artery calcification. No central pulmonary embolism, on this non-dedicated study. Mediastinum/Nodes: No supraclavicular adenopathy. No mediastinal or hilar adenopathy. Lungs/Pleura: Status post right-sided talc pleurodesis. Similar trace inferior right pleural fluid. Moderate centrilobular and paraseptal emphysema. Extensive right-sided pleural-based nodularity again identified. index  right-sided nodule measures 11 mm laterally on 79/5 versus 10 mm on the prior exam (when remeasured). Anteromedial index nodule measures 11 mm on 96/5 and is similar to on the prior exam (when remeasured). Index more inferior and lateral right-sided nodule measures 1.4 cm on 89/5 and is similar to on the prior exam when remeasured in a similar way. Clear left lung.  No left pleural nodularity. Upper Abdomen: High left hepatic lobe 1.6 cm cyst. Subcentimeter right hepatic lobe cysts. Cholecystectomy. Normal imaged portions of the spleen, stomach, pancreas, kidneys. Minimal bilateral adrenal nodularity is unchanged. Musculoskeletal: Accentuation of expected thoracic kyphosis. IMPRESSION: 1. No significant change in right-sided pleural nodularity, consistent with the clinical history of mesothelioma. Similar trace right pleural fluid in the setting of prior talc pleurodesis. 2. No thoracic adenopathy. 3. Aortic atherosclerosis (ICD10-I70.0), coronary artery atherosclerosis and emphysema (ICD10-J43.9). Electronically Signed   By: Abigail Miyamoto M.D.   On: 07/25/2021 08:33    ASSESSMENT AND PLAN: This is a very pleasant 70 years old white male recently diagnosed with unresectable stage IB (T2, N0, M0) malignant pleural mesothelioma, epithelioid type diagnosed and June 2022 and presented with extensive involvement of the right hemithorax. The patient is status post VATS with pleural biopsies and talc pleurodesis in June 2022 under the care of Dr. Roxan Hockey. The patient is currently on systemic chemotherapy with carboplatin for AUC of 5, Alimta 500 Mg/M2 and Avastin 15 Mg/KG every 3 weeks.  First dose March 23, 2021.  Status post 6 cycles.  The patient has been tolerating this treatment well with no concerning adverse effects except for mild fatigue. He had repeat CT scan of the chest performed recently.  I personally and independently reviewed the scan and discussed the results with the patient and his wife. Has a  scan showed no concerning findings for disease progression. I recommended for the patient to proceed with the first cycle of his maintenance therapy today with single agent Avastin. He will come back for follow-up visit in 3 weeks for evaluation before the next cycle of his treatment. He was advised to call immediately if he has any concerning symptoms in the interval.  The patient voices understanding of current disease status and treatment options and is in agreement with the current care plan.  All questions were answered. The patient knows to call the clinic with any problems, questions or concerns. We can certainly see the patient much sooner if necessary.  Disclaimer: This note was dictated with voice recognition software. Similar sounding words can inadvertently be transcribed and may not be corrected upon review.

## 2021-08-17 ENCOUNTER — Encounter: Payer: Self-pay | Admitting: Internal Medicine

## 2021-08-17 ENCOUNTER — Inpatient Hospital Stay: Payer: Medicare HMO | Attending: Internal Medicine

## 2021-08-17 ENCOUNTER — Inpatient Hospital Stay: Payer: Medicare HMO

## 2021-08-17 ENCOUNTER — Other Ambulatory Visit: Payer: Self-pay

## 2021-08-17 ENCOUNTER — Inpatient Hospital Stay (HOSPITAL_BASED_OUTPATIENT_CLINIC_OR_DEPARTMENT_OTHER): Payer: Medicare HMO | Admitting: Internal Medicine

## 2021-08-17 VITALS — BP 128/64 | HR 69

## 2021-08-17 VITALS — BP 154/71 | HR 83 | Temp 97.5°F | Resp 19 | Ht 68.0 in | Wt 237.0 lb

## 2021-08-17 DIAGNOSIS — C450 Mesothelioma of pleura: Secondary | ICD-10-CM | POA: Insufficient documentation

## 2021-08-17 DIAGNOSIS — Z79899 Other long term (current) drug therapy: Secondary | ICD-10-CM | POA: Diagnosis not present

## 2021-08-17 DIAGNOSIS — Z5112 Encounter for antineoplastic immunotherapy: Secondary | ICD-10-CM | POA: Diagnosis present

## 2021-08-17 DIAGNOSIS — Z7952 Long term (current) use of systemic steroids: Secondary | ICD-10-CM | POA: Insufficient documentation

## 2021-08-17 DIAGNOSIS — Z95828 Presence of other vascular implants and grafts: Secondary | ICD-10-CM

## 2021-08-17 DIAGNOSIS — Z5111 Encounter for antineoplastic chemotherapy: Secondary | ICD-10-CM

## 2021-08-17 DIAGNOSIS — Z7982 Long term (current) use of aspirin: Secondary | ICD-10-CM | POA: Diagnosis not present

## 2021-08-17 DIAGNOSIS — N400 Enlarged prostate without lower urinary tract symptoms: Secondary | ICD-10-CM | POA: Diagnosis not present

## 2021-08-17 LAB — TOTAL PROTEIN, URINE DIPSTICK: Protein, ur: <30 mg/dL — AB

## 2021-08-17 LAB — CMP (CANCER CENTER ONLY)
ALT: 12 U/L (ref 0–44)
AST: 14 U/L — ABNORMAL LOW (ref 15–41)
Albumin: 3.6 g/dL (ref 3.5–5.0)
Alkaline Phosphatase: 83 U/L (ref 38–126)
Anion gap: 9 (ref 5–15)
BUN: 15 mg/dL (ref 8–23)
CO2: 25 mmol/L (ref 22–32)
Calcium: 9.5 mg/dL (ref 8.9–10.3)
Chloride: 104 mmol/L (ref 98–111)
Creatinine: 0.85 mg/dL (ref 0.61–1.24)
GFR, Estimated: >60 mL/min (ref >60)
Glucose, Bld: 157 mg/dL — ABNORMAL HIGH (ref 70–99)
Potassium: 3.9 mmol/L (ref 3.5–5.1)
Sodium: 138 mmol/L (ref 135–145)
Total Bilirubin: 0.6 mg/dL (ref 0.3–1.2)
Total Protein: 7.1 g/dL (ref 6.5–8.1)

## 2021-08-17 LAB — CBC WITH DIFFERENTIAL (CANCER CENTER ONLY)
Abs Immature Granulocytes: 0.03 10*3/uL (ref 0.00–0.07)
Basophils Absolute: 0 10*3/uL (ref 0.0–0.1)
Basophils Relative: 0 %
Eosinophils Absolute: 0 10*3/uL (ref 0.0–0.5)
Eosinophils Relative: 0 %
HCT: 35.8 % — ABNORMAL LOW (ref 39.0–52.0)
Hemoglobin: 12.3 g/dL — ABNORMAL LOW (ref 13.0–17.0)
Immature Granulocytes: 0 %
Lymphocytes Relative: 7 %
Lymphs Abs: 0.7 10*3/uL (ref 0.7–4.0)
MCH: 34.7 pg — ABNORMAL HIGH (ref 26.0–34.0)
MCHC: 34.4 g/dL (ref 30.0–36.0)
MCV: 101.1 fL — ABNORMAL HIGH (ref 80.0–100.0)
Monocytes Absolute: 0.7 10*3/uL (ref 0.1–1.0)
Monocytes Relative: 7 %
Neutro Abs: 7.8 10*3/uL — ABNORMAL HIGH (ref 1.7–7.7)
Neutrophils Relative %: 86 %
Platelet Count: 201 10*3/uL (ref 150–400)
RBC: 3.54 MIL/uL — ABNORMAL LOW (ref 4.22–5.81)
RDW: 13.4 % (ref 11.5–15.5)
WBC Count: 9.2 10*3/uL (ref 4.0–10.5)
nRBC: 0 % (ref 0.0–0.2)

## 2021-08-17 MED ORDER — HEPARIN SOD (PORK) LOCK FLUSH 100 UNIT/ML IV SOLN: 250.0000 [IU] | Freq: Once | INTRAVENOUS | Status: DC

## 2021-08-17 MED ORDER — SODIUM CHLORIDE 0.9% FLUSH: 10.0000 mL | Freq: Once | INTRAVENOUS | Status: DC

## 2021-08-17 MED ORDER — SODIUM CHLORIDE 0.9 % IV SOLN
15.0000 mg/kg | Freq: Once | INTRAVENOUS | Status: AC
Start: 2021-08-17 — End: 2021-08-17
  Administered 2021-08-17: 1600 mg via INTRAVENOUS
  Filled 2021-08-17: qty 64

## 2021-08-17 MED ORDER — SODIUM CHLORIDE 0.9% FLUSH
10.0000 mL | INTRAVENOUS | Status: DC | PRN
  Administered 2021-08-17: 10 mL

## 2021-08-17 MED ORDER — HEPARIN SOD (PORK) LOCK FLUSH 100 UNIT/ML IV SOLN
500.0000 [IU] | Freq: Once | INTRAVENOUS | Status: AC | PRN
  Administered 2021-08-17: 500 [IU]

## 2021-08-17 MED ORDER — SODIUM CHLORIDE 0.9 % IV SOLN: Freq: Once | INTRAVENOUS | Status: AC

## 2021-08-17 NOTE — Progress Notes (Signed)
Per Dr. Providence Lanius to treat with today's urine protein.

## 2021-08-17 NOTE — Patient Instructions (Signed)
[employer], Adult A central line is a long, thin tube (catheter) that is put into a vein so that it goes to a large vein above your heart. It can be used to: Give you medicine or fluids. Give you food and nutrients. Take blood or give you blood for testing or treatments. Types of central lines There are four main types of central lines: Peripherally inserted central catheter (PICC) line. This type is usually put in the upper arm and goes up the arm to the heart. Tunneled central line. This type is placed in a large vein in the neck, chest, or groin. It is tunneled under the skin and brought out through a second incision. Non-tunneled central line. This type is used for a shorter time than other types, usually for 7 days at the most. It is inserted in the neck, chest, or groin. Implanted port. This type can stay in place longer than other types of central lines. It is normally put in the upper chest but can also be placed in the upper arm or the belly. Surgery is needed to put it in and take it out. The type of central line you get will depend on how long you need it and your medical condition. Tell a doctor about: Any allergies you have. All medicines you are taking. These include vitamins, herbs, eye drops, creams, and over-the-counter medicines. Any problems you or family members have had with anesthetic medicines. Any blood disorders you have. Any surgeries you have had. Any medical conditions you have. Whether you are pregnant or may be pregnant. What are the risks? Generally, central lines are safe. However, problems may occur, including: Infection. A blood clot. Bleeding from the place where the central line was inserted. Getting a hole or crack in the central line. If this happens, the central line will need to be replaced. Central line failure. The catheter moving or coming out of place. What happens before the procedure? Medicines Ask your doctor about changing or  stopping: Your normal medicines. Vitamins, herbs, and supplements. Over-the-counter medicines. Do not take aspirin or ibuprofen unless you are told to. General instructions Follow instructions from your doctor about eating or drinking. For your safety, your doctor may: [place] the area of the procedure. Remove hair at the procedure site. Ask you to wash with a soap that kills germs. Plan to have a responsible adult take you home from the hospital or clinic. If you will be going home right after the procedure, plan to have a responsible adult care for you for the time you are told. This is important. What happens during the procedure? An IV tube will be put into one of your veins. You may be given: A sedative. This medicine helps you relax. Anesthetics. These medicines numb certain areas of your body. Your skin will be cleaned with a germ-killing (antiseptic) solution. You may be covered with clean drapes. Your blood pressure, heart rate, breathing rate, and blood oxygen level will be monitored during the procedure. The central line will be put into the vein and moved through it to the correct spot. The doctor may use X-ray equipment to help guide the central line to the right place. A bandage (dressing) will be placed over the insertion area. The procedure may vary among doctors and hospitals. What can I expect after the procedure? You will be monitored until you leave the hospital or clinic. This includes checking your blood pressure, heart rate, breathing rate, and blood oxygen level. Caps may  be placed on the ends of the central line tubing. If you were given a sedative during your procedure, do not drive or use machines until your doctor says that it is safe. Follow these instructions at home: Caring for the tube  Follow instructions from your doctor about: Flushing the tube. Cleaning the tube and the area around it. Only use germ-free (sterile) supplies to flush. The supplies  should be from your doctor, a pharmacy, or another place that your doctor recommends. Before you flush the tube or clean the area around the tube: Wash your hands with soap and water for at least 20 seconds. If you cannot use soap and water, use hand sanitizer. Clean the central line hub with rubbing alcohol. To do this: Scrub it using a twisting motion and rub for 10 to 15 seconds or for 30 twists. Follow the manufacturer's instructions. Be sure you scrub the top of the hub, not just the sides. Never reuse alcohol pads. Let the hub dry before use. Keep it from touching anything while drying. Caring for your skin Check the skin around the central line every day for signs of infection. Check for: Redness, swelling, or pain. Fluid or blood. Warmth. Pus or a bad smell. Keep the area where the tube was put in clean and dry. Change bandages only as told by your doctor. Keep your bandage dry. If a bandage gets wet, have it changed right away. General instructions Keep the tube clamped, unless it is being used. If you or someone else accidentally pulls on the tube, make sure: The bandage is okay. There is no bleeding. The tube has not been pulled out. Do not use scissors or sharp objects near the tube. Do not take baths, swim, or use a hot tub until your doctor says it is okay. Ask your doctor if you may take showers. You may only be allowed to take sponge baths. Ask your doctor what activities are safe for you. Your doctor may tell you not to lift anything or move your arm too much. Take over-the-counter and prescription medicines only as told by your doctor. Keep all follow-up visits. Storing and throwing away supplies Keep your supplies in a clean, dry location. Throw away any used syringes in a container that is only for sharp items (sharps container). You can buy a sharps container from a pharmacy, or you can make one by using an empty hard plastic bottle with a cover. Place any used  bandages or infusion bags into a plastic bag. Throw that bag in the trash. Contact a doctor if: You have any of these signs of infection where the tube was put in: Redness, swelling, or pain. Fluid or blood. Warmth. Pus or a bad smell. Get help right away if: You have: A fever or chills. Shortness of breath. Pain in your chest. A fast heartbeat. Swelling in your neck, face, chest, or arm. You feel dizzy or you faint. There are red lines coming from where the tube was put in. The area where the tube was put in is bleeding and the bleeding will not stop. Your tube is hard to flush. You do not get a blood return from the tube. The tube gets loose or comes out. The tube has a hole or a tear. The tube leaks. Summary A central line is a long, thin tube (catheter) that is put in your vein. It can be used to give you medicine, food, or fluids. Follow instructions from your doctor about flushing  and cleaning the tube. Keep the area where the tube was put in clean and dry. Ask your doctor what activities are safe for you. This information is not intended to replace advice given to you by your health care provider. Make sure you discuss any questions you have with your health care provider. Document Revised: 04/24/2020 Document Reviewed: 04/24/2020 Elsevier Patient Education  2022 [employer].

## 2021-08-17 NOTE — Progress Notes (Signed)
[facility] [phone]   [phone]  OFFICE PROGRESS NOTE  Carlena Hurl, PA-C [place] [address]  DIAGNOSIS: Stage IB (T2, N0, M0) unresectable right malignant pleural mesothelioma.,  Epithelioid type diagnosed in June 2022.  PRIOR THERAPY: Status post right VATS with pleural biopsy and talc pleurodesis under the care of Dr. Roxan Hockey on 02/27/2021.  CURRENT THERAPY: Systemic chemotherapy with carboplatin for AUC of 5, Alimta 500 Mg/M2 and Avastin 15 mg/KG every 3 weeks.  First dose March 23, 2021.  Status post 7 cycles.  Starting from cycle #7 he is on treatment with single agent Avastin.  INTERVAL HISTORY: Guy Evans 70 y.o. male returns to the clinic today for follow-up visit.  His wife was available by phone during the visit.  The patient is feeling fine today with no concerning complaints except for occasional blood-tinged nasal mucus.  He also continues to have fatigue and mild cough.  He denied having any chest pain, shortness of breath or hemoptysis.  He has no nausea, vomiting, diarrhea or constipation.  He has no headache or visual changes.  He continues to tolerate his treatment with Avastin fairly well.   MEDICAL HISTORY: Past Medical History:  Diagnosis Date   Arthritis    hands   Enlarged prostate    takes Terazosin daily   Gastroparesis    GERD (gastroesophageal reflux disease)    occasionally but no meds required   H/O cardiovascular stress test 10/2019   nuclear, normal, Dr. Fransico Him   History of colon polyps    History of gastric ulcer    Joint pain    knees and ankles   Urinary frequency     ALLERGIES:  has No Known Allergies.  MEDICATIONS:  Current Outpatient Medications  Medication Sig Dispense Refill   acetaminophen (TYLENOL) 500 MG tablet Take 2 tablets (1,000 mg total) by mouth every 6 (six) hours as needed. 30 tablet 0   albuterol (VENTOLIN HFA) 108 (90 Base) MCG/ACT inhaler  Inhale 2 puffs into the lungs every 6 (six) hours as needed for wheezing or shortness of breath. 8 g 0   aspirin 81 MG EC tablet Take 81 mg by mouth daily.     atorvastatin (LIPITOR) 80 MG tablet TAKE 1 TABLET BY MOUTH EVERY DAY 90 tablet 0   dexamethasone (DECADRON) 4 MG tablet TAKE 1 TABLET BY MOUTH TWICE A DAY THE DAY BEFORE DAY OF ,AND DAY AFTER CHEMO EVERY 3 WEEKS 40 tablet 1   Fluticasone-Umeclidin-Vilant (TRELEGY ELLIPTA) 100-62.5-25 MCG/INH AEPB Inhale 1 puff into the lungs daily. 28 each 5   folic acid (FOLVITE) 1 MG tablet TAKE 1 TABLET BY MOUTH EVERY DAY 90 tablet 1   glucosamine-chondroitin 500-400 MG tablet Take 1 tablet by mouth daily.     L-Lysine 1000 MG TABS Take 1-2 tablets by mouth as needed (Daily as needed).     lidocaine-prilocaine (EMLA) cream Apply to the Port-A-Cath site 30-60-minute before chemotherapy. 30 g 0   magic mouthwash SOLN Take 5 mLs by mouth 3 (three) times daily as needed for mouth pain. 240 mL 0   omeprazole (PRILOSEC) 20 MG capsule Take 20 mg by mouth daily.     oxyCODONE (OXY IR/ROXICODONE) 5 MG immediate release tablet Take 1-2 tablets (5-10 mg total) by mouth every 4 (four) hours as needed for moderate pain. 30 tablet 0   phenylephrine (NEO-SYNEPHRINE) 1 % nasal spray Place 1 drop into both nostrils at bedtime  as needed for congestion.     prochlorperazine (COMPAZINE) 10 MG tablet TAKE 1 TABLET BY MOUTH EVERY 6 HOURS AS NEEDED FOR NAUSEA OR VOMITING. 30 tablet 0   terazosin (HYTRIN) 10 MG capsule TAKE 1 CAPSULE BY MOUTH EVERY DAY 90 capsule 0   Turmeric (QC TUMERIC COMPLEX PO) Take 450 mg by mouth daily.     No current facility-administered medications for this visit.   Facility-Administered Medications Ordered in Other Visits  Medication Dose Route Frequency Provider Last Rate Last Admin   heparin lock flush 100 unit/mL  250 Units Intracatheter Once Curt Bears, MD       sodium chloride flush (NS) 0.9 % injection 10 mL  10 mL Intracatheter Once  Curt Bears, MD        SURGICAL HISTORY:  Past Surgical History:  Procedure Laterality Date   CHOLECYSTECTOMY N/A 11/19/2013   Procedure: LAPAROSCOPIC CHOLECYSTECTOMY;  Surgeon: Harl Bowie, MD;  Location: [facility];  Service: General;  Laterality: N/A;   COLONOSCOPY     DIAGNOSTIC LAPAROSCOPY     lap chole   ESOPHAGOGASTRODUODENOSCOPY     IR IMAGING GUIDED PORT INSERTION  03/11/2021   skin spots removed and tested     done in the office   THORACENTESIS N/A 01/14/2021   Procedure: THORACENTESIS;  Surgeon: Collene Gobble, MD;  Location: [facility];  Service: Cardiopulmonary;  Laterality: N/A;   THORACENTESIS N/A 01/23/2021   Procedure: THORACENTESIS;  Surgeon: Collene Gobble, MD;  [facility] ENDOSCOPY;  Service: Cardiopulmonary;  Laterality: N/A;   VIDEO ASSISTED THORACOSCOPY (VATS) W/TALC PLEUADESIS Right 02/27/2021   Procedure: VIDEO ASSISTED THORACOSCOPY (VATS) WITH  TALC PLEUADESIS and PLEURAL BIOPSY;  Surgeon: Melrose Nakayama, MD;  Location: [facility] OR;  Service: Thoracic;  Laterality: Right;    REVIEW OF SYSTEMS:  A comprehensive review of systems was negative except for: Constitutional: positive for fatigue Respiratory: positive for cough   PHYSICAL EXAMINATION: General appearance: alert, cooperative, fatigued, and no distress Head: Normocephalic, without obvious abnormality, atraumatic Neck: no adenopathy, no JVD, supple, symmetrical, trachea midline, and thyroid not enlarged, symmetric, no tenderness/mass/nodules Lymph nodes: Cervical, supraclavicular, and axillary nodes normal. Resp: clear to auscultation bilaterally Back: symmetric, no curvature. ROM normal. No CVA tenderness. Cardio: regular rate and rhythm, S1, S2 normal, no murmur, click, rub or gallop GI: soft, non-tender; bowel sounds normal; no masses,  no organomegaly Extremities: extremities normal, atraumatic, no cyanosis or edema  ECOG PERFORMANCE STATUS: 1 - Symptomatic but completely  ambulatory  Blood pressure (!) 154/71, pulse 83, temperature (!) 97.5 F (36.4 C), temperature source Tympanic, resp. rate 19, height 5\' 8"  (1.727 m), weight 237 lb (107.5 kg), SpO2 96 %.  LABORATORY DATA: Lab Results  Component Value Date   WBC 9.2 08/17/2021   HGB 12.3 (L) 08/17/2021   HCT 35.8 (L) 08/17/2021   MCV 101.1 (H) 08/17/2021   PLT 201 08/17/2021      Chemistry      Component Value Date/Time   NA 138 07/27/2021 1106   NA 141 10/20/2020 1030   K 4.0 07/27/2021 1106   CL 104 07/27/2021 1106   CO2 26 07/27/2021 1106   BUN 11 07/27/2021 1106   BUN 13 10/20/2020 1030   CREATININE 0.83 07/27/2021 1106   CREATININE 1.03 10/18/2016 1027      Component Value Date/Time   CALCIUM 9.1 07/27/2021 1106   ALKPHOS 92 07/27/2021 1106   AST 15 07/27/2021 1106   ALT 14 07/27/2021 1106  BILITOT 0.4 07/27/2021 1106       RADIOGRAPHIC STUDIES: CT Chest W Contrast  Result Date: 07/25/2021 CLINICAL DATA:  Mesothelioma diagnosed 6/21.  Ongoing chemotherapy. EXAM: CT CHEST WITH CONTRAST TECHNIQUE: Multidetector CT imaging of the chest was performed during intravenous contrast administration. CONTRAST:  29mL OMNIPAQUE IOHEXOL 350 MG/ML SOLN COMPARISON:  05/21/2021 FINDINGS: Cardiovascular: Right Port-A-Cath tip mid right atrium. Aortic atherosclerosis. Normal heart size, without pericardial effusion. Three vessel coronary artery calcification. No central pulmonary embolism, on this non-dedicated study. Mediastinum/Nodes: No supraclavicular adenopathy. No mediastinal or hilar adenopathy. Lungs/Pleura: Status post right-sided talc pleurodesis. Similar trace inferior right pleural fluid. Moderate centrilobular and paraseptal emphysema. Extensive right-sided pleural-based nodularity again identified. index right-sided nodule measures 11 mm laterally on 79/5 versus 10 mm on the prior exam (when remeasured). Anteromedial index nodule measures 11 mm on 96/5 and is similar to on the prior exam  (when remeasured). Index more inferior and lateral right-sided nodule measures 1.4 cm on 89/5 and is similar to on the prior exam when remeasured in a similar way. Clear left lung.  No left pleural nodularity. Upper Abdomen: High left hepatic lobe 1.6 cm cyst. Subcentimeter right hepatic lobe cysts. Cholecystectomy. Normal imaged portions of the spleen, stomach, pancreas, kidneys. Minimal bilateral adrenal nodularity is unchanged. Musculoskeletal: Accentuation of expected thoracic kyphosis. IMPRESSION: 1. No significant change in right-sided pleural nodularity, consistent with the clinical history of mesothelioma. Similar trace right pleural fluid in the setting of prior talc pleurodesis. 2. No thoracic adenopathy. 3. Aortic atherosclerosis (ICD10-I70.0), coronary artery atherosclerosis and emphysema (ICD10-J43.9). Electronically Signed   By: Abigail Miyamoto M.D.   On: 07/25/2021 08:33    ASSESSMENT AND PLAN: This is a very pleasant 70 years old white male recently diagnosed with unresectable stage IB (T2, N0, M0) malignant pleural mesothelioma, epithelioid type diagnosed and June 2022 and presented with extensive involvement of the right hemithorax. The patient is status post VATS with pleural biopsies and talc pleurodesis in June 2022 under the care of Dr. Roxan Hockey. The patient is currently on systemic chemotherapy with carboplatin for AUC of 5, Alimta 500 Mg/M2 and Avastin 15 Mg/KG every 3 weeks.  First dose March 23, 2021.  Status post 7 cycles.  Starting from cycle #7 the patient is on maintenance treatment with single agent Avastin every 3 weeks.   The patient is tolerating his treatment well except for fatigue. I recommended for him to proceed with cycle #8 today as planned. I will see him back for follow-up visit in 3 weeks for evaluation before starting cycle #9. The patient was advised to call immediately if he has any concerning symptoms in the interval. The patient voices understanding of current  disease status and treatment options and is in agreement with the current care plan.  All questions were answered. The patient knows to call the clinic with any problems, questions or concerns. We can certainly see the patient much sooner if necessary.  Disclaimer: This note was dictated with voice recognition software. Similar sounding words can inadvertently be transcribed and may not be corrected upon review.

## 2021-09-09 ENCOUNTER — Other Ambulatory Visit: Payer: Self-pay | Admitting: Medical Oncology

## 2021-09-09 ENCOUNTER — Other Ambulatory Visit: Payer: Self-pay

## 2021-09-09 ENCOUNTER — Inpatient Hospital Stay: Payer: Medicare HMO | Attending: Internal Medicine

## 2021-09-09 ENCOUNTER — Inpatient Hospital Stay: Payer: Medicare HMO | Admitting: Internal Medicine

## 2021-09-09 ENCOUNTER — Encounter: Payer: Self-pay | Admitting: Internal Medicine

## 2021-09-09 ENCOUNTER — Inpatient Hospital Stay: Payer: Medicare HMO

## 2021-09-09 VITALS — BP 143/68 | HR 86

## 2021-09-09 VITALS — BP 152/89 | HR 105 | Temp 97.7°F | Resp 20 | Ht 68.0 in | Wt 233.3 lb

## 2021-09-09 DIAGNOSIS — Z8719 Personal history of other diseases of the digestive system: Secondary | ICD-10-CM | POA: Diagnosis not present

## 2021-09-09 DIAGNOSIS — Z95828 Presence of other vascular implants and grafts: Secondary | ICD-10-CM

## 2021-09-09 DIAGNOSIS — Z8711 Personal history of peptic ulcer disease: Secondary | ICD-10-CM | POA: Insufficient documentation

## 2021-09-09 DIAGNOSIS — K219 Gastro-esophageal reflux disease without esophagitis: Secondary | ICD-10-CM | POA: Insufficient documentation

## 2021-09-09 DIAGNOSIS — Z5112 Encounter for antineoplastic immunotherapy: Secondary | ICD-10-CM | POA: Diagnosis present

## 2021-09-09 DIAGNOSIS — N400 Enlarged prostate without lower urinary tract symptoms: Secondary | ICD-10-CM | POA: Insufficient documentation

## 2021-09-09 DIAGNOSIS — K1379 Other lesions of oral mucosa: Secondary | ICD-10-CM | POA: Insufficient documentation

## 2021-09-09 DIAGNOSIS — Z7982 Long term (current) use of aspirin: Secondary | ICD-10-CM | POA: Insufficient documentation

## 2021-09-09 DIAGNOSIS — K3184 Gastroparesis: Secondary | ICD-10-CM | POA: Insufficient documentation

## 2021-09-09 DIAGNOSIS — K1230 Oral mucositis (ulcerative), unspecified: Secondary | ICD-10-CM | POA: Diagnosis not present

## 2021-09-09 DIAGNOSIS — Z7952 Long term (current) use of systemic steroids: Secondary | ICD-10-CM | POA: Insufficient documentation

## 2021-09-09 DIAGNOSIS — C450 Mesothelioma of pleura: Secondary | ICD-10-CM | POA: Insufficient documentation

## 2021-09-09 DIAGNOSIS — Z5111 Encounter for antineoplastic chemotherapy: Secondary | ICD-10-CM

## 2021-09-09 LAB — CMP (CANCER CENTER ONLY)
ALT: 15 U/L (ref 0–44)
AST: 18 U/L (ref 15–41)
Albumin: 4.2 g/dL (ref 3.5–5.0)
Alkaline Phosphatase: 78 U/L (ref 38–126)
Anion gap: 9 (ref 5–15)
BUN: 18 mg/dL (ref 8–23)
CO2: 25 mmol/L (ref 22–32)
Calcium: 9.7 mg/dL (ref 8.9–10.3)
Chloride: 102 mmol/L (ref 98–111)
Creatinine: 0.95 mg/dL (ref 0.61–1.24)
GFR, Estimated: >60 mL/min (ref >60)
Glucose, Bld: 157 mg/dL — ABNORMAL HIGH (ref 70–99)
Potassium: 4 mmol/L (ref 3.5–5.1)
Sodium: 136 mmol/L (ref 135–145)
Total Bilirubin: 0.6 mg/dL (ref 0.3–1.2)
Total Protein: 7.6 g/dL (ref 6.5–8.1)

## 2021-09-09 LAB — CBC WITH DIFFERENTIAL (CANCER CENTER ONLY)
Abs Immature Granulocytes: 0.03 10*3/uL (ref 0.00–0.07)
Basophils Absolute: 0 10*3/uL (ref 0.0–0.1)
Basophils Relative: 0 %
Eosinophils Absolute: 0 10*3/uL (ref 0.0–0.5)
Eosinophils Relative: 0 %
HCT: 38.2 % — ABNORMAL LOW (ref 39.0–52.0)
Hemoglobin: 13.2 g/dL (ref 13.0–17.0)
Immature Granulocytes: 0 %
Lymphocytes Relative: 4 %
Lymphs Abs: 0.4 10*3/uL — ABNORMAL LOW (ref 0.7–4.0)
MCH: 33.8 pg (ref 26.0–34.0)
MCHC: 34.6 g/dL (ref 30.0–36.0)
MCV: 97.9 fL (ref 80.0–100.0)
Monocytes Absolute: 0.4 10*3/uL (ref 0.1–1.0)
Monocytes Relative: 4 %
Neutro Abs: 10.4 10*3/uL — ABNORMAL HIGH (ref 1.7–7.7)
Neutrophils Relative %: 92 %
Platelet Count: 169 10*3/uL (ref 150–400)
RBC: 3.9 MIL/uL — ABNORMAL LOW (ref 4.22–5.81)
RDW: 13.1 % (ref 11.5–15.5)
WBC Count: 11.2 10*3/uL — ABNORMAL HIGH (ref 4.0–10.5)
nRBC: 0 % (ref 0.0–0.2)

## 2021-09-09 LAB — TOTAL PROTEIN, URINE DIPSTICK: Protein, ur: NEGATIVE mg/dL

## 2021-09-09 MED ORDER — SODIUM CHLORIDE 0.9% FLUSH
10.0000 mL | Freq: Once | INTRAVENOUS | Status: AC
  Administered 2021-09-09: 10 mL

## 2021-09-09 MED ORDER — SODIUM CHLORIDE 0.9 % IV SOLN
15.0000 mg/kg | Freq: Once | INTRAVENOUS | Status: AC
  Administered 2021-09-09: 1600 mg via INTRAVENOUS
  Filled 2021-09-09: qty 64

## 2021-09-09 MED ORDER — HEPARIN SOD (PORK) LOCK FLUSH 100 UNIT/ML IV SOLN
500.0000 [IU] | Freq: Once | INTRAVENOUS | Status: AC | PRN
  Administered 2021-09-09: 500 [IU]

## 2021-09-09 MED ORDER — SODIUM CHLORIDE 0.9 % IV SOLN: Freq: Once | INTRAVENOUS | Status: AC

## 2021-09-09 MED ORDER — SODIUM CHLORIDE 0.9% FLUSH
10.0000 mL | INTRAVENOUS | Status: DC | PRN
  Administered 2021-09-09: 10 mL

## 2021-09-09 MED ORDER — MAGIC MOUTHWASH: 5.0000 mL | Freq: Three times a day (TID) | ORAL | 0 refills | Status: DC | PRN

## 2021-09-09 NOTE — Patient Instructions (Signed)
[facility] ONCOLOGY   Discharge Instructions: Thank you for choosing [facility] to provide your oncology and hematology care.   If you have a lab appointment with the [facility], please go directly to the [facility] and check in at the registration area.   Wear comfortable clothing and clothing appropriate for easy access to any Portacath or PICC line.   We strive to give you quality time with your provider. You may need to reschedule your appointment if you arrive late (15 or more minutes).  Arriving late affects you and other patients whose appointments are after yours.  Also, if you miss three or more appointments without notifying the office, you may be dismissed from the clinic at the providers discretion.      For prescription refill requests, have your pharmacy contact our office and allow 72 hours for refills to be completed.    Today you received the following chemotherapy and/or immunotherapy agents: Bevacizumab (Avastin)      To help prevent nausea and vomiting after your treatment, we encourage you to take your nausea medication as directed.  BELOW ARE SYMPTOMS THAT SHOULD BE REPORTED IMMEDIATELY: *FEVER GREATER THAN 100.4 F (38 C) OR HIGHER *CHILLS OR SWEATING *NAUSEA AND VOMITING THAT IS NOT CONTROLLED WITH YOUR NAUSEA MEDICATION *UNUSUAL SHORTNESS OF BREATH *UNUSUAL BRUISING OR BLEEDING *URINARY PROBLEMS (pain or burning when urinating, or frequent urination) *BOWEL PROBLEMS (unusual diarrhea, constipation, pain near the anus) TENDERNESS IN MOUTH AND THROAT WITH OR WITHOUT PRESENCE OF ULCERS (sore throat, sores in mouth, or a toothache) UNUSUAL RASH, SWELLING OR PAIN  UNUSUAL VAGINAL DISCHARGE OR ITCHING   Items with * indicate a potential emergency and should be followed up as soon as possible or go to the Emergency Department if any problems should occur.  Please show the CHEMOTHERAPY ALERT CARD or IMMUNOTHERAPY ALERT CARD  at check-in to the Emergency Department and triage nurse.  Should you have questions after your visit or need to cancel or reschedule your appointment, please contact [facility]  Dept: [phone]  and follow the prompts.  Office hours are 8:00 a.m. to 4:30 p.m. Monday - Friday. Please note that voicemails left after 4:00 p.m. may not be returned until the following business day.  We are closed weekends and major holidays. You have access to a nurse at all times for urgent questions. Please call the main number to the clinic Dept: [phone] and follow the prompts.   For any non-urgent questions, you may also contact your provider using MyChart. We now offer e-Visits for anyone 47 and older to request care online for non-urgent symptoms. For details visit [email or web address].   Also download the MyChart app! Go to the app store, search "MyChart", open the app, select [facility], and log in with your MyChart username and password.  Due to Covid, a mask is required upon entering the hospital/clinic. If you do not have a mask, one will be given to you upon arrival. For doctor visits, patients may have 1 support person aged 68 or older with them. For treatment visits, patients cannot have anyone with them due to current Covid guidelines and our immunocompromised population.

## 2021-09-09 NOTE — Progress Notes (Signed)
[facility] [phone]   Fax:([phone]  OFFICE PROGRESS NOTE  Carlena Hurl, PA-C [place] [address]  DIAGNOSIS: Stage IB (T2, N0, M0) unresectable right malignant pleural mesothelioma.,  Epithelioid type diagnosed in June 2022.  PRIOR THERAPY: Status post right VATS with pleural biopsy and talc pleurodesis under the care of Dr. Roxan Hockey on 02/27/2021.  CURRENT THERAPY: Systemic chemotherapy with carboplatin for AUC of 5, Alimta 500 Mg/M2 and Avastin 15 mg/KG every 3 weeks.  First dose March 23, 2021.  Status post 8 cycles.  Starting from cycle #7 he is on treatment with single agent Avastin.  INTERVAL HISTORY: Guy Evans 71 y.o. male returns to the clinic today for follow-up visit.  The patient is feeling fine with no concerning complaints except for few mouth sores and he requested refill of the Magic mouthwash.  He denied having any current chest pain, shortness of breath, cough or hemoptysis.  He has no nausea, vomiting, diarrhea or constipation.  He has no headache or visual changes.  He denied having any fever or chills.  He lost few pounds since his last visit.  He is here today for evaluation before starting cycle #9 of his treatment.   MEDICAL HISTORY: Past Medical History:  Diagnosis Date   Arthritis    hands   Enlarged prostate    takes Terazosin daily   Gastroparesis    GERD (gastroesophageal reflux disease)    occasionally but no meds required   H/O cardiovascular stress test 10/2019   nuclear, normal, Dr. Fransico Him   History of colon polyps    History of gastric ulcer    Joint pain    knees and ankles   Urinary frequency     ALLERGIES:  has No Known Allergies.  MEDICATIONS:  Current Outpatient Medications  Medication Sig Dispense Refill   acetaminophen (TYLENOL) 500 MG tablet Take 2 tablets (1,000 mg total) by mouth every 6 (six) hours as needed. 30 tablet 0   albuterol (VENTOLIN HFA) 108  (90 Base) MCG/ACT inhaler Inhale 2 puffs into the lungs every 6 (six) hours as needed for wheezing or shortness of breath. 8 g 0   aspirin 81 MG EC tablet Take 81 mg by mouth daily.     atorvastatin (LIPITOR) 80 MG tablet TAKE 1 TABLET BY MOUTH EVERY DAY 90 tablet 0   dexamethasone (DECADRON) 4 MG tablet TAKE 1 TABLET BY MOUTH TWICE A DAY THE DAY BEFORE DAY OF ,AND DAY AFTER CHEMO EVERY 3 WEEKS 40 tablet 1   Fluticasone-Umeclidin-Vilant (TRELEGY ELLIPTA) 100-62.5-25 MCG/INH AEPB Inhale 1 puff into the lungs daily. 28 each 5   folic acid (FOLVITE) 1 MG tablet TAKE 1 TABLET BY MOUTH EVERY DAY 90 tablet 1   glucosamine-chondroitin 500-400 MG tablet Take 1 tablet by mouth daily.     L-Lysine 1000 MG TABS Take 1-2 tablets by mouth as needed (Daily as needed).     lidocaine-prilocaine (EMLA) cream Apply to the Port-A-Cath site 30-60-minute before chemotherapy. 30 g 0   magic mouthwash SOLN Take 5 mLs by mouth 3 (three) times daily as needed for mouth pain. 240 mL 0   omeprazole (PRILOSEC) 20 MG capsule Take 20 mg by mouth daily.     oxyCODONE (OXY IR/ROXICODONE) 5 MG immediate release tablet Take 1-2 tablets (5-10 mg total) by mouth every 4 (four) hours as needed for moderate pain. 30 tablet 0   phenylephrine (NEO-SYNEPHRINE) 1 % nasal spray  Place 1 drop into both nostrils at bedtime as needed for congestion.     prochlorperazine (COMPAZINE) 10 MG tablet TAKE 1 TABLET BY MOUTH EVERY 6 HOURS AS NEEDED FOR NAUSEA OR VOMITING. 30 tablet 0   terazosin (HYTRIN) 10 MG capsule TAKE 1 CAPSULE BY MOUTH EVERY DAY 90 capsule 0   Turmeric (QC TUMERIC COMPLEX PO) Take 450 mg by mouth daily.     No current facility-administered medications for this visit.    SURGICAL HISTORY:  Past Surgical History:  Procedure Laterality Date   CHOLECYSTECTOMY N/A 11/19/2013   Procedure: LAPAROSCOPIC CHOLECYSTECTOMY;  Surgeon: Harl Bowie, MD;  Location: [facility];  Service: General;  Laterality: N/A;   COLONOSCOPY      DIAGNOSTIC LAPAROSCOPY     lap chole   ESOPHAGOGASTRODUODENOSCOPY     IR IMAGING GUIDED PORT INSERTION  03/11/2021   skin spots removed and tested     done in the office   THORACENTESIS N/A 01/14/2021   Procedure: THORACENTESIS;  Surgeon: Collene Gobble, MD;  Location: [facility];  Service: Cardiopulmonary;  Laterality: N/A;   THORACENTESIS N/A 01/23/2021   Procedure: THORACENTESIS;  Surgeon: Collene Gobble, MD;  Location: [facility] ENDOSCOPY;  Service: Cardiopulmonary;  Laterality: N/A;   VIDEO ASSISTED THORACOSCOPY (VATS) W/TALC PLEUADESIS Right 02/27/2021   Procedure: VIDEO ASSISTED THORACOSCOPY (VATS) WITH  TALC PLEUADESIS and PLEURAL BIOPSY;  Surgeon: Melrose Nakayama, MD;  Location: [facility];  Service: Thoracic;  Laterality: Right;    REVIEW OF SYSTEMS:  A comprehensive review of systems was negative except for: Constitutional: positive for fatigue   PHYSICAL EXAMINATION: General appearance: alert, cooperative, fatigued, and no distress Head: Normocephalic, without obvious abnormality, atraumatic Neck: no adenopathy, no JVD, supple, symmetrical, trachea midline, and thyroid not enlarged, symmetric, no tenderness/mass/nodules Lymph nodes: Cervical, supraclavicular, and axillary nodes normal. Resp: clear to auscultation bilaterally Back: symmetric, no curvature. ROM normal. No CVA tenderness. Cardio: regular rate and rhythm, S1, S2 normal, no murmur, click, rub or gallop GI: soft, non-tender; bowel sounds normal; no masses,  no organomegaly Extremities: extremities normal, atraumatic, no cyanosis or edema  ECOG PERFORMANCE STATUS: 1 - Symptomatic but completely ambulatory  Blood pressure (!) 152/89, pulse (!) 105, temperature 97.7 F (36.5 C), temperature source Tympanic, resp. rate 20, height 5\' 8"  (1.727 m), weight 233 lb 4.8 oz (105.8 kg), SpO2 97 %.  LABORATORY DATA: Lab Results  Component Value Date   WBC 11.2 (H) 09/09/2021   HGB 13.2 09/09/2021   HCT 38.2 (L) 09/09/2021    MCV 97.9 09/09/2021   PLT 169 09/09/2021      Chemistry      Component Value Date/Time   NA 138 08/17/2021 1054   NA 141 10/20/2020 1030   K 3.9 08/17/2021 1054   CL 104 08/17/2021 1054   CO2 25 08/17/2021 1054   BUN 15 08/17/2021 1054   BUN 13 10/20/2020 1030   CREATININE 0.85 08/17/2021 1054   CREATININE 1.03 10/18/2016 1027      Component Value Date/Time   CALCIUM 9.5 08/17/2021 1054   ALKPHOS 83 08/17/2021 1054   AST 14 (L) 08/17/2021 1054   ALT 12 08/17/2021 1054   BILITOT 0.6 08/17/2021 1054       RADIOGRAPHIC STUDIES: No results found.  ASSESSMENT AND PLAN: This is a very pleasant 71 years old white male recently diagnosed with unresectable stage IB (T2, N0, M0) malignant pleural mesothelioma, epithelioid type diagnosed and June 2022 and presented with extensive involvement of the  right hemithorax. The patient is status post VATS with pleural biopsies and talc pleurodesis in June 2022 under the care of Dr. Roxan Hockey. The patient is currently on systemic chemotherapy with carboplatin for AUC of 5, Alimta 500 Mg/M2 and Avastin 15 Mg/KG every 3 weeks.  First dose March 23, 2021.  Status post 8 cycles.  Starting from cycle #7 the patient is on maintenance treatment with single agent Avastin every 3 weeks.   The patient continues to tolerate this treatment well with no concerning adverse effects. I recommended for him to proceed with cycle #9 of his treatment with single agent Avastin today as planned. I will see him back for follow-up visit in 3 weeks for evaluation before the next cycle of his treatment with repeat CT scan of the chest for restaging of his disease. The patient was advised to call immediately if he has any other concerning symptoms in the interval. The patient voices understanding of current disease status and treatment options and is in agreement with the current care plan.  All questions were answered. The patient knows to call the clinic with any  problems, questions or concerns. We can certainly see the patient much sooner if necessary.  Disclaimer: This note was dictated with voice recognition software. Similar sounding words can inadvertently be transcribed and may not be corrected upon review.

## 2021-09-20 ENCOUNTER — Other Ambulatory Visit: Payer: Self-pay | Admitting: Medical

## 2021-09-26 ENCOUNTER — Other Ambulatory Visit: Payer: Self-pay

## 2021-09-26 ENCOUNTER — Ambulatory Visit (HOSPITAL_BASED_OUTPATIENT_CLINIC_OR_DEPARTMENT_OTHER)
Admission: RE | Admit: 2021-09-26 | Discharge: 2021-09-26 | Disposition: A | Discharge status: Home / Self Care | Payer: Medicare HMO | Source: Ambulatory Visit | Attending: Internal Medicine | Admitting: Internal Medicine

## 2021-09-26 ENCOUNTER — Ambulatory Visit: Admission: RE | Admit: 2021-09-26 | Payer: Self-pay | Source: Ambulatory Visit

## 2021-09-26 DIAGNOSIS — J90 Pleural effusion, not elsewhere classified: Secondary | ICD-10-CM | POA: Diagnosis not present

## 2021-09-26 DIAGNOSIS — I700 Atherosclerosis of aorta: Secondary | ICD-10-CM | POA: Diagnosis not present

## 2021-09-26 DIAGNOSIS — J439 Emphysema, unspecified: Secondary | ICD-10-CM | POA: Diagnosis not present

## 2021-09-26 DIAGNOSIS — C450 Mesothelioma of pleura: Secondary | ICD-10-CM | POA: Insufficient documentation

## 2021-09-26 DIAGNOSIS — R911 Solitary pulmonary nodule: Secondary | ICD-10-CM | POA: Diagnosis not present

## 2021-09-26 MED ORDER — IOHEXOL 300 MG/ML  SOLN
100.0000 mL | Freq: Once | INTRAMUSCULAR | Status: AC | PRN
  Administered 2021-09-26: 75 mL via INTRAVENOUS

## 2021-09-28 ENCOUNTER — Encounter: Payer: Self-pay | Admitting: Internal Medicine

## 2021-09-28 ENCOUNTER — Inpatient Hospital Stay: Payer: Medicare HMO | Admitting: Internal Medicine

## 2021-09-28 ENCOUNTER — Inpatient Hospital Stay: Payer: Medicare HMO

## 2021-09-28 ENCOUNTER — Encounter: Payer: Self-pay | Admitting: *Deleted

## 2021-09-28 ENCOUNTER — Other Ambulatory Visit: Payer: Self-pay

## 2021-09-28 VITALS — BP 149/82 | HR 88 | Temp 97.2°F | Resp 19 | Ht 68.0 in | Wt 230.6 lb

## 2021-09-28 DIAGNOSIS — Z7952 Long term (current) use of systemic steroids: Secondary | ICD-10-CM | POA: Diagnosis not present

## 2021-09-28 DIAGNOSIS — K3184 Gastroparesis: Secondary | ICD-10-CM | POA: Diagnosis not present

## 2021-09-28 DIAGNOSIS — Z5112 Encounter for antineoplastic immunotherapy: Secondary | ICD-10-CM | POA: Insufficient documentation

## 2021-09-28 DIAGNOSIS — C450 Mesothelioma of pleura: Secondary | ICD-10-CM

## 2021-09-28 DIAGNOSIS — N400 Enlarged prostate without lower urinary tract symptoms: Secondary | ICD-10-CM | POA: Diagnosis not present

## 2021-09-28 DIAGNOSIS — Z8719 Personal history of other diseases of the digestive system: Secondary | ICD-10-CM | POA: Diagnosis not present

## 2021-09-28 DIAGNOSIS — Z95828 Presence of other vascular implants and grafts: Secondary | ICD-10-CM

## 2021-09-28 DIAGNOSIS — Z5111 Encounter for antineoplastic chemotherapy: Secondary | ICD-10-CM

## 2021-09-28 DIAGNOSIS — Z7982 Long term (current) use of aspirin: Secondary | ICD-10-CM | POA: Diagnosis not present

## 2021-09-28 DIAGNOSIS — K1379 Other lesions of oral mucosa: Secondary | ICD-10-CM | POA: Diagnosis not present

## 2021-09-28 DIAGNOSIS — K219 Gastro-esophageal reflux disease without esophagitis: Secondary | ICD-10-CM | POA: Diagnosis not present

## 2021-09-28 DIAGNOSIS — Z8711 Personal history of peptic ulcer disease: Secondary | ICD-10-CM | POA: Diagnosis not present

## 2021-09-28 LAB — CBC WITH DIFFERENTIAL (CANCER CENTER ONLY)
Abs Immature Granulocytes: 0.02 10*3/uL (ref 0.00–0.07)
Basophils Absolute: 0 10*3/uL (ref 0.0–0.1)
Basophils Relative: 0 %
Eosinophils Absolute: 0 10*3/uL (ref 0.0–0.5)
Eosinophils Relative: 0 %
HCT: 37.5 % — ABNORMAL LOW (ref 39.0–52.0)
Hemoglobin: 12.9 g/dL — ABNORMAL LOW (ref 13.0–17.0)
Immature Granulocytes: 0 %
Lymphocytes Relative: 7 %
Lymphs Abs: 0.6 10*3/uL — ABNORMAL LOW (ref 0.7–4.0)
MCH: 32.9 pg (ref 26.0–34.0)
MCHC: 34.4 g/dL (ref 30.0–36.0)
MCV: 95.7 fL (ref 80.0–100.0)
Monocytes Absolute: 0.6 10*3/uL (ref 0.1–1.0)
Monocytes Relative: 7 %
Neutro Abs: 7.2 10*3/uL (ref 1.7–7.7)
Neutrophils Relative %: 86 %
Platelet Count: 166 10*3/uL (ref 150–400)
RBC: 3.92 MIL/uL — ABNORMAL LOW (ref 4.22–5.81)
RDW: 13.1 % (ref 11.5–15.5)
WBC Count: 8.4 10*3/uL (ref 4.0–10.5)
nRBC: 0 % (ref 0.0–0.2)

## 2021-09-28 LAB — CMP (CANCER CENTER ONLY)
ALT: 17 U/L (ref 0–44)
AST: 19 U/L (ref 15–41)
Albumin: 3.9 g/dL (ref 3.5–5.0)
Alkaline Phosphatase: 79 U/L (ref 38–126)
Anion gap: 8 (ref 5–15)
BUN: 19 mg/dL (ref 8–23)
CO2: 27 mmol/L (ref 22–32)
Calcium: 9.2 mg/dL (ref 8.9–10.3)
Chloride: 103 mmol/L (ref 98–111)
Creatinine: 0.89 mg/dL (ref 0.61–1.24)
GFR, Estimated: >60 mL/min (ref >60)
Glucose, Bld: 125 mg/dL — ABNORMAL HIGH (ref 70–99)
Potassium: 3.8 mmol/L (ref 3.5–5.1)
Sodium: 138 mmol/L (ref 135–145)
Total Bilirubin: 0.7 mg/dL (ref 0.3–1.2)
Total Protein: 7.7 g/dL (ref 6.5–8.1)

## 2021-09-28 LAB — TOTAL PROTEIN, URINE DIPSTICK: Protein, ur: <30 mg/dL — AB

## 2021-09-28 MED ORDER — SODIUM CHLORIDE 0.9% FLUSH
10.0000 mL | Freq: Once | INTRAVENOUS | Status: AC
  Administered 2021-09-28: 10 mL

## 2021-09-28 NOTE — Progress Notes (Signed)
DISCONTINUE ON PATHWAY REGIMEN - Mesothelioma   Bevacizumab 15 mg/kg IV + Carboplatin AUC=5 IV + Pemetrexed 500 mg/m2 IV q21 Days:   A cycle is every 21 days:     Pemetrexed      Carboplatin      Bevacizumab-xxxx    Bevacizumab 15 mg/kg IV D1 q21 Days:   A cycle is every 21 days:     Bevacizumab-xxxx   **Always confirm dose/schedule in your pharmacy ordering system**  REASON: Disease Progression PRIOR TREATMENT: [id number]: Bevacizumab 15 mg/kg + Carboplatin AUC=5 + Pemetrexed 500 mg/m2 q21 Days x 4-6 Cycles Followed by Bevacizumab 15 mg/kg Maintenance q21 Days Until Progression or Unacceptable Toxicity TREATMENT RESPONSE: Stable Disease (SD)  START ON PATHWAY REGIMEN - Mesothelioma     A cycle is every 42 days:     Nivolumab      Ipilimumab   **Always confirm dose/schedule in your pharmacy ordering system**  Patient Characteristics: Relapsed or Progressive Disease, [place] and Beyond, Prior Chemotherapy Therapeutic Status: Relapsed or Progressive Disease  Intent of Therapy: Non-Curative / Palliative Intent, Discussed with Patient

## 2021-09-28 NOTE — Progress Notes (Signed)
[place] Telephone:([phone]) [phone]   Fax:(336) [phone]  OFFICE PROGRESS NOTE  Carlena Hurl, PA-C [place] [id number]  DIAGNOSIS: Stage IB (T2, N0, M0) unresectable right malignant pleural mesothelioma.,  Epithelioid type diagnosed in June 2022.  PRIOR THERAPY:  1) Status post right VATS with pleural biopsy and talc pleurodesis under the care of Dr. Roxan Hockey on 02/27/2021. 2) Systemic chemotherapy with carboplatin for AUC of 5, Alimta 500 Mg/M2 and Avastin 15 mg/KG every 3 weeks.  First dose March 23, 2021.  Status post 9 cycles.  Starting from cycle #7 he is on treatment with single agent Avastin.  CURRENT THERAPY: Second line treatment with immunotherapy with ipilimumab 1 Mg/KG every 3 weeks in addition to nivolumab 360 Mg IV every 3 weeks.  First dose October 08, 2021.  INTERVAL HISTORY: Eithen Castiglia 71 y.o. male returns to the clinic today for follow-up visit accompanied by his wife.  The patient is feeling fine today with no concerning complaints except for mild fatigue and shortness of breath with exertion.  He has no chest pain, cough or hemoptysis.  He denied having any fever or chills.  He has no nausea, vomiting, diarrhea or constipation.  He has no headache or visual changes.  He has been tolerating his treatment with maintenance Avastin fairly well.  The patient had repeat CT scan of the chest performed recently and he is here for evaluation and discussion of his scan results.   MEDICAL HISTORY: Past Medical History:  Diagnosis Date   Arthritis    hands   Enlarged prostate    takes Terazosin daily   Gastroparesis    GERD (gastroesophageal reflux disease)    occasionally but no meds required   H/O cardiovascular stress test 10/2019   nuclear, normal, Dr. Fransico Him   History of colon polyps    History of gastric ulcer    Joint pain    knees and ankles   Urinary frequency     ALLERGIES:  has No Known  Allergies.  MEDICATIONS:  Current Outpatient Medications  Medication Sig Dispense Refill   acetaminophen (TYLENOL) 500 MG tablet Take 2 tablets (1,000 mg total) by mouth every 6 (six) hours as needed. 30 tablet 0   albuterol (VENTOLIN HFA) 108 (90 Base) MCG/ACT inhaler Inhale 2 puffs into the lungs every 6 (six) hours as needed for wheezing or shortness of breath. 8 g 0   aspirin 81 MG EC tablet Take 81 mg by mouth daily.     atorvastatin (LIPITOR) 80 MG tablet TAKE 1 TABLET BY MOUTH EVERY DAY 90 tablet 0   dexamethasone (DECADRON) 4 MG tablet TAKE 1 TABLET BY MOUTH TWICE A DAY THE DAY BEFORE DAY OF ,AND DAY AFTER CHEMO EVERY 3 WEEKS 40 tablet 1   Fluticasone-Umeclidin-Vilant (TRELEGY ELLIPTA) 100-62.5-25 MCG/INH AEPB Inhale 1 puff into the lungs daily. 28 each 5   folic acid (FOLVITE) 1 MG tablet TAKE 1 TABLET BY MOUTH EVERY DAY 90 tablet 1   glucosamine-chondroitin 500-400 MG tablet Take 1 tablet by mouth daily.     L-Lysine 1000 MG TABS Take 1-2 tablets by mouth as needed (Daily as needed).     lidocaine-prilocaine (EMLA) cream Apply to the Port-A-Cath site 30-60-minute before chemotherapy. 30 g 0   magic mouthwash SOLN Take 5 mLs by mouth 3 (three) times daily as needed for mouth pain. 240 mL 0   omeprazole (PRILOSEC) 20 MG capsule Take 20 mg by mouth  daily.     oxyCODONE (OXY IR/ROXICODONE) 5 MG immediate release tablet Take 1-2 tablets (5-10 mg total) by mouth every 4 (four) hours as needed for moderate pain. 30 tablet 0   phenylephrine (NEO-SYNEPHRINE) 1 % nasal spray Place 1 drop into both nostrils at bedtime as needed for congestion.     prochlorperazine (COMPAZINE) 10 MG tablet TAKE 1 TABLET BY MOUTH EVERY 6 HOURS AS NEEDED FOR NAUSEA OR VOMITING. 30 tablet 0   terazosin (HYTRIN) 10 MG capsule TAKE 1 CAPSULE BY MOUTH EVERY DAY 90 capsule 0   Turmeric (QC TUMERIC COMPLEX PO) Take 450 mg by mouth daily.     No current facility-administered medications for this visit.    SURGICAL  HISTORY:  Past Surgical History:  Procedure Laterality Date   CHOLECYSTECTOMY N/A 11/19/2013   Procedure: LAPAROSCOPIC CHOLECYSTECTOMY;  Surgeon: Harl Bowie, MD;  Location: [place];  Service: General;  Laterality: N/A;   COLONOSCOPY     DIAGNOSTIC LAPAROSCOPY     lap chole   ESOPHAGOGASTRODUODENOSCOPY     IR IMAGING GUIDED PORT INSERTION  03/11/2021   skin spots removed and tested     done in the office   THORACENTESIS N/A 01/14/2021   Procedure: THORACENTESIS;  Surgeon: Collene Gobble, MD;  Location: [place];  Service: Cardiopulmonary;  Laterality: N/A;   THORACENTESIS N/A 01/23/2021   Procedure: THORACENTESIS;  Surgeon: Collene Gobble, MD;  Location: [facility] ENDOSCOPY;  Service: Cardiopulmonary;  Laterality: N/A;   VIDEO ASSISTED THORACOSCOPY (VATS) W/TALC PLEUADESIS Right 02/27/2021   Procedure: VIDEO ASSISTED THORACOSCOPY (VATS) WITH  TALC PLEUADESIS and PLEURAL BIOPSY;  Surgeon: Melrose Nakayama, MD;  Location: [facility] OR;  Service: Thoracic;  Laterality: Right;    REVIEW OF SYSTEMS:  Constitutional: positive for fatigue Eyes: negative Ears, nose, mouth, throat, and face: negative Respiratory: positive for dyspnea on exertion Cardiovascular: negative Gastrointestinal: negative Genitourinary:negative Integument/breast: negative Hematologic/lymphatic: negative Musculoskeletal:negative Neurological: negative Behavioral/Psych: negative Endocrine: negative Allergic/Immunologic: negative   PHYSICAL EXAMINATION: General appearance: alert, cooperative, fatigued, and no distress Head: Normocephalic, without obvious abnormality, atraumatic Neck: no adenopathy, no JVD, supple, symmetrical, trachea midline, and thyroid not enlarged, symmetric, no tenderness/mass/nodules Lymph nodes: Cervical, supraclavicular, and axillary nodes normal. Resp: clear to auscultation bilaterally Back: symmetric, no curvature. ROM normal. No CVA tenderness. Cardio: regular rate and rhythm, S1, S2  normal, no murmur, click, rub or gallop GI: soft, non-tender; bowel sounds normal; no masses,  no organomegaly Extremities: extremities normal, atraumatic, no cyanosis or edema Neurologic: Alert and oriented X 3, normal strength and tone. Normal symmetric reflexes. Normal coordination and gait  ECOG PERFORMANCE STATUS: 1 - Symptomatic but completely ambulatory  Blood pressure (!) 149/82, pulse 88, temperature (!) 97.2 F (36.2 C), temperature source Tympanic, resp. rate 19, height 5\' 8"  (1.727 m), weight 230 lb 9.6 oz (104.6 kg), SpO2 96 %.  LABORATORY DATA: Lab Results  Component Value Date   WBC 11.2 (H) 09/09/2021   HGB 13.2 09/09/2021   HCT 38.2 (L) 09/09/2021   MCV 97.9 09/09/2021   PLT 169 09/09/2021      Chemistry      Component Value Date/Time   NA 136 09/09/2021 1354   NA 141 10/20/2020 1030   K 4.0 09/09/2021 1354   CL 102 09/09/2021 1354   CO2 25 09/09/2021 1354   BUN 18 09/09/2021 1354   BUN 13 10/20/2020 1030   CREATININE 0.95 09/09/2021 1354   CREATININE 1.03 10/18/2016 1027      Component Value  Date/Time   CALCIUM 9.7 09/09/2021 1354   ALKPHOS 78 09/09/2021 1354   AST 18 09/09/2021 1354   ALT 15 09/09/2021 1354   BILITOT 0.6 09/09/2021 1354       RADIOGRAPHIC STUDIES: CT Chest W Contrast  Result Date: 09/27/2021 CLINICAL DATA:  Pleural effusion, mesothelioma EXAM: CT CHEST WITH CONTRAST TECHNIQUE: Multidetector CT imaging of the chest was performed during intravenous contrast administration. RADIATION DOSE REDUCTION: This exam was performed according to the departmental dose-optimization program which includes automated exposure control, adjustment of the mA and/or kV according to patient size and/or use of iterative reconstruction technique. CONTRAST:  50mL OMNIPAQUE IOHEXOL 300 MG/ML  SOLN COMPARISON:  07/23/2021 FINDINGS: Cardiovascular: Extensive multi-vessel coronary artery calcification. Global cardiac size within normal limits. No pericardial  effusion. Central pulmonary arteries are mildly enlarged in keeping with changes of pulmonary arterial hypertension. Mild atherosclerotic calcification within the thoracic aorta. No aortic aneurysm. Right internal jugular chest port is in place with its tip within the right atrium. Mediastinum/Nodes: Visualized thyroid is unremarkable. No pathologic thoracic adenopathy. The esophagus is unremarkable. Small hiatal hernia noted. Lungs/Pleura: Moderate emphysema again noted. Small complex right pleural effusion demonstrating pleural calcification is again seen and is unchanged. Right-sided volume loss is stable. There is peritoneal studding again identified diffusely in keeping with the given history of mesothelioma. Since the prior examination, peritoneal studding has progressed. Index nodule within the a right paramediastinal pleural surface at the level of the aortic arch, axial image # 47/2, is stable at 12 mm. However, a paramediastinal nodule adjacent to the left atrium at axial image # 90/2 measures 16 mm, previously 8 mm. Left lung is clear. Upper Abdomen: Status post cholecystectomy. Multiple cyst noted within the visualized liver. No acute abnormality. Musculoskeletal: No lytic or blastic bone lesion. IMPRESSION: Interval disease progression with progressive peritoneal studding. Index nodules as outlined above. Stable small right pleural effusion and right-sided volume loss. Moderate emphysema. Extensive multi-vessel coronary artery calcification Aortic Atherosclerosis (ICD10-I70.0) and Emphysema (ICD10-J43.9). Electronically Signed   By: Fidela Salisbury M.D.   On: 09/27/2021 03:28    ASSESSMENT AND PLAN: This is a very pleasant 71 years old white male recently diagnosed with unresectable stage IB (T2, N0, M0) malignant pleural mesothelioma, epithelioid type diagnosed and June 2022 and presented with extensive involvement of the right hemithorax. The patient is status post VATS with pleural biopsies and  talc pleurodesis in June 2022 under the care of Dr. Roxan Hockey. The patient started systemic chemotherapy with carboplatin for AUC of 5, Alimta 500 Mg/M2 and Avastin 15 Mg/KG every 3 weeks.  First dose March 23, 2021.  Status post 9 cycles.  Starting from cycle #7 the patient is on maintenance treatment with single agent Avastin every 3 weeks.   He has been tolerating his treatment with systemic chemotherapy and Avastin fairly well. He had repeat CT scan of the chest performed recently.  I personally and independently reviewed the scan images and discussed the result and showed the images to the patient and his wife. Unfortunately his scan showed interval disease progression with progressive pleural studding. I recommended for the patient to discontinue his treatment with Avastin at this point. I discussed with the patient other treatment options for his condition including treatment with immunotherapy with ipilimumab 1 Mg/KG every 6 weeks in addition to nivolumab 350 mg IV every 3 weeks until disease progression or unacceptable toxicity versus second line systemic chemotherapy with single agent gemcitabine versus palliative care on hospice referral. After discussion  of all the options the patient is interested in proceeding with the immunotherapy. I discussed with him the adverse effect of this treatment including but not limited to immunotherapy mediated skin rash, diarrhea, inflammation of the lung, kidney, liver, thyroid or other endocrine dysfunction. The patient is expected to start the first cycle of this treatment next week. I will see him back for follow-up visit in 4 weeks for evaluation with the start of day #22 of cycle #1. The patient was advised to call immediately if he has any other concerning symptoms in the interval. The patient voices understanding of current disease status and treatment options and is in agreement with the current care plan.  All questions were answered. The patient  knows to call the clinic with any problems, questions or concerns. We can certainly see the patient much sooner if necessary.  Disclaimer: This note was dictated with voice recognition software. Similar sounding words can inadvertently be transcribed and may not be corrected upon review.

## 2021-09-28 NOTE — Progress Notes (Signed)
Oncology Nurse Navigator Documentation  Oncology Nurse Navigator Flowsheets 09/28/2021  Navigator Location [facility]  Navigator Encounter Type Clinic/[facility]C  Patient Visit Type MedOnc/I was able to speak to Mr. And [name] today at his visit with Dr. Julien Nordmann. Patient is having a treatment plan change and I help to explain plan of care change. I uploaded new IO therapy medications to patient AVS to help explain more.   Treatment Phase Other  Barriers/Navigation Needs Education  Education Other  Interventions Education;Psycho-Social Support  Acuity Level 2-Minimal Needs (1-2 Barriers Identified)  Education Method Verbal;Other  Time Spent with Patient 30

## 2021-09-30 ENCOUNTER — Telehealth: Payer: Self-pay | Admitting: Internal Medicine

## 2021-09-30 NOTE — Telephone Encounter (Signed)
Scheduled per 01/23 los, patient has been called and voicemail was left.

## 2021-10-07 ENCOUNTER — Other Ambulatory Visit: Payer: Self-pay

## 2021-10-07 ENCOUNTER — Inpatient Hospital Stay: Payer: Medicare HMO | Attending: Internal Medicine

## 2021-10-07 ENCOUNTER — Inpatient Hospital Stay: Payer: Medicare HMO

## 2021-10-07 ENCOUNTER — Telehealth: Payer: Self-pay | Admitting: Internal Medicine

## 2021-10-07 VITALS — BP 125/71 | HR 73 | Temp 98.2°F | Resp 16 | Wt 231.2 lb

## 2021-10-07 DIAGNOSIS — Z79899 Other long term (current) drug therapy: Secondary | ICD-10-CM | POA: Insufficient documentation

## 2021-10-07 DIAGNOSIS — Z8719 Personal history of other diseases of the digestive system: Secondary | ICD-10-CM | POA: Insufficient documentation

## 2021-10-07 DIAGNOSIS — Z5112 Encounter for antineoplastic immunotherapy: Secondary | ICD-10-CM | POA: Insufficient documentation

## 2021-10-07 DIAGNOSIS — Z8711 Personal history of peptic ulcer disease: Secondary | ICD-10-CM | POA: Insufficient documentation

## 2021-10-07 DIAGNOSIS — K219 Gastro-esophageal reflux disease without esophagitis: Secondary | ICD-10-CM | POA: Diagnosis not present

## 2021-10-07 DIAGNOSIS — C450 Mesothelioma of pleura: Secondary | ICD-10-CM | POA: Diagnosis not present

## 2021-10-07 DIAGNOSIS — N400 Enlarged prostate without lower urinary tract symptoms: Secondary | ICD-10-CM | POA: Insufficient documentation

## 2021-10-07 DIAGNOSIS — K3184 Gastroparesis: Secondary | ICD-10-CM | POA: Diagnosis not present

## 2021-10-07 DIAGNOSIS — Z7982 Long term (current) use of aspirin: Secondary | ICD-10-CM | POA: Diagnosis not present

## 2021-10-07 DIAGNOSIS — Z95828 Presence of other vascular implants and grafts: Secondary | ICD-10-CM

## 2021-10-07 LAB — CBC WITH DIFFERENTIAL (CANCER CENTER ONLY)
Abs Immature Granulocytes: 0.02 10*3/uL (ref 0.00–0.07)
Basophils Absolute: 0 10*3/uL (ref 0.0–0.1)
Basophils Relative: 0 %
Eosinophils Absolute: 0.2 10*3/uL (ref 0.0–0.5)
Eosinophils Relative: 3 %
HCT: 38.2 % — ABNORMAL LOW (ref 39.0–52.0)
Hemoglobin: 13 g/dL (ref 13.0–17.0)
Immature Granulocytes: 0 %
Lymphocytes Relative: 22 %
Lymphs Abs: 1.4 10*3/uL (ref 0.7–4.0)
MCH: 32.7 pg (ref 26.0–34.0)
MCHC: 34 g/dL (ref 30.0–36.0)
MCV: 96 fL (ref 80.0–100.0)
Monocytes Absolute: 0.7 10*3/uL (ref 0.1–1.0)
Monocytes Relative: 12 %
Neutro Abs: 4 10*3/uL (ref 1.7–7.7)
Neutrophils Relative %: 63 %
Platelet Count: 156 10*3/uL (ref 150–400)
RBC: 3.98 MIL/uL — ABNORMAL LOW (ref 4.22–5.81)
RDW: 13.3 % (ref 11.5–15.5)
WBC Count: 6.4 10*3/uL (ref 4.0–10.5)
nRBC: 0 % (ref 0.0–0.2)

## 2021-10-07 LAB — CMP (CANCER CENTER ONLY)
ALT: 13 U/L (ref 0–44)
AST: 12 U/L — ABNORMAL LOW (ref 15–41)
Albumin: 3.6 g/dL (ref 3.5–5.0)
Alkaline Phosphatase: 87 U/L (ref 38–126)
Anion gap: 4 — ABNORMAL LOW (ref 5–15)
BUN: 9 mg/dL (ref 8–23)
CO2: 32 mmol/L (ref 22–32)
Calcium: 9.3 mg/dL (ref 8.9–10.3)
Chloride: 103 mmol/L (ref 98–111)
Creatinine: 0.84 mg/dL (ref 0.61–1.24)
GFR, Estimated: >60 mL/min (ref >60)
Glucose, Bld: 119 mg/dL — ABNORMAL HIGH (ref 70–99)
Potassium: 3.6 mmol/L (ref 3.5–5.1)
Sodium: 139 mmol/L (ref 135–145)
Total Bilirubin: 0.6 mg/dL (ref 0.3–1.2)
Total Protein: 6.5 g/dL (ref 6.5–8.1)

## 2021-10-07 LAB — TSH: TSH: 1.881 u[IU]/mL (ref 0.320–4.118)

## 2021-10-07 MED ORDER — HEPARIN SOD (PORK) LOCK FLUSH 100 UNIT/ML IV SOLN
500.0000 [IU] | Freq: Once | INTRAVENOUS | Status: AC | PRN
  Administered 2021-10-07: 500 [IU]

## 2021-10-07 MED ORDER — SODIUM CHLORIDE 0.9 % IV SOLN
360.0000 mg | Freq: Once | INTRAVENOUS | Status: AC
  Administered 2021-10-07: 360 mg via INTRAVENOUS
  Filled 2021-10-07: qty 24

## 2021-10-07 MED ORDER — DIPHENHYDRAMINE HCL 50 MG/ML IJ SOLN
25.0000 mg | Freq: Once | INTRAMUSCULAR | Status: AC
  Administered 2021-10-07: 25 mg via INTRAVENOUS
  Filled 2021-10-07: qty 1

## 2021-10-07 MED ORDER — SODIUM CHLORIDE 0.9 % IV SOLN: Freq: Once | INTRAVENOUS | Status: AC

## 2021-10-07 MED ORDER — FAMOTIDINE IN NACL 20-0.9 MG/50ML-% IV SOLN
20.0000 mg | Freq: Once | INTRAVENOUS | Status: AC
  Administered 2021-10-07: 20 mg via INTRAVENOUS
  Filled 2021-10-07: qty 50

## 2021-10-07 MED ORDER — SODIUM CHLORIDE 0.9 % IV SOLN
1.0000 mg/kg | Freq: Once | INTRAVENOUS | Status: AC
  Administered 2021-10-07: 100 mg via INTRAVENOUS
  Filled 2021-10-07: qty 20

## 2021-10-07 MED ORDER — SODIUM CHLORIDE 0.9% FLUSH
10.0000 mL | Freq: Once | INTRAVENOUS | Status: AC
  Administered 2021-10-07: 10 mL

## 2021-10-07 MED ORDER — SODIUM CHLORIDE 0.9% FLUSH
10.0000 mL | INTRAVENOUS | Status: DC | PRN
  Administered 2021-10-07: 10 mL

## 2021-10-07 NOTE — Patient Instructions (Addendum)
[facility]  Discharge Instructions: Thank you for choosing [facility] to provide your oncology and hematology care.   If you have a lab appointment with the [facility], please go directly to the [facility] and check in at the registration area.   Wear comfortable clothing and clothing appropriate for easy access to any Portacath or PICC line.   We strive to give you quality time with your provider. You may need to reschedule your appointment if you arrive late (15 or more minutes).  Arriving late affects you and other patients whose appointments are after yours.  Also, if you miss three or more appointments without notifying the office, you may be dismissed from the clinic at the providers discretion.      For prescription refill requests, have your pharmacy contact our office and allow 72 hours for refills to be completed.    Today you received the following chemotherapy and/or immunotherapy agents: Nivolumab and Ipilimumab      To help prevent nausea and vomiting after your treatment, we encourage you to take your nausea medication as directed.  BELOW ARE SYMPTOMS THAT SHOULD BE REPORTED IMMEDIATELY: *FEVER GREATER THAN 100.4 F (38 C) OR HIGHER *CHILLS OR SWEATING *NAUSEA AND VOMITING THAT IS NOT CONTROLLED WITH YOUR NAUSEA MEDICATION *UNUSUAL SHORTNESS OF BREATH *UNUSUAL BRUISING OR BLEEDING *URINARY PROBLEMS (pain or burning when urinating, or frequent urination) *BOWEL PROBLEMS (unusual diarrhea, constipation, pain near the anus) TENDERNESS IN MOUTH AND THROAT WITH OR WITHOUT PRESENCE OF ULCERS (sore throat, sores in mouth, or a toothache) UNUSUAL RASH, SWELLING OR PAIN  UNUSUAL VAGINAL DISCHARGE OR ITCHING   Items with * indicate a potential emergency and should be followed up as soon as possible or go to the Emergency Department if any problems should occur.  Please show the CHEMOTHERAPY ALERT CARD or IMMUNOTHERAPY ALERT CARD  at check-in to the Emergency Department and triage nurse.  Should you have questions after your visit or need to cancel or reschedule your appointment, please contact [facility]  Dept: [phone]  and follow the prompts.  Office hours are 8:00 a.m. to 4:30 p.m. Monday - Friday. Please note that voicemails left after 4:00 p.m. may not be returned until the following business day.  We are closed weekends and major holidays. You have access to a nurse at all times for urgent questions. Please call the main number to the clinic Dept: [phone] and follow the prompts.   For any non-urgent questions, you may also contact your provider using MyChart. We now offer e-Visits for anyone 72 and older to request care online for non-urgent symptoms. For details visit [email or web address].   Also download the MyChart app! Go to the app store, search "MyChart", open the app, select [facility], and log in with your MyChart username and password.  Due to Covid, a mask is required upon entering the hospital/clinic. If you do not have a mask, one will be given to you upon arrival. For doctor visits, patients may have 1 support Guy Evans aged 72 or older with them. For treatment visits, patients cannot have anyone with them due to current Covid guidelines and our immunocompromised population.   Nivolumab injection What is this medication? NIVOLUMAB (nye VOL ue mab) is a monoclonal antibody. It treats certain types of cancer. Some of the cancers treated are colon cancer, head and neck cancer, Hodgkin lymphoma, lung cancer, and melanoma. This medicine may be used for other purposes;  ask your health care provider or pharmacist if you have questions. COMMON BRAND NAME(S): Opdivo What should I tell my care team before I take this medication? They need to know if you have any of these conditions: Autoimmune diseases such as Crohn's disease, ulcerative colitis, or lupus Have had or  planning to have an allogeneic stem cell transplant (uses someone else's stem cells) History of chest radiation Organ transplant Nervous system problems such as myasthenia gravis or Guillain-Barre syndrome An unusual or allergic reaction to nivolumab, other medicines, foods, dyes, or preservatives Pregnant or trying to get pregnant Breast-feeding How should I use this medication? This medication is injected into a vein. It is given in a hospital or clinic setting. A special MedGuide will be given to you before each treatment. Be sure to read this information carefully each time. Talk to your care team regarding the use of this medication in children. While it may be prescribed for children as young as 12 years for selected conditions, precautions do apply. Overdosage: If you think you have taken too much of this medicine contact a poison control center or emergency room at once. NOTE: This medicine is only for you. Do not share this medicine with others. What if I miss a dose? Keep appointments for follow-up doses. It is important not to miss your dose. Call your care team if you are unable to keep an appointment. What may interact with this medication? Interactions have not been studied. This list may not describe all possible interactions. Give your health care provider a list of all the medicines, herbs, non-prescription drugs, or dietary supplements you use. Also tell them if you smoke, drink alcohol, or use illegal drugs. Some items may interact with your medicine. What should I watch for while using this medication? Your condition will be monitored carefully while you are receiving this medication. You may need blood work done while you are taking this medication. Do not become pregnant while taking this medication or for 5 months after stopping it. Women should inform their care team if they wish to become pregnant or think they might be pregnant. There is a potential for serious harm to  an unborn child. Talk to your care team for more information. Do not breast-feed an infant while taking this medication or for 5 months after stopping it. What side effects may I notice from receiving this medication? Side effects that you should report to your care team as soon as possible: Allergic reactions--skin rash, itching, hives, swelling of the face, lips, tongue, or throat Bloody or black, tar-like stools Change in vision Chest pain Diarrhea Dry cough, shortness of breath or trouble breathing Eye pain Fast or irregular heartbeat Fever, chills High blood sugar (hyperglycemia)--increased thirst or amount of urine, unusual weakness or fatigue, blurry vision High thyroid levels (hyperthyroidism)--fast or irregular heartbeat, weight loss, excessive sweating or sensitivity to heat, tremors or shaking, anxiety, nervousness, irregular menstrual cycle or spotting Kidney injury--decrease in the amount of urine, swelling of the ankles, hands, or feet Liver injury--right upper belly pain, loss of appetite, nausea, light-colored stool, dark yellow or brown urine, yellowing skin or eyes, unusual weakness or fatigue Low red blood cell count--unusual weakness or fatigue, dizziness, headache, trouble breathing Low thyroid levels (hypothyroidism)--unusual weakness or fatigue, increased sensitivity to cold, constipation, hair loss, dry skin, weight gain, feelings of depression Mood and behavior changes-confusion, change in sex drive or performance, irritability Muscle pain or cramps Pain, tingling, or numbness in the hands or feet, muscle  weakness, trouble walking, loss of balance or coordination Red or dark brown urine Redness, blistering, peeling, or loosening of the skin, including inside the mouth Stomach pain Unusual bruising or bleeding Side effects that usually do not require medical attention (report to your care team if they continue or are bothersome): Bone pain Constipation Loss of  appetite Nausea Tiredness Vomiting This list may not describe all possible side effects. Call your doctor for medical advice about side effects. You may report side effects to FDA at 1-800-FDA-1088. Where should I keep my medication? This medication is given in a hospital or clinic and will not be stored at home. NOTE: This sheet is a summary. It may not cover all possible information. If you have questions about this medicine, talk to your doctor, pharmacist, or health care provider.  2022 Elsevier/Gold Standard (2021-05-12 00:00:00)  Ipilimumab injection What is this medication? IPILIMUMAB (IP i LIM ue mab) is a monoclonal antibody. It treats colorectal cancer, esophageal cancer, kidney cancer, liver cancer, lung cancer, melanoma, and mesothelioma. This medicine may be used for other purposes; ask your health care provider or pharmacist if you have questions. COMMON BRAND NAME(S): YERVOY What should I tell my care team before I take this medication? They need to know if you have any of these conditions: autoimmune diseases like Crohn's disease, ulcerative colitis, or lupus have had or planning to have an allogeneic stem cell transplant (uses someone else's stem cells) history of organ transplant nervous system problems like myasthenia gravis or Guillain-Barre syndrome an unusual or allergic reaction to ipilimumab, other medicines, foods, dyes, or preservatives pregnant or trying to get pregnant breast-feeding How should I use this medication? This medicine is for infusion into a vein. It is given by a health care professional in a hospital or clinic setting. A special MedGuide will be given to you before each treatment. Be sure to read this information carefully each time. Talk to your pediatrician regarding the use of this medicine in children. While this drug may be prescribed for children as young as 12 years for selected conditions, precautions do apply. Overdosage: If you think  you have taken too much of this medicine contact a poison control center or emergency room at once. NOTE: This medicine is only for you. Do not share this medicine with others. What if I miss a dose? It is important not to miss your dose. Call your doctor or health care professional if you are unable to keep an appointment. What may interact with this medication? Interactions are not expected. This list may not describe all possible interactions. Give your health care provider a list of all the medicines, herbs, non-prescription drugs, or dietary supplements you use. Also tell them if you smoke, drink alcohol, or use illegal drugs. Some items may interact with your medicine. What should I watch for while using this medication? Tell your doctor or healthcare professional if your symptoms do not start to get better or if they get worse. Do not become pregnant while taking this medicine or for 3 months after stopping it. Women should inform their doctor if they wish to become pregnant or think they might be pregnant. There is a potential for serious side effects to an unborn child. Talk to your health care professional or pharmacist for more information. Do not breast-feed an infant while taking this medicine or for 3 months after the last dose. Your condition will be monitored carefully while you are receiving this medicine. You may need blood work  done while you are taking this medicine. What side effects may I notice from receiving this medication? Side effects that you should report to your doctor or health care professional as soon as possible: allergic reactions like skin rash, itching or hives, swelling of the face, lips, or tongue black, tarry stools bloody or watery diarrhea changes in vision dizziness eye pain fast, irregular heartbeat feeling anxious feeling faint or lightheaded, falls nausea, vomiting pain, tingling, numbness in the hands or feet redness, blistering, peeling or  loosening of the skin, including inside the mouth signs and symptoms of liver injury like dark yellow or brown urine; general ill feeling or flu-like symptoms; light-colored stools; loss of appetite; nausea; right upper belly pain; unusually weak or tired; yellowing of the eyes or skin unusual bleeding or bruising Side effects that usually do not require medical attention (report to your doctor or health care professional if they continue or are bothersome): headache loss of appetite trouble sleeping This list may not describe all possible side effects. Call your doctor for medical advice about side effects. You may report side effects to FDA at 1-800-FDA-1088. Where should I keep my medication? This drug is given in a hospital or clinic and will not be stored at home. NOTE: This sheet is a summary. It may not cover all possible information. If you have questions about this medicine, talk to your doctor, pharmacist, or health care provider.  2022 Elsevier/Gold Standard (2021-05-12 00:00:00)

## 2021-10-07 NOTE — Progress Notes (Signed)

## 2021-10-07 NOTE — Telephone Encounter (Signed)
Scheduled per 01/23 los, patient has been called and notified of all upcoming appointments.

## 2021-10-08 ENCOUNTER — Telehealth: Payer: Self-pay

## 2021-10-08 NOTE — Telephone Encounter (Signed)
-----   Message from Wylene Men, RN sent at 10/07/2021  4:56 PM EST ----- Regarding: [place] Patient received 1st time Opdivo/Yervoy.  Tolerated well.  No s/s or c/o distress or discomfort.

## 2021-10-08 NOTE — Telephone Encounter (Signed)
Guy Evans states that he is doing well so far. Eating, drinking, and urinating without any issues. He knows to call the office if he has any questions for concerns at [phone].

## 2021-10-13 ENCOUNTER — Telehealth: Payer: Self-pay | Admitting: Medical

## 2021-10-13 NOTE — Chronic Care Management (AMB) (Signed)
°  Chronic Care Management   Note  10/13/2021 Name: Servando Kyllonen MRN: [id number] DOB: 13-Feb-1951  Desmund Elman is a 71 y.o. year old male who is a primary care patient of Caryl Ada. I reached out to Balinda Quails by phone today in response to a referral sent by Mr. Romyn Hedger's PCP, Tysinger, Camelia Eng, PA-C.   Mr. Rideaux was given information about Chronic Care Management services today including:  CCM service includes personalized support from designated clinical staff supervised by his physician, including individualized plan of care and coordination with other care providers 24/7 contact phone numbers for assistance for urgent and routine care needs. Service will only be billed when office clinical staff spend 20 minutes or more in a month to coordinate care. Only one practitioner may furnish and bill the service in a calendar month. The patient may stop CCM services at any time (effective at the end of the month) by phone call to the office staff.   Patient agreed to services and verbal consent obtained.   Follow up plan:   Tatjana Secretary/administrator

## 2021-10-14 ENCOUNTER — Telehealth: Payer: Self-pay | Admitting: Pharmacist

## 2021-10-14 ENCOUNTER — Other Ambulatory Visit: Payer: Self-pay | Admitting: Medical

## 2021-10-14 NOTE — Chronic Care Management (AMB) (Signed)
Chronic Care Management Pharmacy Assistant   Name: Guy Evans  MRN: [id number] DOB: 04/20/1951  Reason for Encounter: Chart review for initial encounter with Guy Evans Clinical Pharmacist on 10/15/21 at 9 am via phone call.   Conditions to be addressed/monitored: CAD, HLD, COPD, and BPH  Recent office visits:  01/21/21 Tysinger, Camelia Eng, PA-C - Patient presented for Hypoxia and other concerns. No medication changes.  Recent consult visits:  09/28/21 Curt Bears, MD (Oncology) - Patient presented for Mesothelioma of pleura and other concerns. Recommended for the patient to discontinue his treatment with Avastin.  09/26/21 Curt Bears, MD (Radiology) - Patient presented for CT Chest W/ Contrast.  09/09/21 Curt Bears, MD (Oncology) - Patient presented for Mesothelioma of pleura and other concerns. No medication changes.  08/17/21 Curt Bears, MD (Oncology) - Patient presented for Mesothelioma of pleura and other concerns. No medication changes.  07/27/21  Julien Nordmann, MD (Oncology) - Patient presented for Mesothelioma of pleura and other concerns. No medication changes.  07/06/21 Julien Nordmann, MD (Oncology) - Patient presented for Contact with and (suspected) exposure to asbestos and other concerns.No medication changes.  06/15/21  Julien Nordmann, MD (Oncology) - Patient presented for Mesothelioma of pleura and other concerns. No medication changes.  06/01/21 Patient presented to [facility] for [facility]  (No provider or other details available)  05/25/21 Heilingoetter, Cassandra L, PA-C (Oncology) - Patient presented for Mesothelioma of pleura and other concerns. No medication changes.  05/04/21  Julien Nordmann, MD (Oncology) - Patient presented for Contact with and (suspected) exposure to asbestos and other concerns.No medication changes.  Hospital visits:  Medication Reconciliation was completed by comparing discharge summary, patients EMR and Pharmacy list, and upon  discussion with patient.  Patient presented to [facility] ED on  04/27/21 due to Sore Throat. Patient was present for 1 hour  New?Medications Started at [facility] Discharge:?? -started  none  Medication Changes at Hospital Discharge: -Changed  none  Medications Discontinued at Hospital Discharge: -Stopped none  Medications that remain the same after Hospital Discharge:??  -All other medications will remain the same.    Medications: Outpatient Encounter Medications as of 10/14/2021  Medication Sig   acetaminophen (TYLENOL) 500 MG tablet Take 2 tablets (1,000 mg total) by mouth every 6 (six) hours as needed.   albuterol (VENTOLIN HFA) 108 (90 Base) MCG/ACT inhaler Inhale 2 puffs into the lungs every 6 (six) hours as needed for wheezing or shortness of breath.   aspirin 81 MG EC tablet Take 81 mg by mouth daily.   atorvastatin (LIPITOR) 80 MG tablet TAKE 1 TABLET BY MOUTH EVERY DAY   dexamethasone (DECADRON) 4 MG tablet TAKE 1 TABLET BY MOUTH TWICE A DAY THE DAY BEFORE DAY OF ,AND DAY AFTER CHEMO EVERY 3 WEEKS   Fluticasone-Umeclidin-Vilant (TRELEGY ELLIPTA) 100-62.5-25 MCG/INH AEPB Inhale 1 puff into the lungs daily.   folic acid (FOLVITE) 1 MG tablet TAKE 1 TABLET BY MOUTH EVERY DAY   glucosamine-chondroitin 500-400 MG tablet Take 1 tablet by mouth daily.   L-Lysine 1000 MG TABS Take 1-2 tablets by mouth as needed (Daily as needed).   lidocaine-prilocaine (EMLA) cream Apply to the Port-A-Cath site 30-60-minute before chemotherapy.   magic mouthwash SOLN Take 5 mLs by mouth 3 (three) times daily as needed for mouth pain.   omeprazole (PRILOSEC) 20 MG capsule Take 20 mg by mouth daily.   oxyCODONE (OXY IR/ROXICODONE) 5 MG immediate release tablet Take 1-2 tablets (5-10 mg total) by mouth every 4 (four)  hours as needed for moderate pain.   phenylephrine (NEO-SYNEPHRINE) 1 % nasal spray Place 1 drop into both nostrils at bedtime as needed for congestion.    prochlorperazine (COMPAZINE) 10 MG tablet TAKE 1 TABLET BY MOUTH EVERY 6 HOURS AS NEEDED FOR NAUSEA OR VOMITING.   terazosin (HYTRIN) 10 MG capsule TAKE 1 CAPSULE BY MOUTH EVERY DAY   Turmeric (QC TUMERIC COMPLEX PO) Take 450 mg by mouth daily.   No facility-administered encounter medications on file as of 10/14/2021.  Fill History :  acetaminophen (TYLENOL) 500 MG tablet 03/03/2021 4   ALBUTEROL SULFATE HFA HFA 200 INH 03/02/2021 25   ATORVASTATIN 80 MG TABLET 07/20/2021 90   DEXAMETHASONE 4 MG TABLET 07/27/2021 20   TRELEGY ELLIPTA 100-62.5-25 51/70/0174 30   FOLIC ACID 1 MG TABLET 94/49/6759 90   FOLIC ACID 1 MG TABLET 07/23/2021 90   LIDOCAINE-PRILOCAINE CREAM 06/15/2021 30   oxyCODONE (OXY IR/ROXICODONE) 5 MG immediate release tablet 03/03/2021 5   PROCHLORPERAZINE 10 MG TAB 06/12/2021 7   TERAZOSIN 10 MG CAPSULE 09/21/2021 90   Have you seen any other providers since your last visit? Patient reports not other than his oncology  Any changes in your medications or health? Patient reports yes with oncology  Any side effects from any medications? Patient reports he has been taking them for several years and does not notice anything  Do you have an symptoms or problems not managed by your medications? Patient reports none  Any concerns about your health right now? Patient reports none  Has your provider asked that you check blood pressure, blood sugar, or follow special diet at home? Patient reports he has a blood pressure cuff and is occassionally checking pressures when he remembers.  Do you get any type of exercise on a regular basis? Patient reports he helps take care of his wife  Can you think of a goal you would like to reach for your health? Patient reports none  Do you have any problems getting your medications? Patient reports he is happy with his current pharmacy and has no issues affording medications.  Is there anything that you would like to discuss during  the appointment? Patient reports none  Patient confirmed to call [phone] for call and confirmed time, advised he will take a blood pressure and have medications and supplements available as well.  Care Gaps: TDAP - Overdue COVID Booster - Overdue AWV- 2/22  Star Rating Drugs: Atorvastatin 80 mg - Last filled 07/20/21 90 DS at [facility] Pharmacist Assistant [phone]

## 2021-10-14 NOTE — Telephone Encounter (Signed)
Pt has an appt tomorrow was given #90 on 11/14 so he should not be out

## 2021-10-15 ENCOUNTER — Ambulatory Visit (INDEPENDENT_AMBULATORY_CARE_PROVIDER_SITE_OTHER): Payer: Medicare HMO | Admitting: Pharmacist

## 2021-10-15 DIAGNOSIS — E7800 Pure hypercholesterolemia, unspecified: Secondary | ICD-10-CM

## 2021-10-15 DIAGNOSIS — J441 Chronic obstructive pulmonary disease with (acute) exacerbation: Secondary | ICD-10-CM

## 2021-10-15 NOTE — Progress Notes (Signed)
Chronic Care Management Pharmacy Note  10/15/2021 Name:  Qunicy Higinbotham MRN:  [id number] DOB:  12-19-1950  Summary: LDL at goal < 70  Recommendations/Changes made from today's visit: -Recommended asking about stopping folic acid with oncologist -Recommended limiting using of Afrin and replacing with Flonase and to avoid rebound congestion  Plan: No follow up as patient is moving   Subjective: Ryen Rhames is an 71 y.o. year old male who is a primary patient of Carlena Hurl, PA-C.  The CCM team was consulted for assistance with disease management and care coordination needs.    Engaged with patient by telephone for initial visit in response to provider referral for pharmacy case management and/or care coordination services.   Consent to Services:  The patient was given the following information about Chronic Care Management services today, agreed to services, and gave verbal consent: 1. CCM service includes personalized support from designated clinical staff supervised by the primary care provider, including individualized plan of care and coordination with other care providers 2. 24/7 contact phone numbers for assistance for urgent and routine care needs. 3. Service will only be billed when office clinical staff spend 20 minutes or more in a month to coordinate care. 4. Only one practitioner may furnish and bill the service in a calendar month. 5.The patient may stop CCM services at any time (effective at the end of the month) by phone call to the office staff. 6. The patient will be responsible for cost sharing (co-pay) of up to 20% of the service fee (after annual deductible is met). Patient agreed to services and consent obtained.  Patient Care Team: Tysinger, Camelia Eng, PA-C as PCP - General (Family Medicine) Sueanne Margarita, MD as PCP - Cardiology (Cardiology) Valrie Hart, RN as Oncology Nurse Navigator (Oncology) Viona Gilmore, [facility] as Pharmacist (Pharmacist)  Recent  office visits: 01/21/21 Tysinger, Camelia Eng, PA-C - Patient presented for Hypoxia and other concerns. No medication changes.  Recent consult visits: 09/28/21 Curt Bears, MD (Oncology) - Patient presented for Mesothelioma of pleura and other concerns. Recommended for the patient to discontinue his treatment with Avastin.   09/26/21 Curt Bears, MD (Radiology) - Patient presented for CT Chest W/ Contrast.   09/09/21 Curt Bears, MD (Oncology) - Patient presented for Mesothelioma of pleura and other concerns. No medication changes.   08/17/21 Curt Bears, MD (Oncology) - Patient presented for Mesothelioma of pleura and other concerns. No medication changes.   07/27/21  Julien Nordmann, MD (Oncology) - Patient presented for Mesothelioma of pleura and other concerns. No medication changes.   07/06/21 Julien Nordmann, MD (Oncology) - Patient presented for Contact with and (suspected) exposure to asbestos and other concerns.No medication changes.   06/15/21  Julien Nordmann, MD (Oncology) - Patient presented for Mesothelioma of pleura and other concerns. No medication changes.   06/01/21 [facility] presented to [facility]  (No provider or other details available)   05/25/21 Heilingoetter, Cassandra L, PA-C (Oncology) - Patient presented for Mesothelioma of pleura and other concerns. No medication changes.   05/04/21  Julien Nordmann, MD (Oncology) - Patient presented for Contact with and (suspected) exposure to asbestos and other concerns.No medication changes.  Hospital visits: Medication Reconciliation was completed by comparing discharge summary, patients EMR and Pharmacy list, and upon discussion with patient.   Patient presented to [facility] ED on  04/27/21 due to Sore Throat. Patient was present for 1 hour   New?Medications Started at [facility] Discharge:?? -started  none  Medication Changes at Hospital Discharge: -Changed  none   Medications Discontinued  at Hospital Discharge: -Stopped none   Medications that remain the same after Hospital Discharge:??  -All other medications will remain the same.       Objective:  Lab Results  Component Value Date   CREATININE 0.84 10/07/2021   BUN 9 10/07/2021   GFRNONAA >60 10/07/2021   GFRAA 82 10/20/2020   NA 139 10/07/2021   K 3.6 10/07/2021   CALCIUM 9.3 10/07/2021   CO2 32 10/07/2021   GLUCOSE 119 (H) 10/07/2021    Lab Results  Component Value Date/Time   HGBA1C 5.4 04/04/2018 09:17 AM   HGBA1C 5.0 10/18/2016 10:27 AM    Last diabetic Eye exam: No results found for: HMDIABEYEEXA  Last diabetic Foot exam: No results found for: HMDIABFOOTEX   Lab Results  Component Value Date   CHOL 115 10/20/2020   HDL 46 10/20/2020   LDLCALC 55 10/20/2020   TRIG 67 10/20/2020   CHOLHDL 2.5 10/20/2020    Hepatic Function Latest Ref Rng & Units 10/07/2021 09/28/2021 09/09/2021  Total Protein 6.5 - 8.1 g/dL 6.5 7.7 7.6  Albumin 3.5 - 5.0 g/dL 3.6 3.9 4.2  AST 15 - 41 U/L 12(L) 19 18  ALT 0 - 44 U/L '13 17 15  ' Alk Phosphatase 38 - 126 U/L 87 79 78  Total Bilirubin 0.3 - 1.2 mg/dL 0.6 0.7 0.6  Bilirubin, Direct 0.0 - 0.2 mg/dL - - -    Lab Results  Component Value Date/Time   TSH 1.881 10/07/2021 01:17 PM    CBC Latest Ref Rng & Units 10/07/2021 09/28/2021 09/09/2021  WBC 4.0 - 10.5 K/uL 6.4 8.4 11.2(H)  Hemoglobin 13.0 - 17.0 g/dL 13.0 12.9(L) 13.2  Hematocrit 39.0 - 52.0 % 38.2(L) 37.5(L) 38.2(L)  Platelets 150 - 400 K/uL 156 166 169    No results found for: VD25OH  Clinical ASCVD: Yes  The ASCVD Risk score (Arnett DK, et al., 2019) failed to calculate for the following reasons:   The valid total cholesterol range is 130 to 320 mg/dL    Depression screen [facility] 2/9 10/20/2020 09/18/2019 04/03/2018  Decreased Interest 0 0 0  Down, Depressed, Hopeless 0 0 0  PHQ - 2 Score 0 0 0  Altered sleeping - - -  Tired, decreased energy - - -  Change in appetite - - -  Feeling bad or failure about  yourself  - - -  Trouble concentrating - - -  Moving slowly or fidgety/restless - - -  PHQ-9 Score - - -     Social History   Tobacco Use  Smoking Status Former   Packs/day: 2.00   Years: 30.00   Pack years: 60.00   Types: Cigarettes   Quit date: 1992   Years since quitting: 31.1  Smokeless Tobacco Never  Tobacco Comments   quit smoking around 1993   BP Readings from Last 3 Encounters:  10/07/21 125/71  09/28/21 (!) 149/82  09/09/21 (!) 143/68   Pulse Readings from Last 3 Encounters:  10/07/21 73  09/28/21 88  09/09/21 86   Wt Readings from Last 3 Encounters:  10/07/21 231 lb 4 oz (104.9 kg)  09/28/21 230 lb 9.6 oz (104.6 kg)  09/09/21 233 lb 4.8 oz (105.8 kg)   BMI Readings from Last 3 Encounters:  10/07/21 35.16 kg/m  09/28/21 35.06 kg/m  09/09/21 35.47 kg/m    Assessment/Interventions: Review of patient past medical history, allergies, medications, health status, including  review of consultants reports, laboratory and other test data, was performed as part of comprehensive evaluation and provision of chronic care management services.   SDOH:  (Social Determinants of Health) assessments and interventions performed: Yes SDOH Interventions    Flowsheet Row Most Recent Value  SDOH Interventions   Financial Strain Interventions Intervention Not Indicated  Transportation Interventions Intervention Not Indicated      SDOH Screenings   Alcohol Screen: Not on file  Depression (PHQ2-9): Low Risk    PHQ-2 Score: 0  Financial Resource Strain: Low Risk    Difficulty of Paying Living Expenses: Not hard at all  Food Insecurity: Not on file  Housing: Not on file  Physical Activity: Not on file  Social Connections: Not on file  Stress: Not on file  Tobacco Use: Medium Risk   Smoking Tobacco Use: Former   Smokeless Tobacco Use: Never   Passive Exposure: Not on file  Transportation Needs: No Transportation Needs   Lack of Transportation (Medical): No   Lack  of Transportation (Non-Medical): No   Patient usually gets up between 8-10am and fixes coffee and then eats half a bagel and then gets on the computer for a while. He pays the bills on the computer. Patient brings his wife to the doctor or himself almost every day. Patient and his wife are preparing to move again to [place] in a little over a month. They are renovating the basement of his son's house. This will be a big adjustment. Patient just set out to get some quotes from movers.   Patient has lived in [place] since 39 and moved from [place]. He lived in [place] and then went to [facility] state.  Patient denies any problems with his current medications right now. Patient denies any issues with the price of his medications other than magic mouthwash.   CCM Care Plan  No Known Allergies  Medications Reviewed Today     Reviewed by Wylene Men, RN (Registered Nurse) on 10/07/21 at 1420  Med List Status: <None>   Medication Order Taking? Sig Documenting Provider Last Dose Status Informant  acetaminophen (TYLENOL) 500 MG tablet [id number] No Take 2 tablets (1,000 mg total) by mouth every 6 (six) hours as needed. Barrett, Lodema Hong, PA-C Taking Active   albuterol (VENTOLIN HFA) 108 (90 Base) MCG/ACT inhaler [id number] No Inhale 2 puffs into the lungs every 6 (six) hours as needed for wheezing or shortness of breath. Girtha Rm, PA-C Taking Active   aspirin 81 MG EC tablet [id number] No Take 81 mg by mouth daily. [provider] Taking Active Self  atorvastatin (LIPITOR) 80 MG tablet [id number] No TAKE 1 TABLET BY MOUTH EVERY DAY Tysinger, Camelia Eng, PA-C Taking Active   dexamethasone (DECADRON) 4 MG tablet [id number] No TAKE 1 TABLET BY MOUTH TWICE A DAY THE DAY BEFORE DAY OF ,AND DAY AFTER CHEMO EVERY 3 WEEKS Mohamed, Mohamed, MD Taking Active   famotidine (PEPCID) IVPB 20 mg premix [id number]   Curt Bears, MD  Active   Fluticasone-Umeclidin-Vilant (TRELEGY ELLIPTA)  100-62.5-25 MCG/INH AEPB [id number] No Inhale 1 puff into the lungs daily. Tysinger, Camelia Eng, PA-C Taking Active   folic acid (FOLVITE) 1 MG tablet [id number] No TAKE 1 TABLET BY MOUTH EVERY DAY Curt Bears, MD Taking Active   glucosamine-chondroitin 500-400 MG tablet [id number] No Take 1 tablet by mouth daily. [provider] Taking Active Self  heparin lock flush 100 unit/mL [id number]   Curt Bears, MD  Active  ipilimumab (YERVOY) 100 mg in sodium chloride 0.9 % 50 mL chemo infusion [id number]   Curt Bears, MD  Active   L-Lysine 1000 MG TABS [id number] No Take 1-2 tablets by mouth as needed (Daily as needed). [provider] Taking Active Self  lidocaine-prilocaine (EMLA) cream [id number] No Apply to the Port-A-Cath site 30-60-minute before chemotherapy. Curt Bears, MD Taking Active   magic mouthwash SOLN [id number]  Take 5 mLs by mouth 3 (three) times daily as needed for mouth pain. Curt Bears, MD  Active   nivolumab (OPDIVO) 360 mg in sodium chloride 0.9 % 100 mL chemo infusion [id number]   Curt Bears, MD  Active   omeprazole (PRILOSEC) 20 MG capsule [id number] No Take 20 mg by mouth daily. [provider] Taking Active Self  oxyCODONE (OXY IR/ROXICODONE) 5 MG immediate release tablet [id number] No Take 1-2 tablets (5-10 mg total) by mouth every 4 (four) hours as needed for moderate pain. Barrett, Lodema Hong, PA-C Taking Active   phenylephrine (NEO-SYNEPHRINE) 1 % nasal spray [id number] No Place 1 drop into both nostrils at bedtime as needed for congestion. [provider] Taking Active Self  prochlorperazine (COMPAZINE) 10 MG tablet [id number] No TAKE 1 TABLET BY MOUTH EVERY 6 HOURS AS NEEDED FOR NAUSEA OR VOMITING. Heilingoetter, Cassandra L, PA-C Taking Active   sodium chloride flush (NS) 0.9 % injection 10 mL [id number]   Curt Bears, MD  Active   terazosin (HYTRIN) 10 MG capsule [id number]  TAKE 1 CAPSULE BY MOUTH EVERY DAY Tysinger,  Camelia Eng, PA-C  Active   Turmeric (QC TUMERIC COMPLEX PO) [id number] No Take 450 mg by mouth daily. 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   Encounter for antineoplastic chemotherapy 03/05/2021   S/P Mini Thoracotomy with drainage of pleural effusion, pleural biopsy, and talc pleurodesis 02/27/2021   Recurrent right pleural effusion 02/11/2021   Status post thoracentesis    Former smoker 01/21/2021   Dyspnea 01/13/2021   Abnormal lung field 01/13/2021   Hypoxia 01/13/2021   Acute respiratory failure with hypoxia (HCC)    Pleural effusion    SOB (shortness of breath) 01/06/2021   Pulmonary emphysema ([place]) 01/06/2021   COPD (chronic obstructive pulmonary disease) ([place]) 01/06/2021   Screening for diabetes mellitus 11/05/2020   History of skin cancer 11/05/2020   Gastroesophageal reflux disease 11/05/2020   Plantar fasciitis 11/05/2020   Encounter for screening for vascular disease 11/05/2020   BMI 37.0-37.9, adult 10/20/2020   Advanced directives, counseling/discussion 10/20/2020   Loss of height 10/20/2020   Screening for osteoporosis 10/20/2020   Aortic atherosclerosis ([place]) 10/20/2020   Abnormal CT lung screening 10/20/2020   Abnormal CT of the chest 10/20/2020   Coronary artery disease involving native heart without angina pectoris 10/20/2020   Need for influenza vaccination 07/19/2018   Colonoscopy refused 04/27/2018   Benign prostatic hyperplasia 04/03/2018   Hyperlipidemia 01/17/2017   Hemorrhoids 01/17/2017   Encounter for health maintenance examination in adult 10/18/2016   Medicare annual wellness visit, subsequent 10/18/2016   Barrett's esophagus without dysplasia 10/18/2016   Polyp of colon  10/18/2016   Vaccine counseling 10/18/2016   Screen  for colon cancer 10/18/2016   Family history of premature CAD 10/18/2016    Immunization History  Administered Date(s) Administered   Fluad Quad(high Dose 65+) 05/09/2019   Influenza, High Dose Seasonal PF 06/30/2016, 07/19/2018, 06/16/2020, 06/01/2021   Influenza-Unspecified 06/01/2021   PFIZER Comirnaty(Gray Top)Covid-19 Tri-Sucrose Vaccine 12/11/2020   PFIZER(Purple Top)SARS-COV-2 Vaccination 10/04/2019, 10/30/2019, 07/14/2020, 06/09/2021   Pneumococcal Polysaccharide-23 01/15/2021   Zoster Recombinat (Shingrix) 02/22/2020, 06/16/2020   Conditions to be addressed/monitored:  Hyperlipidemia, Coronary Artery Disease, GERD, COPD, and BPH  Care Plan : [place]  Updates made by Viona Gilmore, [place] since 10/15/2021 12:00 AM     Problem: Problem: Hyperlipidemia, Coronary Artery Disease, GERD, COPD, and BPH      Long-Range Goal: Patient-Specific Goal   Start Date: 10/15/2021  Expected End Date: 10/15/2022  This Visit's Progress: On track  Priority: High  Note:   Current Barriers:  Unable to independently monitor therapeutic efficacy  Pharmacist Clinical Goal(s):  Patient will achieve adherence to monitoring guidelines and medication adherence to achieve therapeutic efficacy through collaboration with PharmD and provider.   Interventions: 1:1 collaboration with Tysinger, Camelia Eng, PA-C regarding development and update of comprehensive plan of care as evidenced by provider attestation and co-signature Inter-disciplinary care team collaboration (see longitudinal plan of care) Comprehensive medication review performed; medication list updated in electronic medical record  Hyperlipidemia: (LDL goal < 70) -Controlled -Current treatment: Atorvastatin 80 mg 1 tablet daily -Medications previously tried: pravastatin (ineffective)  -Current dietary patterns: not a lot of fried foods -Current exercise habits:  unsteady -Educated on Cholesterol goals;  Benefits of statin for ASCVD risk reduction; Importance of limiting foods high in cholesterol; -Counseled on diet and exercise extensively Recommended to continue current medication  CAD (Goal: minimize symptoms) -Controlled -Current treatment  Atorvastatin 80 mg 1 tablet daily - Appropriate, Effective, Safe, Accessible Aspirin 81 mg 1 tablet daily - Appropriate, Effective, Safe, Accessible -Medications previously tried: none  -Counseled on diet and exercise extensively Recommended to continue current medication   COPD (Goal: control symptoms and prevent exacerbations) -Controlled -Current treatment  Albuterol HFA as needed -Medications previously tried: Trelegy (not needed)   -Gold Grade: Gold 3 (FEV1 30-49%) -Current COPD Classification:  A (low sx, <2 exacerbations/yr) -MMRC/CAT score: n/a -Pulmonary function testing: 01/06/21 -Exacerbations requiring treatment in last 6 months: none -Patient denies consistent use of maintenance inhaler -Frequency of rescue inhaler use: not using at all -Counseled on When to use rescue inhaler -Recommended to continue current medication  GERD/Barrett's esophagus (Goal: minimize symptoms) -Controlled -Current treatment  Omeprazole 20 mg 1 capsule daily - Appropriate, Effective, Safe, Accessible -Medications previously tried: none  -Counseled on non-pharmacologic management of symptoms such as elevating the head of your bed, avoiding eating 2-3 hours before bed, avoiding triggering foods such as acidic, spicy, or fatty foods, eating smaller meals, and wearing clothes that are loose around the waist.  BPH (Goal: minimize symptoms) -Controlled -Current treatment  Terazosin 10 mg 1 capsule daily - Appropriate, Effective, Safe, Accessible -Medications previously tried: none  -Recommended to continue current medication  Supportive care with chemotherapy (Goal: minimize side  effects) -Controlled -Current treatment  Oxycodone 5 mg 1-2 tablets every 4 hours as needed (only 1 tablet left)  Magic mouthwash as needed Prochlorperazine 10 mg 1 tablet every 6 hours as needed Emla cream apply to port Folic acid 1 mg 1 tablet daily -Medications previously tried: none -Recommended to continue current medication   Health Maintenance -Vaccine gaps: tetanus, COVID booster -Current  therapy:  L-lysine 1000 mg 1 tablet daily as needed for mouth ulcers Glucosamine-chondroitin 500-400 mg 1 tablet daily  Afrin 1 spray as needed Turmeric 450 mg daily Acetaminophen 500 mg 1 tablet as needed  Mushroom super blend -Educated on Cost vs benefit of each product must be carefully weighed by individual consumer -Patient is satisfied with current therapy and denies issues -Recommended to continue current medication  Patient Goals/Self-Care Activities Patient will:  - take medications as prescribed as evidenced by patient report and record review  Follow Up Plan: The patient has been provided with contact information for the care management team and has been advised to call with any health related questions or concerns.        Medication Assistance: None required.  Patient affirms current coverage meets needs.  Compliance/Adherence/Medication fill history: Care Gaps: Tetanus, COVID booster  Star-Rating Drugs: Atorvastatin 80 mg - [facility]  Patient's preferred [facility] is:  [id number] [place], [place] - [id number]: [phone]: [phone] [facility] [id number]: [phone]: [phone] Uses pill box? Yes - weekly (has a back up ready to roll) Pt endorses 99% compliance  We discussed: Current pharmacy is preferred with insurance plan and patient is satisfied with  pharmacy services Patient decided to: Continue current medication management strategy  Care Plan and Follow Up Patient Decision:  Patient requests no follow-up at this time.  Plan: The patient has been provided with contact information for the care management team and has been advised to call with any health related questions or concerns.   MJeni Salles PharmD, B[facility] [phone]

## 2021-10-15 NOTE — Patient Instructions (Addendum)
Hi Guy Evans,  It was great to get to meet you over the telephone! Below is a summary of some of the topics we discussed.   Don't forget to get your tetanus shot at the pharmacy like we discussed. Also, don't forget to ask your oncologist about the need for continuing on folic acid as you may not need it with the change to immunotherapy.  Please try to replace your oxymetazoline (Afrin) with Flonase for your congestion as continuous use of the Afrin can cause your congestion to worsen.   Please reach out to me if you have any questions or need anything!  Best, Maddie  [name], PharmD, [place] [phone]   Visit Information   Goals Addressed   None    Patient Care Plan: CCM Pharmacy Care Plan     Problem Identified: Problem: Hyperlipidemia, Coronary Artery Disease, GERD, COPD, and BPH      Long-Range Goal: Patient-Specific Goal   Start Date: 10/15/2021  Expected End Date: 10/15/2022  This Visit's Progress: On track  Priority: High  Note:   Current Barriers:  Unable to independently monitor therapeutic efficacy  Pharmacist Clinical Goal(s):  Patient will achieve adherence to monitoring guidelines and medication adherence to achieve therapeutic efficacy through collaboration with PharmD and provider.   Interventions: 1:1 collaboration with Tysinger, Camelia Eng, PA-C regarding development and update of comprehensive plan of care as evidenced by provider attestation and co-signature Inter-disciplinary care team collaboration (see longitudinal plan of care) Comprehensive medication review performed; medication list updated in electronic medical record  Hyperlipidemia: (LDL goal < 70) -Controlled -Current treatment: Atorvastatin 80 mg 1 tablet daily -Medications previously tried: pravastatin (ineffective)  -Current dietary patterns: not a lot of fried foods -Current exercise habits: unsteady -Educated on Cholesterol goals;   Benefits of statin for ASCVD risk reduction; Importance of limiting foods high in cholesterol; -Counseled on diet and exercise extensively Recommended to continue current medication  CAD (Goal: minimize symptoms) -Controlled -Current treatment  Atorvastatin 80 mg 1 tablet daily - Appropriate, Effective, Safe, Accessible Aspirin 81 mg 1 tablet daily - Appropriate, Effective, Safe, Accessible -Medications previously tried: none  -Counseled on diet and exercise extensively Recommended to continue current medication   COPD (Goal: control symptoms and prevent exacerbations) -Controlled -Current treatment  Albuterol HFA as needed -Medications previously tried: Trelegy (not needed)   -Gold Grade: Gold 3 (FEV1 30-49%) -Current COPD Classification:  A (low sx, <2 exacerbations/yr) -MMRC/CAT score: n/a -Pulmonary function testing: 01/06/21 -Exacerbations requiring treatment in last 6 months: none -Patient denies consistent use of maintenance inhaler -Frequency of rescue inhaler use: not using at all -Counseled on When to use rescue inhaler -Recommended to continue current medication  GERD/Barrett's esophagus (Goal: minimize symptoms) -Controlled -Current treatment  Omeprazole 20 mg 1 capsule daily - Appropriate, Effective, Safe, Accessible -Medications previously tried: none  -Counseled on non-pharmacologic management of symptoms such as elevating the head of your bed, avoiding eating 2-3 hours before bed, avoiding triggering foods such as acidic, spicy, or fatty foods, eating smaller meals, and wearing clothes that are loose around the waist.  BPH (Goal: minimize symptoms) -Controlled -Current treatment  Terazosin 10 mg 1 capsule daily - Appropriate, Effective, Safe, Accessible -Medications previously tried: none  -Recommended to continue current medication  Supportive care with chemotherapy (Goal: minimize side effects) -Controlled -Current treatment  Oxycodone 5 mg 1-2 tablets  every 4 hours as needed (only 1 tablet left)  Magic mouthwash as needed Prochlorperazine 10 mg 1 tablet every  6 hours as needed Emla cream apply to port Folic acid 1 mg 1 tablet daily -Medications previously tried: none -Recommended to continue current medication   Health Maintenance -Vaccine gaps: tetanus, COVID booster -Current therapy:  L-lysine 1000 mg 1 tablet daily as needed for mouth ulcers Glucosamine-chondroitin 500-400 mg 1 tablet daily  Afrin 1 spray as needed Turmeric 450 mg daily Acetaminophen 500 mg 1 tablet as needed  Mushroom super blend -Educated on Cost vs benefit of each product must be carefully weighed by individual consumer -Patient is satisfied with current therapy and denies issues -Recommended to continue current medication  Patient Goals/Self-Care Activities Patient will:  - take medications as prescribed as evidenced by patient report and record review  Follow Up Plan: The patient has been provided with contact information for the care management team and has been advised to call with any health related questions or concerns.       [name] was given information about Chronic Care Management services today including:  CCM service includes personalized support from designated clinical staff supervised by his physician, including individualized plan of care and coordination with other care providers 24/7 contact phone numbers for assistance for urgent and routine care needs. Standard insurance, coinsurance, copays and deductibles apply for chronic care management only during months in which we provide at least 20 minutes of these services. Most insurances cover these services at 100%, however patients may be responsible for any copay, coinsurance and/or deductible if applicable. This service may help you avoid the need for more expensive face-to-face services. Only one practitioner may furnish and bill the service in a calendar month. The patient may  stop CCM services at any time (effective at the end of the month) by phone call to the office staff.  Patient agreed to services and verbal consent obtained.   Patient verbalizes understanding of instructions and care plan provided today and agrees to view in [place]. Active MyChart status confirmed with patient.   Patient can call if he has any questions but is moving.   Viona Gilmore, [facility]

## 2021-10-19 ENCOUNTER — Inpatient Hospital Stay: Payer: Medicare HMO | Admitting: Internal Medicine

## 2021-10-19 ENCOUNTER — Inpatient Hospital Stay: Payer: Medicare HMO

## 2021-10-22 ENCOUNTER — Ambulatory Visit (INDEPENDENT_AMBULATORY_CARE_PROVIDER_SITE_OTHER): Payer: Medicare HMO | Admitting: Medical

## 2021-10-22 ENCOUNTER — Other Ambulatory Visit: Payer: Self-pay

## 2021-10-22 ENCOUNTER — Encounter: Payer: Self-pay | Admitting: Medical

## 2021-10-22 VITALS — BP 110/70 | HR 75 | Ht 68.5 in | Wt 227.2 lb

## 2021-10-22 DIAGNOSIS — K2270 Barrett's esophagus without dysplasia: Secondary | ICD-10-CM

## 2021-10-22 DIAGNOSIS — Z9889 Other specified postprocedural states: Secondary | ICD-10-CM

## 2021-10-22 DIAGNOSIS — N400 Enlarged prostate without lower urinary tract symptoms: Secondary | ICD-10-CM

## 2021-10-22 DIAGNOSIS — K219 Gastro-esophageal reflux disease without esophagitis: Secondary | ICD-10-CM

## 2021-10-22 DIAGNOSIS — C450 Mesothelioma of pleura: Secondary | ICD-10-CM | POA: Diagnosis not present

## 2021-10-22 DIAGNOSIS — R7301 Impaired fasting glucose: Secondary | ICD-10-CM

## 2021-10-22 DIAGNOSIS — Z85828 Personal history of other malignant neoplasm of skin: Secondary | ICD-10-CM | POA: Diagnosis not present

## 2021-10-22 DIAGNOSIS — I700 Atherosclerosis of aorta: Secondary | ICD-10-CM | POA: Diagnosis not present

## 2021-10-22 DIAGNOSIS — Z0000 Encounter for general adult medical examination without abnormal findings: Secondary | ICD-10-CM

## 2021-10-22 DIAGNOSIS — Z8249 Family history of ischemic heart disease and other diseases of the circulatory system: Secondary | ICD-10-CM

## 2021-10-22 DIAGNOSIS — Z95828 Presence of other vascular implants and grafts: Secondary | ICD-10-CM

## 2021-10-22 DIAGNOSIS — J439 Emphysema, unspecified: Secondary | ICD-10-CM

## 2021-10-22 DIAGNOSIS — I2510 Atherosclerotic heart disease of native coronary artery without angina pectoris: Secondary | ICD-10-CM

## 2021-10-22 DIAGNOSIS — Z87891 Personal history of nicotine dependence: Secondary | ICD-10-CM | POA: Diagnosis not present

## 2021-10-22 DIAGNOSIS — J449 Chronic obstructive pulmonary disease, unspecified: Secondary | ICD-10-CM

## 2021-10-22 DIAGNOSIS — E7800 Pure hypercholesterolemia, unspecified: Secondary | ICD-10-CM | POA: Diagnosis not present

## 2021-10-22 DIAGNOSIS — Z7185 Encounter for immunization safety counseling: Secondary | ICD-10-CM

## 2021-10-22 DIAGNOSIS — K635 Polyp of colon: Secondary | ICD-10-CM

## 2021-10-22 NOTE — Progress Notes (Signed)
Subjective:    Guy Evans is a 71 y.o. male who presents for Preventative Services visit and chronic medical problems/med check visit.    Primary Care Provider Chana Bode, PA-C here for primary care  Current Health Care Team: [facility], none Dr. Curt Bears, oncology Dr. Baltazar Apo, pulmonology Dr. Modesto Charon, cardiothoracic surgery Dr. Arta Silence, [facility] Dr. Fransico Him, cardiology [name], [facility], pharmacy for chronic care management Dermatology  Medical Services you may have received from other than [facility] providers in the past year (date may be approximate) Oncology- Dr. Selinda Flavin [facility]  Exercise Current exercise habits:  none    Nutrition/Diet Current diet:  no diet  Depression Screen Depression screen [facility] 2/9 10/22/2021  Decreased Interest 0  Down, Depressed, Hopeless 0  PHQ - 2 Score 0  Altered sleeping -  Tired, decreased energy -  Change in appetite -  Feeling bad or failure about yourself  -  Trouble concentrating -  Moving slowly or fidgety/restless -  PHQ-9 Score -    Activities of Daily Living Screen/Functional Status Survey Is the patient deaf or have difficulty hearing?: No Does the patient have difficulty seeing, even when wearing glasses/contacts?: No Does the patient have difficulty concentrating, remembering, or making decisions?: Yes Does the patient have difficulty walking or climbing stairs?: No Does the patient have difficulty dressing or bathing?: No Does the patient have difficulty doing errands alone such as visiting a doctor's office or shopping?: No  Can patient draw a clock face showing 3:15 oclock, yes  Fall Risk Screen Fall Risk  10/22/2021 10/20/2020 09/18/2019 04/03/2018 10/18/2016  Falls in the past year? 0 0 0 No No  Number falls in past yr: 0 - - - -  Injury with Fall? 0 - - - -  Risk for fall due to : No Fall Risks No Fall Risks -  - -  Follow up Falls evaluation completed Falls evaluation completed - - -    Gait Assessment: Normal gait observed yes  Advanced directives Does patient have a [place]? Yes Does patient have a Living Will? Yes  Past Medical History:  Diagnosis Date   Arthritis    hands   Enlarged prostate    takes Terazosin daily   Gastroparesis    GERD (gastroesophageal reflux disease)    occasionally but no meds required   H/O cardiovascular stress test 10/2019   nuclear, normal, Dr. Fransico Him   History of colon polyps    History of gastric ulcer    Joint pain    knees and ankles   Urinary frequency     Past Surgical History:  Procedure Laterality Date   CHOLECYSTECTOMY N/A 11/19/2013   Procedure: LAPAROSCOPIC CHOLECYSTECTOMY;  Surgeon: Harl Bowie, MD;  Location: [facility];  Service: General;  Laterality: N/A;   COLONOSCOPY     DIAGNOSTIC LAPAROSCOPY     lap chole   ESOPHAGOGASTRODUODENOSCOPY     IR IMAGING GUIDED PORT INSERTION  03/11/2021   skin spots removed and tested     done in the office   THORACENTESIS N/A 01/14/2021   Procedure: THORACENTESIS;  Surgeon: Collene Gobble, MD;  Location: [facility];  Service: Cardiopulmonary;  Laterality: N/A;   THORACENTESIS N/A 01/23/2021   Procedure: THORACENTESIS;  Surgeon: Collene Gobble, MD;  Location: [facility] ENDOSCOPY;  Service: Cardiopulmonary;  Laterality: N/A;   VIDEO ASSISTED THORACOSCOPY (VATS) W/TALC PLEUADESIS Right 02/27/2021   Procedure: VIDEO  ASSISTED THORACOSCOPY (VATS) WITH  TALC PLEUADESIS and PLEURAL BIOPSY;  Surgeon: Melrose Nakayama, MD;  Location: [facility] OR;  Service: Thoracic;  Laterality: Right;    Social History   Socioeconomic History   Marital status: Married    Spouse name: Not on file   Number of children: Not on file   Years of education: Not on file   Highest education level: Not on file  Occupational History   Not on file  Tobacco Use   Smoking status: Former    Packs/day:  2.00    Years: 30.00    Pack years: 60.00    Types: Cigarettes    Quit date: 38    Years since quitting: 31.1   Smokeless tobacco: Never   Tobacco comments:    quit smoking around 1993  Vaping Use   Vaping Use: Never used  Substance and Sexual Activity   Alcohol use: Yes    Alcohol/week: 12.0 standard drinks    Types: 6 Glasses of wine, 6 Shots of liquor per week    Comment: none since gallbladder issues   Drug use: Yes    Types: Marijuana   Sexual activity: Yes  Other Topics Concern   Not on file  Social History Narrative   Not exercising.  Married.   Has 2 children, 2 grandchildren, one child [place], [place], one in [place], [place].   Prior worked as Pension scheme manager.  10/2021   Social Determinants of Health   Financial Resource Strain: Low Risk    Difficulty of Paying Living Expenses: Not hard at all  Food Insecurity: Not on file  Transportation Needs: No Transportation Needs   Lack of Transportation (Medical): No   Lack of Transportation (Non-Medical): No  Physical Activity: Not on file  Stress: Not on file  Social Connections: Not on file  Intimate Partner Violence: Not on file    Family History  Problem Relation Age of Onset   Dementia Mother    Heart disease Father 49       MI at 66yo, angina   Obesity Father    Cancer Neg Hx    Stroke Neg Hx    Diabetes Neg Hx      Current Outpatient Medications:    acetaminophen (TYLENOL) 500 MG tablet, Take 2 tablets (1,000 mg total) by mouth every 6 (six) hours as needed., Disp: 30 tablet, Rfl: 0   aspirin 81 MG EC tablet, Take 81 mg by mouth daily., Disp: , Rfl:    atorvastatin (LIPITOR) 80 MG tablet, TAKE 1 TABLET BY MOUTH EVERY DAY, Disp: 90 tablet, Rfl: 0   folic acid (FOLVITE) 1 MG tablet, TAKE 1 TABLET BY MOUTH EVERY DAY, Disp: 90 tablet, Rfl: 1   glucosamine-chondroitin 500-400 MG tablet, Take 1 tablet by mouth daily., Disp: , Rfl:    L-Lysine 1000 MG TABS, Take 1-2 tablets by mouth as needed (Daily as  needed)., Disp: , Rfl:    lidocaine-prilocaine (EMLA) cream, Apply to the Port-A-Cath site 30-60-minute before chemotherapy., Disp: 30 g, Rfl: 0   magic mouthwash SOLN, Take 5 mLs by mouth 3 (three) times daily as needed for mouth pain., Disp: 240 mL, Rfl: 0   omeprazole (PRILOSEC) 20 MG capsule, Take 20 mg by mouth daily., Disp: , Rfl:    OVER THE COUNTER MEDICATION, Mushroom super blend (immunity and brain supplement) daily, Disp: , Rfl:    oxymetazoline (AFRIN) 0.05 % nasal spray, Place 1 spray into both nostrils as needed for congestion., Disp: ,  Rfl:    prochlorperazine (COMPAZINE) 10 MG tablet, TAKE 1 TABLET BY MOUTH EVERY 6 HOURS AS NEEDED FOR NAUSEA OR VOMITING., Disp: 30 tablet, Rfl: 0   terazosin (HYTRIN) 10 MG capsule, TAKE 1 CAPSULE BY MOUTH EVERY DAY, Disp: 90 capsule, Rfl: 0   Turmeric (QC TUMERIC COMPLEX PO), Take 450 mg by mouth daily., Disp: , Rfl:    albuterol (VENTOLIN HFA) 108 (90 Base) MCG/ACT inhaler, Inhale 2 puffs into the lungs every 6 (six) hours as needed for wheezing or shortness of breath. (Patient not taking: Reported on 10/22/2021), Disp: 8 g, Rfl: 0  No Known Allergies  History reviewed: allergies, current medications, past family history, past medical history, past social history, past surgical history and problem list  Chronic issues discussed: Been dealing with chemotherapy the past year regarding mesothelioma diagnosis   Acute issues discussed: none  Objective:      Biometrics BP 110/70    Pulse 75    Ht 5' 8.5" (1.74 m)    Wt 227 lb 3.2 oz (103.1 kg)    BMI 34.04 kg/m   Gen: wd, wn, nad, white male Skin: scattered macules, no specific worrisome lesions Chest and abdomen surgical scars, s/p thoracotomy and s/p lap choly HEENT: normocephalic, sclerae anicteric Neck: supple, no lymphadenopathy, no thyromegaly, no masses, no bruits Heart: RRR, normal S1, S2, no murmurs Lungs: left lower decreased lung sounds, otherwise CTA bilaterally, no wheezes,  rhonchi, or rales Abdomen: +bs, soft, non tender, non distended, no masses, no hepatomegaly, no splenomegaly Musculoskeletal: nontender, no swelling, no obvious deformity Extremities: no edema, no cyanosis, no clubbing Pulses: 2+ symmetric, upper and lower extremities, normal cap refill Neurological: alert, oriented x 3, CN2-12 intact, strength normal upper extremities and lower extremities, sensation normal throughout, DTRs 2+ throughout, no cerebellar signs, gait normal Psychiatric: normal affect, behavior normal, pleasant  GU/rectal - declined,deferred    Assessment:   Encounter Diagnoses  Name Primary?   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[place])    Pulmonary emphysema, unspecified emphysema type ([place])    S/P Mini Thoracotomy with drainage of pleural effusion, pleural biopsy, and talc pleurodesis    Medicare annual wellness visit, subsequent    Pure hypercholesterolemia    History of skin cancer    Gastroesophageal reflux disease, unspecified whether esophagitis present    Former smoker    Family history of premature CAD    Aortic atherosclerosis ([place])    Barrett's esophagus without dysplasia    Benign prostatic hyperplasia, unspecified whether lower urinary tract symptoms present    Chronic obstructive pulmonary disease, unspecified COPD type ([place])    Coronary artery disease involving native heart without angina pectoris, unspecified vessel or lesion type    Vaccine counseling    Port-A-Cath in place    Polyp of colon, unspecified part of colon, unspecified type    Impaired fasting glucose      Plan:    This visit was a preventative care visit, also known as wellness visit or routine physical.   Topics typically include healthy lifestyle, diet, exercise, preventative care, vaccinations, sick and well care, proper use of emergency dept and after hours care, as well as other concerns.     Recommendations: Continue to return  yearly for your annual wellness and preventative care visits.  This gives [place] a chance to discuss healthy lifestyle, exercise, vaccinations, review your chart record, and perform screenings where appropriate.  I recommend you see your eye doctor  yearly for routine vision care.  I recommend you see your dentist yearly for routine dental care including hygiene visits twice yearly.   Vaccination recommendations were reviewed Immunization History  Administered Date(s) Administered   Fluad Quad(high Dose 65+) 05/09/2019   Influenza, High Dose Seasonal PF 06/30/2016, 07/19/2018, 06/16/2020, 06/01/2021   Influenza-Unspecified 06/01/2021   PFIZER Comirnaty(Gray Top)Covid-19 Tri-Sucrose Vaccine 12/11/2020   PFIZER(Purple Top)SARS-COV-2 Vaccination 10/04/2019, 10/30/2019, 07/14/2020, 06/09/2021   Pneumococcal Polysaccharide-23 01/15/2021   Zoster Recombinat (Shingrix) 02/22/2020, 06/16/2020    Check with insurance about coverage for Td tetanus diptheria vaccine   Screening for cancer: Colon cancer screening: I reviewed your Cologuard that was negative 05/2021  We discussed PSA, prostate exam, and prostate cancer screening risks/benefits.   Given BPH, continue PSA monitoring.  Skin cancer screening: Check your skin regularly for new changes, growing lesions, or other lesions of concern Come in for evaluation if you have skin lesions of concern.  Lung cancer screening: If you have a greater than 20 pack year history of tobacco use, then you may qualify for lung cancer screening with a chest CT scan.   Please call your insurance company to inquire about coverage for this test.  We currently don't have screenings for other cancers besides breast, cervical, colon, and lung cancers.  If you have a strong family history of cancer or have other cancer screening concerns, please let me know.    Heart health: Get at least 150 minutes of aerobic exercise weekly Limit alcohol It is important to  maintain a healthy blood pressure and healthy cholesterol numbers  Heart disease screening: Screening for heart disease includes screening for blood pressure, fasting lipids, glucose/diabetes screening, BMI height to weight ratio, reviewed of smoking status, physical activity, and diet.    Goals include blood pressure 120/80 or less, maintaining a healthy lipid/cholesterol profile, preventing diabetes or keeping diabetes numbers under good control, not smoking or using tobacco products, exercising most days per week or at least 150 minutes per week of exercise, and eating healthy variety of fruits and vegetables, healthy oils, and avoiding unhealthy food choices like fried food, fast food, high sugar and high cholesterol foods.    Other tests may possibly include EKG test, CT coronary calcium score, echocardiogram, exercise treadmill stress test.   I reviewed your 10/2019 heart nuclear stress test that per cardiology was fine/WNL    Medical care options: I recommend you continue to seek care here first for routine care.  We try really hard to have available appointments Monday through Friday daytime hours for sick visits, acute visits, and physicals.  Urgent care should be used for after hours and weekends for significant issues that cannot wait till the next day.  The emergency department should be used for significant potentially life-threatening emergencies.  The emergency department is expensive, can often have long wait times for less significant concerns, so try to utilize primary care, urgent care, or telemedicine when possible to avoid unnecessary trips to the emergency department.  Virtual visits and telemedicine have been introduced since the pandemic started in 2020, and can be convenient ways to receive medical care.  We offer virtual appointments as well to assist you in a variety of options to seek medical care.    Separate significant issues discussed: Mesothelioma - reviewed recent  oncology notes.  He was on chemotherapy for most of the last 6 months and just transitioned to immunotherapy.    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   PRIOR THERAPY:  1) Status post right VATS with pleural biopsy and talc pleurodesis under the care of Dr. Roxan Hockey on 02/27/2021. 2) Systemic chemotherapy with carboplatin for AUC of 5, Alimta 500 Mg/M2 and Avastin 15 mg/KG every 3 weeks.  First dose March 23, 2021.  Status post 9 cycles.  Starting from cycle #7 he is on treatment with single agent Avastin.   CURRENT THERAPY: Second line treatment with immunotherapy with ipilimumab 1 Mg/KG every 3 weeks in addition to nivolumab 360 Mg IV every 3 weeks.  First dose October 08, 2021.   S/p thoracotomy 2022  Hypercholesteremia, aortic atherosclerosis CAD  - continue statin  GERD - no recent concerns  History of skin cancer - follow up with dermatology yearly  Barrett's esophagus - consider follow up with gastroenterology for surveillance, avoid alcohol  Enlarged prostate /BPH - no current symptoms of concern, PSA monitoring  COPD/emphysema on scan - no current dyspnea symptoms.  Was on trial of Trelegy last year and albuterol but after thoracotomy and beginning treatment for mesothelioma, he has not had any recent dyspnea.  Reviewed 03/2021 pulmonology notes  Wished him luck on his upcoming move to [place].  I reviewed his recent 10/15/21 chronic care management notes from pharmacist [name], [facility], and she recommended he replace Afrin with Flonase and recommmended he discontinue folic acid with oncology   He will have labs below drawn fasting at cancer center next week since nonfasting today  Perri was seen today for fasting cpe/awv.  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[place])  Pulmonary emphysema, unspecified emphysema type ([place])  S/P Mini Thoracotomy with drainage of pleural effusion, pleural biopsy, and talc pleurodesis  Medicare annual wellness visit, subsequent  Pure hypercholesterolemia -     Lipid panel; Future  History of skin cancer  Gastroesophageal reflux disease, unspecified whether esophagitis present  Former smoker  Family history of premature CAD  Aortic atherosclerosis ([place]) -     Lipid panel; Future  Barrett's esophagus without dysplasia  Benign prostatic hyperplasia, unspecified whether lower urinary tract symptoms present -     PSA; Future  Chronic obstructive pulmonary disease, unspecified COPD type ([place])  Coronary artery disease involving native heart without angina pectoris, unspecified vessel or lesion type  Vaccine counseling  Port-A-Cath in place  Polyp of colon, unspecified part of colon, unspecified type  Impaired fasting glucose -     Hemoglobin A1c; Future       Medicare Attestation A preventative services visit was completed today.  During the course of the visit the patient was educated and counseled about appropriate screening and preventive services.  A health risk assessment was established with the patient that included a review of current medications, allergies, social history, family history, medical and preventative health history, biometrics, and preventative screenings to identify potential safety concerns or impairments.  A personalized plan was printed today for the patient's records and use.   Personalized health advice and education was given today to reduce health risks and promote self management and wellness.  Information regarding end of life planning was discussed today.  Dorothea Ogle, PA-C   10/23/2021

## 2021-10-28 ENCOUNTER — Inpatient Hospital Stay: Payer: Medicare HMO

## 2021-10-28 ENCOUNTER — Other Ambulatory Visit: Payer: Self-pay

## 2021-10-28 ENCOUNTER — Telehealth: Payer: Self-pay | Admitting: *Deleted

## 2021-10-28 ENCOUNTER — Other Ambulatory Visit: Payer: Self-pay | Admitting: Medical

## 2021-10-28 ENCOUNTER — Inpatient Hospital Stay: Payer: Medicare HMO | Admitting: Internal Medicine

## 2021-10-28 VITALS — BP 144/77 | HR 84 | Temp 97.9°F | Resp 20 | Ht 68.5 in | Wt 229.3 lb

## 2021-10-28 DIAGNOSIS — C450 Mesothelioma of pleura: Secondary | ICD-10-CM

## 2021-10-28 DIAGNOSIS — Z5112 Encounter for antineoplastic immunotherapy: Secondary | ICD-10-CM | POA: Diagnosis not present

## 2021-10-28 DIAGNOSIS — K1230 Oral mucositis (ulcerative), unspecified: Secondary | ICD-10-CM | POA: Diagnosis not present

## 2021-10-28 DIAGNOSIS — Z95828 Presence of other vascular implants and grafts: Secondary | ICD-10-CM

## 2021-10-28 DIAGNOSIS — E785 Hyperlipidemia, unspecified: Secondary | ICD-10-CM | POA: Diagnosis not present

## 2021-10-28 DIAGNOSIS — N400 Enlarged prostate without lower urinary tract symptoms: Secondary | ICD-10-CM | POA: Diagnosis not present

## 2021-10-28 DIAGNOSIS — K219 Gastro-esophageal reflux disease without esophagitis: Secondary | ICD-10-CM | POA: Diagnosis not present

## 2021-10-28 DIAGNOSIS — Z7689 Persons encountering health services in other specified circumstances: Secondary | ICD-10-CM | POA: Diagnosis not present

## 2021-10-28 DIAGNOSIS — Z79899 Other long term (current) drug therapy: Secondary | ICD-10-CM | POA: Diagnosis not present

## 2021-10-28 DIAGNOSIS — K3184 Gastroparesis: Secondary | ICD-10-CM | POA: Diagnosis not present

## 2021-10-28 DIAGNOSIS — Z8711 Personal history of peptic ulcer disease: Secondary | ICD-10-CM | POA: Diagnosis not present

## 2021-10-28 DIAGNOSIS — Z7982 Long term (current) use of aspirin: Secondary | ICD-10-CM | POA: Diagnosis not present

## 2021-10-28 DIAGNOSIS — Z125 Encounter for screening for malignant neoplasm of prostate: Secondary | ICD-10-CM | POA: Diagnosis not present

## 2021-10-28 DIAGNOSIS — Z8719 Personal history of other diseases of the digestive system: Secondary | ICD-10-CM | POA: Diagnosis not present

## 2021-10-28 LAB — CMP (CANCER CENTER ONLY)
ALT: 8 U/L (ref 0–44)
AST: 11 U/L — ABNORMAL LOW (ref 15–41)
Albumin: 3.7 g/dL (ref 3.5–5.0)
Alkaline Phosphatase: 93 U/L (ref 38–126)
Anion gap: 6 (ref 5–15)
BUN: 10 mg/dL (ref 8–23)
CO2: 29 mmol/L (ref 22–32)
Calcium: 9 mg/dL (ref 8.9–10.3)
Chloride: 104 mmol/L (ref 98–111)
Creatinine: 0.74 mg/dL (ref 0.61–1.24)
GFR, Estimated: >60 mL/min (ref >60)
Glucose, Bld: 99 mg/dL (ref 70–99)
Potassium: 3.8 mmol/L (ref 3.5–5.1)
Sodium: 139 mmol/L (ref 135–145)
Total Bilirubin: 0.6 mg/dL (ref 0.3–1.2)
Total Protein: 6.8 g/dL (ref 6.5–8.1)

## 2021-10-28 LAB — CBC WITH DIFFERENTIAL (CANCER CENTER ONLY)
Abs Immature Granulocytes: 0.01 10*3/uL (ref 0.00–0.07)
Basophils Absolute: 0 10*3/uL (ref 0.0–0.1)
Basophils Relative: 1 %
Eosinophils Absolute: 0.3 10*3/uL (ref 0.0–0.5)
Eosinophils Relative: 5 %
HCT: 37.8 % — ABNORMAL LOW (ref 39.0–52.0)
Hemoglobin: 12.6 g/dL — ABNORMAL LOW (ref 13.0–17.0)
Immature Granulocytes: 0 %
Lymphocytes Relative: 17 %
Lymphs Abs: 1 10*3/uL (ref 0.7–4.0)
MCH: 31.7 pg (ref 26.0–34.0)
MCHC: 33.3 g/dL (ref 30.0–36.0)
MCV: 95 fL (ref 80.0–100.0)
Monocytes Absolute: 0.6 10*3/uL (ref 0.1–1.0)
Monocytes Relative: 10 %
Neutro Abs: 4 10*3/uL (ref 1.7–7.7)
Neutrophils Relative %: 67 %
Platelet Count: 175 10*3/uL (ref 150–400)
RBC: 3.98 MIL/uL — ABNORMAL LOW (ref 4.22–5.81)
RDW: 13.6 % (ref 11.5–15.5)
WBC Count: 5.8 10*3/uL (ref 4.0–10.5)
nRBC: 0 % (ref 0.0–0.2)

## 2021-10-28 LAB — TSH: TSH: 1.649 u[IU]/mL (ref 0.320–4.118)

## 2021-10-28 MED ORDER — SODIUM CHLORIDE 0.9 % IV SOLN
360.0000 mg | Freq: Once | INTRAVENOUS | Status: AC
  Administered 2021-10-28: 360 mg via INTRAVENOUS
  Filled 2021-10-28: qty 24

## 2021-10-28 MED ORDER — HEPARIN SOD (PORK) LOCK FLUSH 100 UNIT/ML IV SOLN
500.0000 [IU] | Freq: Once | INTRAVENOUS | Status: AC | PRN
  Administered 2021-10-28: 500 [IU]

## 2021-10-28 MED ORDER — SODIUM CHLORIDE 0.9% FLUSH
10.0000 mL | Freq: Once | INTRAVENOUS | Status: AC
  Administered 2021-10-28: 10 mL

## 2021-10-28 MED ORDER — MAGIC MOUTHWASH: 5.0000 mL | Freq: Three times a day (TID) | ORAL | 0 refills | Status: DC | PRN

## 2021-10-28 MED ORDER — SODIUM CHLORIDE 0.9% FLUSH
10.0000 mL | INTRAVENOUS | Status: DC | PRN
  Administered 2021-10-28: 10 mL

## 2021-10-28 MED ORDER — SODIUM CHLORIDE 0.9 % IV SOLN: Freq: Once | INTRAVENOUS | Status: AC

## 2021-10-28 NOTE — Progress Notes (Signed)
[place] [phone]   [phone]) [phone]  OFFICE PROGRESS NOTE  Carlena Hurl, PA-C [place] [address]  DIAGNOSIS: Stage IB (T2, N0, M0) unresectable right malignant pleural mesothelioma.,  Epithelioid type diagnosed in June 2022.  PRIOR THERAPY:  1) Status post right VATS with pleural biopsy and talc pleurodesis under the care of Dr. Roxan Hockey on 02/27/2021. 2) Systemic chemotherapy with carboplatin for AUC of 5, Alimta 500 Mg/M2 and Avastin 15 mg/KG every 3 weeks.  First dose March 23, 2021.  Status post 9 cycles.  Starting from cycle #7 he is on treatment with single agent Avastin.  CURRENT THERAPY: Second line treatment with immunotherapy with ipilimumab 1 Mg/KG every 3 weeks in addition to nivolumab 360 Mg IV every 3 weeks.  First dose October 08, 2021.  Status post day 1 of cycle #1.  INTERVAL HISTORY: Gerald Kuehl 71 y.o. male returns to the clinic today for follow-up visit.  The patient is feeling fine today with no concerning complaints.  He tolerated the first 3 weeks of his treatment fairly well with no concerning adverse effects.  He denied having any chest pain, shortness of breath except with exertion with no cough or hemoptysis.  He denied having any nausea, vomiting, diarrhea or constipation.  He denied having any headache or visual changes.  He has no fever or chills.  He is here today for evaluation before starting day #22 of cycle #1.  MEDICAL HISTORY: Past Medical History:  Diagnosis Date   Arthritis    hands   Enlarged prostate    takes Terazosin daily   Gastroparesis    GERD (gastroesophageal reflux disease)    occasionally but no meds required   H/O cardiovascular stress test 10/2019   nuclear, normal, Dr. Fransico Him   History of colon polyps    History of gastric ulcer    Joint pain    knees and ankles   Urinary frequency     ALLERGIES:  has No Known Allergies.  MEDICATIONS:  Current  Outpatient Medications  Medication Sig Dispense Refill   acetaminophen (TYLENOL) 500 MG tablet Take 2 tablets (1,000 mg total) by mouth every 6 (six) hours as needed. 30 tablet 0   albuterol (VENTOLIN HFA) 108 (90 Base) MCG/ACT inhaler Inhale 2 puffs into the lungs every 6 (six) hours as needed for wheezing or shortness of breath. (Patient not taking: Reported on 10/22/2021) 8 g 0   aspirin 81 MG EC tablet Take 81 mg by mouth daily.     atorvastatin (LIPITOR) 80 MG tablet TAKE 1 TABLET BY MOUTH EVERY DAY 90 tablet 0   folic acid (FOLVITE) 1 MG tablet TAKE 1 TABLET BY MOUTH EVERY DAY 90 tablet 1   glucosamine-chondroitin 500-400 MG tablet Take 1 tablet by mouth daily.     L-Lysine 1000 MG TABS Take 1-2 tablets by mouth as needed (Daily as needed).     lidocaine-prilocaine (EMLA) cream Apply to the Port-A-Cath site 30-60-minute before chemotherapy. 30 g 0   magic mouthwash SOLN Take 5 mLs by mouth 3 (three) times daily as needed for mouth pain. 240 mL 0   omeprazole (PRILOSEC) 20 MG capsule Take 20 mg by mouth daily.     OVER THE COUNTER MEDICATION Mushroom super blend (immunity and brain supplement) daily     oxymetazoline (AFRIN) 0.05 % nasal spray Place 1 spray into both nostrils as needed for congestion.     prochlorperazine (COMPAZINE) 10  MG tablet TAKE 1 TABLET BY MOUTH EVERY 6 HOURS AS NEEDED FOR NAUSEA OR VOMITING. 30 tablet 0   terazosin (HYTRIN) 10 MG capsule TAKE 1 CAPSULE BY MOUTH EVERY DAY 90 capsule 0   Turmeric (QC TUMERIC COMPLEX PO) Take 450 mg by mouth daily.     No current facility-administered medications for this visit.    SURGICAL HISTORY:  Past Surgical History:  Procedure Laterality Date   CHOLECYSTECTOMY N/A 11/19/2013   Procedure: LAPAROSCOPIC CHOLECYSTECTOMY;  Surgeon: Harl Bowie, MD;  Location: [place];  Service: General;  Laterality: N/A;   COLONOSCOPY     DIAGNOSTIC LAPAROSCOPY     lap chole   ESOPHAGOGASTRODUODENOSCOPY     IR IMAGING GUIDED PORT  INSERTION  03/11/2021   skin spots removed and tested     done in the office   THORACENTESIS N/A 01/14/2021   Procedure: THORACENTESIS;  Surgeon: Collene Gobble, MD;  Location: [place];  Service: Cardiopulmonary;  Laterality: N/A;   THORACENTESIS N/A 01/23/2021   Procedure: THORACENTESIS;  Surgeon: Collene Gobble, MD;  Location: [facility]COPY;  Service: Cardiopulmonary;  Laterality: N/A;   VIDEO ASSISTED THORACOSCOPY (VATS) W/TALC PLEUADESIS Right 02/27/2021   Procedure: VIDEO ASSISTED THORACOSCOPY (VATS) WITH  TALC PLEUADESIS and PLEURAL BIOPSY;  Surgeon: Melrose Nakayama, MD;  Location: [facility];  Service: Thoracic;  Laterality: Right;    REVIEW OF SYSTEMS:  A comprehensive review of systems was negative except for: Respiratory: positive for dyspnea on exertion   PHYSICAL EXAMINATION: General appearance: alert, cooperative, and no distress Head: Normocephalic, without obvious abnormality, atraumatic Neck: no adenopathy, no JVD, supple, symmetrical, trachea midline, and thyroid not enlarged, symmetric, no tenderness/mass/nodules Lymph nodes: Cervical, supraclavicular, and axillary nodes normal. Resp: clear to auscultation bilaterally Back: symmetric, no curvature. ROM normal. No CVA tenderness. Cardio: regular rate and rhythm, S1, S2 normal, no murmur, click, rub or gallop GI: soft, non-tender; bowel sounds normal; no masses,  no organomegaly Extremities: extremities normal, atraumatic, no cyanosis or edema  ECOG PERFORMANCE STATUS: 1 - Symptomatic but completely ambulatory  Blood pressure (!) 144/77, pulse 84, temperature 97.9 F (36.6 C), temperature source Tympanic, resp. rate 20, height 5' 8.5" (1.74 m), weight 229 lb 4.8 oz (104 kg), SpO2 96 %.  LABORATORY DATA: Lab Results  Component Value Date   WBC 5.8 10/28/2021   HGB 12.6 (L) 10/28/2021   HCT 37.8 (L) 10/28/2021   MCV 95.0 10/28/2021   PLT 175 10/28/2021      Chemistry      Component Value Date/Time   NA 139  10/07/2021 1317   NA 141 10/20/2020 1030   K 3.6 10/07/2021 1317   CL 103 10/07/2021 1317   CO2 32 10/07/2021 1317   BUN 9 10/07/2021 1317   BUN 13 10/20/2020 1030   CREATININE 0.84 10/07/2021 1317   CREATININE 1.03 10/18/2016 1027      Component Value Date/Time   CALCIUM 9.3 10/07/2021 1317   ALKPHOS 87 10/07/2021 1317   AST 12 (L) 10/07/2021 1317   ALT 13 10/07/2021 1317   BILITOT 0.6 10/07/2021 1317       RADIOGRAPHIC STUDIES: No results found.  ASSESSMENT AND PLAN: This is a very pleasant 71 years old white male recently diagnosed with unresectable stage IB (T2, N0, M0) malignant pleural mesothelioma, epithelioid type diagnosed and June 2022 and presented with extensive involvement of the right hemithorax. The patient is status post VATS with pleural biopsies and talc pleurodesis in June 2022 under the  care of Dr. Roxan Hockey. The patient started systemic chemotherapy with carboplatin for AUC of 5, Alimta 500 Mg/M2 and Avastin 15 Mg/KG every 3 weeks.  First dose March 23, 2021.  Status post 9 cycles.  Starting from cycle #7 the patient is on maintenance treatment with single agent Avastin every 3 weeks.   He has been tolerating his treatment with systemic chemotherapy and Avastin fairly well. He had repeat CT scan of the chest performed recently.  I personally and independently reviewed the scan images and discussed the result and showed the images to the patient and his wife. Unfortunately his scan showed interval disease progression with progressive pleural studding. I recommended for the patient to discontinue his treatment with Avastin at this point. He is currently on treatment with immunotherapy with ipilimumab 1 Mg/KG every 6 weeks in addition to nivolumab 350 mg IV every 3 weeks until disease progression or unacceptable toxicity.  Status post day 1 of cycle #1. The patient tolerated the first cycle of his treatment well with no concerning adverse effects. I recommended for  him to proceed with day #22 of cycle #1 as planned. He will come back for follow-up visit in 3 weeks for evaluation before starting day 1 of cycle #2. The patient is moving to [place] in around 6 weeks and he would like a referral to a medical oncologist in that area for continuation of his treatment. I referred the patient to Dr. Jolaine Click who is a lung cancer specialist in this area. He was advised to call immediately if he has any other concerning symptoms in the interval. The patient voices understanding of current disease status and treatment options and is in agreement with the current care plan.  All questions were answered. The patient knows to call the clinic with any problems, questions or concerns. We can certainly see the patient much sooner if necessary.  Disclaimer: This note was dictated with voice recognition software. Similar sounding words can inadvertently be transcribed and may not be corrected upon review.

## 2021-10-28 NOTE — Telephone Encounter (Signed)
Referral called to [facility] [phone]. Office notes, labs, pathology, CT and Demographics faxed to New patient coordinator [phone]

## 2021-10-28 NOTE — Patient Instructions (Signed)
[facility] MEDICAL ONCOLOG[facility]  ? Discharge Instructions: ?Thank you for choosing [facility] to provide your oncology and hematology care.  ? ?If you have a lab appointment with the Cancer Center, please go directly to the Cancer Center and check in at the registration area. ?  ?Wear comfortable clothing and clothing appropriate for easy access to any Portacath or PICC line.  ? ?We strive to give you quality time with your provider. You may need to reschedule your appointment if you arrive late (15 or more minutes).  Arriving late affects you and other patients whose appointments are after yours.  Also, if you miss three or more appointments without notifying the office, you may be dismissed from the clinic at the provider?s discretion.    ?  ?For prescription refill requests, have your pharmacy contact our office and allow 72 hours for refills to be completed.   ? ?Today you received the following chemotherapy and/or immunotherapy agents: nivolumab    ?  ?To help prevent nausea and vomiting after your treatment, we encourage you to take your nausea medication as directed. ? ?BELOW ARE SYMPTOMS THAT SHOULD BE REPORTED IMMEDIATELY: ?*FEVER GREATER THAN 100.4 F (38 ?C) OR HIGHER ?*CHILLS OR SWEATING ?*NAUSEA AND VOMITING THAT IS NOT CONTROLLED WITH YOUR NAUSEA MEDICATION ?*UNUSUAL SHORTNESS OF BREATH ?*UNUSUAL BRUISING OR BLEEDING ?*URINARY PROBLEMS (pain or burning when urinating, or frequent urination) ?*BOWEL PROBLEMS (unusual diarrhea, constipation, pain near the anus) ?TENDERNESS IN MOUTH AND THROAT WITH OR WITHOUT PRESENCE OF ULCERS (sore throat, sores in mouth, or a toothache) ?UNUSUAL RASH, SWELLING OR PAIN  ?UNUSUAL VAGINAL DISCHARGE OR ITCHING  ? ?Items with * indicate a potential emergency and should be followed up as soon as possible or go to the Emergency Department if any problems should occur. ? ?Please show the CHEMOTHERAPY ALERT CARD or IMMUNOTHERAPY ALERT CARD at check-in  to the Emergency Department and triage nurse. ? ?Should you have questions after your visit or need to cancel or reschedule your appointment, please contact [facility]  Dept: [phone]  and follow the prompts.  Office hours are 8:00 a.m. to 4:30 p.m. Monday - Friday. Please note that voicemails left after 4:00 p.m. may not be returned until the following business day.  We are closed weekends and major holidays. You have access to a nurse at all times for urgent questions. Please call the main number to the clinic Dept: [phone] and follow the prompts. ? ? ?For any non-urgent questions, you may also contact your provider using MyChart. We now offer e-Visits for anyone 18 and older to request care online for non-urgent symptoms. For details visit [email or web address]. ?  ?Also download the MyChart app! Go to the app store, search "MyChart", open the app, select [facility], and log in with your MyChart username and password. ? ?Due to Covid, a mask is required upon entering the hospital/clinic. If you do not have a mask, one will be given to you upon arrival. For doctor visits, patients may have 1 support person aged 18 or older with them. For treatment visits, patients cannot have anyone with them due to current Covid guidelines and our immunocompromised population.  ? ?

## 2021-10-29 ENCOUNTER — Other Ambulatory Visit: Payer: Self-pay | Admitting: Medical Oncology

## 2021-10-29 LAB — LIPID PANEL W/O CHOL/HDL RATIO

## 2021-10-29 MED ORDER — MAGIC MOUTHWASH: 5.0000 mL | Freq: Three times a day (TID) | ORAL | 1 refills | Status: DC | PRN

## 2021-10-29 NOTE — Progress Notes (Signed)
Please mail rx for magic mouthwash to pt . Rx sent to [facility] to sign first.

## 2021-10-29 NOTE — Addendum Note (Signed)
Addended by: Ardeen Garland on: 10/29/2021 02:16 PM   Modules accepted: Orders

## 2021-10-30 LAB — HGB A1C W/O EAG: Hgb A1c MFr Bld: 5.4 % (ref 4.8–5.6)

## 2021-10-30 LAB — LIPID PANEL W/O CHOL/HDL RATIO
Cholesterol, Total: 124 mg/dL (ref 100–199)
HDL: 48 mg/dL (ref >39)
LDL Chol Calc (NIH): 59 mg/dL (ref 0–99)
Triglycerides: 86 mg/dL (ref 0–149)
VLDL Cholesterol Cal: 17 mg/dL (ref 5–40)

## 2021-10-30 LAB — SPECIMEN STATUS REPORT

## 2021-10-30 LAB — PSA: Prostate Specific Ag, Serum: 5.5 ng/mL — ABNORMAL HIGH (ref 0.0–4.0)

## 2021-11-03 ENCOUNTER — Other Ambulatory Visit: Payer: Self-pay | Admitting: Medical

## 2021-11-03 DIAGNOSIS — E7800 Pure hypercholesterolemia, unspecified: Secondary | ICD-10-CM | POA: Diagnosis not present

## 2021-11-03 DIAGNOSIS — J441 Chronic obstructive pulmonary disease with (acute) exacerbation: Secondary | ICD-10-CM

## 2021-11-08 IMAGING — DX DG CHEST 1V PORT
1 series · 1 of 1 positions shown · non-contrast
Comparison: 02/28/2021

CLINICAL DATA: Cough, shortness of breath right-sided chest tube,
pleural effusion

EXAM:
PORTABLE CHEST 1 VIEW

[chest ap]
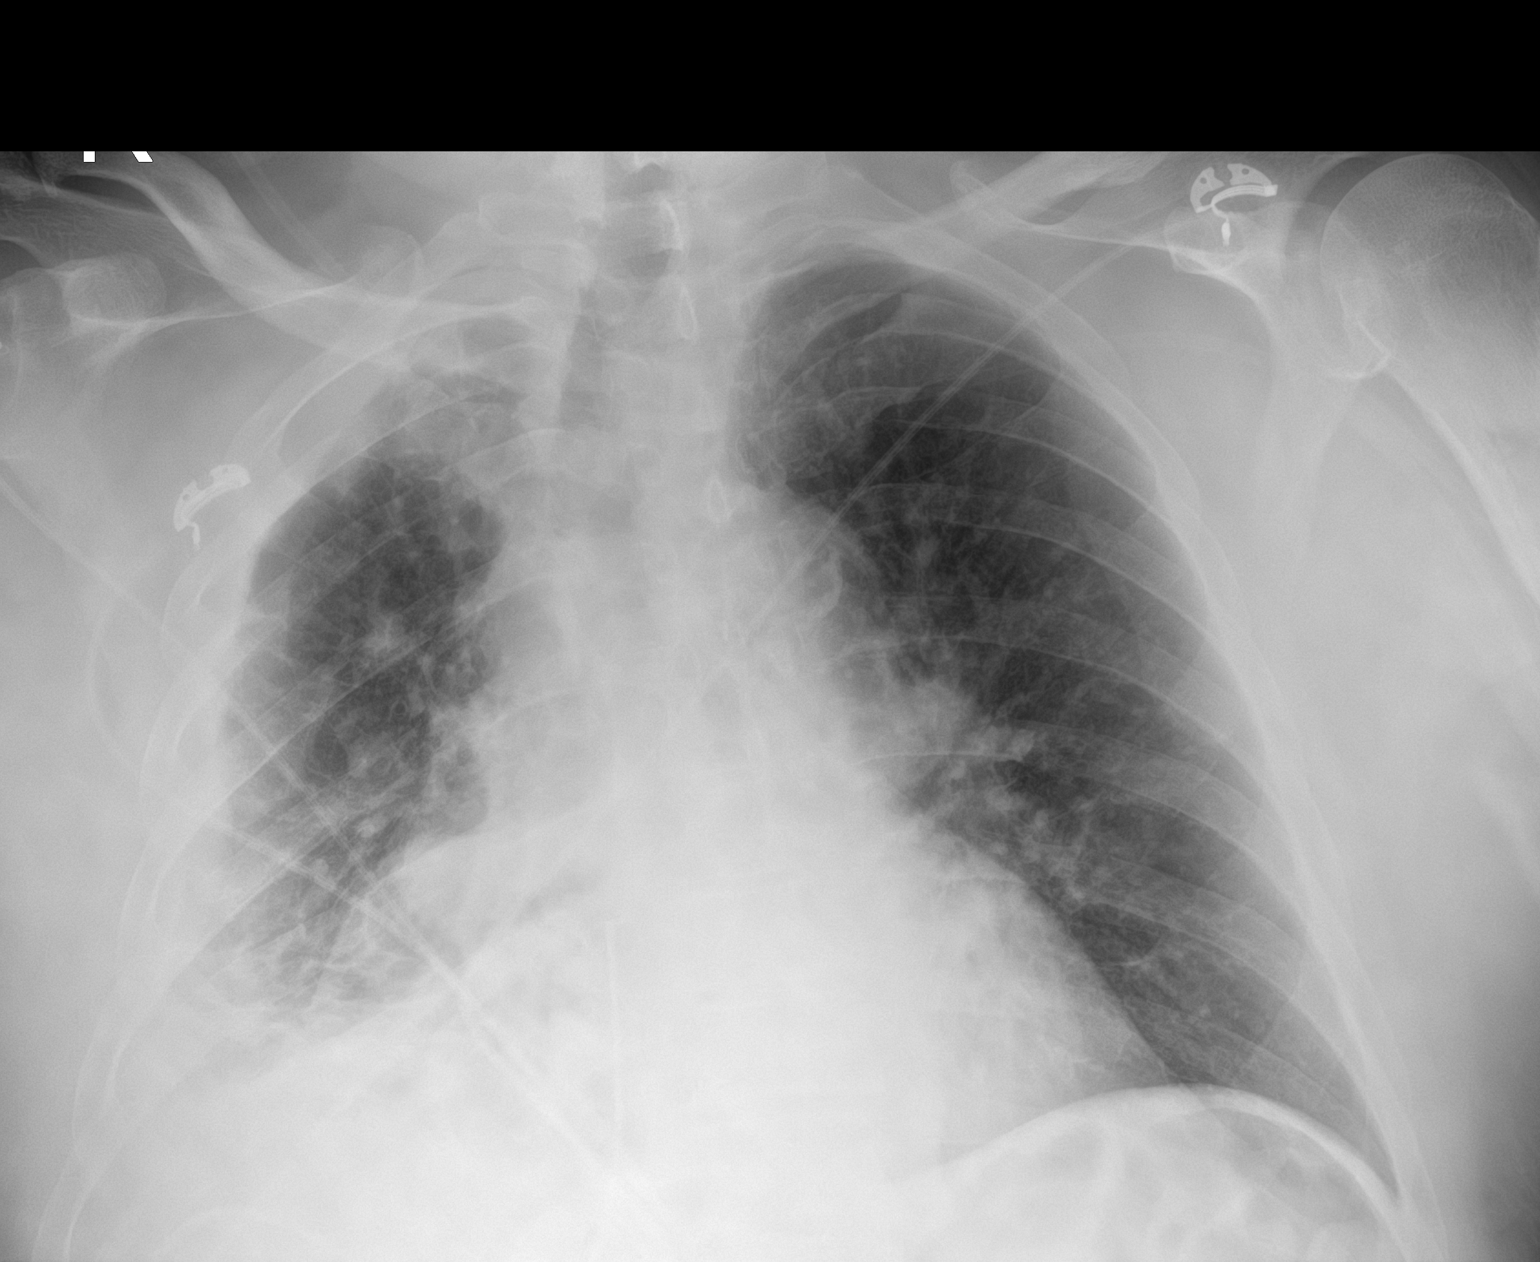

[1 of 1 positions shown; findings below may reference images not displayed]

FINDINGS: Small right pleural effusion and associated atelectasis or
consolidation. Right-sided chest tubes remain in position. No
pneumothorax. The left lung is normally aerated. Cardiomegaly.
IMPRESSION: 1. Small right pleural effusion and associated atelectasis or
consolidation. Right-sided chest tubes remain in position. No
pneumothorax.

2.  Cardiomegaly.

## 2021-11-09 ENCOUNTER — Other Ambulatory Visit: Payer: Medicare HMO

## 2021-11-09 ENCOUNTER — Other Ambulatory Visit (HOSPITAL_COMMUNITY): Payer: Self-pay

## 2021-11-09 ENCOUNTER — Ambulatory Visit: Payer: Medicare HMO

## 2021-11-09 ENCOUNTER — Ambulatory Visit: Payer: Medicare HMO | Admitting: Internal Medicine

## 2021-11-12 NOTE — Progress Notes (Unsigned)
[facility] OFFICE PROGRESS NOTE  Carlena Hurl, PA-C [place] [address]  DIAGNOSIS:  Stage IB (T2, N0, M0) unresectable right malignant pleural mesothelioma.,  Epithelioid type diagnosed in June 2022.  PRIOR THERAPY: 1) Status post right VATS with pleural biopsy and talc pleurodesis under the care of Dr. Roxan Hockey on 02/27/2021. 2) Systemic chemotherapy with carboplatin for AUC of 5, Alimta 500 Mg/M2 and Avastin 15 mg/KG every 3 weeks.  First dose March 23, 2021.  Status post 9 cycles.  Starting from cycle #7 he is on treatment with single agent Avastin.    CURRENT THERAPY: Second line treatment with immunotherapy with ipilimumab 1 Mg/KG every 3 weeks in addition to nivolumab 360 Mg IV every 3 weeks.  First dose October 08, 2021.  Status post day 21 of cycle #1.  INTERVAL HISTORY: Guy Evans 71 y.o. male returns to clinic today for follow-up visit.  The patient is feeling fairly well today without any concerning complaints.  His treatment was recently switched to immunotherapy after he was found to have evidence of disease progression.  He underwent his first cycle of treatment and he tolerated it ***.  He denies any recent fever, chills, night sweats, or unexplained weight loss.  Denies any chest pain, shortness of breath, cough, or hemoptysis.  Denies any nausea, vomiting, diarrhea, or constipation.  Denies any headache or visual changes.  Denies any rashes or skin changes.  He is here today for evaluation and repeat blood work before starting day 1 cycle 2. MEDICAL HISTORY: Past Medical History:  Diagnosis Date   Arthritis    hands   Enlarged prostate    takes Terazosin daily   Gastroparesis    GERD (gastroesophageal reflux disease)    occasionally but no meds required   H/O cardiovascular stress test 10/2019   nuclear, normal, Dr. Fransico Him   History of colon polyps    History of gastric ulcer    Joint pain    knees and ankles    Urinary frequency     ALLERGIES:  has No Known Allergies.  MEDICATIONS:  Current Outpatient Medications  Medication Sig Dispense Refill   acetaminophen (TYLENOL) 500 MG tablet Take 2 tablets (1,000 mg total) by mouth every 6 (six) hours as needed. 30 tablet 0   albuterol (VENTOLIN HFA) 108 (90 Base) MCG/ACT inhaler Inhale 2 puffs into the lungs every 6 (six) hours as needed for wheezing or shortness of breath. (Patient not taking: Reported on 10/22/2021) 8 g 0   aspirin 81 MG EC tablet Take 81 mg by mouth daily.     atorvastatin (LIPITOR) 80 MG tablet TAKE 1 TABLET BY MOUTH EVERY DAY 90 tablet 1   folic acid (FOLVITE) 1 MG tablet TAKE 1 TABLET BY MOUTH EVERY DAY 90 tablet 1   glucosamine-chondroitin 500-400 MG tablet Take 1 tablet by mouth daily.     L-Lysine 1000 MG TABS Take 1-2 tablets by mouth as needed (Daily as needed).     lidocaine-prilocaine (EMLA) cream Apply to the Port-A-Cath site 30-60-minute before chemotherapy. 30 g 0   magic mouthwash SOLN Take 5 mLs by mouth 3 (three) times daily as needed for mouth pain. Components- Benadryl 12.5 mg/5 ml, Hydrocortisone 60 mg and Nystatin 30 mg 240 mL 1   omeprazole (PRILOSEC) 20 MG capsule Take 20 mg by mouth daily.     OVER THE COUNTER MEDICATION Mushroom super blend (immunity and brain supplement) daily     oxymetazoline (AFRIN)  0.05 % nasal spray Place 1 spray into both nostrils as needed for congestion.     prochlorperazine (COMPAZINE) 10 MG tablet TAKE 1 TABLET BY MOUTH EVERY 6 HOURS AS NEEDED FOR NAUSEA OR VOMITING. 30 tablet 0   terazosin (HYTRIN) 10 MG capsule TAKE 1 CAPSULE BY MOUTH EVERY DAY 90 capsule 0   Turmeric (QC TUMERIC COMPLEX PO) Take 450 mg by mouth daily.     No current facility-administered medications for this visit.    SURGICAL HISTORY:  Past Surgical History:  Procedure Laterality Date   CHOLECYSTECTOMY N/A 11/19/2013   Procedure: LAPAROSCOPIC CHOLECYSTECTOMY;  Surgeon: Harl Bowie, MD;  Location: [place];  Service: General;  Laterality: N/A;   COLONOSCOPY     DIAGNOSTIC LAPAROSCOPY     lap chole   ESOPHAGOGASTRODUODENOSCOPY     IR IMAGING GUIDED PORT INSERTION  03/11/2021   skin spots removed and tested     done in the office   THORACENTESIS N/A 01/14/2021   Procedure: THORACENTESIS;  Surgeon: Collene Gobble, MD;  Location: [place];  Service: Cardiopulmonary;  Laterality: N/A;   THORACENTESIS N/A 01/23/2021   Procedure: THORACENTESIS;  Surgeon: Collene Gobble, MD;  Location: [facility] ENDOSCOPY;  Service: Cardiopulmonary;  Laterality: N/A;   VIDEO ASSISTED THORACOSCOPY (VATS) W/TALC PLEUADESIS Right 02/27/2021   Procedure: VIDEO ASSISTED THORACOSCOPY (VATS) WITH  TALC PLEUADESIS and PLEURAL BIOPSY;  Surgeon: Melrose Nakayama, MD;  Location: [facility];  Service: Thoracic;  Laterality: Right;    REVIEW OF SYSTEMS:   Review of Systems  Constitutional: Negative for appetite change, chills, fatigue, fever and unexpected weight change.  HENT:   Negative for mouth sores, nosebleeds, sore throat and trouble swallowing.   Eyes: Negative for eye problems and icterus.  Respiratory: Negative for cough, hemoptysis, shortness of breath and wheezing.   Cardiovascular: Negative for chest pain and leg swelling.  Gastrointestinal: Negative for abdominal pain, constipation, diarrhea, nausea and vomiting.  Genitourinary: Negative for bladder incontinence, difficulty urinating, dysuria, frequency and hematuria.   Musculoskeletal: Negative for back pain, gait problem, neck pain and neck stiffness.  Skin: Negative for itching and rash.  Neurological: Negative for dizziness, extremity weakness, gait problem, headaches, light-headedness and seizures.  Hematological: Negative for adenopathy. Does not bruise/bleed easily.  Psychiatric/Behavioral: Negative for confusion, depression and sleep disturbance. The patient is not nervous/anxious.     PHYSICAL EXAMINATION:  There were no vitals taken for this  visit.  ECOG PERFORMANCE STATUS: {CHL ONC ECOG [id number]  Physical Exam  Constitutional: Oriented to person, place, and time and well-developed, well-nourished, and in no distress. No distress.  HENT:  Head: Normocephalic and atraumatic.  Mouth/Throat: Oropharynx is clear and moist. No oropharyngeal exudate.  Eyes: Conjunctivae are normal. Right eye exhibits no discharge. Left eye exhibits no discharge. No scleral icterus.  Neck: Normal range of motion. Neck supple.  Cardiovascular: Normal rate, regular rhythm, normal heart sounds and intact distal pulses.   Pulmonary/Chest: Effort normal and breath sounds normal. No respiratory distress. No wheezes. No rales.  Abdominal: Soft. Bowel sounds are normal. Exhibits no distension and no mass. There is no tenderness.  Musculoskeletal: Normal range of motion. Exhibits no edema.  Lymphadenopathy:    No cervical adenopathy.  Neurological: Alert and oriented to person, place, and time. Exhibits normal muscle tone. Gait normal. Coordination normal.  Skin: Skin is warm and dry. No rash noted. Not diaphoretic. No erythema. No pallor.  Psychiatric: Mood, memory and judgment normal.  Vitals reviewed.  LABORATORY DATA:  Lab Results  Component Value Date   WBC 5.8 10/28/2021   HGB 12.6 (L) 10/28/2021   HCT 37.8 (L) 10/28/2021   MCV 95.0 10/28/2021   PLT 175 10/28/2021      Chemistry      Component Value Date/Time   NA 139 10/28/2021 1025   NA 141 10/20/2020 1030   K 3.8 10/28/2021 1025   CL 104 10/28/2021 1025   CO2 29 10/28/2021 1025   BUN 10 10/28/2021 1025   BUN 13 10/20/2020 1030   CREATININE 0.74 10/28/2021 1025   CREATININE 1.03 10/18/2016 1027      Component Value Date/Time   CALCIUM 9.0 10/28/2021 1025   ALKPHOS 93 10/28/2021 1025   AST 11 (L) 10/28/2021 1025   ALT 8 10/28/2021 1025   BILITOT 0.6 10/28/2021 1025       RADIOGRAPHIC STUDIES:  No results found.   ASSESSMENT/PLAN:  This is a very pleasant  71 year old Caucasian male diagnosed with unresectable stage Ib (T2, N0, M0) malignant pleural mesothelioma epithelioid type.  He was diagnosed in June 2022.  He presented with extensive involvement of the right hemithorax.   Patient underwent VATS with pleural biopsy and Talc pleurodesis in June 2022 under the care of Dr. Roxan Hockey.  This was performed on 02/27/2021.  The patient started systemic chemotherapy with carboplatin for AUC of 5, Alimta 500 Mg/M2 and Avastin 15 Mg/KG every 3 weeks.  First dose March 23, 2021.  Status post 9 cycles.  Starting from cycle #7 the patient had been on maintenance treatment with single agent Avastin every 3 weeks.  This was discontinued due to evidence of disease progression with pleural studding.  Therefore, Dr. Julien Nordmann change his treatment to immunotherapy with ipilimumab 1 mg/kg every 6 weeks in addition to nivolumab 350 mg IV every 3 weeks.  The patient is status post day 21 cycle 1.  The patient was seen with Dr. Julien Nordmann today.  Labs were reviewed.  Recommend that he ***with day 1 cycle 2 today scheduled.  I will arrange for restaging CT scan the chest, abdomen, pelvis prior to starting his neck cycle of treatment?  Or just chest? Or moving???   The patient is moving to [place] in around 6 weeks and he would like a referral to a medical oncologist in that area for continuation of his treatment.****  We will see him back for follow-up visit in 3 weeks for evaluation before starting cycle number 2-day 21.  Dr. Julien Nordmann referred the patient to Dr. Jolaine Click who is a lung cancer specialist in this area.   No orders of the defined types were placed in this encounter.    I spent {CHL ONC TIME VISIT - YSAYT:[id number]} counseling the patient face to face. The total time spent in the appointment was {CHL ONC TIME VISIT - FTDDU:[id number]}.  Shawon Denzer L Landrey Mahurin, PA-C 11/12/21'

## 2021-11-17 ENCOUNTER — Inpatient Hospital Stay (HOSPITAL_BASED_OUTPATIENT_CLINIC_OR_DEPARTMENT_OTHER): Payer: Medicare HMO | Admitting: Physician Assistant

## 2021-11-17 ENCOUNTER — Encounter: Payer: Self-pay | Admitting: Physician Assistant

## 2021-11-17 ENCOUNTER — Inpatient Hospital Stay: Payer: Medicare HMO | Attending: Internal Medicine

## 2021-11-17 ENCOUNTER — Inpatient Hospital Stay: Payer: Medicare HMO

## 2021-11-17 ENCOUNTER — Other Ambulatory Visit: Payer: Self-pay

## 2021-11-17 VITALS — BP 144/82 | HR 78 | Temp 97.7°F | Resp 18 | Ht 68.5 in | Wt 228.7 lb

## 2021-11-17 DIAGNOSIS — Z79899 Other long term (current) drug therapy: Secondary | ICD-10-CM | POA: Diagnosis not present

## 2021-11-17 DIAGNOSIS — Z5112 Encounter for antineoplastic immunotherapy: Secondary | ICD-10-CM | POA: Insufficient documentation

## 2021-11-17 DIAGNOSIS — Z7982 Long term (current) use of aspirin: Secondary | ICD-10-CM | POA: Diagnosis not present

## 2021-11-17 DIAGNOSIS — K3184 Gastroparesis: Secondary | ICD-10-CM | POA: Diagnosis not present

## 2021-11-17 DIAGNOSIS — K1379 Other lesions of oral mucosa: Secondary | ICD-10-CM | POA: Diagnosis not present

## 2021-11-17 DIAGNOSIS — Z8711 Personal history of peptic ulcer disease: Secondary | ICD-10-CM | POA: Insufficient documentation

## 2021-11-17 DIAGNOSIS — C450 Mesothelioma of pleura: Secondary | ICD-10-CM | POA: Insufficient documentation

## 2021-11-17 DIAGNOSIS — N400 Enlarged prostate without lower urinary tract symptoms: Secondary | ICD-10-CM | POA: Diagnosis not present

## 2021-11-17 DIAGNOSIS — Z8719 Personal history of other diseases of the digestive system: Secondary | ICD-10-CM | POA: Diagnosis not present

## 2021-11-17 DIAGNOSIS — K219 Gastro-esophageal reflux disease without esophagitis: Secondary | ICD-10-CM | POA: Diagnosis not present

## 2021-11-17 DIAGNOSIS — Z95828 Presence of other vascular implants and grafts: Secondary | ICD-10-CM

## 2021-11-17 LAB — CMP (CANCER CENTER ONLY)
ALT: 11 U/L (ref 0–44)
AST: 12 U/L — ABNORMAL LOW (ref 15–41)
Albumin: 3.8 g/dL (ref 3.5–5.0)
Alkaline Phosphatase: 94 U/L (ref 38–126)
Anion gap: 7 (ref 5–15)
BUN: 10 mg/dL (ref 8–23)
CO2: 28 mmol/L (ref 22–32)
Calcium: 9.5 mg/dL (ref 8.9–10.3)
Chloride: 101 mmol/L (ref 98–111)
Creatinine: 0.83 mg/dL (ref 0.61–1.24)
GFR, Estimated: >60 mL/min (ref >60)
Glucose, Bld: 102 mg/dL — ABNORMAL HIGH (ref 70–99)
Potassium: 4 mmol/L (ref 3.5–5.1)
Sodium: 136 mmol/L (ref 135–145)
Total Bilirubin: 0.6 mg/dL (ref 0.3–1.2)
Total Protein: 7 g/dL (ref 6.5–8.1)

## 2021-11-17 LAB — CBC WITH DIFFERENTIAL (CANCER CENTER ONLY)
Abs Immature Granulocytes: 0.02 10*3/uL (ref 0.00–0.07)
Basophils Absolute: 0 10*3/uL (ref 0.0–0.1)
Basophils Relative: 0 %
Eosinophils Absolute: 0.3 10*3/uL (ref 0.0–0.5)
Eosinophils Relative: 5 %
HCT: 38.2 % — ABNORMAL LOW (ref 39.0–52.0)
Hemoglobin: 12.8 g/dL — ABNORMAL LOW (ref 13.0–17.0)
Immature Granulocytes: 0 %
Lymphocytes Relative: 14 %
Lymphs Abs: 1 10*3/uL (ref 0.7–4.0)
MCH: 31.6 pg (ref 26.0–34.0)
MCHC: 33.5 g/dL (ref 30.0–36.0)
MCV: 94.3 fL (ref 80.0–100.0)
Monocytes Absolute: 0.8 10*3/uL (ref 0.1–1.0)
Monocytes Relative: 11 %
Neutro Abs: 5 10*3/uL (ref 1.7–7.7)
Neutrophils Relative %: 70 %
Platelet Count: 190 10*3/uL (ref 150–400)
RBC: 4.05 MIL/uL — ABNORMAL LOW (ref 4.22–5.81)
RDW: 13.5 % (ref 11.5–15.5)
WBC Count: 7.2 10*3/uL (ref 4.0–10.5)
nRBC: 0 % (ref 0.0–0.2)

## 2021-11-17 LAB — TSH: TSH: 2.934 u[IU]/mL (ref 0.320–4.118)

## 2021-11-17 MED ORDER — SODIUM CHLORIDE 0.9% FLUSH
10.0000 mL | INTRAVENOUS | Status: DC | PRN
  Administered 2021-11-17: 10 mL

## 2021-11-17 MED ORDER — HEPARIN SOD (PORK) LOCK FLUSH 100 UNIT/ML IV SOLN
500.0000 [IU] | Freq: Once | INTRAVENOUS | Status: AC | PRN
  Administered 2021-11-17: 500 [IU]

## 2021-11-17 MED ORDER — SODIUM CHLORIDE 0.9% FLUSH
10.0000 mL | Freq: Once | INTRAVENOUS | Status: AC
  Administered 2021-11-17: 10 mL

## 2021-11-17 MED ORDER — SODIUM CHLORIDE 0.9 % IV SOLN
360.0000 mg | Freq: Once | INTRAVENOUS | Status: AC
  Administered 2021-11-17: 360 mg via INTRAVENOUS
  Filled 2021-11-17: qty 24

## 2021-11-17 MED ORDER — FAMOTIDINE IN NACL 20-0.9 MG/50ML-% IV SOLN
20.0000 mg | Freq: Once | INTRAVENOUS | Status: AC
  Administered 2021-11-17: 20 mg via INTRAVENOUS
  Filled 2021-11-17: qty 50

## 2021-11-17 MED ORDER — DIPHENHYDRAMINE HCL 50 MG/ML IJ SOLN
25.0000 mg | Freq: Once | INTRAMUSCULAR | Status: AC
  Administered 2021-11-17: 25 mg via INTRAVENOUS
  Filled 2021-11-17: qty 1

## 2021-11-17 MED ORDER — SODIUM CHLORIDE 0.9 % IV SOLN
1.0000 mg/kg | Freq: Once | INTRAVENOUS | Status: AC
  Administered 2021-11-17: 100 mg via INTRAVENOUS
  Filled 2021-11-17: qty 20

## 2021-11-17 MED ORDER — SODIUM CHLORIDE 0.9 % IV SOLN: Freq: Once | INTRAVENOUS | Status: AC

## 2021-11-17 NOTE — Progress Notes (Signed)
Ipilimumab (YERVOY) Patient Monitoring Assessment  ? ?Is the patient experiencing any of the following general symptoms?:  ?[] Difficulty performing normal activities ?[] Feeling sluggish or cold all the time ?[] Unusual weight gain ?[] Constant or unusual headaches ?[] Feeling dizzy or faint ?[] Changes in eyesight (blurry vision, double vision, or other vision problems) ?[] Changes in mood or behavior (ex: decreased sex drive, irritability, or forgetfulness) ?[] Starting new medications (ex: steroids, other medications that lower immune response) ?[x] Patient is not experiencing any of the general symptoms above.  ? ? ?Gastrointestinal  ?Patient is having 2 bowel movements each day. (Patient unsure about total but is consistent.) ?Is this different from baseline? [] Yes [x] No ?Are your stools watery or do they have a foul smell? [] Yes [x] No ?Have you seen blood in your stools? [] Yes [x] No ?Are your stools dark, tarry, or sticky? [] Yes [x] No ?Are you having pain or tenderness in your belly? [] Yes [x] No ? ?Skin ?Does your skin itch? [] Yes [x] No ?Do you have a rash? [] Yes [x] No ?Has your skin blistered and/or peeled? [] Yes [x] No ?Do you have sores in your mouth? [x] Yes [] No (Not as bad as when receiving chemotherapy) ? ?Hepatic ?Has your urine been dark or tea colored? [] Yes [x] No ?Have you noticed that your skin or the whites of your eyes are turning yellow? [] Yes [x] No ?Are you bleeding or bruising more easily than normal? [] Yes [x] No ?Are you nauseous and/or vomiting? [] Yes [x] No ?Do you have pain on the right side of your stomach? [] Yes [x] No ? ?Neurologic  ?Are you having unusual weakness of legs, arms, or face? [] Yes [x] No ?Are you having numbness or tingling in your hands or feet? [] Yes [x] No ? ?Per Cassie Heilingoetter, PA okay to continue with treatment with above monitoring results. ? ?Wylene Men  ?

## 2021-11-17 NOTE — Patient Instructions (Signed)
[facility]  Discharge Instructions: ?Thank you for choosing [facility] to provide your oncology and hematology care.  ? ?If you have a lab appointment with the [facility], please go directly to the [facility] and check in at the registration area. ?  ?Wear comfortable clothing and clothing appropriate for easy access to any Portacath or PICC line.  ? ?We strive to give you quality time with your provider. You may need to reschedule your appointment if you arrive late (15 or more minutes).  Arriving late affects you and other patients whose appointments are after yours.  Also, if you miss three or more appointments without notifying the office, you may be dismissed from the clinic at the provider?s discretion.    ?  ?For prescription refill requests, have your pharmacy contact our office and allow 72 hours for refills to be completed.   ? ?Today you received the following chemotherapy and/or immunotherapy agents: Opdivo/Yervoy.    ?  ?To help prevent nausea and vomiting after your treatment, we encourage you to take your nausea medication as directed. ? ?BELOW ARE SYMPTOMS THAT SHOULD BE REPORTED IMMEDIATELY: ?*FEVER GREATER THAN 100.4 F (38 ?C) OR HIGHER ?*CHILLS OR SWEATING ?*NAUSEA AND VOMITING THAT IS NOT CONTROLLED WITH YOUR NAUSEA MEDICATION ?*UNUSUAL SHORTNESS OF BREATH ?*UNUSUAL BRUISING OR BLEEDING ?*URINARY PROBLEMS (pain or burning when urinating, or frequent urination) ?*BOWEL PROBLEMS (unusual diarrhea, constipation, pain near the anus) ?TENDERNESS IN MOUTH AND THROAT WITH OR WITHOUT PRESENCE OF ULCERS (sore throat, sores in mouth, or a toothache) ?UNUSUAL RASH, SWELLING OR PAIN  ?UNUSUAL VAGINAL DISCHARGE OR ITCHING  ? ?Items with * indicate a potential emergency and should be followed up as soon as possible or go to the Emergency Department if any problems should occur. ? ?Please show the CHEMOTHERAPY ALERT CARD or IMMUNOTHERAPY ALERT CARD at  check-in to the Emergency Department and triage nurse. ? ?Should you have questions after your visit or need to cancel or reschedule your appointment, please contact [facility]  Dept: [phone]  and follow the prompts.  Office hours are 8:00 a.m. to 4:30 p.m. Monday - Friday. Please note that voicemails left after 4:00 p.m. may not be returned until the following business day.  We are closed weekends and major holidays. You have access to a nurse at all times for urgent questions. Please call the main number to the clinic Dept: [phone] and follow the prompts. ? ? ?For any non-urgent questions, you may also contact your provider using MyChart. We now offer e-Visits for anyone 34 and older to request care online for non-urgent symptoms. For details visit [email or web address]. ?  ?Also download the MyChart app! Go to the app store, search "MyChart", open the app, select [facility], and log in with your MyChart username and password. ? ?Due to Covid, a mask is required upon entering the hospital/clinic. If you do not have a mask, one will be given to you upon arrival. For doctor visits, patients may have 1 support person aged 29 or older with them. For treatment visits, patients cannot have anyone with them due to current Covid guidelines and our immunocompromised population.  ? ?

## 2021-11-23 IMAGING — DX DG CHEST 2V
2 series · 2 of 2 positions shown · non-contrast
Comparison: PA and lateral chest 03/03/2021.

CLINICAL DATA: Recurrent right pleural effusion.

EXAM:
CHEST - 2 VIEW

[dg chest 2 view (1 of 2)]
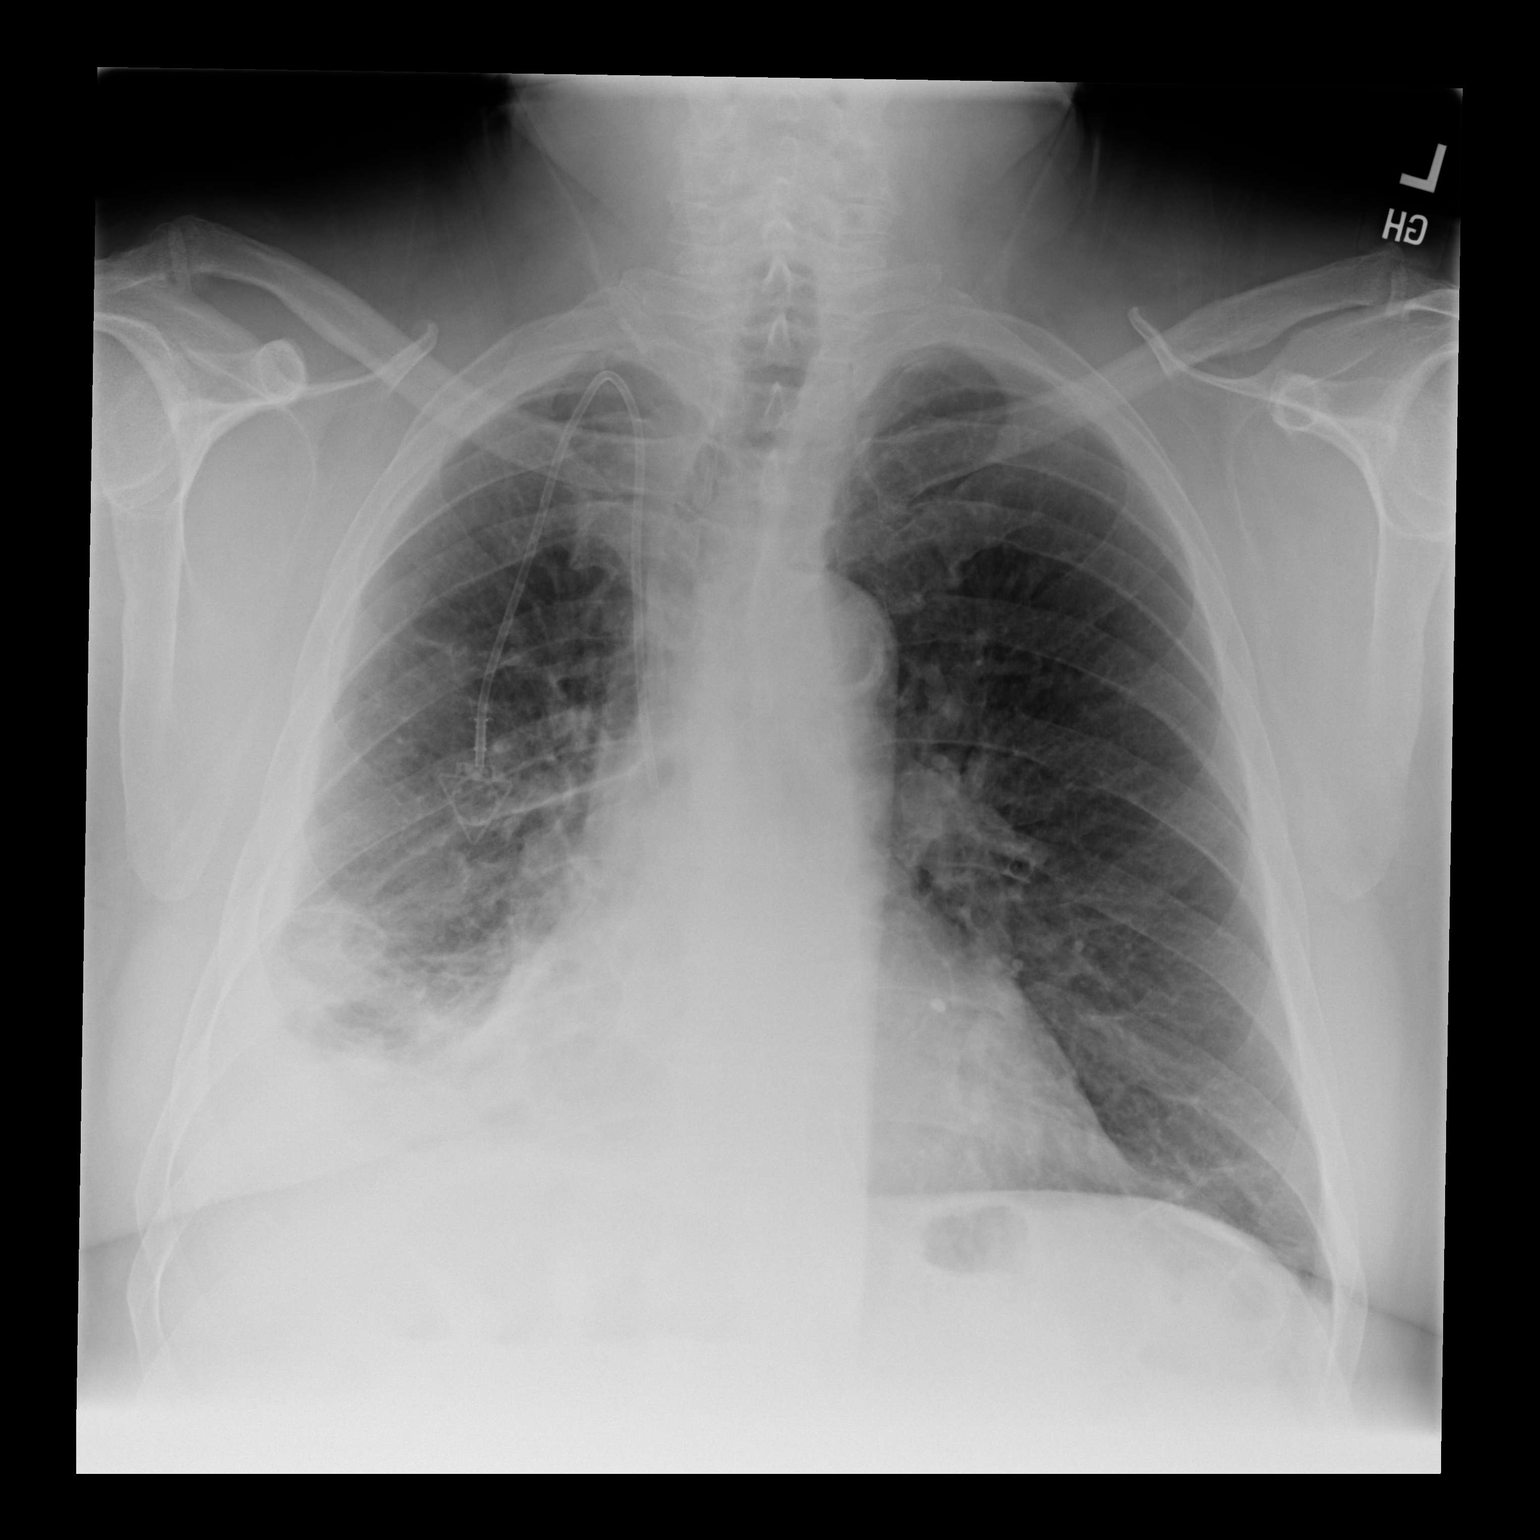

[dg chest 2 view (2 of 2)]
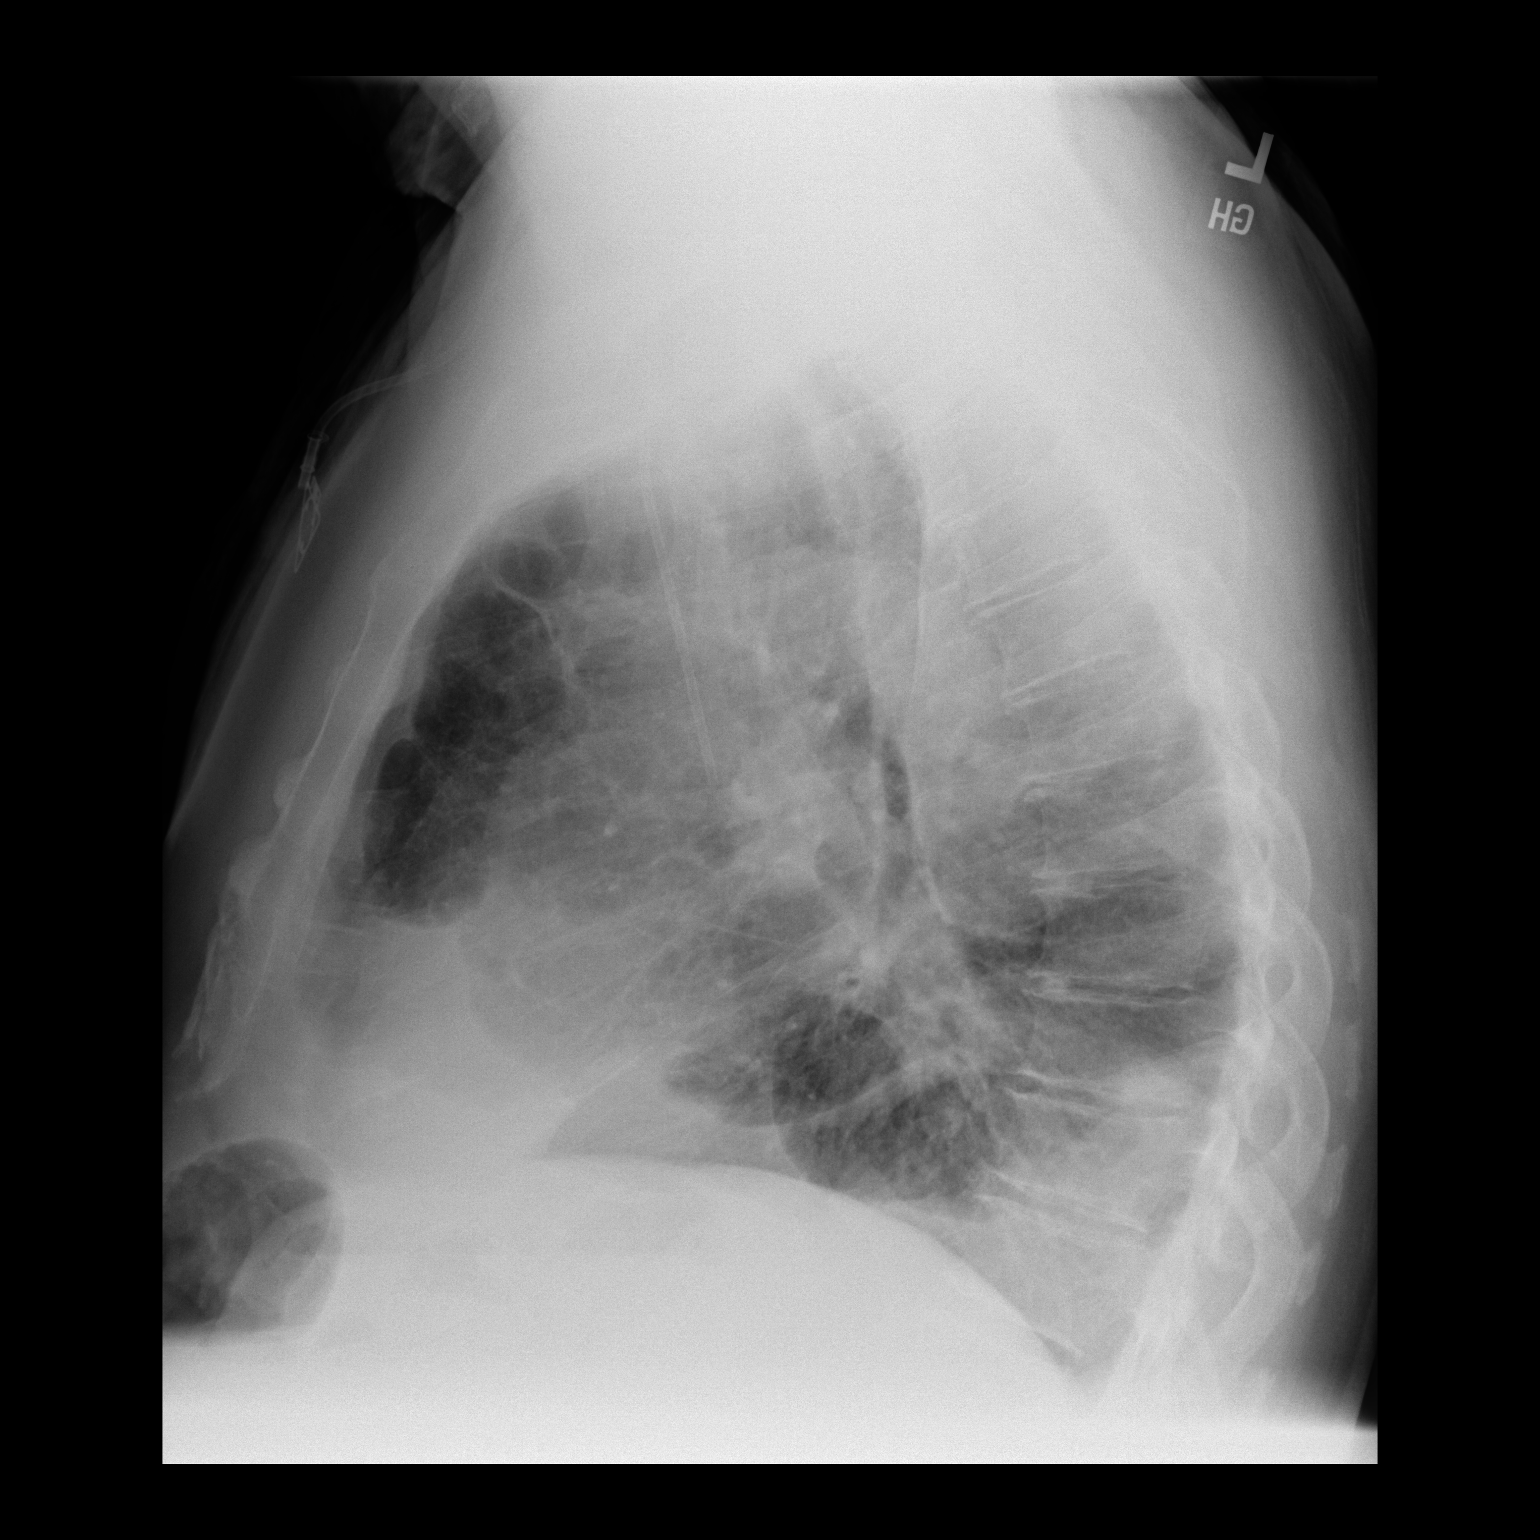

[2 of 2 positions shown; findings below may reference images not displayed]

FINDINGS: New right IJ approach Port-A-Cath tip projects in the lower superior
vena cava. Small right pleural effusion and basilar airspace opacity
are unchanged. The left lung is clear. Heart size is normal. Aortic
atherosclerosis.
IMPRESSION: No change in a small right pleural effusion basilar airspace
opacity.

Right IJ approach Port-A-Cath tip projects in the lower superior
vena cava

## 2021-11-24 ENCOUNTER — Telehealth: Payer: Self-pay | Admitting: Physician Assistant

## 2021-11-30 ENCOUNTER — Encounter: Payer: Self-pay | Admitting: Internal Medicine

## 2021-11-30 ENCOUNTER — Telehealth: Payer: Self-pay

## 2021-11-30 NOTE — Telephone Encounter (Signed)
Referral and records have been re-sent to an alternate fax number for [facility] in [place]. ? ?[place]: [phone] ?[facility]: [phone] ?

## 2021-12-04 ENCOUNTER — Encounter: Payer: Self-pay | Admitting: Internal Medicine

## 2021-12-04 NOTE — Telephone Encounter (Signed)
Pt called stating he spoke with Grier Rocher, New Patient Referrals Coordinator who advised to have his records sent to a different fax number (again). ? ?Referral, progress notes, labs, pathology, cytology and CT and PET scans have all been faxed to [phone]. ?

## 2021-12-07 ENCOUNTER — Telehealth: Payer: Self-pay | Admitting: Pharmacist

## 2021-12-07 NOTE — Chronic Care Management (AMB) (Signed)
? ? ?  Chronic Care Management ?Pharmacy Assistant  ? ?Name: Tkai Serfass  MRN: [id number] DOB: 09-Oct-1950 ? ? ? ? ?Medications: ?Outpatient Encounter Medications as of 12/07/2021  ?Medication Sig  ? acetaminophen (TYLENOL) 500 MG tablet Take 2 tablets (1,000 mg total) by mouth every 6 (six) hours as needed.  ? aspirin 81 MG EC tablet Take 81 mg by mouth daily.  ? atorvastatin (LIPITOR) 80 MG tablet TAKE 1 TABLET BY MOUTH EVERY DAY  ? glucosamine-chondroitin 500-400 MG tablet Take 1 tablet by mouth daily.  ? L-Lysine 1000 MG TABS Take 1-2 tablets by mouth as needed (Daily as needed).  ? lidocaine-prilocaine (EMLA) cream Apply to the Port-A-Cath site 30-60-minute before chemotherapy.  ? magic mouthwash SOLN Take 5 mLs by mouth 3 (three) times daily as needed for mouth pain. Components- Benadryl 12.5 mg/5 ml, Hydrocortisone 60 mg and Nystatin 30 mg  ? omeprazole (PRILOSEC) 20 MG capsule Take 20 mg by mouth daily.  ? OVER THE COUNTER MEDICATION Mushroom super blend (immunity and brain supplement) daily  ? oxymetazoline (AFRIN) 0.05 % nasal spray Place 1 spray into both nostrils as needed for congestion.  ? prochlorperazine (COMPAZINE) 10 MG tablet TAKE 1 TABLET BY MOUTH EVERY 6 HOURS AS NEEDED FOR NAUSEA OR VOMITING.  ? terazosin (HYTRIN) 10 MG capsule TAKE 1 CAPSULE BY MOUTH EVERY DAY  ? Turmeric (QC TUMERIC COMPLEX PO) Take 450 mg by mouth daily.  ? ?No facility-administered encounter medications on file as of 12/07/2021.  ?Notes: ?Confirmed with Patient that he has moved to [place], advised him I would notify [name] and remove from [facility] per [facility] , he was in agreement. ? ?Ned Clines CMA ?Clinical Pharmacist Assistant ?[phone] ? ?

## 2021-12-09 ENCOUNTER — Ambulatory Visit: Payer: Medicare HMO | Admitting: Internal Medicine

## 2021-12-09 ENCOUNTER — Other Ambulatory Visit: Payer: Medicare HMO

## 2021-12-09 ENCOUNTER — Ambulatory Visit: Payer: Medicare HMO

## 2021-12-11 ENCOUNTER — Other Ambulatory Visit: Payer: Medicare HMO

## 2021-12-11 ENCOUNTER — Telehealth: Payer: Self-pay | Admitting: Medical Oncology

## 2021-12-11 ENCOUNTER — Other Ambulatory Visit: Payer: Self-pay | Admitting: Physician Assistant

## 2021-12-11 NOTE — Telephone Encounter (Signed)
Labs and immunotherapy appt changed to 12/15/21. ?

## 2021-12-12 ENCOUNTER — Inpatient Hospital Stay: Payer: Medicare Other

## 2021-12-14 ENCOUNTER — Telehealth: Payer: Self-pay | Admitting: Medical Oncology

## 2021-12-14 NOTE — Telephone Encounter (Signed)
12/11/21 3:30 p-LVM for Guy Evans return my call with pt's appt. ?

## 2021-12-15 ENCOUNTER — Inpatient Hospital Stay: Payer: Medicare Other

## 2021-12-15 ENCOUNTER — Other Ambulatory Visit: Payer: Self-pay

## 2021-12-15 ENCOUNTER — Inpatient Hospital Stay: Payer: Medicare Other | Attending: Internal Medicine | Admitting: Internal Medicine

## 2021-12-15 VITALS — BP 155/73 | HR 73 | Temp 98.3°F | Resp 20 | Ht 68.5 in | Wt 226.7 lb

## 2021-12-15 DIAGNOSIS — Z5112 Encounter for antineoplastic immunotherapy: Secondary | ICD-10-CM | POA: Diagnosis not present

## 2021-12-15 DIAGNOSIS — C450 Mesothelioma of pleura: Secondary | ICD-10-CM

## 2021-12-15 DIAGNOSIS — Z95828 Presence of other vascular implants and grafts: Secondary | ICD-10-CM

## 2021-12-15 DIAGNOSIS — K219 Gastro-esophageal reflux disease without esophagitis: Secondary | ICD-10-CM | POA: Insufficient documentation

## 2021-12-15 DIAGNOSIS — Z79899 Other long term (current) drug therapy: Secondary | ICD-10-CM | POA: Diagnosis not present

## 2021-12-15 DIAGNOSIS — K3184 Gastroparesis: Secondary | ICD-10-CM | POA: Diagnosis not present

## 2021-12-15 DIAGNOSIS — Z8719 Personal history of other diseases of the digestive system: Secondary | ICD-10-CM | POA: Diagnosis not present

## 2021-12-15 DIAGNOSIS — Z7982 Long term (current) use of aspirin: Secondary | ICD-10-CM | POA: Diagnosis not present

## 2021-12-15 DIAGNOSIS — N400 Enlarged prostate without lower urinary tract symptoms: Secondary | ICD-10-CM | POA: Diagnosis not present

## 2021-12-15 DIAGNOSIS — Z8711 Personal history of peptic ulcer disease: Secondary | ICD-10-CM | POA: Diagnosis not present

## 2021-12-15 LAB — CBC WITH DIFFERENTIAL (CANCER CENTER ONLY)
Abs Immature Granulocytes: 0.01 10*3/uL (ref 0.00–0.07)
Basophils Absolute: 0 10*3/uL (ref 0.0–0.1)
Basophils Relative: 1 %
Eosinophils Absolute: 0.4 10*3/uL (ref 0.0–0.5)
Eosinophils Relative: 5 %
HCT: 39.6 % (ref 39.0–52.0)
Hemoglobin: 12.9 g/dL — ABNORMAL LOW (ref 13.0–17.0)
Immature Granulocytes: 0 %
Lymphocytes Relative: 12 %
Lymphs Abs: 0.9 10*3/uL (ref 0.7–4.0)
MCH: 30.3 pg (ref 26.0–34.0)
MCHC: 32.6 g/dL (ref 30.0–36.0)
MCV: 93 fL (ref 80.0–100.0)
Monocytes Absolute: 0.8 10*3/uL (ref 0.1–1.0)
Monocytes Relative: 10 %
Neutro Abs: 5.6 10*3/uL (ref 1.7–7.7)
Neutrophils Relative %: 72 %
Platelet Count: 198 10*3/uL (ref 150–400)
RBC: 4.26 MIL/uL (ref 4.22–5.81)
RDW: 14.1 % (ref 11.5–15.5)
WBC Count: 7.7 10*3/uL (ref 4.0–10.5)
nRBC: 0 % (ref 0.0–0.2)

## 2021-12-15 LAB — CMP (CANCER CENTER ONLY)
ALT: 13 U/L (ref 0–44)
AST: 13 U/L — ABNORMAL LOW (ref 15–41)
Albumin: 3.9 g/dL (ref 3.5–5.0)
Alkaline Phosphatase: 92 U/L (ref 38–126)
Anion gap: 7 (ref 5–15)
BUN: 12 mg/dL (ref 8–23)
CO2: 28 mmol/L (ref 22–32)
Calcium: 9.5 mg/dL (ref 8.9–10.3)
Chloride: 104 mmol/L (ref 98–111)
Creatinine: 0.69 mg/dL (ref 0.61–1.24)
GFR, Estimated: >60 mL/min (ref >60)
Glucose, Bld: 101 mg/dL — ABNORMAL HIGH (ref 70–99)
Potassium: 3.8 mmol/L (ref 3.5–5.1)
Sodium: 139 mmol/L (ref 135–145)
Total Bilirubin: 0.7 mg/dL (ref 0.3–1.2)
Total Protein: 7.3 g/dL (ref 6.5–8.1)

## 2021-12-15 LAB — TSH: TSH: 2.654 u[IU]/mL (ref 0.320–4.118)

## 2021-12-15 MED ORDER — SODIUM CHLORIDE 0.9 % IV SOLN
360.0000 mg | Freq: Once | INTRAVENOUS | Status: AC
  Administered 2021-12-15: 360 mg via INTRAVENOUS
  Filled 2021-12-15: qty 24

## 2021-12-15 MED ORDER — SODIUM CHLORIDE 0.9 % IV SOLN: Freq: Once | INTRAVENOUS | Status: AC

## 2021-12-15 MED ORDER — SODIUM CHLORIDE 0.9% FLUSH
10.0000 mL | Freq: Once | INTRAVENOUS | Status: AC
  Administered 2021-12-15: 10 mL

## 2021-12-15 NOTE — Progress Notes (Signed)
?    [name] ?Telephone:(336) [phone]   [phone] ? ?OFFICE PROGRESS NOTE ? ?Tysinger, Camelia Eng, PA-C ?[address] ?[place] [address] ? ?DIAGNOSIS: Stage IB (T2, N0, M0) unresectable right malignant pleural mesothelioma.,  Epithelioid type diagnosed in June 2022. ? ?PRIOR THERAPY:  ?1) Status post right VATS with pleural biopsy and talc pleurodesis under the care of Dr. Roxan Hockey on 02/27/2021. ?2) Systemic chemotherapy with carboplatin for AUC of 5, Alimta 500 Mg/M2 and Avastin 15 mg/KG every 3 weeks.  First dose March 23, 2021.  Status post 9 cycles.  Starting from cycle #7 he is on treatment with single agent Avastin. ? ?CURRENT THERAPY: Second line treatment with immunotherapy with ipilimumab 1 Mg/KG every 3 weeks in addition to nivolumab 360 Mg IV every 3 weeks.  First dose October 08, 2021.  Status post 1 cycle day 1 of cycle #2. ? ?INTERVAL HISTORY: ?Guy Evans 71 y.o. male returns to the clinic today for follow-up visit.  The patient already moved to [place] and was supposed to have his treatment there but he has not received an appointment to meet with the medical oncologist in the area yet.  He was in [place] for the Easter holiday and he decided to receive his dose of treatment here until he establish care with the medical oncologist in that area.  He is feeling fine except for increasing shortness of breath recently but no significant chest pain, cough or hemoptysis.  He has no nausea, vomiting, diarrhea or constipation.  He has no headache or visual changes. ? ?MEDICAL HISTORY: ?Past Medical History:  ?Diagnosis Date  ? Arthritis   ? hands  ? Enlarged prostate   ? takes Terazosin daily  ? Gastroparesis   ? GERD (gastroesophageal reflux disease)   ? occasionally but no meds required  ? H/O cardiovascular stress test 10/2019  ? nuclear, normal, Dr. Fransico Him  ? History of colon polyps   ? History of gastric ulcer   ? Joint pain   ? knees and ankles   ? Urinary frequency   ? ? ?ALLERGIES:  has No Known Allergies. ? ?MEDICATIONS:  ?Current Outpatient Medications  ?Medication Sig Dispense Refill  ? acetaminophen (TYLENOL) 500 MG tablet Take 2 tablets (1,000 mg total) by mouth every 6 (six) hours as needed. 30 tablet 0  ? aspirin 81 MG EC tablet Take 81 mg by mouth daily.    ? atorvastatin (LIPITOR) 80 MG tablet TAKE 1 TABLET BY MOUTH EVERY DAY 90 tablet 1  ? glucosamine-chondroitin 500-400 MG tablet Take 1 tablet by mouth daily.    ? L-Lysine 1000 MG TABS Take 1-2 tablets by mouth as needed (Daily as needed).    ? lidocaine-prilocaine (EMLA) cream Apply to the Port-A-Cath site 30-60-minute before chemotherapy. 30 g 0  ? magic mouthwash SOLN Take 5 mLs by mouth 3 (three) times daily as needed for mouth pain. Components- Benadryl 12.5 mg/5 ml, Hydrocortisone 60 mg and Nystatin 30 mg 240 mL 1  ? omeprazole (PRILOSEC) 20 MG capsule Take 20 mg by mouth daily.    ? OVER THE COUNTER MEDICATION Mushroom super blend (immunity and brain supplement) daily    ? oxymetazoline (AFRIN) 0.05 % nasal spray Place 1 spray into both nostrils as needed for congestion.    ? prochlorperazine (COMPAZINE) 10 MG tablet TAKE 1 TABLET BY MOUTH EVERY 6 HOURS AS NEEDED FOR NAUSEA OR VOMITING. 30 tablet 0  ? terazosin (HYTRIN) 10 MG capsule TAKE  1 CAPSULE BY MOUTH EVERY DAY 90 capsule 0  ? Turmeric (QC TUMERIC COMPLEX PO) Take 450 mg by mouth daily.    ? ?No current facility-administered medications for this visit.  ? ? ?SURGICAL HISTORY:  ?Past Surgical History:  ?Procedure Laterality Date  ? CHOLECYSTECTOMY N/A 11/19/2013  ? Procedure: LAPAROSCOPIC CHOLECYSTECTOMY;  Surgeon: Harl Bowie, MD;  Location: [facility];  Service: General;  Laterality: N/A;  ? COLONOSCOPY    ? DIAGNOSTIC LAPAROSCOPY    ? lap chole  ? ESOPHAGOGASTRODUODENOSCOPY    ? IR IMAGING GUIDED PORT INSERTION  03/11/2021  ? skin spots removed and tested    ? done in the office  ? THORACENTESIS N/A 01/14/2021  ? Procedure:  THORACENTESIS;  Surgeon: Collene Gobble, MD;  Location[facility] ENDOSCOPY;  Service: Cardiopulmonary;  Laterality: N/A;  ? THORACENTESIS N/A 01/23/2021  ? Procedure: THORACENTESIS;  Surgeon: Collene Gobble, MD;  Location: [facility] ENDOSCOPY;  Service: Cardiopulmonary;  Laterality: N/A;  ? VIDEO ASSISTED THORACOSCOPY (VATS) W/TALC PLEUADESIS Right 02/27/2021  ? Procedure: VIDEO ASSISTED THORACOSCOPY (VATS) WITH  TALC PLEUADESIS and PLEURAL BIOPSY;  Surgeon: Melrose Nakayama, MD;  Location: [facility];  Service: Thoracic;  Laterality: Right;  ? ? ?REVIEW OF SYSTEMS:  A comprehensive review of systems was negative except for: Respiratory: positive for dyspnea on exertion  ? ?PHYSICAL EXAMINATION: General appearance: alert, cooperative, and no distress ?Head: Normocephalic, without obvious abnormality, atraumatic ?Neck: no adenopathy, no JVD, supple, symmetrical, trachea midline, and thyroid not enlarged, symmetric, no tenderness/mass/nodules ?Lymph nodes: Cervical, supraclavicular, and axillary nodes normal. ?Resp: clear to auscultation bilaterally ?Back: symmetric, no curvature. ROM normal. No CVA tenderness. ?Cardio: regular rate and rhythm, S1, S2 normal, no murmur, click, rub or gallop ?GI: soft, non-tender; bowel sounds normal; no masses,  no organomegaly ?Extremities: extremities normal, atraumatic, no cyanosis or edema ? ?ECOG PERFORMANCE STATUS: 1 - Symptomatic but completely ambulatory ? ?Blood pressure (!) 155/73, pulse 73, temperature 98.3 ?F (36.8 ?C), temperature source Oral, resp. rate 20, height 5' 8.5" (1.74 m), weight 226 lb 11.2 oz (102.8 kg), SpO2 95 %. ? ?LABORATORY DATA: ?Lab Results  ?Component Value Date  ? WBC 7.7 12/15/2021  ? HGB 12.9 (L) 12/15/2021  ? HCT 39.6 12/15/2021  ? MCV 93.0 12/15/2021  ? PLT 198 12/15/2021  ? ? ?  Chemistry   ?   ?Component Value Date/Time  ? NA 136 11/17/2021 1109  ? NA 141 10/20/2020 1030  ? K 4.0 11/17/2021 1109  ? CL 101 11/17/2021 1109  ? CO2 28 11/17/2021 1109  ? BUN 10  11/17/2021 1109  ? BUN 13 10/20/2020 1030  ? CREATININE 0.83 11/17/2021 1109  ? CREATININE 1.03 10/18/2016 1027  ?    ?Component Value Date/Time  ? CALCIUM 9.5 11/17/2021 1109  ? ALKPHOS 94 11/17/2021 1109  ? AST 12 (L) 11/17/2021 1109  ? ALT 11 11/17/2021 1109  ? BILITOT 0.6 11/17/2021 1109  ?  ? ? ? ?RADIOGRAPHIC STUDIES: ?No results found. ? ?ASSESSMENT AND PLAN: This is a very pleasant 71 years old white male recently diagnosed with unresectable stage IB (T2, N0, M0) malignant pleural mesothelioma, epithelioid type diagnosed and June 2022 and presented with extensive involvement of the right hemithorax. ?The patient is status post VATS with pleural biopsies and talc pleurodesis in June 2022 under the care of Dr. Roxan Hockey. ?The patient started systemic chemotherapy with carboplatin for AUC of 5, Alimta 500 Mg/M2 and Avastin 15 Mg/KG every 3 weeks.  First dose  March 23, 2021.  Status post 9 cycles.  Starting from cycle #7 the patient is on maintenance treatment with single agent Avastin every 3 weeks.   ?He has been tolerating his treatment with systemic chemotherapy and Avastin fairly well. ?He had repeat CT scan of the chest performed recently.  I personally and independently reviewed the scan images and discussed the result and showed the images to the patient and his wife. ?Unfortunately his scan showed interval disease progression with progressive pleural studding. ?I recommended for the patient to discontinue his treatment with Avastin at this point. ?He is currently on treatment with immunotherapy with ipilimumab 1 Mg/KG every 6 weeks in addition to nivolumab 350 mg IV every 3 weeks until disease progression or unacceptable toxicity.   ?He is status post 1 cycle and day 1 of cycle #2. ?I recommended for the patient to proceed with day 22 of cycle #2 today as planned. ?He is supposed to have repeat CT scan of the chest in 3 weeks for restaging of his disease after completion of cycle #2. ?The patient  already moved to [place] but he has not establish care with a medical oncologist in the area..  The referral was done many weeks ago and all his records were faxed to that facility. ?He will reach out

## 2021-12-15 NOTE — Patient Instructions (Signed)
[facility] MEDICAL ONCOLOGY  Discharge Instructions: ?Thank you for choosing [facility] to provide your oncology and hematology care.  ? ?If you have a lab appointment with the Cancer Center, please go directly to the Cancer Center and check in at the registration area. ?  ?Wear comfortable clothing and clothing appropriate for easy access to any Portacath or PICC line.  ? ?We strive to give you quality time with your provider. You may need to reschedule your appointment if you arrive late (15 or more minutes).  Arriving late affects you and other patients whose appointments are after yours.  Also, if you miss three or more appointments without notifying the office, you may be dismissed from the clinic at the provider?s discretion.    ?  ?For prescription refill requests, have your pharmacy contact our office and allow 72 hours for refills to be completed.   ? ?Today you received the following chemotherapy and/or immunotherapy agents Opdivo    ?  ?To help prevent nausea and vomiting after your treatment, we encourage you to take your nausea medication as directed. ? ?BELOW ARE SYMPTOMS THAT SHOULD BE REPORTED IMMEDIATELY: ?*FEVER GREATER THAN 100.4 F (38 ?C) OR HIGHER ?*CHILLS OR SWEATING ?*NAUSEA AND VOMITING THAT IS NOT CONTROLLED WITH YOUR NAUSEA MEDICATION ?*UNUSUAL SHORTNESS OF BREATH ?*UNUSUAL BRUISING OR BLEEDING ?*URINARY PROBLEMS (pain or burning when urinating, or frequent urination) ?*BOWEL PROBLEMS (unusual diarrhea, constipation, pain near the anus) ?TENDERNESS IN MOUTH AND THROAT WITH OR WITHOUT PRESENCE OF ULCERS (sore throat, sores in mouth, or a toothache) ?UNUSUAL RASH, SWELLING OR PAIN  ?UNUSUAL VAGINAL DISCHARGE OR ITCHING  ? ?Items with * indicate a potential emergency and should be followed up as soon as possible or go to the Emergency Department if any problems should occur. ? ?Please show the CHEMOTHERAPY ALERT CARD or IMMUNOTHERAPY ALERT CARD at check-in to the  Emergency Department and triage nurse. ? ?Should you have questions after your visit or need to cancel or reschedule your appointment, please contact [facility]  Dept: [phone]  and follow the prompts.  Office hours are 8:00 a.m. to 4:30 p.m. Monday - Friday. Please note that voicemails left after 4:00 p.m. may not be returned until the following business day.  We are closed weekends and major holidays. You have access to a nurse at all times for urgent questions. Please call the main number to the clinic Dept: [phone] and follow the prompts. ? ? ?For any non-urgent questions, you may also contact your provider using MyChart. We now offer e-Visits for anyone 18 and older to request care online for non-urgent symptoms. For details visit [email or web address]. ?  ?Also download the MyChart app! Go to the app store, search "MyChart", open the app, select , and log in with your MyChart username and password. ? ?Due to Covid, a mask is required upon entering the hospital/clinic. If you do not have a mask, one will be given to you upon arrival. For doctor visits, patients may have 1 support person aged 18 or older with them. For treatment visits, patients cannot have anyone with them due to current Covid guidelines and our immunocompromised population.  ? ?

## 2021-12-21 ENCOUNTER — Encounter: Payer: Self-pay | Admitting: Medical Oncology

## 2021-12-21 ENCOUNTER — Ambulatory Visit: Payer: Medicare Other | Admitting: Thoracic Surgery (Cardiothoracic Vascular Surgery)

## 2021-12-21 ENCOUNTER — Other Ambulatory Visit: Payer: Self-pay

## 2021-12-21 ENCOUNTER — Other Ambulatory Visit: Payer: Self-pay | Admitting: Medical Oncology

## 2021-12-21 ENCOUNTER — Telehealth: Payer: Self-pay

## 2021-12-21 ENCOUNTER — Ambulatory Visit: Payer: Medicare Other | Attending: Medical Oncology | Admitting: Medical Oncology

## 2021-12-21 VITALS — BP 133/77 | HR 64 | Temp 98.1°F | Resp 17 | Ht 68.5 in | Wt 224.8 lb

## 2021-12-21 DIAGNOSIS — Z1329 Encounter for screening for other suspected endocrine disorder: Secondary | ICD-10-CM | POA: Insufficient documentation

## 2021-12-21 DIAGNOSIS — C459 Mesothelioma, unspecified: Secondary | ICD-10-CM | POA: Insufficient documentation

## 2021-12-21 DIAGNOSIS — Z79899 Other long term (current) drug therapy: Secondary | ICD-10-CM

## 2021-12-21 DIAGNOSIS — C450 Mesothelioma of pleura: Secondary | ICD-10-CM

## 2021-12-21 MED ORDER — OXYCODONE HCL 5 MG PO TABS
5.0000 mg | ORAL_TABLET | ORAL | 0 refills | Status: DC | PRN
Start: 2021-12-21 — End: 2022-02-21

## 2021-12-21 NOTE — Progress Notes (Signed)
Dr Fernanda Drum  Auth  Epithelioid mesothelioma  C45.9  nivolumab 360 mg every 3 weeks and ipilimumab 1 mg/kg every 6 weeks  [facility]  12/28/21

## 2021-12-21 NOTE — Progress Notes (Signed)
Medical Oncology    Date: 12/21/2021  Patient Name: [name] [name],[name] [name]  Physician: Judieth Keens, MD,   Chief complaint: Epithelioid mesothelioma    History of Present Illness:   [name] [name] is a 71 y.o. 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 His oncological history is as follows:  01/13/2021: Sought medical attention for cough.  CT of the chest showed large right-sided pleural effusion with compressive atelectasis of the right lung with mediastinal shift of contents to the left.  02/05/2021: PET/CT showed rind of hypermetabolic pleural thickening within the right hemithorax for example in the right middle lobe measuring 10 mm with SUV of 9.  Large right pleural effusion with passive atelectasis.  No evidence of distant metastases.  02/27/2021: Underwent right VATS pleural biopsy and talc pleurodesis.  Pathology showed tumor characterized by invasive sheets of epithelioid cells with malignant features in a fibrotic stroma.  Morphology and immunophenotype consistent with epithelioid mesothelioma.  03/23/2021: Initiated carboplatin pemetrexed and bevacizumab.  Following 6 cycles of carboplatin pemetrexed he was maintained on bevacizumab maintenance.  05/21/2021: CT of the chest showed numerous pleural nodules.  No mediastinal or hilar adenopathy.  Emphysematous changes.  09/06/2021: CT of the chest showed pleural studding progression.  10/08/2021: Initiated the second line treatment with ipilimumab and nivolumab.  12/21/2021: Transferred care to [facility].   Interval history   [name] [name] reports cough and right shoulder pain as his main symptoms.  He denies any side effects from the immunotherapy.  He otherwise reports being healthy and has a past medical history of hyperlipidemia, osteoarthritis, GERD.  He lives with his wife who has active mental health disorders.  Is in [place].  He has 1 son that lives close by and another  son that lives in [place].  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  He could he quit smoking in 1990 after 30 pack year.    Past Medical History:     Past Medical History:   Diagnosis Date    Hyperlipidemia        Past Surgical History:     Past Surgical History:   Procedure Laterality Date    CHOLECYSTECTOMY         Family History:     Family History   Problem Relation Age of Onset    Dementia Mother     Angina Father     Heart attack Father        Social History:     Social History     Socioeconomic History    Marital status: Married   Tobacco Use    Smoking status: Former     Packs/day: 1.50     Years: 20.00     Pack years: 30.00     Types: Cigarettes, Cigars     Quit date: 1990     Years since quitting: 33.3    Smokeless tobacco: Never   Vaping Use    Vaping status: Never Used   Substance and Sexual Activity    Alcohol use: Yes    Drug use: Yes     Frequency: 4.0 times per week     Types: Marijuana       Allergies:   No Known Allergies    Medications:     Current Outpatient Medications   Medication Sig Dispense Refill    acetaminophen (TYLENOL) 500 MG tablet Take 1,000 mg by mouth every 6 (six) hours  as needed      aspirin 81 MG EC tablet Take 81 mg by mouth daily      atorvastatin (LIPITOR) 80 MG tablet Take 1 tablet by mouth daily      glucosamine-chondroitin 500-400 MG tablet Take 1 tablet by mouth daily      L-Lysine 1000 MG Tab Take 1-2 tablets by mouth      lidocaine-prilocaine (EMLA) cream Apply to the Port-A-Cath site 30-60-minute before chemotherapy.      Oxymetazoline HCl (Nasal Spray) 0.05 % Solution 1 spray by Nasal route      prochlorperazine (COMPAZINE) 10 MG tablet TAKE 1 TABLET BY MOUTH EVERY 6 HOURS AS NEEDED FOR NAUSEA OR VOMITING.      terazosin (HYTRIN) 10 MG capsule Take 1 capsule by mouth daily      UNABLE TO FIND Mushroom super blend (immunity and brain supplement) daily       No current facility-administered medications for this visit.        Review of Systems:   All other systems reviewed and are negative.    Physical Exam:     Vitals:    12/21/21 1101   BP: 133/77   Pulse: 64   Resp: 17   Temp: 98.1 F (36.7 C)   SpO2: 93%       ECOG performance status: 1  Well appearing 71 year old no acute distress  Mood and judgment appropriate  Respiratory effort normal  Cranial nerves grossly intact      Radiology:   Per HPI    Pathology:   Per HPI    Assessment & Plan:   [name] is a 71 y.o. 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    [name] [name] is a transferring care from [facility] and has been on second line treatment with ipilimumab and nivolumab for unresectable epithelioid mesothelioma.  We will obtain updated imaging to determine disease status.  Thankfully he is minimally symptomatic with right shoulder pain and cough for which we have prescribed guaifenesin with codeine and oxycodone as needed.  We discussed referral to the [facility] of health for consideration of mesothelin CAR-T clinical trial and I have reached out to nurse coordinator.      # Plan:  Resume ipilimumab nivolumab  Restaging PET/CT    #Followup:   MD visit with ipilimumab and nivolumab infusion      Judieth Keens, MD  Thoracic/Sarcoma Medical Oncology      A total of 60 minutes were spent face-to-face with the patient during this encounter as well as coordinating care.

## 2021-12-21 NOTE — Telephone Encounter (Signed)
Auth  Epithelioid mesothelioma  C45.9  nivolumab 360 mg every 3 weeks and ipilimumab 1 mg/kg every 6 weeks  [facility]  12/28/21

## 2021-12-22 ENCOUNTER — Other Ambulatory Visit: Payer: Self-pay

## 2021-12-22 ENCOUNTER — Encounter: Payer: Self-pay | Admitting: Medical Oncology

## 2021-12-22 DIAGNOSIS — C450 Mesothelioma of pleura: Secondary | ICD-10-CM

## 2021-12-23 ENCOUNTER — Other Ambulatory Visit: Payer: Self-pay | Admitting: Medical Oncology

## 2021-12-23 DIAGNOSIS — Z1329 Encounter for screening for other suspected endocrine disorder: Secondary | ICD-10-CM

## 2021-12-23 DIAGNOSIS — Z79899 Other long term (current) drug therapy: Secondary | ICD-10-CM

## 2021-12-23 DIAGNOSIS — C459 Mesothelioma, unspecified: Secondary | ICD-10-CM

## 2021-12-23 NOTE — Telephone Encounter (Signed)
Next cycle due after 5/2. Patient scheduled for 5/3. Aware of appointments. Verbalized understanding.

## 2021-12-24 MED ORDER — ONDANSETRON HCL 8 MG PO TABS
ORAL_TABLET | ORAL | 1 refills | Status: DC
Start: 2022-01-05 — End: 2022-05-13

## 2021-12-25 ENCOUNTER — Other Ambulatory Visit: Payer: Self-pay

## 2021-12-29 ENCOUNTER — Ambulatory Visit: Payer: Medicare Other

## 2021-12-30 ENCOUNTER — Other Ambulatory Visit: Payer: Medicare HMO

## 2021-12-30 ENCOUNTER — Ambulatory Visit: Payer: Medicare HMO

## 2021-12-30 ENCOUNTER — Ambulatory Visit: Payer: Medicare HMO | Admitting: Physician Assistant

## 2022-01-01 ENCOUNTER — Other Ambulatory Visit: Payer: Self-pay

## 2022-01-05 ENCOUNTER — Other Ambulatory Visit: Payer: Self-pay

## 2022-01-06 ENCOUNTER — Other Ambulatory Visit: Payer: Medicare Other

## 2022-01-06 ENCOUNTER — Ambulatory Visit: Payer: Medicare Other | Attending: Medical Oncology

## 2022-01-06 ENCOUNTER — Ambulatory Visit (INDEPENDENT_AMBULATORY_CARE_PROVIDER_SITE_OTHER): Payer: Medicare Other | Admitting: Medical Oncology

## 2022-01-06 VITALS — BP 150/89 | HR 69 | Temp 98.1°F | Wt 226.0 lb

## 2022-01-06 DIAGNOSIS — C459 Mesothelioma, unspecified: Secondary | ICD-10-CM

## 2022-01-06 DIAGNOSIS — Z1329 Encounter for screening for other suspected endocrine disorder: Secondary | ICD-10-CM | POA: Insufficient documentation

## 2022-01-06 DIAGNOSIS — Z5112 Encounter for antineoplastic immunotherapy: Secondary | ICD-10-CM | POA: Insufficient documentation

## 2022-01-06 DIAGNOSIS — Z9189 Other specified personal risk factors, not elsewhere classified: Secondary | ICD-10-CM

## 2022-01-06 DIAGNOSIS — Z79899 Other long term (current) drug therapy: Secondary | ICD-10-CM | POA: Insufficient documentation

## 2022-01-06 LAB — CBC AND DIFFERENTIAL
Absolute NRBC: 0 10*3/uL (ref 0.00–0.00)
Basophils Absolute Automated: 0.05 10*3/uL (ref 0.00–0.08)
Basophils Automated: 0.6 %
Eosinophils Absolute Automated: 0.38 10*3/uL (ref 0.00–0.44)
Eosinophils Automated: 4.7 %
Hematocrit: 36.5 % — ABNORMAL LOW (ref 37.6–49.6)
Hgb: 12.2 g/dL — ABNORMAL LOW (ref 12.5–17.1)
Immature Granulocytes Absolute: 0.02 10*3/uL (ref 0.00–0.07)
Immature Granulocytes: 0.2 %
Instrument Absolute Neutrophil Count: 5.76 10*3/uL (ref 1.10–6.33)
Lymphocytes Absolute Automated: 1.04 10*3/uL (ref 0.42–3.22)
Lymphocytes Automated: 12.7 %
MCH: 31 pg (ref 25.1–33.5)
MCHC: 33.4 g/dL (ref 31.5–35.8)
MCV: 92.6 fL (ref 78.0–96.0)
MPV: 10.1 fL (ref 8.9–12.5)
Monocytes Absolute Automated: 0.92 10*3/uL — ABNORMAL HIGH (ref 0.21–0.85)
Monocytes: 11.3 %
Neutrophils Absolute: 5.76 10*3/uL (ref 1.10–6.33)
Neutrophils: 70.5 %
Nucleated RBC: 0 /100 WBC (ref 0.0–0.0)
Platelets: 194 10*3/uL (ref 142–346)
RBC: 3.94 10*6/uL — ABNORMAL LOW (ref 4.20–5.90)
RDW: 14 % (ref 11–15)
WBC: 8.17 10*3/uL (ref 3.10–9.50)

## 2022-01-06 LAB — COMPREHENSIVE METABOLIC PANEL
ALT: 8 U/L (ref 0–55)
AST (SGOT): 10 U/L (ref 5–41)
Albumin/Globulin Ratio: 0.9 (ref 0.9–2.2)
Albumin: 3.3 g/dL — ABNORMAL LOW (ref 3.5–5.0)
Alkaline Phosphatase: 103 U/L (ref 37–117)
Anion Gap: 7 (ref 5.0–15.0)
BUN: 12 mg/dL (ref 9.0–28.0)
Bilirubin, Total: 0.5 mg/dL (ref 0.2–1.2)
CO2: 29 mEq/L (ref 17–29)
Calcium: 9.6 mg/dL (ref 7.9–10.2)
Chloride: 103 mEq/L (ref 99–111)
Creatinine: 0.8 mg/dL (ref 0.5–1.5)
Globulin: 3.8 g/dL — ABNORMAL HIGH (ref 2.0–3.6)
Glucose: 110 mg/dL — ABNORMAL HIGH (ref 70–100)
Potassium: 4.1 mEq/L (ref 3.5–5.3)
Protein, Total: 7.1 g/dL (ref 6.0–8.3)
Sodium: 139 mEq/L (ref 135–145)

## 2022-01-06 LAB — GFR: EGFR: >60

## 2022-01-06 LAB — HEMOLYSIS INDEX: Hemolysis Index: 0 Index (ref 0–24)

## 2022-01-06 MED ORDER — SODIUM CHLORIDE 0.9 % IV SOLN
360.0000 mg | Freq: Once | INTRAVENOUS | Status: AC
Start: 2022-01-06 — End: 2022-01-06
  Administered 2022-01-06: 360 mg via INTRAVENOUS
  Filled 2022-01-06: qty 24

## 2022-01-06 MED ORDER — SODIUM CHLORIDE 0.9 % IV SOLN
250.0000 mL | INTRAVENOUS | Status: DC
Start: 2022-01-06 — End: 2022-01-06
  Administered 2022-01-06: 250 mL via INTRAVENOUS

## 2022-01-06 MED ORDER — SODIUM CHLORIDE 0.9 % IV SOLN
100.0000 mg | Freq: Once | INTRAVENOUS | Status: AC
Start: 2022-01-06 — End: 2022-01-06
  Administered 2022-01-06: 100 mg via INTRAVENOUS
  Filled 2022-01-06: qty 20

## 2022-01-06 NOTE — Progress Notes (Signed)
Medical Oncology    Date: 01/06/2022  Patient Name: [name] [name],[name] [name]  Physician: Judieth Keens, MD,   Chief complaint: Epithelioid mesothelioma    History of Present Illness:   [name] [name] is a 71 y.o. 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  His oncological history is as follows:  01/13/2021: Sought medical attention for cough.  CT of the chest showed large right-sided pleural effusion with compressive atelectasis of the right lung with mediastinal shift of contents to the left.  02/05/2021: PET/CT showed rind of hypermetabolic pleural thickening within the right hemithorax for example in the right middle lobe measuring 10 mm with SUV of 9.  Large right pleural effusion with passive atelectasis.  No evidence of distant metastases.  02/27/2021: Underwent right VATS pleural biopsy and talc pleurodesis.  Pathology showed tumor characterized by invasive sheets of epithelioid cells with malignant features in a fibrotic stroma.  Morphology and immunophenotype consistent with epithelioid mesothelioma.  03/23/2021: Initiated carboplatin pemetrexed and bevacizumab.  Following 6 cycles of carboplatin pemetrexed he was maintained on bevacizumab maintenance.  05/21/2021: CT of the chest showed numerous pleural nodules.  No mediastinal or hilar adenopathy.  Emphysematous changes.  09/06/2021: CT of the chest showed pleural studding progression.  10/08/2021: Initiated the second line treatment with ipilimumab and nivolumab.  12/21/2021: Transferred care to [facility].   Interval history   [name] [name] reports feeling well.  He denies any shortness of breath cough abdominal pain.        Past Medical History:     Past Medical History:   Diagnosis Date    Hyperlipidemia        Past Surgical History:     Past Surgical History:   Procedure Laterality Date    CHOLECYSTECTOMY         Family History:     Family History   Problem Relation Age of Onset    Dementia Mother     Angina Father     Heart  attack Father        Social History:     Social History     Socioeconomic History    Marital status: Married   Tobacco Use    Smoking status: Former     Packs/day: 1.50     Years: 20.00     Pack years: 30.00     Types: Cigarettes, Cigars     Quit date: 1990     Years since quitting: 33.3    Smokeless tobacco: Never   Vaping Use    Vaping status: Never Used   Substance and Sexual Activity    Alcohol use: Yes    Drug use: Yes     Frequency: 4.0 times per week     Types: Marijuana       Allergies:   No Known Allergies    Medications:     Current Outpatient Medications   Medication Sig Dispense Refill    acetaminophen (TYLENOL) 500 MG tablet Take 1,000 mg by mouth every 6 (six) hours as needed      aspirin 81 MG EC tablet Take 81 mg by mouth daily      atorvastatin (LIPITOR) 80 MG tablet Take 1 tablet by mouth daily      glucosamine-chondroitin 500-400 MG tablet Take 1 tablet by mouth daily      L-Lysine 1000 MG Tab Take 1-2 tablets by mouth      lidocaine-prilocaine (EMLA) cream Apply to the Port-A-Cath site 30-60-minute before chemotherapy.  ondansetron (ZOFRAN) 8 MG tablet Take 1 tablet (8 mg) by mouth every 8 hours as needed for nausea and/or vomiting. 30 tablet 1    oxyCODONE (ROXICODONE) 5 MG immediate release tablet Take 1 tablet (5 mg) by mouth every 4 (four) hours as needed for Pain 90 tablet 0    Oxymetazoline HCl (Nasal Spray) 0.05 % Solution 1 spray by Nasal route      prochlorperazine (COMPAZINE) 10 MG tablet TAKE 1 TABLET BY MOUTH EVERY 6 HOURS AS NEEDED FOR NAUSEA OR VOMITING.      terazosin (HYTRIN) 10 MG capsule Take 1 capsule by mouth daily      UNABLE TO FIND Mushroom super blend (immunity and brain supplement) daily       No current facility-administered medications for this visit.     Facility-Administered Medications Ordered in Other Visits   Medication Dose Route Frequency Provider Last Rate Last Admin    0.9% NaCl infusion  250 mL Intravenous Continuous Sherida Dobkins, MD        ipilimumab  (YERVOY) 100 mg in sodium chloride 0.9 % 100 mL infusion  100 mg Intravenous Once Milagros Middendorf, MD        nivolumab (OPDIVO) 360 mg in sodium chloride 0.9 % 99 mL infusion  360 mg Intravenous Once Renaud Celli, Nelson ChimesAmin, MD           Review of Systems:   All other systems reviewed and are negative.    Physical Exam:     Vitals:    01/06/22 1348   BP: 148/78   Pulse: 87   Temp: 98.1 F (36.7 C)   SpO2: 94%       ECOG performance status: 0  Well-appearing 71 year old male no acute distress  Mood and judgment appropriate  Respiratory effort normal  Cranial nerves grossly intact  Laboratory:     Lab Results   Component Value Date    WBC 8.17 01/06/2022    RBC 3.94 (L) 01/06/2022    HGB 12.2 (L) 01/06/2022    HCT 36.5 (L) 01/06/2022    MCV 92.6 01/06/2022    MCH 31.0 01/06/2022    MCHC 33.4 01/06/2022    RDW 14 01/06/2022    PLT 194 01/06/2022    MPV 10.1 01/06/2022       Lab Results   Component Value Date    NEUTRO 70.5 01/06/2022     No results found for: RETICCTPCT  No results found for: IRON, FERRITIN, TIBC  No results found for: FOLATE  No results found for: HAPTOGLOBIN, LDH    Lab Results   Component Value Date    GLU 110 (H) 01/06/2022    BUN 12.0 01/06/2022    EGFR >60.0 01/06/2022    NA 139 01/06/2022    K 4.1 01/06/2022    CL 103 01/06/2022    CO2 29 01/06/2022    PROT 7.1 01/06/2022    ALB 3.3 (L) 01/06/2022    GLOB 3.8 (H) 01/06/2022    ALKPHOS 103 01/06/2022    AST 10 01/06/2022    ALT 8 01/06/2022         Radiology:   Per HPI    Pathology:   Per HPI    Assessment & Plan:   [name] [name] is a 71 y.o. 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    #Encounter for management of epithelioid mesothelioma:  [name] [name] is tolerating treatment well.  He has a consultation at [facility]  later this month for evaluation of clinical trials.    #Management of antineoplastic side effects:  Reviewed labs.  Tolerating ipilimumab and nivolumab without evidence of immune related  adverse events    # Plan:  Continue ipilimumab nivolumab  [facility] consultation    #Followup:   MD visit 01/29/2022      Judieth Keens, MD  Thoracic/Sarcoma Medical Oncology      Billing:  Level 5 f/u  Number of problems addressed: 1 or more chronic illness with severe exacerbation, progression or side effects of treatment  Risk of complications: Drug therapy requiring intensive monitoring for toxicity

## 2022-01-06 NOTE — Progress Notes (Signed)
[facility] - Adult Infusion   Visit Date: 01/06/2022        [name] [name] is a 71 y.o. male patient of Judieth Keens, MD    Presents to adult infusion for C1, D1 Yervoy and Opdivo. Patient is new to facility but not new to these medications.   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  Patient was unaware he needed to go to lab first therefore MP was accessed and labs drawn in infusion.  Dr. Fernanda Drum at chairside during infusion. There are no social work or other referral needs at this time.    MP accessed using sterile technique.  MP flushed with ease, labs drawn and flushed again.      Diagnosis:  Mesothelioma  Treatment on Clinical Trial: Not On Study (NOS)  [facility] - CUSTOM OP Adult - Mesothelioma - Nivolumab 360mg  (once every 3 weeks) + Ipilimumab 1 mg/kg (once every 6 weeks) until progression or unacceptable toxicity (or up to 2 years)    Line Type: Mediport,    Right Port  Blood Return verified prior to administration: Yes    Cryotherapy: No      Vital Signs:  Patient Vitals for the past 12 hrs:   BP Temp Pulse   01/06/22 1645 150/89 -- 69   01/06/22 1325 148/78 98.1 F (36.7 C) 87      Body surface area is 2.23 meters squared.    '  Chemistry        Component Value Date/Time    NA 139 01/06/2022 1401    K 4.1 01/06/2022 1401    CL 103 01/06/2022 1401    CO2 29 01/06/2022 1401    BUN 12.0 01/06/2022 1401    CREAT 0.8 01/06/2022 1401    GLU 110 (H) 01/06/2022 1401        Component Value Date/Time    CA 9.6 01/06/2022 1401    ALKPHOS 103 01/06/2022 1401    AST 10 01/06/2022 1401    ALT 8 01/06/2022 1401    BILITOTAL 0.5 01/06/2022 1401            Lab Results   Component Value Date    WBC 8.17 01/06/2022    HGB 12.2 (L) 01/06/2022    PLT 194 01/06/2022    NEUTROABS 5.76 01/06/2022           Chemo Given within the past 12 hours:   "Chemo/Biotherapy Given" (last 12 hours)       Date/Time Action User  Medication Dose Rate Dose Volume (mL) Rate    01/06/22 1645 Stopped    DB ipilimumab (YERVOY) 100 mg in sodium chloride 0.9 % 100 mL infusion  0 mL/hr       01/06/22 1612 New Bag    DB ipilimumab (YERVOY) 100 mg in sodium chloride 0.9 % 100 mL infusion 100 mg 200 mL/hr       01/06/22 1605 Stopped    DB nivolumab (OPDIVO) 360 mg in sodium chloride 0.9 % 99 mL infusion  0 mL/hr       01/06/22 1540 New Bag    DB nivolumab (OPDIVO) 360 mg in sodium chloride 0.9 % 99 mL infusion 360 mg 198 mL/hr             Hypersensitivity Reaction Noted: No  IV Post Hydration: No, not required  Treatment Outcome:Patient tolerated treatment well.  Blood return verified after administration: Yes  Central Line flushed and de-accessed.    Education: Patient/Family educated regarding the expected outcomes/side effects possible interactions of treatments and medications. Patient verbalizes understanding.    Follow-up Plan: Discharged in stable condition. Instructed to contact physician if any complications or unmanageable symptoms occur.  Request sent for additional appointments.  RTC 6/14.    Trilby Drummer, RN    01/06/2022  5:29 PM

## 2022-01-07 LAB — HEPATITIS B CORE ANTIBODY, TOTAL: Hepatitis B Core Total AB: NONREACTIVE

## 2022-01-07 LAB — T4, FREE: T4 Free: 0.97 ng/dL (ref 0.69–1.48)

## 2022-01-07 LAB — HEPATITIS C ANTIBODY: Hepatitis C, AB: NONREACTIVE

## 2022-01-07 LAB — TSH: TSH: 2.03 u[IU]/mL (ref 0.35–4.94)

## 2022-01-07 LAB — HIV-1/2 AG/AB 4TH GEN. W/ REFLEX: HIV Ag/Ab, 4th Generation: NONREACTIVE

## 2022-01-07 LAB — HEPATITIS B SURFACE ANTIGEN W/ REFLEX TO CONFIRMATION: Hepatitis B Surface Antigen: NONREACTIVE

## 2022-01-07 LAB — HEPATITIS B SURFACE ANTIBODY: HEPATITIS B SURFACE ANTIBODY: <3.31 m[IU]/mL

## 2022-01-07 LAB — CORTISOL: Cortisol: 7.2 ug/dL

## 2022-01-11 ENCOUNTER — Other Ambulatory Visit: Payer: Self-pay | Admitting: Medical

## 2022-01-12 ENCOUNTER — Encounter: Payer: Self-pay | Admitting: Internal Medicine

## 2022-01-13 ENCOUNTER — Other Ambulatory Visit: Payer: Self-pay

## 2022-01-13 DIAGNOSIS — R6889 Other general symptoms and signs: Secondary | ICD-10-CM

## 2022-01-13 DIAGNOSIS — C459 Mesothelioma, unspecified: Secondary | ICD-10-CM

## 2022-01-13 DIAGNOSIS — Z1329 Encounter for screening for other suspected endocrine disorder: Secondary | ICD-10-CM

## 2022-01-16 ENCOUNTER — Other Ambulatory Visit: Payer: Self-pay | Admitting: Internal Medicine

## 2022-01-18 NOTE — Progress Notes (Signed)
Called and spoke with patient. States he has an appointment with [facility] on the same day as his schedule Nivolumab infusion. RN offered to move Nivolumab appointment in order for patient to not miss his appointment with [facility]. States he is unsure what he will be doing at [facility]. RN reviewed last progress note from Dr. Fernanda Drum. Explained that Dr. Fernanda Drum is sending him to [facility] to be screen for a clinical trial for mesothelioma. Pt verbalized understanding.    Rescheduled next treatment to Friday, May 27th. Pt will see Dr. Fernanda Drum prior to treatment. Treatments going forward adjusted to Mondays per patient request. -Oceane Fosse, RN

## 2022-01-19 ENCOUNTER — Encounter: Payer: Self-pay | Admitting: Medical Oncology

## 2022-01-20 ENCOUNTER — Ambulatory Visit: Payer: Medicare HMO | Admitting: Internal Medicine

## 2022-01-20 ENCOUNTER — Ambulatory Visit: Payer: Medicare HMO

## 2022-01-20 ENCOUNTER — Other Ambulatory Visit: Payer: Medicare HMO

## 2022-01-23 ENCOUNTER — Other Ambulatory Visit: Payer: Self-pay | Admitting: Medical Oncology

## 2022-01-23 DIAGNOSIS — Z1329 Encounter for screening for other suspected endocrine disorder: Secondary | ICD-10-CM

## 2022-01-23 DIAGNOSIS — C459 Mesothelioma, unspecified: Secondary | ICD-10-CM

## 2022-01-23 DIAGNOSIS — Z79899 Other long term (current) drug therapy: Secondary | ICD-10-CM

## 2022-01-26 ENCOUNTER — Encounter: Payer: Self-pay | Admitting: Internal Medicine

## 2022-01-27 ENCOUNTER — Ambulatory Visit: Payer: Medicare Other

## 2022-01-27 ENCOUNTER — Other Ambulatory Visit: Payer: Medicare Other

## 2022-01-28 ENCOUNTER — Encounter (INDEPENDENT_AMBULATORY_CARE_PROVIDER_SITE_OTHER): Payer: Self-pay

## 2022-01-29 ENCOUNTER — Ambulatory Visit (INDEPENDENT_AMBULATORY_CARE_PROVIDER_SITE_OTHER): Payer: Medicare Other | Admitting: Medical Oncology

## 2022-01-29 ENCOUNTER — Ambulatory Visit: Payer: Medicare Other

## 2022-01-29 ENCOUNTER — Ambulatory Visit: Payer: Medicare Other | Attending: Medical Oncology

## 2022-01-29 ENCOUNTER — Other Ambulatory Visit: Payer: Medicare Other

## 2022-01-29 VITALS — BP 144/78 | HR 69 | Temp 97.8°F | Wt 224.2 lb

## 2022-01-29 DIAGNOSIS — Z9189 Other specified personal risk factors, not elsewhere classified: Secondary | ICD-10-CM

## 2022-01-29 DIAGNOSIS — Z5111 Encounter for antineoplastic chemotherapy: Secondary | ICD-10-CM | POA: Insufficient documentation

## 2022-01-29 DIAGNOSIS — R6889 Other general symptoms and signs: Secondary | ICD-10-CM | POA: Insufficient documentation

## 2022-01-29 DIAGNOSIS — C459 Mesothelioma, unspecified: Secondary | ICD-10-CM

## 2022-01-29 DIAGNOSIS — C450 Mesothelioma of pleura: Secondary | ICD-10-CM | POA: Insufficient documentation

## 2022-01-29 DIAGNOSIS — Z1329 Encounter for screening for other suspected endocrine disorder: Secondary | ICD-10-CM | POA: Insufficient documentation

## 2022-01-29 DIAGNOSIS — Z79899 Other long term (current) drug therapy: Secondary | ICD-10-CM | POA: Insufficient documentation

## 2022-01-29 LAB — T4, FREE: T4 Free: 0.86 ng/dL (ref 0.69–1.48)

## 2022-01-29 LAB — CBC AND DIFFERENTIAL
Absolute NRBC: 0 10*3/uL (ref 0.00–0.00)
Basophils Absolute Automated: 0.05 10*3/uL (ref 0.00–0.08)
Basophils Automated: 0.6 %
Eosinophils Absolute Automated: 0.51 10*3/uL — ABNORMAL HIGH (ref 0.00–0.44)
Eosinophils Automated: 6.3 %
Hematocrit: 34.9 % — ABNORMAL LOW (ref 37.6–49.6)
Hgb: 11.5 g/dL — ABNORMAL LOW (ref 12.5–17.1)
Immature Granulocytes Absolute: 0.03 10*3/uL (ref 0.00–0.07)
Immature Granulocytes: 0.4 %
Instrument Absolute Neutrophil Count: 5.42 10*3/uL (ref 1.10–6.33)
Lymphocytes Absolute Automated: 1.2 10*3/uL (ref 0.42–3.22)
Lymphocytes Automated: 14.9 %
MCH: 31.2 pg (ref 25.1–33.5)
MCHC: 33 g/dL (ref 31.5–35.8)
MCV: 94.6 fL (ref 78.0–96.0)
MPV: 9.9 fL (ref 8.9–12.5)
Monocytes Absolute Automated: 0.86 10*3/uL — ABNORMAL HIGH (ref 0.21–0.85)
Monocytes: 10.7 %
Neutrophils Absolute: 5.42 10*3/uL (ref 1.10–6.33)
Neutrophils: 67.1 %
Nucleated RBC: 0 /100 WBC (ref 0.0–0.0)
Platelets: 210 10*3/uL (ref 142–346)
RBC: 3.69 10*6/uL — ABNORMAL LOW (ref 4.20–5.90)
RDW: 14 % (ref 11–15)
WBC: 8.07 10*3/uL (ref 3.10–9.50)

## 2022-01-29 LAB — COMPREHENSIVE METABOLIC PANEL
ALT: 8 U/L (ref 0–55)
AST (SGOT): 11 U/L (ref 5–41)
Albumin/Globulin Ratio: 0.9 (ref 0.9–2.2)
Albumin: 3.2 g/dL — ABNORMAL LOW (ref 3.5–5.0)
Alkaline Phosphatase: 96 U/L (ref 37–117)
Anion Gap: 6 (ref 5.0–15.0)
BUN: 11 mg/dL (ref 9.0–28.0)
Bilirubin, Total: 0.6 mg/dL (ref 0.2–1.2)
CO2: 29 mEq/L (ref 17–29)
Calcium: 9.4 mg/dL (ref 7.9–10.2)
Chloride: 103 mEq/L (ref 99–111)
Creatinine: 0.8 mg/dL (ref 0.5–1.5)
Globulin: 3.7 g/dL — ABNORMAL HIGH (ref 2.0–3.6)
Glucose: 122 mg/dL — ABNORMAL HIGH (ref 70–100)
Potassium: 3.9 mEq/L (ref 3.5–5.3)
Protein, Total: 6.9 g/dL (ref 6.0–8.3)
Sodium: 138 mEq/L (ref 135–145)
eGFR: >60 mL/min/{1.73_m2} (ref >=60)

## 2022-01-29 LAB — CORTISOL: Cortisol: 11 ug/dL

## 2022-01-29 LAB — HEMOLYSIS INDEX: Hemolysis Index: 1 Index (ref 0–24)

## 2022-01-29 LAB — TSH: TSH: 1.2 u[IU]/mL (ref 0.35–4.94)

## 2022-01-29 MED ORDER — SODIUM CHLORIDE 0.9 % IV BOLUS
500.0000 mL | Freq: Once | INTRAVENOUS | Status: DC | PRN
Start: 2022-01-29 — End: 2022-01-29

## 2022-01-29 MED ORDER — METHYLPREDNISOLONE SODIUM SUCC 40 MG IJ SOLR (WRAP)
40.0000 mg | Freq: Once | INTRAMUSCULAR | Status: DC | PRN
Start: 2022-01-29 — End: 2022-01-29

## 2022-01-29 MED ORDER — SODIUM CHLORIDE 0.9 % IV SOLN
360.0000 mg | Freq: Once | INTRAVENOUS | Status: AC
Start: 2022-01-29 — End: 2022-01-29
  Administered 2022-01-29: 360 mg via INTRAVENOUS
  Filled 2022-01-29: qty 24

## 2022-01-29 MED ORDER — DIPHENHYDRAMINE HCL 50 MG/ML IJ SOLN
25.0000 mg | Freq: Once | INTRAMUSCULAR | Status: DC | PRN
Start: 2022-01-29 — End: 2022-01-29

## 2022-01-29 MED ORDER — ALBUTEROL SULFATE (2.5 MG/3ML) 0.083% IN NEBU
2.5000 mg | INHALATION_SOLUTION | Freq: Once | RESPIRATORY_TRACT | Status: DC | PRN
Start: 2022-01-29 — End: 2022-01-29

## 2022-01-29 MED ORDER — SODIUM CHLORIDE 0.9 % IV SOLN
25.0000 mL/h | INTRAVENOUS | Status: DC | PRN
Start: 2022-01-29 — End: 2022-01-29

## 2022-01-29 MED ORDER — SODIUM CHLORIDE 0.9 % IV SOLN
250.0000 mL | INTRAVENOUS | Status: DC
Start: 2022-01-29 — End: 2022-01-29
  Administered 2022-01-29: 250 mL via INTRAVENOUS

## 2022-01-29 MED ORDER — EPINEPHRINE HCL 1 MG/ML ADULT ANAPHYLAXIS KIT
0.3000 mg | Freq: Once | INTRAMUSCULAR | Status: DC | PRN
Start: 2022-01-29 — End: 2022-01-29

## 2022-01-29 MED ORDER — FAMOTIDINE 10 MG/ML IV SOLN (WRAP)
20.0000 mg | Freq: Once | INTRAVENOUS | Status: DC | PRN
Start: 2022-01-29 — End: 2022-01-29

## 2022-01-29 MED ORDER — DIPHENHYDRAMINE HCL 50 MG/ML IJ SOLN
50.0000 mg | Freq: Once | INTRAMUSCULAR | Status: DC | PRN
Start: 2022-01-29 — End: 2022-01-29

## 2022-01-29 MED ORDER — SODIUM CHLORIDE (PF) 0.9 % IJ SOLN
5.0000 mL | INTRAMUSCULAR | Status: DC | PRN
Start: 2022-01-29 — End: 2022-01-29
  Administered 2022-01-29: 20 mL via INTRAVENOUS

## 2022-01-29 NOTE — Progress Notes (Signed)
[name] [name] male 71 y.o. , patient in lab today for scheduled mediport access/lab draw. He does not report any new complaints at this time. He does not report fever. Mediport site cleansed with alcohol swabs and CHG. Right chest mediport accessed 20 gauge 3/4 inch huber needle and flushed with 10cc NS with good blood return. Labs obtained per order. Mediport flushed with 20cc NS, transparent dressing applied, intact, dated and curos applied . Patient advised to proceed to infusion clinic. Patient discharged lab ambulatory without complaints.     Harrietta Guardian, RN, BSN, OCN

## 2022-01-29 NOTE — Progress Notes (Signed)
Medical Oncology    Date: 01/29/2022  Patient Name: [name] [name],[name] [name]  Physician: Judieth KeensAmin Bela Bonaparte, MD,   Chief complaint: Epithelioid mesothelioma    History of Present Illness:   [name] [name] is a 71 y.o. 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  His oncological history is as follows:  01/13/2021: Sought medical attention for cough.  CT of the chest showed large right-sided pleural effusion with compressive atelectasis of the right lung with mediastinal shift of contents to the left.  02/05/2021: PET/CT showed rind of hypermetabolic pleural thickening within the right hemithorax for example in the right middle lobe measuring 10 mm with SUV of 9.  Large right pleural effusion with passive atelectasis.  No evidence of distant metastases.  02/27/2021: Underwent right VATS pleural biopsy and talc pleurodesis.  Pathology showed tumor characterized by invasive sheets of epithelioid cells with malignant features in a fibrotic stroma.  Morphology and immunophenotype consistent with epithelioid mesothelioma.  03/23/2021: Initiated carboplatin pemetrexed and bevacizumab.  Following 6 cycles of carboplatin pemetrexed he was maintained on bevacizumab maintenance.  05/21/2021: CT of the chest showed numerous pleural nodules.  No mediastinal or hilar adenopathy.  Emphysematous changes.  09/06/2021: CT of the chest showed pleural studding progression.  10/08/2021: Initiated the second line treatment with ipilimumab and nivolumab.  12/21/2021: Transferred care to [facility].       Interval history   Patient appears continues to develop dyspnea on exertion.  He still is able to do his activities of daily living but does notice worsening shortness of breath.  [facility] and is not interested in her clinical trials for now.  He denies any abdominal pain diarrhea or skin rashes.      Past Medical History:     Past Medical History:   Diagnosis Date    Hyperlipidemia        Past Surgical  History:     Past Surgical History:   Procedure Laterality Date    CHOLECYSTECTOMY         Family History:     Family History   Problem Relation Age of Onset    Dementia Mother     Angina Father     Heart attack Father        Social History:     Social History     Socioeconomic History    Marital status: Married   Tobacco Use    Smoking status: Former     Packs/day: 1.50     Years: 20.00     Pack years: 30.00     Types: Cigarettes, Cigars     Quit date: 1990     Years since quitting: 33.4    Smokeless tobacco: Never   Vaping Use    Vaping status: Never Used   Substance and Sexual Activity    Alcohol use: Yes    Drug use: Yes     Frequency: 4.0 times per week     Types: Marijuana     Social Determinants of Health     Financial Resource Strain: Low Risk  (01/24/2022)    Overall Financial Resource Strain (CARDIA)     Difficulty of Paying Living Expenses: Not hard at all   Food Insecurity: No Food Insecurity (01/24/2022)    Hunger Vital Sign     Worried About Running Out of Food in the Last Year: Never true     Ran Out of Food in the Last Year: Never true  Transportation Needs: No Transportation Needs (01/24/2022)    PRAPARE - Therapist, art (Medical): No     Lack of Transportation (Non-Medical): No   Physical Activity: Inactive (01/24/2022)    Exercise Vital Sign     Days of Exercise per Week: 0 days     Minutes of Exercise per Session: 0 min   Stress: Stress Concern Present (01/24/2022)    [employer] of Occupational Health - Occupational Stress Questionnaire     Feeling of Stress : To some extent   Social Connections: Moderately Isolated (01/24/2022)    Social Connection and Isolation Panel [NHANES]     Frequency of Communication with Friends and Family: More than three times a week     Frequency of Social Gatherings with Friends and Family: More than three times a week     Attends Religious Services: Never     Database administrator or Organizations: No     Attends Banker  Meetings: Never     Marital Status: Married   Catering manager Violence: Not At Risk (01/24/2022)    Humiliation, Afraid, Rape, and Kick questionnaire     Fear of Current or Ex-Partner: No     Emotionally Abused: No     Physically Abused: No     Sexually Abused: No   Housing Stability: High Risk (01/24/2022)    Housing Stability Vital Sign     Unable to Pay for Housing in the Last Year: No     Number of Places Lived in the Last Year: 3     Unstable Housing in the Last Year: No       Allergies:   No Known Allergies    Medications:     Current Outpatient Medications   Medication Sig Dispense Refill    acetaminophen (TYLENOL) 500 MG tablet Take 1,000 mg by mouth every 6 (six) hours as needed      aspirin 81 MG EC tablet Take 81 mg by mouth daily      atorvastatin (LIPITOR) 80 MG tablet Take 1 tablet by mouth daily      glucosamine-chondroitin 500-400 MG tablet Take 1 tablet by mouth daily      L-Lysine 1000 MG Tab Take 1-2 tablets by mouth      lidocaine-prilocaine (EMLA) cream Apply to the Port-A-Cath site 30-60-minute before chemotherapy.      ondansetron (ZOFRAN) 8 MG tablet Take 1 tablet (8 mg) by mouth every 8 hours as needed for nausea and/or vomiting. 30 tablet 1    Oxymetazoline HCl (Nasal Spray) 0.05 % Solution 1 spray by Nasal route      prochlorperazine (COMPAZINE) 10 MG tablet TAKE 1 TABLET BY MOUTH EVERY 6 HOURS AS NEEDED FOR NAUSEA OR VOMITING.      terazosin (HYTRIN) 10 MG capsule Take 1 capsule by mouth daily      UNABLE TO FIND Mushroom super blend (immunity and brain supplement) daily       No current facility-administered medications for this visit.       Review of Systems:   All other systems reviewed and are negative.    Physical Exam:     Vitals:    01/29/22 1509   BP: 133/75   Pulse: 69   Temp: 97.8 F (36.6 C)   SpO2: 94%       ECOG performance status: 0  Well-appearing 71 year old male no acute distress  Mood and judgment appropriate  Respiratory effort normal  Cranial nerves grossly  intact  Laboratory:     Lab Results   Component Value Date    WBC 8.07 01/29/2022    RBC 3.69 (L) 01/29/2022    HGB 11.5 (L) 01/29/2022    HCT 34.9 (L) 01/29/2022    MCV 94.6 01/29/2022    MCH 31.2 01/29/2022    MCHC 33.0 01/29/2022    RDW 14 01/29/2022    PLT 210 01/29/2022    MPV 9.9 01/29/2022       Lab Results   Component Value Date    NEUTRO 67.1 01/29/2022     No results found for: RETICCTPCT  No results found for: IRON, FERRITIN, TIBC  No results found for: FOLATE  No results found for: HAPTOGLOBIN, LDH    Lab Results   Component Value Date    GLU 110 (H) 01/06/2022    BUN 12.0 01/06/2022    EGFR >60.0 01/06/2022    NA 139 01/06/2022    K 4.1 01/06/2022    CL 103 01/06/2022    CO2 29 01/06/2022    PROT 7.1 01/06/2022    ALB 3.3 (L) 01/06/2022    GLOB 3.8 (H) 01/06/2022    ALKPHOS 103 01/06/2022    AST 10 01/06/2022    ALT 8 01/06/2022         Radiology:   Per HPI    Pathology:   Per HPI    Assessment & Plan:   [name] [name] is a 71 y.o. 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    #Encounter for management of epitheloid mesothelioma:  Mr. Oelkers was evaluated at [facility] for clinical trials.  At the time being he is not interested given what he perceives as invasiveness of the cellular therapies.  He remains on ipilimumab and nivolumab and is experiencing slowly progressing dyspnea on exertion which I assume is due to underlying disease.    #Management of antineoplastic side effects:  Reviewed labs.  Tolerating ipilimumab and nivolumab without dose-limiting toxicities.    # Plan:  Continue ipilimumab and nivolumab    #Followup:   MD visit 02/22/2022      Judieth Keens, MD  Thoracic/Sarcoma Medical Oncology      Billing:  Level 5 f/u  Number of problems addressed: 1 or more chronic illness with severe exacerbation, progression or side effects of treatment  Risk of complications: Drug therapy requiring intensive monitoring for toxicity

## 2022-01-29 NOTE — Progress Notes (Signed)
[name] [name] [facility] - Adult Infusion   Visit Date: 01/29/2022        [name] [name] is a 71 y.o. male patient of [name] Keens, MD  Patient presents to the infusion center for Cycle 1, Day 22: Nivolumab 360 mg     Patient arrived ambulatory, unaccompanied. Reported increased energy after the last infusion for 1.5 days. Denied any itching/rashes or joint pain. Stable weight.     Received required pre-authorization pop-up when releasing medication in epic. Contacted Berneice Heinrich via epic secure chat with no response. Infusion Manager Pandora Leiter Ronquillo contacted financial clearance manager via phone who confirmed patient is financially cleared to proceed.     Diagnosis: Mesothelioma   Treatment on Clinical Trial: Not On Study (NOS)  [facility] - CUSTOM OP Adult - Mesothelioma - Nivolumab 360mg  (once every 3 weeks) + Ipilimumab 1 mg/kg (once every 6 weeks) until progression or unacceptable toxicity (or up to 2 years)      IV Pre Hydration: N/A  Urine Parameters: N/A  Pre-Medications: N/A    Line Type: Mediport,    Right Port  Blood Return verified prior to administration: Yes    Vital Signs:  Patient Vitals for the past 12 hrs:   BP Temp Pulse   01/29/22 1711 144/78 -- --   01/29/22 1505 133/75 97.8 F (36.6 C) 69      Body surface area is 2.22 meters squared.    '  Chemistry        Component Value Date/Time    NA 138 01/29/2022 1445    K 3.9 01/29/2022 1445    CL 103 01/29/2022 1445    CO2 29 01/29/2022 1445    BUN 11.0 01/29/2022 1445    CREAT 0.8 01/29/2022 1445    GLU 122 (H) 01/29/2022 1445        Component Value Date/Time    CA 9.4 01/29/2022 1445    ALKPHOS 96 01/29/2022 1445    AST 11 01/29/2022 1445    ALT 8 01/29/2022 1445    BILITOTAL 0.6 01/29/2022 1445            Lab Results   Component Value Date    WBC 8.07 01/29/2022    HGB 11.5 (L) 01/29/2022    PLT 210 01/29/2022    NEUTROABS 5.42 01/29/2022       Administrations This Visit       0.9% NaCl infusion       Admin Date  01/29/2022  16:29  Action  New Bag Dose  250 mL Rate  20 mL/hr Route  Intravenous Ordering Provider  [name] Keens, MD              nivolumab (OPDIVO) 360 mg in sodium chloride 0.9 % 99 mL infusion       Admin Date  01/29/2022  16:29 Action  New Bag Dose  360 mg Rate  198 mL/hr Route  Intravenous Ordering Provider  [name] Keens, MD                    Chemo Given within the past 12 hours:   "Chemo/Biotherapy Given" (last 12 hours)       Date/Time Action User Medication Dose Rate Dose Volume (mL) Rate    01/29/22 1659 Stopped    ES nivolumab (OPDIVO) 360 mg in sodium chloride 0.9 % 99 mL infusion  0 mL/hr       01/29/22 1629 New Bag    ES nivolumab (OPDIVO) 360  mg in sodium chloride 0.9 % 99 mL infusion 360 mg 198 mL/hr                 Hypersensitivity Reaction Noted: No  IV Post Hydration: No, not required  Treatment Outcome:Patient tolerated treatment well.  Blood return verified after administration: Yes   Central Line flushed and deacessed.    Education: Patient/Family educated regarding the expected outcomes/side effects possible interactions of treatments. Patient verbalizes understanding.    Follow-up Plan: Discharged in stable condition, unaccompanied.  Instructed to contact physician if any complications or unmanageable symptoms occur.      Return to care: 02/22/22 - Cycle 2, Day 1: Nivolumab/Ipilimumab    Clinton Sawyer, RN, OCN    01/29/2022  5:27 PM

## 2022-02-01 ENCOUNTER — Other Ambulatory Visit: Payer: Medicare Other

## 2022-02-01 ENCOUNTER — Ambulatory Visit: Payer: Medicare Other

## 2022-02-02 ENCOUNTER — Encounter: Payer: Self-pay | Admitting: Medical Oncology

## 2022-02-03 ENCOUNTER — Other Ambulatory Visit: Payer: Self-pay

## 2022-02-10 ENCOUNTER — Telehealth: Payer: Self-pay

## 2022-02-10 ENCOUNTER — Encounter: Payer: Self-pay | Admitting: Medical Oncology

## 2022-02-10 DIAGNOSIS — C459 Mesothelioma, unspecified: Secondary | ICD-10-CM

## 2022-02-10 NOTE — Telephone Encounter (Signed)
Patient is scheduled with [name] [name] on 02/25/22 at 1pm. Confirmed date and time of the appointment. Patient is aware to arrive 30 minutes prior to the appointment.     Hx mesothelioma; cancer pain, inquiring about marijuana card,  REF: Dr. Fernanda Drum    Provided facility address and went over visitor's policy.

## 2022-02-16 ENCOUNTER — Other Ambulatory Visit: Payer: Self-pay | Admitting: Medical Oncology

## 2022-02-16 DIAGNOSIS — Z79899 Other long term (current) drug therapy: Secondary | ICD-10-CM

## 2022-02-16 DIAGNOSIS — C459 Mesothelioma, unspecified: Secondary | ICD-10-CM

## 2022-02-16 DIAGNOSIS — Z1329 Encounter for screening for other suspected endocrine disorder: Secondary | ICD-10-CM

## 2022-02-17 ENCOUNTER — Ambulatory Visit: Payer: Medicare Other

## 2022-02-21 ENCOUNTER — Other Ambulatory Visit: Payer: Self-pay | Admitting: Medical Oncology

## 2022-02-21 DIAGNOSIS — C459 Mesothelioma, unspecified: Secondary | ICD-10-CM

## 2022-02-22 ENCOUNTER — Ambulatory Visit (INDEPENDENT_AMBULATORY_CARE_PROVIDER_SITE_OTHER): Payer: Medicare Other | Admitting: Medical Oncology

## 2022-02-22 ENCOUNTER — Ambulatory Visit: Payer: Medicare Other | Attending: Medical Oncology

## 2022-02-22 ENCOUNTER — Ambulatory Visit: Payer: Medicare Other

## 2022-02-22 ENCOUNTER — Telehealth: Payer: Self-pay

## 2022-02-22 VITALS — BP 125/73 | HR 65 | Temp 98.4°F | Wt 221.0 lb

## 2022-02-22 DIAGNOSIS — Z1329 Encounter for screening for other suspected endocrine disorder: Secondary | ICD-10-CM | POA: Insufficient documentation

## 2022-02-22 DIAGNOSIS — R6889 Other general symptoms and signs: Secondary | ICD-10-CM

## 2022-02-22 DIAGNOSIS — C459 Mesothelioma, unspecified: Secondary | ICD-10-CM | POA: Insufficient documentation

## 2022-02-22 DIAGNOSIS — Z79899 Other long term (current) drug therapy: Secondary | ICD-10-CM

## 2022-02-22 DIAGNOSIS — Z9189 Other specified personal risk factors, not elsewhere classified: Secondary | ICD-10-CM

## 2022-02-22 DIAGNOSIS — Z5111 Encounter for antineoplastic chemotherapy: Secondary | ICD-10-CM | POA: Insufficient documentation

## 2022-02-22 DIAGNOSIS — C450 Mesothelioma of pleura: Secondary | ICD-10-CM

## 2022-02-22 LAB — COMPREHENSIVE METABOLIC PANEL
ALT: 10 U/L (ref 0–55)
AST (SGOT): 11 U/L (ref 5–41)
Albumin/Globulin Ratio: 0.9 (ref 0.9–2.2)
Albumin: 3.2 g/dL — ABNORMAL LOW (ref 3.5–5.0)
Alkaline Phosphatase: 97 U/L (ref 37–117)
Anion Gap: 5 (ref 5.0–15.0)
BUN: 13 mg/dL (ref 9.0–28.0)
Bilirubin, Total: 0.3 mg/dL (ref 0.2–1.2)
CO2: 30 mEq/L — ABNORMAL HIGH (ref 17–29)
Calcium: 9.4 mg/dL (ref 7.9–10.2)
Chloride: 105 mEq/L (ref 99–111)
Creatinine: 0.8 mg/dL (ref 0.5–1.5)
Globulin: 3.7 g/dL — ABNORMAL HIGH (ref 2.0–3.6)
Glucose: 126 mg/dL — ABNORMAL HIGH (ref 70–100)
Potassium: 4 mEq/L (ref 3.5–5.3)
Protein, Total: 6.9 g/dL (ref 6.0–8.3)
Sodium: 140 mEq/L (ref 135–145)
eGFR: >60 mL/min/{1.73_m2} (ref >=60)

## 2022-02-22 LAB — CBC AND DIFFERENTIAL
Absolute NRBC: 0 10*3/uL (ref 0.00–0.00)
Basophils Absolute Automated: 0.06 10*3/uL (ref 0.00–0.08)
Basophils Automated: 0.8 %
Eosinophils Absolute Automated: 0.35 10*3/uL (ref 0.00–0.44)
Eosinophils Automated: 4.6 %
Hematocrit: 35 % — ABNORMAL LOW (ref 37.6–49.6)
Hgb: 11.4 g/dL — ABNORMAL LOW (ref 12.5–17.1)
Immature Granulocytes Absolute: 0.03 10*3/uL (ref 0.00–0.07)
Immature Granulocytes: 0.4 %
Instrument Absolute Neutrophil Count: 5.37 10*3/uL (ref 1.10–6.33)
Lymphocytes Absolute Automated: 1.12 10*3/uL (ref 0.42–3.22)
Lymphocytes Automated: 14.6 %
MCH: 30.8 pg (ref 25.1–33.5)
MCHC: 32.6 g/dL (ref 31.5–35.8)
MCV: 94.6 fL (ref 78.0–96.0)
MPV: 9.6 fL (ref 8.9–12.5)
Monocytes Absolute Automated: 0.72 10*3/uL (ref 0.21–0.85)
Monocytes: 9.4 %
Neutrophils Absolute: 5.37 10*3/uL (ref 1.10–6.33)
Neutrophils: 70.2 %
Nucleated RBC: 0 /100 WBC (ref 0.0–0.0)
Platelets: 216 10*3/uL (ref 142–346)
RBC: 3.7 10*6/uL — ABNORMAL LOW (ref 4.20–5.90)
RDW: 15 % (ref 11–15)
WBC: 7.65 10*3/uL (ref 3.10–9.50)

## 2022-02-22 LAB — CORTISOL: Cortisol: 7.2 ug/dL

## 2022-02-22 LAB — TSH: TSH: 1.27 u[IU]/mL (ref 0.35–4.94)

## 2022-02-22 LAB — HEMOLYSIS INDEX: Hemolysis Index: 2 Index (ref 0–24)

## 2022-02-22 LAB — T4, FREE: T4 Free: 0.82 ng/dL (ref 0.69–1.48)

## 2022-02-22 MED ORDER — SODIUM CHLORIDE 0.9 % IV SOLN
100.0000 mg | Freq: Once | INTRAVENOUS | Status: AC
Start: 2022-02-22 — End: 2022-02-22
  Administered 2022-02-22: 100 mg via INTRAVENOUS
  Filled 2022-02-22: qty 20

## 2022-02-22 MED ORDER — OXYCODONE HCL 5 MG PO TABS
5.0000 mg | ORAL_TABLET | ORAL | 0 refills | Status: AC | PRN
Start: 2022-02-22 — End: 2022-03-24

## 2022-02-22 MED ORDER — SODIUM CHLORIDE 0.9 % IV SOLN
250.0000 mL | INTRAVENOUS | Status: DC
Start: 2022-02-22 — End: 2022-02-22
  Administered 2022-02-22: 250 mL via INTRAVENOUS

## 2022-02-22 MED ORDER — SODIUM CHLORIDE (PF) 0.9 % IJ SOLN
5.0000 mL | INTRAMUSCULAR | Status: DC | PRN
Start: 2022-02-22 — End: 2022-02-22
  Administered 2022-02-22: 10 mL via INTRAVENOUS

## 2022-02-22 MED ORDER — SODIUM CHLORIDE 0.9 % IV SOLN
360.0000 mg | Freq: Once | INTRAVENOUS | Status: AC
Start: 2022-02-22 — End: 2022-02-22
  Administered 2022-02-22: 360 mg via INTRAVENOUS
  Filled 2022-02-22: qty 14

## 2022-02-22 NOTE — Progress Notes (Signed)
[name] [name] is a 71 y.o. patient of Judieth Keens, MD        Patient presents for port access for infusion.   Mediport accessed using 20 g 3/4 inch huber needle x1 attempt. Positive blood return noted. Labs drawn per order. Mediport flushed with 10 ml normal saline and left accessed. Dressing c/d/i, initialed, and dated. Curos cap applied on end. Patient was discharged from lab in no acute distress.      Zonia Kief, RN  02/22/2022 3:10 PM

## 2022-02-22 NOTE — Progress Notes (Signed)
[facility] - Adult Infusion   Visit Date: 02/22/2022        [name] [name] is a 71 y.o. male patient of Judieth Keens, MD    Since his last visit, he has been doing well.   Patient presents to the infusion center for Cycle2, Day1.   Overall, he states his energy level is good.  The patient is able to perform most usual functions.. His appetite is reported to be intact, with no significant changes in weight.. He reports no complaints. He has no other concerns. There are no social work or other referral needs at this time.    Diagnosis: Mesothelioma  Treatment on Clinical Trial: Not On Study (NOS)  [facility] - CUSTOM OP Adult - Mesothelioma - Nivolumab 360mg  (once every 3 weeks) + Ipilimumab 1 mg/kg (once every 6 weeks) until progression or unacceptable toxicity (or up to 2 years)      IV Pre Hydration: N/A  Urine Parameters:met  Pre-Medications: none ordered    Line Type: Mediport,    Right Port  Blood Return verified prior to administration: Yes      Cryotherapy: No        Vital Signs:  Patient Vitals for the past 12 hrs:   BP Temp Pulse   02/22/22 1620 125/73 -- 65   02/22/22 1355 122/68 98.4 F (36.9 C) 64      Body surface area is 2.2 meters squared.    '  Chemistry        Component Value Date/Time    NA 140 02/22/2022 1320    K 4.0 02/22/2022 1320    CL 105 02/22/2022 1320    CO2 30 (H) 02/22/2022 1320    BUN 13.0 02/22/2022 1320    CREAT 0.8 02/22/2022 1320    GLU 126 (H) 02/22/2022 1320        Component Value Date/Time    CA 9.4 02/22/2022 1320    ALKPHOS 97 02/22/2022 1320    AST 11 02/22/2022 1320    ALT 10 02/22/2022 1320    BILITOTAL 0.3 02/22/2022 1320            Lab Results   Component Value Date    WBC 7.65 02/22/2022    HGB 11.4 (L) 02/22/2022    PLT 216 02/22/2022    NEUTROABS 5.37 02/22/2022           Chemo Given within the past 12 hours:   "Chemo/Biotherapy Given" (last 12 hours)       Date/Time Action User Medication Dose Rate Dose Volume (mL) Rate    02/22/22 1617 Stopped    AT  ipilimumab (YERVOY) 100 mg in sodium chloride 0.9 % 100 mL infusion  0 mL/hr       02/22/22 1547 New Bag    AT ipilimumab (YERVOY) 100 mg in sodium chloride 0.9 % 100 mL infusion 100 mg 200 mL/hr       02/22/22 1525 Stopped    AT nivolumab (OPDIVO) 360 mg in sodium chloride 0.9 % 99 mL infusion  0 mL/hr       02/22/22 1455 New Bag    AT nivolumab (OPDIVO) 360 mg in sodium chloride 0.9 % 99 mL infusion 360 mg 198 mL/hr                 Hypersensitivity Reaction Noted: No  IV Post Hydration: No, not required  Treatment Outcome:Patient tolerated treatment well.  Blood return verified after administration: Yes  Central Line flushed and deacessed.    Education: Patient/Family educated regarding the expected outcomes/side effects possible interactions of treatments and medications. Patient verbalizes understanding.    Follow-up Plan: Discharged in stable condition. Accompanied by, self. Instructed to contact physician if any complications or unmanageable symptoms occur.  RTC 04/05/2022    Laurey Morale, RN     02/22/2022  7:29 PM

## 2022-02-22 NOTE — Telephone Encounter (Signed)
PET scan ordered. Request made to have scheduled after July 4th holiday. Dr. Fernanda Drum to f/u.

## 2022-02-22 NOTE — Progress Notes (Signed)
Medical Oncology    Date: 02/22/2022  Patient Name: [name] [name],[name] [name]  Physician: Judieth Keens, MD,   Chief complaint: Epithelioid mesothelioma    History of Present Illness:   [name] [name] is a 71 y.o. 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  His oncological history is as follows:  01/13/2021: Sought medical attention for cough.  CT of the chest showed large right-sided pleural effusion with compressive atelectasis of the right lung with mediastinal shift of contents to the left.  02/05/2021: PET/CT showed rind of hypermetabolic pleural thickening within the right hemithorax for example in the right middle lobe measuring 10 mm with SUV of 9.  Large right pleural effusion with passive atelectasis.  No evidence of distant metastases.  02/27/2021: Underwent right VATS pleural biopsy and talc pleurodesis.  Pathology showed tumor characterized by invasive sheets of epithelioid cells with malignant features in a fibrotic stroma.  Morphology and immunophenotype consistent with epithelioid mesothelioma.  03/23/2021: Initiated carboplatin pemetrexed and bevacizumab.  Following 6 cycles of carboplatin pemetrexed he was maintained on bevacizumab maintenance.  05/21/2021: CT of the chest showed numerous pleural nodules.  No mediastinal or hilar adenopathy.  Emphysematous changes.  09/06/2021: CT of the chest showed pleural studding progression.  10/08/2021: Initiated the second line treatment with ipilimumab and nivolumab.  12/21/2021: Transferred care to [facility].   12/22/2021: PET/CT showed multifocal hypermetabolic pleural soft tissue thickening and new hypermetabolic retroperitoneal lymphadenopathy.  Mildly enlarged of the FDG avid mediastinal lymph nodes.      Interval history   [name] [name] reports ongoing right shoulder pain.  Controlled with oxycodone      Past Medical History:     Past Medical History:   Diagnosis Date    Hyperlipidemia        Past Surgical History:     Past  Surgical History:   Procedure Laterality Date    CHOLECYSTECTOMY         Family History:     Family History   Problem Relation Age of Onset    Dementia Mother     Angina Father     Heart attack Father        Social History:     Social History     Socioeconomic History    Marital status: Married   Tobacco Use    Smoking status: Former     Packs/day: 1.50     Years: 20.00     Pack years: 30.00     Types: Cigarettes, Cigars     Quit date: 1990     Years since quitting: 33.4    Smokeless tobacco: Never   Vaping Use    Vaping status: Never Used   Substance and Sexual Activity    Alcohol use: Yes    Drug use: Yes     Frequency: 4.0 times per week     Types: Marijuana     Social Determinants of Health     Financial Resource Strain: Low Risk  (01/24/2022)    Overall Financial Resource Strain (CARDIA)     Difficulty of Paying Living Expenses: Not hard at all   Food Insecurity: No Food Insecurity (01/24/2022)    Hunger Vital Sign     Worried About Running Out of Food in the Last Year: Never true     Ran Out of Food in the Last Year: Never true   Transportation Needs: No Transportation Needs (01/24/2022)    PRAPARE - Transportation     Lack  of Transportation (Medical): No     Lack of Transportation (Non-Medical): No   Physical Activity: Inactive (01/24/2022)    Exercise Vital Sign     Days of Exercise per Week: 0 days     Minutes of Exercise per Session: 0 min   Stress: Stress Concern Present (01/24/2022)    [employer]ational [employer] - Occupational Stress Questionnaire     Feeling of Stress : To some extent   Social Connections: Moderately Isolated (01/24/2022)    Social Connection and Isolation Panel [NHANES]     Frequency of Communication with Friends and Family: More than three times a week     Frequency of Social Gatherings with Friends and Family: More than three times a week     Attends Religious Services: Never     Database administratorActive Member of Clubs or Organizations: No     Attends BankerClub or Organization Meetings: Never      Marital Status: Married   Catering managerntimate Partner Violence: Not At Risk (01/24/2022)    Humiliation, Afraid, Rape, and Kick questionnaire     Fear of Current or Ex-Partner: No     Emotionally Abused: No     Physically Abused: No     Sexually Abused: No   Housing Stability: High Risk (01/24/2022)    Housing Stability Vital Sign     Unable to Pay for Housing in the Last Year: No     Number of Places Lived in the Last Year: 3     Unstable Housing in the Last Year: No       Allergies:   No Known Allergies    Medications:     Current Outpatient Medications   Medication Sig Dispense Refill    acetaminophen (TYLENOL) 500 MG tablet Take 1,000 mg by mouth every 6 (six) hours as needed      aspirin 81 MG EC tablet Take 81 mg by mouth daily      atorvastatin (LIPITOR) 80 MG tablet Take 1 tablet by mouth daily      glucosamine-chondroitin 500-400 MG tablet Take 1 tablet by mouth daily      L-Lysine 1000 MG Tab Take 1-2 tablets by mouth      lidocaine-prilocaine (EMLA) cream Apply to the Port-A-Cath site 30-60-minute before chemotherapy.      ondansetron (ZOFRAN) 8 MG tablet Take 1 tablet (8 mg) by mouth every 8 hours as needed for nausea and/or vomiting. 30 tablet 1    oxyCODONE (ROXICODONE) 5 MG immediate release tablet Take 1 tablet (5 mg) by mouth every 4 (four) hours as needed for Pain 90 tablet 0    Oxymetazoline HCl (Nasal Spray) 0.05 % Solution 1 spray by Nasal route      prochlorperazine (COMPAZINE) 10 MG tablet TAKE 1 TABLET BY MOUTH EVERY 6 HOURS AS NEEDED FOR NAUSEA OR VOMITING.      terazosin (HYTRIN) 10 MG capsule Take 1 capsule by mouth daily      UNABLE TO FIND Mushroom super blend (immunity and brain supplement) daily       No current facility-administered medications for this visit.     Facility-Administered Medications Ordered in Other Visits   Medication Dose Route Frequency Provider Last Rate Last Admin    0.9% NaCl infusion  250 mL Intravenous Continuous Abbey Veith, MD        ipilimumab (YERVOY) 100 mg in  sodium chloride 0.9 % 100 mL infusion  100 mg Intravenous Once Judieth KeensBenyounes, Murrell Dome, MD  nivolumab (OPDIVO) 360 mg in sodium chloride 0.9 % 99 mL infusion  360 mg Intravenous Once Tabria Steines, MD        sodium chloride (PF) 0.9 % injection 5-20 mL  5-20 mL Intravenous PRN Aubery Douthat, MD           Review of Systems:   All other systems reviewed and are negative.    Physical Exam:     Vitals:    02/22/22 1453   BP: 122/68   Pulse: 64   Temp: 98.4 F (36.9 C)   SpO2: 96%       ECOG performance status: 1  Well-appearing 71 year old male no acute distress  Mood and judgment appropriate  Respiratory effort normal  Cranial nerves grossly intact  Laboratory:     Lab Results   Component Value Date    WBC 7.65 02/22/2022    RBC 3.70 (L) 02/22/2022    HGB 11.4 (L) 02/22/2022    HCT 35.0 (L) 02/22/2022    MCV 94.6 02/22/2022    MCH 30.8 02/22/2022    MCHC 32.6 02/22/2022    RDW 15 02/22/2022    PLT 216 02/22/2022    MPV 9.6 02/22/2022       Lab Results   Component Value Date    NEUTRO 70.2 02/22/2022     No results found for: RETICCTPCT  No results found for: IRON, FERRITIN, TIBC  No results found for: FOLATE  No results found for: HAPTOGLOBIN, LDH    Lab Results   Component Value Date    GLU 126 (H) 02/22/2022    BUN 13.0 02/22/2022    EGFR >60.0 02/22/2022    NA 140 02/22/2022    K 4.0 02/22/2022    CL 105 02/22/2022    CO2 30 (H) 02/22/2022    PROT 6.9 02/22/2022    ALB 3.2 (L) 02/22/2022    GLOB 3.7 (H) 02/22/2022    ALKPHOS 97 02/22/2022    AST 11 02/22/2022    ALT 10 02/22/2022         Radiology:   Per HPI    Pathology:   Per HPI    Assessment & Plan:   [name] [name] is a 71 y.o. 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     #Encounter for management of celiac mesothelioma:  [name] [name] remains clinically stable although he is mildly symptomatic.  He requested cannabis prescription; we will arrange.  Continue with ipilimumab and nivolumab we will see him  back with restaging imaging.  He was seen at [facility] and is not enthusiastic about cellular therapies.    #Management of antineoplastic side effects:  Reviewed labs.  Tolerating ipilimumab and nivolumab without evidence of immune related adverse events.    # Plan:  Continue ipilimumab and nivolumab    #Followup:   MD visit 03/12/2022      Judieth Keens, MD  Thoracic/Sarcoma Medical Oncology      Billing:  Level 5 f/u  Number of problems addressed: 1 or more chronic illness with severe exacerbation, progression or side effects of treatment  Risk of complications: Drug therapy requiring intensive monitoring for toxicity

## 2022-02-25 ENCOUNTER — Ambulatory Visit: Payer: Medicare Other

## 2022-02-25 VITALS — BP 129/77 | HR 87 | Temp 98.2°F | Resp 16 | Ht 68.5 in | Wt 224.8 lb

## 2022-02-25 DIAGNOSIS — G893 Neoplasm related pain (acute) (chronic): Secondary | ICD-10-CM

## 2022-02-25 DIAGNOSIS — C459 Mesothelioma, unspecified: Secondary | ICD-10-CM | POA: Insufficient documentation

## 2022-02-25 DIAGNOSIS — Z0279 Encounter for issue of other medical certificate: Secondary | ICD-10-CM

## 2022-02-25 DIAGNOSIS — Z515 Encounter for palliative care: Secondary | ICD-10-CM

## 2022-02-25 DIAGNOSIS — F419 Anxiety disorder, unspecified: Secondary | ICD-10-CM

## 2022-02-25 DIAGNOSIS — M792 Neuralgia and neuritis, unspecified: Secondary | ICD-10-CM

## 2022-02-25 DIAGNOSIS — G4701 Insomnia due to medical condition: Secondary | ICD-10-CM

## 2022-02-25 MED ORDER — PREGABALIN 25 MG PO CAPS
ORAL_CAPSULE | ORAL | 0 refills | Status: DC
Start: 2022-02-25 — End: 2022-03-30

## 2022-02-25 NOTE — Progress Notes (Signed)
[facility] Palliative Medicine & Comprehensive Care Consultation     Palliative Care Consult     Date Time: 02/25/2022 3:41 PM  Patient Name: [name] [name]  Primary Care Physician: Patsy Lager, MD  Consulting Physician: Janett Billow, FNP  Consulting Service: Palliative Medicine and Comprehensive Care  Consulted request from Judieth Keens, MD to see patient regarding: Pain and Medical Cannabis     History of Presenting Illness   [name] [name] is a 71 y.o. male with epithelioid mesothelioma receiving ipilimumab and nivolumab. Referred to palliative care for pain and medical cannabis certification.    Patient presents today accompanied by his wife who contributed to the visit.    Reports pain in his R shoulder which which radiates from the top of his shoulder blade down over his shoulder. Pain described as shooting, numbness, and tingling. Has a difficult time sleeping d/t the pain. Thought he had a pinched nerve but Dr. Fernanda Drum told him in was r/t the cancer. Taking Tylenol 1,000 mg typically twice a day and oxycodone 5 mg at night, sometimes twice. Would prefer not to be on the oxycodone and is interested in medical marijuana.     Appetite has been "not good." Has been gradually losing weight. Perhaps 5-10 lb in the 6 months. Has had changes in taste and smell.  Having trouble with constipation. Using OTC senna and occasionally MoM.  DOE. Can go up one flight of stairs but has difficulty with two.  History of OCD/Anxiety.    No other acute complaints    PMP Reviewed  Last Rx: 6/19 oxycodone 5 mg #90  OME: 6.25-12.5 mg per day    - OMEs calculated using updated opioid conversion table (McPherson ML. Demystifying Opioid Conversion Calculations, A Guide for Effective Dosing, 2nd Edition. [employer]SHP; 2018.)          Past Medical, Surgical and Family History   Past Medical History:   Diagnosis Date    Arthritis     Hyperlipidemia     Lung anomaly     Malignant neoplasm       Past Surgical  History:   Procedure Laterality Date    CHOLECYSTECTOMY        Family History   Problem Relation Age of Onset    Dementia Mother     Angina Father     Heart attack Father        Social History     Marital Status: married    Lives with: Wife    Cultural Concerns:    Translator needed: [x]  NO   []  YES                                       Language: English  ID#_________    Social History     Tobacco Use   Smoking Status Former    Packs/day: 1.50    Years: 20.00    Total pack years: 30.00    Types: Cigarettes, Cigars    Quit date: 09/06/1988    Years since quitting: 33.4   Smokeless Tobacco Never     Social History     Substance and Sexual Activity   Alcohol Use Yes    Alcohol/week: 3.0 standard drinks of alcohol    Types: 3 Shots of liquor per week     Social History     Substance and Sexual Activity   Drug Use  Yes    Frequency: 4.0 times per week    Types: Marijuana             Palliative Functional Assessment    ADLs     Independent Needs assistance Dependent   Ambulation [x]  []  []    Transferring [x]  []  []    Dressing [x]  []  []    Bathing [x]  []  []    Toileting [x]  []  []    Feeding [x]  []  []      Palliative Performance Scale: 90% - Full ambulation, normal activity, some evidence of disease, full self-care, normal intake, full LOC    ECOG (Cancer Patients): 1 - Restricted in physically strenuous activity but ambulatory and able to carry out work of a light or sedentary nature, e.g. light housework, office work       Review of Systems     Review of Systems   Constitutional:  Positive for malaise/fatigue and weight loss.   HENT: Negative.     Eyes: Negative.    Respiratory:  Positive for shortness of breath and wheezing.    Cardiovascular:  Positive for chest pain.   Gastrointestinal:  Positive for constipation.   Musculoskeletal:  Positive for back pain.   Skin:  Positive for itching.   Neurological:  Positive for dizziness (occasional) and tingling.   Psychiatric/Behavioral:  Positive for memory loss. The patient is  nervous/anxious.         Medications   Outpatient Encounter Medications as of 02/25/2022   Medication Sig Dispense Refill    acetaminophen (TYLENOL) 500 MG tablet Take 2 tablets (1,000 mg) by mouth every 6 (six) hours as needed      aspirin 81 MG EC tablet Take 1 tablet (81 mg) by mouth daily      atorvastatin (LIPITOR) 80 MG tablet Take 1 tablet (80 mg) by mouth daily      glucosamine-chondroitin 500-400 MG tablet Take 1 tablet by mouth daily      L-Lysine 1000 MG Tab Take 1-2 tablets (1,000-2,000 mg) by mouth      lidocaine-prilocaine (EMLA) cream Apply to the Port-A-Cath site 30-60-minute before chemotherapy.      Oxymetazoline HCl (Nasal Spray) 0.05 % Solution 1 spray by Nasal route      terazosin (HYTRIN) 10 MG capsule Take 1 capsule (10 mg) by mouth daily      UNABLE TO FIND Mushroom super blend (immunity and brain supplement) daily      albuterol sulfate HFA (PROVENTIL) 108 (90 Base) MCG/ACT inhaler TAKE 2 PUFFS BY MOUTH EVERY 6 HOURS AS NEEDED FOR WHEEZE OR SHORTNESS OF BREATH (Patient not taking: Reported on 02/25/2022)      ondansetron (ZOFRAN) 8 MG tablet Take 1 tablet (8 mg) by mouth every 8 hours as needed for nausea and/or vomiting. (Patient not taking: Reported on 02/25/2022) 30 tablet 1    oxyCODONE (ROXICODONE) 5 MG immediate release tablet Take 1 tablet (5 mg) by mouth every 4 (four) hours as needed for Pain (Patient not taking: Reported on 02/25/2022) 90 tablet 0    pregabalin (LYRICA) 25 MG capsule Take 1 capsule (25 mg) by mouth 2 (two) times daily for 7 days, THEN 2 capsules (50 mg) 2 (two) times daily for 7 days, THEN 3 capsules (75 mg) 2 (two) times daily for 16 days. 138 capsule 0    prochlorperazine (COMPAZINE) 10 MG tablet TAKE 1 TABLET BY MOUTH EVERY 6 HOURS AS NEEDED FOR NAUSEA OR VOMITING. (Patient not taking: Reported on 02/25/2022)  No facility-administered encounter medications on file as of 02/25/2022.      Allergies   No Known Allergies    Physical Exam   BP 129/77 (BP Site: Left  arm, Patient Position: Sitting, Cuff Size: Large)   Pulse 87   Temp 98.2 F (36.8 C) (Temporal)   Resp 16   Ht 1.74 m (5' 8.5")   Wt 102 kg (224 lb 12.8 oz)   SpO2 95%   BMI 33.68 kg/m      Physical Exam  Vitals reviewed.   Constitutional:       Appearance: Normal appearance.   HENT:      Head: Normocephalic and atraumatic.      Nose: Nose normal.   Eyes:      Conjunctiva/sclera: Conjunctivae normal.   Cardiovascular:      Rate and Rhythm: Normal rate.   Pulmonary:      Effort: Pulmonary effort is normal.   Skin:     General: Skin is warm and dry.   Neurological:      General: No focal deficit present.      Mental Status: He is alert and oriented to person, place, and time.   Psychiatric:         Mood and Affect: Mood normal.         Behavior: Behavior normal.         Thought Content: Thought content normal.         Judgment: Judgment normal.          Labs / Radiology   Lab and diagnostics: reviewed in Epic  Recent Labs   Lab 02/22/22  1320   WBC 7.65   Hgb 11.4*   Hematocrit 35.0*   Platelets 216              Recent Labs   Lab 02/22/22  1320   Sodium 140   Potassium 4.0   Chloride 105   CO2 30*   BUN 13.0   Creatinine 0.8   eGFR >60.0   Glucose 126*   Calcium 9.4     Recent Labs   Lab 02/22/22  1320   Bilirubin, Total 0.3   Protein, Total 6.9   Albumin 3.2*   ALT 10   AST (SGOT) 11          No results found.      Assessment & Plan   Impression:  [name] [name] is a 71 y.o. male with epithelioid mesothelioma receiving ipilimumab and nivolumab. Referred to palliative care for pain and medical cannabis certification.      Recommendations   1. Mesothelioma  Medical Oncologist: Dr Fernanda Drum  Current Tx: ipilimumab   Continue management per oncology    2. Goals of Care:   Introduced Palliative Care services to patient. Discussed role of palliative care to assist with symptom management and focus on quality of life and personalized goals of care.  Will continue values assessment to facilitate potential future  goals of care discussions     ACP Validation: Not addressed  Surrogate Decision Maker: Next of Kin status: wife: [name] [name]  ACP Document: None  CODE STATUS: full code    3. Cancer Related Pain  Pain location and description: R shoulder which which radiates from the top of his shoulder blade down over his shoulder  Trial non Pharm interventions as indicated (I.e. hot/cold packs; repositioning; activity; spiritual care; relaxation techniques)   Pt was provided with a written certification for medical use of cannabis  after discussion.    Start Lyrica 25 mg PO BID; increase by 25 mg every 4-5 days as tolerated starting with evening dose until goal of 75 mg BID reached    4. Non-Pain Symptoms:  Nausea/Vomiting:   - Zofran 8 mg PO q8h PRN  - Compazine 10 mg PO q6h PRN    Mood:    - Anxiety  - Medical cannabis may help with anxiety    Insomnia:    - Medical cannabis may help with insomnia    5. Psychosocial: Lives wife wife. Has two adult children. One son lives at home and one in [place].  - Recommend [facility] resources        Outcomes: Improved pain intervention and Improved non-pain symptom therapy  Follow up: PRN    A total of 45 minutes were spent in care of [name] [name] including chart review, face to face evaluation, and management. Plan discussed with patient, family, and referring provider with recommendations as above.    Janett Billow, FNP  Palliative Medicine  [facility]          [facility] Palliative Medicine & Comprehensive Care Consultation     MEDICAL CANNABIS EVALUATION     Date:  February 25, 2022  Name:  [name] [name] is a 71 y.o.male with a history of :  Mesothelioma    Current anticancer treatment:  ipilimumab     Pt seen today for a discussion of medical cannabis for pain & sleep         Discussed potential benefit of    cannabis for the following     conditions:    [x]  Improve pain    [x]  Improve sleep disorders   []  Increase  appetite   []  Treat nausea and vomiting due to cancer treatments   [x]  Mood disorders (anxiety)          Potential or relative     contraindications to      cannabis not limited to the    following conditions and/ or    medications:          []  Cardiac, liver (mod-severeAVOID) or renal disease    []  Severe immunosuppression (risk for fungal/mold contamination)   []  Psychiatric illness (psychosis or family h/o psychosis)   []  Immunotherapy (immune checkpoint inhibitors)- risk for decreased clinical response to ICIs (i.e. Keytruda, Opdivo, Tecentriq, etc)   []  Warfarin   []  Other medications:  Disulfiram, metronidazole, amphetamines, atropine, pseudoephedrine, benzodiazepines, sleep medications (zolpidem) SSRIs/SNRI, cardiac meds            Potential adverse effects of     cannabis include:           Increased appetite, nausea, vomiting to include     cannabinoid hyperemesis syndrome, flushing    cardiovascular effects (hypotension or hypertension, elevated HR),     drug-drug interactions, liver toxicity     Psychoactive effects may include dizziness, drowsiness, anxiety,    paranoia, hallucinations, confusion,  memory changes,    difficulty focusing, restlessness, changes in mood    (euphoria or depression), insomnia.       Risk for dependency and cannabis-use disorder.             [place] PMP reviewed: High-risk medications include: oxycodone  Other concerns addressed: space cannabis from other sedating medications  Osteoarthritic pain (CBD alone); neuropathic pain (CBD/THC)    Preliminary data suggest cannabis users may  have a decreased response rate to immunotherapy and risk for decreased time to disease progression.  However, additional prospective studies are needed to understand the association between cannabis use and immunotherapy efficacy    Furthermore, we discussed that current evidence supporting the use of medical cannabis remains limited to mainly preclinical trials or animal studies. Pt also made aware that  he/she should review any potential side effects of cannabis with his primary oncology team.     After discussion of potential risks/ benefits of medical cannabis and review of pertinent medical history, Phenix Vandermeulen was provided with a written certification for medical cannabis.      Discussed importance of securing medical cannabis to prevent risk to household members and risk for diversion.      Additional information regarding [place] state laws governing the possession and use of recreational and medical cannabis provided through this link:  [email or web address]    If interested, a physical medical cannabis card can be obtained by completing an application with the [place].  Medical cannabis cards are no longer required to access a dispensary.    All questions answered.      Duffy Rhody, MSN, FNP-BC, OCN  Palliative Medicine  [facility]

## 2022-02-28 ENCOUNTER — Encounter: Payer: Self-pay | Admitting: Medical Oncology

## 2022-03-01 ENCOUNTER — Telehealth: Payer: Self-pay

## 2022-03-01 ENCOUNTER — Encounter: Payer: Self-pay | Admitting: Medical Oncology

## 2022-03-01 ENCOUNTER — Ambulatory Visit (FREE_STANDING_LABORATORY_FACILITY): Payer: Medicare Other | Admitting: Family Medicine

## 2022-03-01 ENCOUNTER — Encounter (INDEPENDENT_AMBULATORY_CARE_PROVIDER_SITE_OTHER): Payer: Self-pay | Admitting: Family Medicine

## 2022-03-01 VITALS — BP 129/78 | HR 82 | Temp 98.2°F | Resp 20 | Ht 68.0 in | Wt 225.0 lb

## 2022-03-01 DIAGNOSIS — R319 Hematuria, unspecified: Secondary | ICD-10-CM

## 2022-03-01 DIAGNOSIS — N3001 Acute cystitis with hematuria: Secondary | ICD-10-CM

## 2022-03-01 LAB — MCKESSON POCT UA 120
Glucose UA: NEGATIVE
Nitrite UA: POSITIVE — AB
Specific Gravity UA: >=1.03
pH UA: 6.5

## 2022-03-01 MED ORDER — CIPROFLOXACIN HCL 500 MG PO TABS
500.0000 mg | ORAL_TABLET | Freq: Two times a day (BID) | ORAL | 0 refills | Status: AC
Start: 2022-03-01 — End: 2022-03-08

## 2022-03-01 NOTE — Progress Notes (Signed)
URGENT  CARE  PROGRESS NOTE     Patient: [name] [name]   Date: 03/01/2022   MRN: [id number]       [name] [name] is a 71 y.o. male      HISTORY     History obtained from: Patient    Chief Complaint   Patient presents with    Urinary Tract Infection Symptoms     Blood in urine starting last night around midnight          Urinary Tract Infection Symptoms   This is a new problem. The current episode started yesterday. The problem occurs every urination. The problem has been unchanged. Associated symptoms include hematuria. He has tried nothing for the symptoms.        Review of Systems   Genitourinary:  Positive for hematuria.   All other systems reviewed and are negative.      History:    Pertinent Past Medical, Surgical, Family and Social History were reviewed.        Current Outpatient Medications:     acetaminophen (TYLENOL) 500 MG tablet, Take 2 tablets (1,000 mg) by mouth every 6 (six) hours as needed, Disp: , Rfl:     albuterol sulfate HFA (PROVENTIL) 108 (90 Base) MCG/ACT inhaler, TAKE 2 PUFFS BY MOUTH EVERY 6 HOURS AS NEEDED FOR WHEEZE OR SHORTNESS OF BREATH (Patient not taking: Reported on 02/25/2022), Disp: , Rfl:     aspirin 81 MG EC tablet, Take 1 tablet (81 mg) by mouth daily, Disp: , Rfl:     atorvastatin (LIPITOR) 80 MG tablet, Take 1 tablet (80 mg) by mouth daily, Disp: , Rfl:     ciprofloxacin (CIPRO) 500 MG tablet, Take 1 tablet (500 mg) by mouth 2 (two) times daily for 7 days, Disp: 14 tablet, Rfl: 0    glucosamine-chondroitin 500-400 MG tablet, Take 1 tablet by mouth daily, Disp: , Rfl:     L-Lysine 1000 MG Tab, Take 1-2 tablets (1,000-2,000 mg) by mouth, Disp: , Rfl:     lidocaine-prilocaine (EMLA) cream, Apply to the Port-A-Cath site 30-60-minute before chemotherapy., Disp: , Rfl:     ondansetron (ZOFRAN) 8 MG tablet, Take 1 tablet (8 mg) by mouth every 8 hours as needed for nausea and/or vomiting. (Patient not taking: Reported on 02/25/2022), Disp: 30 tablet, Rfl: 1    oxyCODONE (ROXICODONE) 5  MG immediate release tablet, Take 1 tablet (5 mg) by mouth every 4 (four) hours as needed for Pain (Patient not taking: Reported on 02/25/2022), Disp: 90 tablet, Rfl: 0    Oxymetazoline HCl (Nasal Spray) 0.05 % Solution, 1 spray by Nasal route, Disp: , Rfl:     pregabalin (LYRICA) 25 MG capsule, Take 1 capsule (25 mg) by mouth 2 (two) times daily for 7 days, THEN 2 capsules (50 mg) 2 (two) times daily for 7 days, THEN 3 capsules (75 mg) 2 (two) times daily for 16 days., Disp: 138 capsule, Rfl: 0    prochlorperazine (COMPAZINE) 10 MG tablet, TAKE 1 TABLET BY MOUTH EVERY 6 HOURS AS NEEDED FOR NAUSEA OR VOMITING. (Patient not taking: Reported on 02/25/2022), Disp: , Rfl:     terazosin (HYTRIN) 10 MG capsule, Take 1 capsule (10 mg) by mouth daily, Disp: , Rfl:     UNABLE TO FIND, Mushroom super blend (immunity and brain supplement) daily, Disp: , Rfl:     No Known Allergies    Medications and Allergies reviewed.    PHYSICAL EXAM     Vitals:  03/01/22 1047   BP: 129/78   Pulse: 82   Resp: 20   Temp: 98.2 F (36.8 C)   SpO2: 96%   Weight: 102.1 kg (225 lb)   Height: 1.727 m (5\' 8" )       Physical Exam  Constitutional:       Appearance: Normal appearance.   Cardiovascular:      Rate and Rhythm: Normal rate and regular rhythm.      Pulses: Normal pulses.      Heart sounds: Normal heart sounds.   Pulmonary:      Effort: Pulmonary effort is normal.      Breath sounds: Normal breath sounds.   Abdominal:      General: Bowel sounds are normal.      Palpations: Abdomen is soft.      Tenderness: There is no abdominal tenderness. There is no right CVA tenderness or left CVA tenderness.   Neurological:      Mental Status: He is alert.   Vitals reviewed.         UCC COURSE     LABS  The following POCT tests were ordered, reviewed and discussed with the patient/family.     Results       Procedure Component Value Units Date/Time    UA [[id number]]  (Abnormal) Collected: 03/01/22 1102    Specimen: Clean Catch Updated: 03/01/22 1104      Color, UA Other     Clarity, UA Clear     Leukocytes UA 3+ (500 Leu/ul)     Nitrite UA Positive     Urobilinogen UA 2+ (4mg /dl)     Protein UA 3+ (300mg /dl)     pH UA 6.5     Blood UA 3+ (200 Ery/ul)     Specific Gravity UA >=1.030     Ketone UA 1+ (15mg /dl)     Bilirubin UA 2+ (622md/dl)     Glucose UA Negative            There were no x-rays reviewed with this patient during the visit.    No current facility-administered medications for this visit.       PROCEDURES     Procedures    MEDICAL DECISION MAKING     History, physical, labs/studies most consistent with hematuria as the diagnosis.    Chart Review:  Prior PCP, Specialist and/or ED notes reviewed today: No  Prior labs/images/studies reviewed today: No    Differential Diagnosis:       ASSESSMENT     Encounter Diagnoses   Name Primary?    Hematuria, unspecified type Yes    Acute cystitis with hematuria             PLAN      PLAN: -Patient to follow up with primary care doctor or follow up here if symptoms worsening or not resolving as expected.   -Follow up with ED with acute worsening of symptoms.  -Patient verbalized understanding.    -Reviewed discharge instructions that are included here with patient, and printed in AVS.   -Questions answered.  -Risks and benefits of therapy discussed.  Pt agrees and understands.        Orders Placed This Encounter   Procedures    Urine culture    Ambulatory referral to Urology    UA     Requested Prescriptions     Signed Prescriptions Disp Refills    ciprofloxacin (CIPRO) 500 MG tablet 14 tablet 0     Sig: Take  1 tablet (500 mg) by mouth 2 (two) times daily for 7 days       Discussed results and diagnosis with patient/family.  Reviewed warning signs for worsening condition, as well as, indications for follow-up with primary care physician and return to urgent care clinic.   Patient/family expressed understanding of instructions.     An After Visit Summary was provided to the patient.

## 2022-03-01 NOTE — Telephone Encounter (Signed)
Telephone call from patient requesting a call back. Stated that since last night he is urinating a very dark with blood clots  as described. No fever, no burning or painful sensation. Advised to drink more fluids. Last Chemo infusion 6/19.Patient will send a picture on my chart.    He can be reached at [phone]

## 2022-03-01 NOTE — Telephone Encounter (Signed)
Rn called and was able to speak to Mrs. Pallone. Patient was in the shower at the time of the phone call but was able to answer triage questions. Patient c/o hematuria that began last night. He stated it was dark red, moderate amount of blood and two large clots. Patient's wife described the amount of blood patient is passing is constant and moderate amount. She described it was "like when a male has her menstrual cycle."Patient stated with the passing of the clots he experienced severe pain in his penis. Patient denies dizziness, lightheadedness, continuous pain, flank pain and fever. Rn advised patient and wife to see urgent care. RN to call patient later today to follow-up with patient. Patient and his wife agreed to plan and will see urgent care as soon as possible.

## 2022-03-01 NOTE — Telephone Encounter (Signed)
This RN called patient to follow-up after the triage call this morning. Patient stated he went to urgent care and diagnosed him with a urinary tract infection. Antibiotics were prescribed and a urology referral given. Patient to f/u with referral provider and pick up his medications this afternoon. Patient stated he had no other concerns and agreed with the plan of care he urgent care provider made.

## 2022-03-08 ENCOUNTER — Other Ambulatory Visit: Payer: Self-pay

## 2022-03-08 DIAGNOSIS — C459 Mesothelioma, unspecified: Secondary | ICD-10-CM

## 2022-03-08 NOTE — Progress Notes (Signed)
Patient to make urology referral given by [facility].

## 2022-03-10 ENCOUNTER — Other Ambulatory Visit: Payer: Self-pay

## 2022-03-10 DIAGNOSIS — R319 Hematuria, unspecified: Secondary | ICD-10-CM

## 2022-03-10 NOTE — Progress Notes (Signed)
Urology referral placed. Patient to reach out to [facility] urology clinic to set up appointment.

## 2022-03-12 ENCOUNTER — Other Ambulatory Visit: Payer: Self-pay

## 2022-03-12 ENCOUNTER — Encounter (INDEPENDENT_AMBULATORY_CARE_PROVIDER_SITE_OTHER): Payer: Self-pay | Admitting: Nurse Practitioner

## 2022-03-12 ENCOUNTER — Ambulatory Visit (INDEPENDENT_AMBULATORY_CARE_PROVIDER_SITE_OTHER): Payer: Medicare Other | Admitting: Nurse Practitioner

## 2022-03-12 ENCOUNTER — Ambulatory Visit: Payer: Medicare Other | Admitting: Medical Oncology

## 2022-03-12 ENCOUNTER — Other Ambulatory Visit (INDEPENDENT_AMBULATORY_CARE_PROVIDER_SITE_OTHER): Payer: Self-pay | Admitting: Nurse Practitioner

## 2022-03-12 DIAGNOSIS — C450 Mesothelioma of pleura: Secondary | ICD-10-CM

## 2022-03-12 DIAGNOSIS — R319 Hematuria, unspecified: Secondary | ICD-10-CM

## 2022-03-12 DIAGNOSIS — C459 Mesothelioma, unspecified: Secondary | ICD-10-CM

## 2022-03-12 NOTE — Patient Instructions (Addendum)
Today we discussed the fact that you have blood in the urine (Hematuria - see below)    Your urine will be sent for a cytology test today - to look for any abnormal cells.   - This test takes about 3-5 business days to come back    Schedule CT urogram to be done prior to next office visit.  Call [facility] at [phone] to schedule radiology test(s).    Schedule office cystoscopy (see below for details) to be done at the next office visit in 1 week.    Because you do not empty your bladder well please follow instructions below:  Timed voiding: urinate every 2-3 hours during the day or sooner if needed  Double voiding: urinate then 5 minutes after urinate again during the day        HEMATURIA    The American Urological Association (AUA) recommends a definition of significant microscopic hematuria as three or more red blood cells per high-power microscopic field in urinary sediment from two of three properly collected urinalysis specimens.  Gross hematuria is the term applied in situations where there is enough blood to visibly discolor the urine.    Blood in the urine can originate from any of the organs of the urinary tract - kidneys, ureters, bladder, prostate (in men) and urethra.  In women, blood or discharge from the vagina may be confused for blood in the urine.    There are many reasons for blood to appear in the urine, most of which pose no risk and do not require treatment.  For example, the most common causes of hematuria in men and women are benign prostate enlargement and perimenopausal changes of the urinary system, respectively.      When other symptoms and signs of urination are present they may indicate more significant problems.  Some causes of blood in the urine (such as tumors, stones or anatomic variations) may need to be treated.          CYSTOSCOPY    Cystoscopy is a procedure that lets your doctor look directly inside your urethra and bladder. It can be used to:  Help  diagnose a problem with your urethra, bladder, or kidneys.  Take a sample (biopsy) of bladder or urethral tissue.  Treat certain problems (such as removing kidney stones).  Place a stent to bypass an obstruction.  Take special X-rays of the kidneys.  Based on the findings, your doctor may recommend other tests or treatments.  What is a cystoscope?  A cystoscope is a telescope-like instrument that contains lenses and fiberoptics (small glass wires that make bright light). The cystoscope may be straight and rigid, or flexible to bend around curves in the urethra. The doctor may look directly into the cystoscope, or project the image onto a monitor.  Getting ready  Ask your doctor if you should stop taking any medications prior to the procedure.  Ask whether you should avoid eating or drinking anything after midnight before the procedure.  Follow any other instructions your doctor gives you.    The procedure  Cystoscopy is done in the doctor's office or hospital. The doctor and a nurse are present during the procedure. It takes only a few minutes, longer if a biopsy, X-ray, or treatment needs to be done.  During the procedure:  You lie on an exam table on your back, knees bent and legs apart. You are covered with a drape.  Your urethra and the area around it are  washed. Anesthetic jelly may be applied to numb the urethra. Other pain medication is usually not needed. In some cases, you may be offered a mild sedative to help you relax. If a more extensive procedure is to be done, such as a biopsy or kidney stone removal, general anesthesia may be needed.  The cystoscope is inserted. A sterile fluid is put into the bladder to expand it. You may feel pressure from this fluid.  When the procedure is done, the cystoscope is removed.  After the procedure  If you had a sedative, general anesthesia, or spinal anesthesia, you must have someone drive you home. Once you're home:  Drink plenty of fluids.  You may have burning or  light bleeding when you urinate--this is normal.  Medications may be prescribed to ease any discomfort or prevent infection. Take these as directed.  Call your doctor if you have heavy bleeding or blood clots, burning that lasts more than a day, a fever over 100F  (38 C), or trouble urinating.   [address] [employer] [address], [place], [place] [address]. All rights reserved. This information is not intended as a substitute for professional medical care. Always follow your healthcare professional's instructions.

## 2022-03-12 NOTE — Progress Notes (Signed)
Subjective:      Patient ID: [name] [name] is a 71 y.o. male     Chief Complaint:  1) Gross Hematuria     This is a 71 years old male who presents with c/o hematuria. He had episode of gross hematuria on 02/28/2025. Went to urgent care 6/26, ucx shows no infection.He still sees blood and blood clots on and off since 6/25.  He has hx of BPH, takes Terazosin-started 7 years ago.    Hematuria Specific Risk Factors  History of prior personal cancers:  mesothelioma  Blood thinners: none   Smoking history: former  History of pelvic radiation: none   Other Major Surgical History: cholecystectomy  History of pertinent cancer in family:  none   No known Family history of urothelial cancer or Lynch Syndrome  Exposure to to benzene chemicals or aromatic amines (e.g., rubber, petrochemicals, dyes) YES asbestos ( he was working in [employer])    History of kidney stones:  none   History of frequent UTIs:  none       He has PET scan this morning which shows unremarkable kidneys and adrenals.Enlarged prostate and distended bladder.  He has mesothelioma. He was treated with chemo, and not he is on immunotherapy.    The following portions of the patient's history were reviewed and updated as appropriate: allergies, current medications, past family history, past medical history, past social history, past surgical history and problem list.    Review of Systems  As per HPI      Objective:   BP 117/73 (BP Site: Left arm, Patient Position: Sitting, Cuff Size: Medium)   Pulse 73   Wt 102.1 kg (225 lb)   BMI 34.21 kg/m   General appearance - alert, well appearing, and in no distress  Mental status - alert, oriented to person, place, and time, appropriate affect  Skin: Normal temperature, turgor and texture; no rash or ulcers  Head: Normocephalic, Atraumatic, EOMI, normal dentition       Bladder scan reviewed:  PVR = 202 ml    Lab Review    03/02/22   No growth of >1,000 CFU/ML, No further work    Radiology Review   No results found  for this or any previous visit.         Assessment:     1. Hematuria, unspecified type        We discussed hematuria diagnosis.  Gross hematuria can be a sign or symptom of many urologic diseases including but not limited to renal, ureteral and bladder cancers, nephrolithiasis, infection, vascular and renal disorders.  I reviewed the risks and benefits of full work-up to help better evaluate if any of the above conditions may apply.     PVR 202 ml. Behavioral changes to help empty his bladder better were discussed and details listed below.    Management and care for this patient was reviewed with the supervising physician.        Plan:     Patient Instructions   Today we discussed the fact that you have blood in the urine (Hematuria - see below)    Your urine will be sent for a cytology test today - to look for any abnormal cells.   - This test takes about 3-5 business days to come back    Schedule CT urogram to be done prior to next office visit.  Call [facility] at [phone] to schedule radiology test(s).    Schedule office cystoscopy (see below for  details) to be done at the next office visit in 1 week.    Because you do not empty your bladder well please follow instructions below:  Timed voiding: urinate every 2-3 hours during the day or sooner if needed  Double voiding: urinate then 5 minutes after urinate again during the day        HEMATURIA    The American Urological Association (AUA) recommends a definition of significant microscopic hematuria as three or more red blood cells per high-power microscopic field in urinary sediment from two of three properly collected urinalysis specimens.  Gross hematuria is the term applied in situations where there is enough blood to visibly discolor the urine.    Blood in the urine can originate from any of the organs of the urinary tract - kidneys, ureters, bladder, prostate (in men) and urethra.  In women, blood or discharge from the vagina may be  confused for blood in the urine.    There are many reasons for blood to appear in the urine, most of which pose no risk and do not require treatment.  For example, the most common causes of hematuria in men and women are benign prostate enlargement and perimenopausal changes of the urinary system, respectively.      When other symptoms and signs of urination are present they may indicate more significant problems.  Some causes of blood in the urine (such as tumors, stones or anatomic variations) may need to be treated.          CYSTOSCOPY    Cystoscopy is a procedure that lets your doctor look directly inside your urethra and bladder. It can be used to:  Help diagnose a problem with your urethra, bladder, or kidneys.  Take a sample (biopsy) of bladder or urethral tissue.  Treat certain problems (such as removing kidney stones).  Place a stent to bypass an obstruction.  Take special X-rays of the kidneys.  Based on the findings, your doctor may recommend other tests or treatments.  What is a cystoscope?  A cystoscope is a telescope-like instrument that contains lenses and fiberoptics (small glass wires that make bright light). The cystoscope may be straight and rigid, or flexible to bend around curves in the urethra. The doctor may look directly into the cystoscope, or project the image onto a monitor.  Getting ready  Ask your doctor if you should stop taking any medications prior to the procedure.  Ask whether you should avoid eating or drinking anything after midnight before the procedure.  Follow any other instructions your doctor gives you.    The procedure  Cystoscopy is done in the doctor's office or hospital. The doctor and a nurse are present during the procedure. It takes only a few minutes, longer if a biopsy, X-ray, or treatment needs to be done.  During the procedure:  You lie on an exam table on your back, knees bent and legs apart. You are covered with a drape.  Your urethra and the area around it are  washed. Anesthetic jelly may be applied to numb the urethra. Other pain medication is usually not needed. In some cases, you may be offered a mild sedative to help you relax. If a more extensive procedure is to be done, such as a biopsy or kidney stone removal, general anesthesia may be needed.  The cystoscope is inserted. A sterile fluid is put into the bladder to expand it. You may feel pressure from this fluid.  When the procedure is done,  the cystoscope is removed.  After the procedure  If you had a sedative, general anesthesia, or spinal anesthesia, you must have someone drive you home. Once you're home:  Drink plenty of fluids.  You may have burning or light bleeding when you urinate--this is normal.  Medications may be prescribed to ease any discomfort or prevent infection. Take these as directed.  Call your doctor if you have heavy bleeding or blood clots, burning that lasts more than a day, a fever over 100F  (38 C), or trouble urinating.   [address] [employer] [address], [place], [place] [address]. All rights reserved. This information is not intended as a substitute for professional medical care. Always follow your healthcare professional's instructions.             Orders  Orders Placed This Encounter   Procedures    CT Abdomen Pelvis W WO IV/ WO PO Cont     Standing Status:   Future     Standing Expiration Date:   09/12/2022     Scheduling Instructions:      CT IVP Please     Order Specific Question:   Clinical info for radiologist     Answer:   hematuria     Order Specific Question:   Release to patient     Answer:   Immediate    Urinalysis with microscopic     Order Specific Question:   URINE TYPE     Answer:   Clean Catch     Order Specific Question:   Release to patient     Answer:   Immediate    CBC and differential     Order Specific Question:   Release to patient     Answer:   Immediate    Comprehensive metabolic panel     Order Specific Question:   Has the patient fasted?      Answer:   No     Order Specific Question:   Release to patient     Answer:   Immediate    Hemolysis index     Has the patient fasted?->No    Bladder scan    [place] Post-voiding Residual Urine and/or Bladder Capacity

## 2022-03-14 ENCOUNTER — Encounter: Payer: Self-pay | Admitting: Medical Oncology

## 2022-03-15 ENCOUNTER — Ambulatory Visit: Payer: Medicare Other

## 2022-03-15 ENCOUNTER — Ambulatory Visit (INDEPENDENT_AMBULATORY_CARE_PROVIDER_SITE_OTHER): Payer: Medicare Other | Admitting: Medical Oncology

## 2022-03-15 ENCOUNTER — Ambulatory Visit: Payer: Medicare Other | Attending: Medical Oncology

## 2022-03-15 ENCOUNTER — Other Ambulatory Visit: Payer: Medicare Other

## 2022-03-15 VITALS — BP 101/64 | HR 70 | Temp 99.2°F | Wt 220.8 lb

## 2022-03-15 DIAGNOSIS — C459 Mesothelioma, unspecified: Secondary | ICD-10-CM | POA: Insufficient documentation

## 2022-03-15 DIAGNOSIS — R6889 Other general symptoms and signs: Secondary | ICD-10-CM | POA: Insufficient documentation

## 2022-03-15 DIAGNOSIS — Z5111 Encounter for antineoplastic chemotherapy: Secondary | ICD-10-CM | POA: Insufficient documentation

## 2022-03-15 DIAGNOSIS — Z79899 Other long term (current) drug therapy: Secondary | ICD-10-CM | POA: Insufficient documentation

## 2022-03-15 DIAGNOSIS — Z1329 Encounter for screening for other suspected endocrine disorder: Secondary | ICD-10-CM

## 2022-03-15 DIAGNOSIS — Z9189 Other specified personal risk factors, not elsewhere classified: Secondary | ICD-10-CM

## 2022-03-15 LAB — CBC AND DIFFERENTIAL
Absolute NRBC: 0 10*3/uL (ref 0.00–0.00)
Basophils Absolute Automated: 0.03 10*3/uL (ref 0.00–0.08)
Basophils Automated: 0.3 %
Eosinophils Absolute Automated: 0.35 10*3/uL (ref 0.00–0.44)
Eosinophils Automated: 4 %
Hematocrit: 29.2 % — ABNORMAL LOW (ref 37.6–49.6)
Hgb: 9.3 g/dL — ABNORMAL LOW (ref 12.5–17.1)
Immature Granulocytes Absolute: 0.03 10*3/uL (ref 0.00–0.07)
Immature Granulocytes: 0.3 %
Instrument Absolute Neutrophil Count: 6.23 10*3/uL (ref 1.10–6.33)
Lymphocytes Absolute Automated: 1.1 10*3/uL (ref 0.42–3.22)
Lymphocytes Automated: 12.6 %
MCH: 30.5 pg (ref 25.1–33.5)
MCHC: 31.8 g/dL (ref 31.5–35.8)
MCV: 95.7 fL (ref 78.0–96.0)
MPV: 9.2 fL (ref 8.9–12.5)
Monocytes Absolute Automated: 0.99 10*3/uL — ABNORMAL HIGH (ref 0.21–0.85)
Monocytes: 11.3 %
Neutrophils Absolute: 6.23 10*3/uL (ref 1.10–6.33)
Neutrophils: 71.5 %
Nucleated RBC: 0 /100 WBC (ref 0.0–0.0)
Platelets: 219 10*3/uL (ref 142–346)
RBC: 3.05 10*6/uL — ABNORMAL LOW (ref 4.20–5.90)
RDW: 16 % — ABNORMAL HIGH (ref 11–15)
WBC: 8.73 10*3/uL (ref 3.10–9.50)

## 2022-03-15 LAB — COMPREHENSIVE METABOLIC PANEL
ALT: 10 U/L (ref 0–55)
AST (SGOT): 11 U/L (ref 5–41)
Albumin/Globulin Ratio: 0.9 (ref 0.9–2.2)
Albumin: 3.2 g/dL — ABNORMAL LOW (ref 3.5–5.0)
Alkaline Phosphatase: 93 U/L (ref 37–117)
Anion Gap: 4 — ABNORMAL LOW (ref 5.0–15.0)
BUN: 11 mg/dL (ref 9.0–28.0)
Bilirubin, Total: 0.5 mg/dL (ref 0.2–1.2)
CO2: 31 mEq/L — ABNORMAL HIGH (ref 17–29)
Calcium: 9.2 mg/dL (ref 7.9–10.2)
Chloride: 102 mEq/L (ref 99–111)
Creatinine: 0.8 mg/dL (ref 0.5–1.5)
Globulin: 3.6 g/dL (ref 2.0–3.6)
Glucose: 98 mg/dL (ref 70–100)
Potassium: 4.4 mEq/L (ref 3.5–5.3)
Protein, Total: 6.8 g/dL (ref 6.0–8.3)
Sodium: 137 mEq/L (ref 135–145)
eGFR: >60 mL/min/{1.73_m2} (ref >=60)

## 2022-03-15 LAB — TSH: TSH: 1.07 u[IU]/mL (ref 0.35–4.94)

## 2022-03-15 LAB — HEMOLYSIS INDEX: Hemolysis Index: 0 Index (ref 0–24)

## 2022-03-15 LAB — CORTISOL: Cortisol: 9 ug/dL

## 2022-03-15 LAB — T4, FREE: T4 Free: 0.78 ng/dL (ref 0.69–1.48)

## 2022-03-15 MED ORDER — DIPHENHYDRAMINE HCL 50 MG/ML IJ SOLN
25.0000 mg | Freq: Once | INTRAMUSCULAR | Status: DC | PRN
Start: 2022-03-15 — End: 2022-03-15

## 2022-03-15 MED ORDER — FAMOTIDINE 10 MG/ML IV SOLN (WRAP)
20.0000 mg | Freq: Once | INTRAVENOUS | Status: DC | PRN
Start: 2022-03-15 — End: 2022-03-15

## 2022-03-15 MED ORDER — SODIUM CHLORIDE 0.9 % IV BOLUS
500.0000 mL | Freq: Once | INTRAVENOUS | Status: DC | PRN
Start: 2022-03-15 — End: 2022-03-15

## 2022-03-15 MED ORDER — EPINEPHRINE HCL 1 MG/ML ADULT ANAPHYLAXIS KIT
0.3000 mg | Freq: Once | INTRAMUSCULAR | Status: DC | PRN
Start: 2022-03-15 — End: 2022-03-15

## 2022-03-15 MED ORDER — METHYLPREDNISOLONE SODIUM SUCC 40 MG IJ SOLR (WRAP)
40.0000 mg | Freq: Once | INTRAMUSCULAR | Status: DC | PRN
Start: 2022-03-15 — End: 2022-03-15

## 2022-03-15 MED ORDER — SODIUM CHLORIDE 0.9 % IV SOLN
360.0000 mg | Freq: Once | INTRAVENOUS | Status: AC
Start: 2022-03-15 — End: 2022-03-15
  Administered 2022-03-15: 360 mg via INTRAVENOUS
  Filled 2022-03-15: qty 24

## 2022-03-15 MED ORDER — DIPHENHYDRAMINE HCL 50 MG/ML IJ SOLN
50.0000 mg | Freq: Once | INTRAMUSCULAR | Status: DC | PRN
Start: 2022-03-15 — End: 2022-03-15

## 2022-03-15 MED ORDER — SODIUM CHLORIDE 0.9 % IV SOLN
25.0000 mL/h | INTRAVENOUS | Status: DC | PRN
Start: 2022-03-15 — End: 2022-03-15
  Administered 2022-03-15: 25 mL/h via INTRAVENOUS

## 2022-03-15 MED ORDER — ALBUTEROL SULFATE (2.5 MG/3ML) 0.083% IN NEBU
2.5000 mg | INHALATION_SOLUTION | Freq: Once | RESPIRATORY_TRACT | Status: DC | PRN
Start: 2022-03-15 — End: 2022-03-15

## 2022-03-15 NOTE — Progress Notes (Signed)
[facility] - Adult Infusion   Visit Date: 03/15/2022        [name] [name] is a 71 y.o. male patient of Judieth Keens, MD    Since his last visit, he has been doing well.   Patient presents to the infusion center for Cycle2, [id number].   Overall, he states his energy level is good.  The patient is able to perform most usual functions.. His  appetite is fair - reports food does not taste good and desire to eat has lessened. Patient reports eating smaller meals . Reports constipation from medication taking for shoulder pain for which he takes laxatives occasionally. He reports cough and dyspnea on exertion. He has no other concerns. There are no social work or other referral needs at this time.    Diagnosis:  mesothelioma  Treatment on Clinical Trial: Not On Study (NOS)  [facility] - CUSTOM OP Adult - Mesothelioma - Nivolumab 360mg  (once every 3 weeks) + Ipilimumab 1 mg/kg (once every 6 weeks) until progression or unacceptable toxicity (or up to 2 years)      IV Pre Hydration: N/A    Pre-Medications: n/a    Line Type: Mediport,    Right Port and Single Port  Blood Return verified prior to administration: Yes      Cryotherapy: No      Vital Signs:  Patient Vitals for the past 12 hrs:   BP Temp Pulse   03/15/22 1254 101/64 99.2 F (37.3 C) 70      Body surface area is 2.19 meters squared.    '  Chemistry        Component Value Date/Time    NA 137 03/15/2022 1150    K 4.4 03/15/2022 1150    CL 102 03/15/2022 1150    CO2 31 (H) 03/15/2022 1150    BUN 11.0 03/15/2022 1150    CREAT 0.8 03/15/2022 1150    GLU 98 03/15/2022 1150        Component Value Date/Time    CA 9.2 03/15/2022 1150    ALKPHOS 93 03/15/2022 1150    AST 11 03/15/2022 1150    ALT 10 03/15/2022 1150    BILITOTAL 0.5 03/15/2022 1150            Lab Results   Component Value Date    WBC 8.73 03/15/2022    HGB 9.3 (L) 03/15/2022    PLT 219 03/15/2022    NEUTROABS 6.23 03/15/2022           Chemo Given within the past 12 hours:   "Chemo/Biotherapy  Given" (last 12 hours)       Date/Time Action User Medication Dose Rate Dose Volume (mL) Rate    03/15/22 1427 Stopped    GB nivolumab (OPDIVO) 360 mg in sodium chloride 0.9 % 99 mL infusion  0 mL/hr       03/15/22 1357 New Bag    GB nivolumab (OPDIVO) 360 mg in sodium chloride 0.9 % 99 mL infusion 360 mg 198 mL/hr                 Hypersensitivity Reaction Noted: No  IV Post Hydration: No, not required  Treatment Outcome:Patient tolerated treatment well.  Blood return verified after administration: Yes   Central Line flushed and deacessed.    Education: Patient/Family educated regarding the expected outcomes/side effects possible interactions of treatments and medications. Patient verbalizes understanding.    Follow-up Plan: Discharged in stable condition. Accompanied by, self. Instructed  to contact physician if any complications or unmanageable symptoms occur.    A return to the Infusion Center for the next treatment, has been scheduled.    RTC: 7/31    Astrid Drafts, RN, MSN    03/15/2022  3:26 PM

## 2022-03-15 NOTE — Progress Notes (Signed)
Medical Oncology    Date: 03/15/2022  Patient Name: [name] [name],[name] [name]  Physician: Judieth Keens, MD,   Chief complaint: Epithelioid mesothelioma    History of Present Illness:   [name] [name] is a 71 y.o. 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  His oncological history is as follows:  01/13/2021: Sought medical attention for cough.  CT of the chest showed large right-sided pleural effusion with compressive atelectasis of the right lung with mediastinal shift of contents to the left.  02/05/2021: PET/CT showed rind of hypermetabolic pleural thickening within the right hemithorax for example in the right middle lobe measuring 10 mm with SUV of 9.  Large right pleural effusion with passive atelectasis.  No evidence of distant metastases.  02/27/2021: Underwent right VATS pleural biopsy and talc pleurodesis.  Pathology showed tumor characterized by invasive sheets of epithelioid cells with malignant features in a fibrotic stroma.  Morphology and immunophenotype consistent with epithelioid mesothelioma.  03/23/2021: Initiated carboplatin pemetrexed and bevacizumab.  Following 6 cycles of carboplatin pemetrexed he was maintained on bevacizumab maintenance.  05/21/2021: CT of the chest showed numerous pleural nodules.  No mediastinal or hilar adenopathy.  Emphysematous changes.  09/06/2021: CT of the chest showed pleural studding progression.  10/08/2021: Initiated the second line treatment with ipilimumab and nivolumab.  12/21/2021: Transferred care to [facility].   03/12/2022: PET/CT showed increased nodularity in hypoinflated right lung, enlarging and increase in FDG uptake in mediastinal lymph nodes.  Increased FDG uptake in the retroperitoneal and root of mesentery lymph nodes.    Interval history   [name] [name] reports that increasing chest wall pain.  Continues to take opiates with symptomatic relief.  He also reports insidious worsening of dyspnea on exertion.      Past Medical  History:     Past Medical History:   Diagnosis Date    Arthritis     Hyperlipidemia     Lung anomaly     Malignant neoplasm        Past Surgical History:     Past Surgical History:   Procedure Laterality Date    CHOLECYSTECTOMY         Family History:     Family History   Problem Relation Age of Onset    Dementia Mother     Angina Father     Heart attack Father        Social History:     Social History     Socioeconomic History    Marital status: Married   Tobacco Use    Smoking status: Former     Packs/day: 1.50     Years: 20.00     Total pack years: 30.00     Types: Cigarettes, Cigars     Quit date: 09/06/1988     Years since quitting: 33.5    Smokeless tobacco: Never   Vaping Use    Vaping Use: Never used   Substance and Sexual Activity    Alcohol use: Yes     Alcohol/week: 3.0 standard drinks of alcohol     Types: 3 Shots of liquor per week    Drug use: Yes     Frequency: 4.0 times per week     Types: Marijuana    Sexual activity: Yes     Partners: Female     Comment: vasectomy     Social Determinants of Health     Financial Resource Strain: Low Risk  (01/24/2022)    Overall Financial Resource  Strain (CARDIA)     Difficulty of Paying Living Expenses: Not hard at all   Food Insecurity: No Food Insecurity (01/24/2022)    Hunger Vital Sign     Worried About Running Out of Food in the Last Year: Never true     Ran Out of Food in the Last Year: Never true   Transportation Needs: No Transportation Needs (01/24/2022)    PRAPARE - Therapist, art (Medical): No     Lack of Transportation (Non-Medical): No   Physical Activity: Inactive (01/24/2022)    Exercise Vital Sign     Days of Exercise per Week: 0 days     Minutes of Exercise per Session: 0 min   Stress: Stress Concern Present (01/24/2022)    [employer] of Occupational Health - Occupational Stress Questionnaire     Feeling of Stress : To some extent   Social Connections: Moderately Isolated (01/24/2022)    Social Connection and Isolation  Panel [NHANES]     Frequency of Communication with Friends and Family: More than three times a week     Frequency of Social Gatherings with Friends and Family: More than three times a week     Attends Religious Services: Never     Database administrator or Organizations: No     Attends Banker Meetings: Never     Marital Status: Married   Catering manager Violence: Not At Risk (01/24/2022)    Humiliation, Afraid, Rape, and Kick questionnaire     Fear of Current or Ex-Partner: No     Emotionally Abused: No     Physically Abused: No     Sexually Abused: No   Housing Stability: High Risk (01/24/2022)    Housing Stability Vital Sign     Unable to Pay for Housing in the Last Year: No     Number of Places Lived in the Last Year: 3     Unstable Housing in the Last Year: No       Allergies:   No Known Allergies    Medications:     Current Outpatient Medications   Medication Sig Dispense Refill    acetaminophen (TYLENOL) 500 MG tablet Take 2 tablets (1,000 mg) by mouth every 6 (six) hours as needed      albuterol sulfate HFA (PROVENTIL) 108 (90 Base) MCG/ACT inhaler       aspirin 81 MG EC tablet Take 1 tablet (81 mg) by mouth daily      atorvastatin (LIPITOR) 80 MG tablet Take 1 tablet (80 mg) by mouth daily      glucosamine-chondroitin 500-400 MG tablet Take 1 tablet by mouth daily      L-Lysine 1000 MG Tab Take 1-2 tablets (1,000-2,000 mg) by mouth      lidocaine-prilocaine (EMLA) cream Apply to the Port-A-Cath site 30-60-minute before chemotherapy.      ondansetron (ZOFRAN) 8 MG tablet Take 1 tablet (8 mg) by mouth every 8 hours as needed for nausea and/or vomiting. 30 tablet 1    oxyCODONE (ROXICODONE) 5 MG immediate release tablet Take 1 tablet (5 mg) by mouth every 4 (four) hours as needed for Pain 90 tablet 0    Oxymetazoline HCl (Nasal Spray) 0.05 % Solution 1 spray by Nasal route      pregabalin (LYRICA) 25 MG capsule Take 1 capsule (25 mg) by mouth 2 (two) times daily for 7 days, THEN 2 capsules (50 mg)  2 (two) times daily for 7  days, THEN 3 capsules (75 mg) 2 (two) times daily for 16 days. 138 capsule 0    prochlorperazine (COMPAZINE) 10 MG tablet       terazosin (HYTRIN) 10 MG capsule Take 1 capsule (10 mg) by mouth daily      UNABLE TO FIND Mushroom super blend (immunity and brain supplement) daily       No current facility-administered medications for this visit.       Review of Systems:   All other systems reviewed and are negative.    Physical Exam:   There were no vitals filed for this visit.    ECOG performance status: 1  Well-appearing 71 year old male in no acute distress  Mood and judgment appropriate  Respiratory effort normal    Laboratory:     Lab Results   Component Value Date    WBC 8.73 03/15/2022    RBC 3.05 (L) 03/15/2022    HGB 9.3 (L) 03/15/2022    HCT 29.2 (L) 03/15/2022    MCV 95.7 03/15/2022    MCH 30.5 03/15/2022    MCHC 31.8 03/15/2022    RDW 16 (H) 03/15/2022    PLT 219 03/15/2022    MPV 9.2 03/15/2022       Lab Results   Component Value Date    NEUTRO 71.5 03/15/2022     No results found for: "RETICCTPCT"  No results found for: "IRON", "FERRITIN", "TIBC"  No results found for: "FOLATE"  No results found for: "HAPTOGLOBIN", "LDH"    Lab Results   Component Value Date    GLU 126 (H) 02/22/2022    BUN 13.0 02/22/2022    EGFR >60.0 02/22/2022    NA 140 02/22/2022    K 4.0 02/22/2022    CL 105 02/22/2022    CO2 30 (H) 02/22/2022    PROT 6.9 02/22/2022    ALB 3.2 (L) 02/22/2022    GLOB 3.7 (H) 02/22/2022    ALKPHOS 97 02/22/2022    AST 11 02/22/2022    ALT 10 02/22/2022         Radiology:   Per HPI    Pathology:   Per HPI    Assessment & Plan:   [name] [name] is a 71 y.o. 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      #Encounter for management of epithelioid mesothelioma:  Reviewed with [name] [name] restaging PET scan which shows disease progression.  He has met with him [facility] mesothelioma group for consideration of  cellular therapy based trials at that time was not interested.  He has follow-up with them later this month.  Reviewed lack of meaningful systemic therapies short of the clinical trial.  He will follow-[facility] with [facility].    #Management of antineoplastic side effects:  Reviewed labs.  Tolerating immunotherapy without dose-limiting toxicities/immune related adverse events.    # Plan:  Continue ipilimumab and nivolumab  Follow-up with [facility]    #Followup:   MD visit 04/05/2022      Judieth Keens, MD  Thoracic/Sarcoma Medical Oncology      Billing:  Level 5 f/u  Number of problems addressed: 1 or more chronic illness with severe exacerbation, progression or side effects of treatment  Risk of complications: Drug therapy requiring intensive monitoring for toxicity

## 2022-03-16 ENCOUNTER — Encounter (INDEPENDENT_AMBULATORY_CARE_PROVIDER_SITE_OTHER): Payer: Self-pay | Admitting: Urology

## 2022-03-16 ENCOUNTER — Ambulatory Visit (INDEPENDENT_AMBULATORY_CARE_PROVIDER_SITE_OTHER): Payer: Medicare Other | Admitting: Urology

## 2022-03-16 DIAGNOSIS — R310 Gross hematuria: Secondary | ICD-10-CM

## 2022-03-16 DIAGNOSIS — N401 Enlarged prostate with lower urinary tract symptoms: Secondary | ICD-10-CM

## 2022-03-16 DIAGNOSIS — R3912 Poor urinary stream: Secondary | ICD-10-CM | POA: Insufficient documentation

## 2022-03-16 LAB — URINALYSIS POC
POCT Urine Bilirubin: NEGATIVE
POCT Urine Glucose: NEGATIVE mg/dL
POCT Urine Ketones: NEGATIVE mg/dL
POCT Urine Nitrites: NEGATIVE
POCT Urine Urobilibogen: 0.2 mg/dL (ref 0.0–1.0)
POCT Urine pH: 6.5 (ref 5.0–8.0)
Protein, UR POCT: 30 mg/dL — AB
Urine Specific Gravity POC: 1.015 (ref 1.001–1.035)
Urine leukocyte Esterase, POCT: NEGATIVE

## 2022-03-16 LAB — URINE MICROSCOPIC

## 2022-03-16 MED ORDER — TAMSULOSIN HCL 0.4 MG PO CAPS
0.4000 mg | ORAL_CAPSULE | Freq: Every evening | ORAL | 11 refills | Status: DC
Start: 2022-03-16 — End: 2022-04-08

## 2022-03-16 NOTE — Progress Notes (Signed)
Subjective:      Patient ID: [name] [name] is a 71 y.o. male     Reason for visit:  Chief Complaint   Patient presents with    Hematuria       1. Benign prostatic hyperplasia with weak urinary stream    2. Gross hematuria        This is a pleasant 71 year old male with a history of mesothelioma who initially saw NP Witek for evaluation of gross hematuria.  He was initially seen in urgent care 03/01/2022 for this issue.  Urine culture was negative.  He denies any urinary symptoms.  He has a long history of BPH and has been on terazosin for 7 years.  He describes a persistently weak stream.  He is a former smoker.  Reports exposure to asbestos.  He returns today for cystoscopy showing marked prostate enlargement.  He is scheduled for CTU later this week. Visit today is to discuss the neck steps.    The following portions of the patient's history were reviewed and updated as appropriate: allergies, current medications, past family history, past medical history, past social history, past surgical history and problem list.    Review of Systems  Systems reviewed per the HPI and below:     History obtained from the patient     General ROS: no fevers, or chills     Gastrointestinal ROS: no abdominal pain, change in bowel habits     Musculoskeletal ROS:  no swelling to lower extremities, no back pain     Objective:     Vitals:    03/16/22 1131   BP: 128/75   Pulse: 83         Physical Exam   Constitutional:  Well-developed, well-nourished, and in no distress.   Pulmonary/Chest: Effort normal.   Neurological: Pt is alert and oriented to person, place, and time.    Psychiatric: Mood, memory, affect and judgment normal.       Lab Review   Reviewed results as listed below. Discussed findings with patient.     Urine analysis shows   Results       Procedure Component Value Units Date/Time    Urinalysis POC [[id number]]  (Abnormal) Collected: 03/16/22 1146     Updated: 03/16/22 1143     POCT Urine Color Yellow     POCT Urine  Clarity Clear     POCT Urine pH 6.5     Urine leukocyte Esterase, POCT Negative     POCT Urine Nitrites Negative     Protein, UR POCT 30 mg/dL      POCT Urine Glucose Negative mg/dL      POCT Urine Ketones Negative mg/dL      POCT Urine Urobilibogen 0.2 mg/dL      POCT Urine Bilirubin Negative     Blood, UA POCT Moderate     Urine Specific Gravity POC 1.015                Radiology Review   Reviewed results as listed below. Discussed results with the patient and answered questions to the best of my ability.   None    Procedure:  Male Cystourethroscopy    Prior to initiating the procedure, I verified the patient's identity by name and date of birth. I reviewed medications and allergies. The procedure was explained to the patient, including the benefits and risks, and the patient or their guardian was allowed time to ask questions or express concerns. Written consent was obtained.  Any relevant testing or applicable imaging was reviewed. If visible, the site of the procedure was agreed upon by me and the patient, and marked as needed.        The patient was placed in the supine position after which the 18 French flexible cystoscope was passed into the urethral meatus with sterile lubricant jelly. The urethra was visualized upon entering. The prostatic urethra was viewed from the veru. The bladder was then entered and filled with adequate fluid to allow good visualization. The lateral, posterior and anterior walls and dome were inspected. The ureteral orifices were identified, with efflux evaluated, and the trigone and bladder neck were also visualized. The cystoscope was then removed while viewing the prostatic urethra and penile urethra in antegrade fashion.    Findings:    Meatus Open    Urethra: Open    Prostate: Trilobar hypertrophy with intravesical median lobe    Bladder: No tumors, lesions, or stones    Trabeculation with cellules      Trigone: Normal    Orifices: Obscured by median lobe      Assessment:  BPH             Assessment:       Gross hematuria  BPH with weak stream and incomplete emptying      Plan:     Patient Instructions   - Cystoscopy today showed a very enlarged prostate  - Please complete CT urogram as scheduled  - Stop terazosin  - Please start Flomax 1 tab by mouth nightly.  As we discussed, this may lower you blood pressure and cause dizziness.  Please discontinue this medication if you experience this symptom.      - Follow up 6 weeks  Orders  Orders Placed This Encounter   Procedures    Urinalysis POC    Microscopic, Urine

## 2022-03-16 NOTE — Patient Instructions (Addendum)
-   Cystoscopy today showed a very enlarged prostate  - Please complete CT urogram as scheduled  - Stop terazosin  - Please start Flomax 1 tab by mouth nightly.  As we discussed, this may lower you blood pressure and cause dizziness.  Please discontinue this medication if you experience this symptom.      - Follow up 6 weeks

## 2022-03-19 ENCOUNTER — Encounter (INDEPENDENT_AMBULATORY_CARE_PROVIDER_SITE_OTHER): Payer: Self-pay | Admitting: Nurse Practitioner

## 2022-03-19 ENCOUNTER — Ambulatory Visit
Admission: RE | Admit: 2022-03-19 | Discharge: 2022-03-19 | Disposition: A | Discharge status: Home / Self Care | Payer: Medicare Other | Source: Ambulatory Visit | Attending: Nurse Practitioner | Admitting: Nurse Practitioner

## 2022-03-19 DIAGNOSIS — R319 Hematuria, unspecified: Secondary | ICD-10-CM | POA: Insufficient documentation

## 2022-03-19 MED ORDER — TERAZOSIN HCL 10 MG PO CAPS
10.0000 mg | ORAL_CAPSULE | Freq: Every day | ORAL | 0 refills | Status: DC
Start: 2022-03-19 — End: 2022-05-13

## 2022-03-19 MED ORDER — IOHEXOL 350 MG/ML IV SOLN
145.0000 mL | Freq: Once | INTRAVENOUS | Status: AC | PRN
Start: 2022-03-19 — End: 2022-03-19
  Administered 2022-03-19: 145 mL via INTRAVENOUS

## 2022-03-28 ENCOUNTER — Emergency Department: Payer: Medicare Other

## 2022-03-28 ENCOUNTER — Inpatient Hospital Stay
Admission: EM | Admit: 2022-03-28 | Discharge: 2022-03-30 | DRG: 844 | Disposition: A | Discharge status: Home / Self Care | Payer: Medicare Other | Attending: Internal Medicine | Admitting: Internal Medicine

## 2022-03-28 DIAGNOSIS — J90 Pleural effusion, not elsewhere classified: Secondary | ICD-10-CM | POA: Diagnosis present

## 2022-03-28 DIAGNOSIS — Z20822 Contact with and (suspected) exposure to covid-19: Secondary | ICD-10-CM | POA: Diagnosis present

## 2022-03-28 DIAGNOSIS — E860 Dehydration: Secondary | ICD-10-CM | POA: Diagnosis present

## 2022-03-28 DIAGNOSIS — Z87891 Personal history of nicotine dependence: Secondary | ICD-10-CM

## 2022-03-28 DIAGNOSIS — Z95828 Presence of other vascular implants and grafts: Secondary | ICD-10-CM

## 2022-03-28 DIAGNOSIS — Z6833 Body mass index (BMI) 33.0-33.9, adult: Secondary | ICD-10-CM

## 2022-03-28 DIAGNOSIS — R0902 Hypoxemia: Secondary | ICD-10-CM | POA: Diagnosis present

## 2022-03-28 DIAGNOSIS — I498 Other specified cardiac arrhythmias: Secondary | ICD-10-CM

## 2022-03-28 DIAGNOSIS — Z7982 Long term (current) use of aspirin: Secondary | ICD-10-CM

## 2022-03-28 DIAGNOSIS — E669 Obesity, unspecified: Secondary | ICD-10-CM | POA: Diagnosis present

## 2022-03-28 DIAGNOSIS — J910 Malignant pleural effusion: Secondary | ICD-10-CM | POA: Diagnosis present

## 2022-03-28 DIAGNOSIS — R8281 Pyuria: Secondary | ICD-10-CM

## 2022-03-28 DIAGNOSIS — Z9049 Acquired absence of other specified parts of digestive tract: Secondary | ICD-10-CM

## 2022-03-28 DIAGNOSIS — E785 Hyperlipidemia, unspecified: Secondary | ICD-10-CM | POA: Diagnosis present

## 2022-03-28 DIAGNOSIS — N400 Enlarged prostate without lower urinary tract symptoms: Secondary | ICD-10-CM | POA: Diagnosis present

## 2022-03-28 DIAGNOSIS — C457 Mesothelioma of other sites: Principal | ICD-10-CM | POA: Diagnosis present

## 2022-03-28 DIAGNOSIS — M1990 Unspecified osteoarthritis, unspecified site: Secondary | ICD-10-CM | POA: Diagnosis present

## 2022-03-28 DIAGNOSIS — D649 Anemia, unspecified: Secondary | ICD-10-CM | POA: Diagnosis present

## 2022-03-28 DIAGNOSIS — Z9221 Personal history of antineoplastic chemotherapy: Secondary | ICD-10-CM

## 2022-03-28 DIAGNOSIS — Z79899 Other long term (current) drug therapy: Secondary | ICD-10-CM

## 2022-03-28 DIAGNOSIS — N390 Urinary tract infection, site not specified: Secondary | ICD-10-CM | POA: Diagnosis present

## 2022-03-28 LAB — CBC AND DIFFERENTIAL
Absolute NRBC: 0 10*3/uL (ref 0.00–0.00)
Basophils Absolute Automated: 0.05 10*3/uL (ref 0.00–0.08)
Basophils Automated: 0.6 %
Eosinophils Absolute Automated: 0.52 10*3/uL — ABNORMAL HIGH (ref 0.00–0.44)
Eosinophils Automated: 6 %
Hematocrit: 30.7 % — ABNORMAL LOW (ref 37.6–49.6)
Hgb: 9.9 g/dL — ABNORMAL LOW (ref 12.5–17.1)
Immature Granulocytes Absolute: 0.05 10*3/uL (ref 0.00–0.07)
Immature Granulocytes: 0.6 %
Instrument Absolute Neutrophil Count: 5.86 10*3/uL (ref 1.10–6.33)
Lymphocytes Absolute Automated: 1.29 10*3/uL (ref 0.42–3.22)
Lymphocytes Automated: 14.8 %
MCH: 29.3 pg (ref 25.1–33.5)
MCHC: 32.2 g/dL (ref 31.5–35.8)
MCV: 90.8 fL (ref 78.0–96.0)
MPV: 9.2 fL (ref 8.9–12.5)
Monocytes Absolute Automated: 0.93 10*3/uL — ABNORMAL HIGH (ref 0.21–0.85)
Monocytes: 10.7 %
Neutrophils Absolute: 5.86 10*3/uL (ref 1.10–6.33)
Neutrophils: 67.3 %
Nucleated RBC: 0 /100 WBC (ref 0.0–0.0)
Platelets: 316 10*3/uL (ref 142–346)
RBC: 3.38 10*6/uL — ABNORMAL LOW (ref 4.20–5.90)
RDW: 15 % (ref 11–15)
WBC: 8.7 10*3/uL (ref 3.10–9.50)

## 2022-03-28 LAB — COMPREHENSIVE METABOLIC PANEL
ALT: 11 U/L (ref 0–55)
AST (SGOT): 12 U/L (ref 5–41)
Albumin/Globulin Ratio: 0.9 (ref 0.9–2.2)
Albumin: 3 g/dL — ABNORMAL LOW (ref 3.5–5.0)
Alkaline Phosphatase: 83 U/L (ref 37–117)
Anion Gap: 10 (ref 5.0–15.0)
BUN: 11 mg/dL (ref 9.0–28.0)
Bilirubin, Total: 1.1 mg/dL (ref 0.2–1.2)
CO2: 26 mEq/L (ref 17–29)
Calcium: 9.1 mg/dL (ref 7.9–10.2)
Chloride: 99 mEq/L (ref 99–111)
Creatinine: 0.9 mg/dL (ref 0.5–1.5)
Globulin: 3.5 g/dL (ref 2.0–3.6)
Glucose: 112 mg/dL — ABNORMAL HIGH (ref 70–100)
Potassium: 4.1 mEq/L (ref 3.5–5.3)
Protein, Total: 6.5 g/dL (ref 6.0–8.3)
Sodium: 135 mEq/L (ref 135–145)
eGFR: >60 mL/min/{1.73_m2} (ref >=60)

## 2022-03-28 LAB — URINALYSIS REFLEX TO MICROSCOPIC EXAM - REFLEX TO CULTURE
Bilirubin, UA: NEGATIVE
Glucose, UA: NEGATIVE
Nitrite, UA: NEGATIVE
Protein, UR: 100 — AB
Specific Gravity UA: 1.021 (ref 1.001–1.035)
Urine pH: 5 (ref 5.0–8.0)
Urobilinogen, UA: NORMAL mg/dL (ref 0.2–2.0)

## 2022-03-28 LAB — COVID-19 (SARS-COV-2) & INFLUENZA  A/B, NAA (ROCHE LIAT)
Influenza A: NOT DETECTED
Influenza B: NOT DETECTED
SARS CoV 2 Overall Result: NOT DETECTED

## 2022-03-28 LAB — BODY FLUID CELL COUNT

## 2022-03-28 LAB — HIGH SENSITIVITY TROPONIN-I: hs Troponin-I: 5.6 ng/L

## 2022-03-28 MED ORDER — SODIUM CHLORIDE 0.9 % IV BOLUS
1000.0000 mL | Freq: Once | INTRAVENOUS | Status: AC
Start: 2022-03-28 — End: 2022-03-28
  Administered 2022-03-28: 1000 mL via INTRAVENOUS

## 2022-03-28 MED ORDER — ACETAMINOPHEN 650 MG RE SUPP
650.0000 mg | Freq: Four times a day (QID) | RECTAL | Status: DC | PRN
Start: 2022-03-28 — End: 2022-03-30

## 2022-03-28 MED ORDER — TERAZOSIN HCL 5 MG PO CAPS
10.0000 mg | ORAL_CAPSULE | Freq: Every day | ORAL | Status: DC
Start: 2022-03-29 — End: 2022-03-30
  Administered 2022-03-29 – 2022-03-30 (×2): 10 mg via ORAL
  Filled 2022-03-28 (×2): qty 2

## 2022-03-28 MED ORDER — GLUCAGON 1 MG IJ SOLR (WRAP)
1.0000 mg | INTRAMUSCULAR | Status: DC | PRN
Start: 2022-03-28 — End: 2022-03-30

## 2022-03-28 MED ORDER — ATORVASTATIN CALCIUM 20 MG PO TABS
80.0000 mg | ORAL_TABLET | Freq: Every day | ORAL | Status: DC
Start: 2022-03-29 — End: 2022-03-30
  Administered 2022-03-29 – 2022-03-30 (×2): 80 mg via ORAL
  Filled 2022-03-28 (×2): qty 4

## 2022-03-28 MED ORDER — FUROSEMIDE 10 MG/ML IJ SOLN
40.0000 mg | Freq: Once | INTRAMUSCULAR | Status: AC
Start: 2022-03-28 — End: 2022-03-28
  Administered 2022-03-28: 40 mg via INTRAVENOUS
  Filled 2022-03-28: qty 4

## 2022-03-28 MED ORDER — DEXTROSE 50 % IV SOLN
12.5000 g | INTRAVENOUS | Status: DC | PRN
Start: 2022-03-28 — End: 2022-03-30

## 2022-03-28 MED ORDER — ASPIRIN 81 MG PO TBEC
81.0000 mg | DELAYED_RELEASE_TABLET | Freq: Every day | ORAL | Status: DC
Start: 2022-03-29 — End: 2022-03-30
  Administered 2022-03-29 – 2022-03-30 (×2): 81 mg via ORAL
  Filled 2022-03-28 (×2): qty 1

## 2022-03-28 MED ORDER — HEPARIN SODIUM (PORCINE) 5000 UNIT/ML IJ SOLN
5000.0000 [IU] | Freq: Two times a day (BID) | INTRAMUSCULAR | Status: DC
Start: 2022-03-28 — End: 2022-03-30
  Administered 2022-03-28 – 2022-03-29 (×3): 5000 [IU] via SUBCUTANEOUS
  Filled 2022-03-28 (×4): qty 1

## 2022-03-28 MED ORDER — FUROSEMIDE 10 MG/ML IJ SOLN
40.0000 mg | Freq: Every day | INTRAMUSCULAR | Status: DC
Start: 2022-03-29 — End: 2022-03-28

## 2022-03-28 MED ORDER — ONDANSETRON HCL 4 MG/2ML IJ SOLN
4.0000 mg | Freq: Four times a day (QID) | INTRAMUSCULAR | Status: DC | PRN
Start: 2022-03-28 — End: 2022-03-30

## 2022-03-28 MED ORDER — STERILE WATER FOR INJECTION IJ/IV SOLN (WRAP)
1.0000 g | INTRAMUSCULAR | Status: DC
Start: 2022-03-29 — End: 2022-03-30
  Administered 2022-03-29 – 2022-03-30 (×2): 1 g via INTRAVENOUS
  Filled 2022-03-28 (×2): qty 1000

## 2022-03-28 MED ORDER — STERILE WATER FOR INJECTION IJ/IV SOLN (WRAP)
1.0000 g | Freq: Once | INTRAMUSCULAR | Status: AC
Start: 2022-03-28 — End: 2022-03-28
  Administered 2022-03-28: 1 g via INTRAVENOUS
  Filled 2022-03-28: qty 1000

## 2022-03-28 MED ORDER — GLUCOSE 40 % PO GEL (WRAP)
15.0000 g | ORAL | Status: DC | PRN
Start: 2022-03-28 — End: 2022-03-30

## 2022-03-28 MED ORDER — CEPHALEXIN 500 MG PO CAPS
500.0000 mg | ORAL_CAPSULE | Freq: Once | ORAL | Status: DC
Start: 2022-03-28 — End: 2022-03-28
  Administered 2022-03-28: 500 mg via ORAL
  Filled 2022-03-28: qty 1

## 2022-03-28 MED ORDER — CEPHALEXIN 500 MG PO CAPS
500.0000 mg | ORAL_CAPSULE | Freq: Four times a day (QID) | ORAL | 0 refills | Status: DC
Start: 2022-03-28 — End: 2022-04-01

## 2022-03-28 MED ORDER — MELATONIN 3 MG PO TABS
3.0000 mg | ORAL_TABLET | Freq: Every evening | ORAL | Status: DC | PRN
Start: 2022-03-28 — End: 2022-03-30

## 2022-03-28 MED ORDER — NALOXONE HCL 0.4 MG/ML IJ SOLN (WRAP)
0.2000 mg | INTRAMUSCULAR | Status: DC | PRN
Start: 2022-03-28 — End: 2022-03-30

## 2022-03-28 MED ORDER — DEXTROSE 10 % IV BOLUS
12.5000 g | INTRAVENOUS | Status: DC | PRN
Start: 2022-03-28 — End: 2022-03-30

## 2022-03-28 MED ORDER — ACETAMINOPHEN 325 MG PO TABS
650.0000 mg | ORAL_TABLET | Freq: Four times a day (QID) | ORAL | Status: DC | PRN
Start: 2022-03-28 — End: 2022-03-30

## 2022-03-28 NOTE — ED Notes (Signed)
Attempt to collect urine, pt states he cant pee and doesn't want to try.

## 2022-03-28 NOTE — Plan of Care (Signed)
Patient alert and oriented x 4, up to restroom w/assist of 1.  Denies chest pain, headache, sob.  Patient fatigued.  Oriented to room/unit.  Dinner ordered.        Problem: Moderate/High Fall Risk Score >5  Goal: Patient will remain free of falls  Outcome: Progressing  Flowsheets (Taken 03/28/2022 1717)  Moderate Risk (6-13): MOD-Remain with patient during toileting     Problem: Inadequate Tissue Perfusion-Venous  Goal: Tissue perfusion is adequate-venous  Outcome: Progressing  Flowsheets (Taken 03/28/2022 1826)  Tissue perfusion is adequate-venous:   Increase activity as tolerated / progressive mobility   Teach/review/reinforce ankle pump exercises

## 2022-03-28 NOTE — ED Provider Notes (Signed)
[facility]    EMERGENCY DEPARTMENT HISTORY AND PHYSICAL EXAM    Date: 03/28/2022  Patient Name: [facility]  Attending Physician: Lucille Passy, MD  Patient DOB:  [birth year]  MRN:  [id number]  Room:  03/A03    Patient was evaluated by ED physician, Dr Lourdes Sledge, at 11:08 AM    History     Chief Complaint   Patient presents with    Dehydration          The patient [name] [name], is a 71 y.o. male who presents with history of hyperlipidemia mesothelioma on immunotherapy with last dose 2 weeks ago presents with generalized weakness for the past 4 days.  He feels nauseous but no vomiting.  No worsening shortness of breath.  No blood or black in stool.  He notes some chills but no fever noted.  No other complaints.  PCP:  Pcp, None, MD      Past Medical History       Past Medical History:   Diagnosis Date    Arthritis     Hyperlipidemia     Lung anomaly     Malignant neoplasm          Past Surgical History       Past Surgical History:   Procedure Laterality Date    CHOLECYSTECTOMY           Family History    Family History   Problem Relation Age of Onset    Dementia Mother     Angina Father     Heart attack Father        Social History    Social History     Socioeconomic History    Marital status: Married     Spouse name: None    Number of children: None    Years of education: None    Highest education level: None   Occupational History    None   Tobacco Use    Smoking status: Former     Packs/day: 1.50     Years: 20.00     Total pack years: 30.00     Types: Cigarettes, Cigars     Quit date: 09/06/1988     Years since quitting: 33.5    Smokeless tobacco: Never   Vaping Use    Vaping Use: Never used   Substance and Sexual Activity    Alcohol use: Yes     Alcohol/week: 3.0 standard drinks of alcohol     Types: 3 Shots of liquor per week    Drug use: Yes     Frequency: 4.0 times per week     Types: Marijuana    Sexual activity: Yes     Partners: Female     Comment: vasectomy   Other Topics Concern     None   Social History Narrative    None     Social Determinants of Health     Financial Resource Strain: Low Risk  (01/24/2022)    Overall Financial Resource Strain (CARDIA)     Difficulty of Paying Living Expenses: Not hard at all   Food Insecurity: No Food Insecurity (01/24/2022)    Hunger Vital Sign     Worried About Running Out of Food in the Last Year: Never true     Ran Out of Food in the Last Year: Never true   Transportation Needs: No Transportation Needs (01/24/2022)    PRAPARE - Therapist, art (Medical): No  Lack of Transportation (Non-Medical): No   Physical Activity: Inactive (01/24/2022)    Exercise Vital Sign     Days of Exercise per Week: 0 days     Minutes of Exercise per Session: 0 min   Stress: Stress Concern Present (01/24/2022)    [employer] of Occupational Health - Occupational Stress Questionnaire     Feeling of Stress : To some extent   Social Connections: Moderately Isolated (01/24/2022)    Social Connection and Isolation Panel [NHANES]     Frequency of Communication with Friends and Family: More than three times a week     Frequency of Social Gatherings with Friends and Family: More than three times a week     Attends Religious Services: Never     Database administrator or Organizations: No     Attends Banker Meetings: Never     Marital Status: Married   Catering manager Violence: Not At Risk (01/24/2022)    Humiliation, Afraid, Rape, and Kick questionnaire     Fear of Current or Ex-Partner: No     Emotionally Abused: No     Physically Abused: No     Sexually Abused: No   Housing Stability: High Risk (01/24/2022)    Housing Stability Vital Sign     Unable to Pay for Housing in the Last Year: No     Number of Places Lived in the Last Year: 3     Unstable Housing in the Last Year: No       Allergies    No Known Allergies      Current/Home Medications    Current/Home Medications    ACETAMINOPHEN (TYLENOL) 500 MG TABLET    Take 2 tablets (1,000 mg)  by mouth every 6 (six) hours as needed    ALBUTEROL SULFATE HFA (PROVENTIL) 108 (90 BASE) MCG/ACT INHALER        ASPIRIN 81 MG EC TABLET    Take 1 tablet (81 mg) by mouth daily    ATORVASTATIN (LIPITOR) 80 MG TABLET    Take 1 tablet (80 mg) by mouth daily    GLUCOSAMINE-CHONDROITIN 500-400 MG TABLET    Take 1 tablet by mouth daily    L-LYSINE 1000 MG TAB    Take 1-2 tablets (1,000-2,000 mg) by mouth    LIDOCAINE-PRILOCAINE (EMLA) CREAM    Apply to the Port-A-Cath site 30-60-minute before chemotherapy.    ONDANSETRON (ZOFRAN) 8 MG TABLET    Take 1 tablet (8 mg) by mouth every 8 hours as needed for nausea and/or vomiting.    OXYMETAZOLINE HCL (NASAL SPRAY) 0.05 % SOLUTION    1 spray by Nasal route    PREGABALIN (LYRICA) 25 MG CAPSULE    Take 1 capsule (25 mg) by mouth 2 (two) times daily for 7 days, THEN 2 capsules (50 mg) 2 (two) times daily for 7 days, THEN 3 capsules (75 mg) 2 (two) times daily for 16 days.    PROCHLORPERAZINE (COMPAZINE) 10 MG TABLET        TAMSULOSIN (FLOMAX) 0.4 MG CAP    Take 1 capsule (0.4 mg) by mouth nightly    TERAZOSIN (HYTRIN) 10 MG CAPSULE    Take 1 capsule (10 mg) by mouth daily    UNABLE TO FIND    Mushroom super blend (immunity and brain supplement) daily       Vital Signs     BP 127/72   Pulse 83   Temp 98.4 F (36.9 C) (Oral)  Resp 21   Ht 5\' 8"  (1.727 m)   Wt 98.9 kg   SpO2 94%   BMI 33.15 kg/m   Patient Vitals for the past 24 hrs:   BP Temp Temp src Pulse Resp SpO2 Height Weight   03/28/22 1300 127/72 -- -- 83 21 94 % -- --   03/28/22 1230 -- -- -- 82 18 90 % -- --   03/28/22 1203 117/58 -- -- 83 21 93 % -- --   03/28/22 1106 129/60 -- -- 85 -- 92 % -- --   03/28/22 1054 125/71 98.4 F (36.9 C) Oral 82 -- 93 % -- --   03/28/22 1054 -- -- -- -- -- -- 5\' 8"  (1.727 m) 98.9 kg         Review of Systems     Review of Systems   Constitutional:  Positive for chills and malaise/fatigue. Negative for fever.   All other systems reviewed and are negative.      Physical Exam           CONSTITUTIONAL   Vital signs reviewed, Patient appears ill-appearing pale elderly male in no respiratory distress noted comfortable, Alert and oriented X 3.  HEAD   Atraumatic, Normocephalic.  EYES   Eyes are normal to inspection, Pupils equal, round and reactive to light, No discharge from eyes, Extraocular muscles intact, Sclera are normal, Conjunctiva normal.  ENT Mouth normal to Inspection.  Pale mucous membranes are noted  NECK   Normal ROM. No jugular venous distention, No meningeal signs.  RESPIRATORY Chest is nontender, Breath sounds with rhonchi at the bases most on the right-hand side, No respiratory distress  CARDIOVASCULAR   RRR, No murmurs, Normal S1 S2, Rhythm is normal.  ABDOMEN   Abdomen is nontender, No masses, Bowel sounds normal, No distension, No peritoneal signs.  BACK  There is no CVA Tenderness, Normal inspection.  UPPER EXTREMITY   Inspection normal, No cyanosis, No edema.  LOWER EXTREMITY   Inspection normal, No cyanosis, No edema.  NEURO   GCS is 15, Speech normal. 5/5 ms and Nl sensation B. No cerebellar deficits. Nl cranial exam  SKIN Skin is warm, Skin is dry, Skin is normal color.  LYMPHATIC   No adenopathy in neck.  PSYCHIATRIC Oriented X 3, Normal affect.         ED Medication Orders     ED Medication Orders (From admission, onward)      Start Ordered     Status Ordering Provider    03/28/22 1444 03/28/22 1443  cefTRIAXone (ROCEPHIN) 1 g in sterile water (preservative free) 10 mL IV push injection  Once        Route: Intravenous  Ordered Dose: 1 g       Ordered [name] [name] A    03/28/22 1442 03/28/22 1441  furosemide (LASIX) injection 40 mg  Once        Route: Intravenous  Ordered Dose: 40 mg       Ordered [name] [name] A    03/28/22 1402 03/28/22 1401    Once        Route: Oral  Ordered Dose: 500 mg       Discontinued [name] [name] A    03/28/22 1211 03/28/22 1210  sodium chloride 0.9 % bolus 1,000 mL  Once        Route: Intravenous  Ordered Dose: 1,000 mL       Last MAR action:  New Bag [name] [name] A  Orders Placed During this Encounter     Orders Placed This Encounter   Procedures    COVID-19 (SARS-CoV-2) and Influenza A/B, NAA ([facility] Rapid)- Admission    Urine culture    Chest AP Portable    CT Chest without Contrast    CBC and differential    Comprehensive metabolic panel    Urinalysis Reflex to Microscopic Exam- Reflex to Culture    High Sensitivity Troponin-I    Eye protection shoud be worn for all instances of  patient contact    Lab draws/medication administration should be bundled to minimize exposure risks    Contact isolation    Airborne isolation for COVID-19 without negative pressure       Diagnostic Study Results     Labs     Results       Procedure Component Value Units Date/Time    Urinalysis Reflex to Microscopic Exam- Reflex to Culture [[id number]]  (Abnormal) Collected: 03/28/22 1332    Specimen: Urine, Clean Catch Updated: 03/28/22 1355     Urine Type Urine, Clean Ca     Color, UA Amber     Clarity, UA Cloudy     Specific Gravity UA 1.021     Urine pH 5.0     Leukocyte Esterase, UA Trace     Nitrite, UA Negative     Protein, UR 100     Glucose, UA Negative     Ketones UA Trace     Urobilinogen, UA Normal mg/dL      Bilirubin, UA Negative     Blood, UA Small     RBC, UA 11 - 25 /hpf      WBC, UA TNTC /hpf      Urine Mucus Present    High Sensitivity Troponin-I [[id number]] Collected: 03/28/22 1111     Updated: 03/28/22 1143     hs Troponin-I 5.6 ng/L     COVID-19 (SARS-CoV-2) and Influenza A/B, NAA ([facility] Rapid)- Admission [[id number]] Collected: 03/28/22 1111    Specimen: Culturette from Nasopharyngeal Updated: 03/28/22 1140     Purpose of COVID testing Diagnostic -[facility]     SARS-CoV-2 Specimen Source Nasal Swab     SARS CoV 2 Overall Result Not Detected     Influenza A Not Detected     Influenza B Not Detected    Narrative:      o Collect and clearly label specimen type:  o PREFERRED-Upper respiratory specimen: One Nasal Swab in  Transport Media.  o Hand deliver to  laboratory ASAP  Diagnostic -[facility]    Comprehensive metabolic panel [[id number]]  (Abnormal) Collected: 03/28/22 1111    Specimen: Blood Updated: 03/28/22 1135     Glucose 112 mg/dL      BUN 27.2 mg/dL      Creatinine 0.9 mg/dL      Sodium 536 mEq/L      Potassium 4.1 mEq/L      Chloride 99 mEq/L      CO2 26 mEq/L      Calcium 9.1 mg/dL      Protein, Total 6.5 g/dL      Albumin 3.0 g/dL      AST (SGOT) 12 U/L      ALT 11 U/L      Alkaline Phosphatase 83 U/L      Bilirubin, Total 1.1 mg/dL      Globulin 3.5 g/dL      Albumin/Globulin Ratio 0.9     Anion Gap 10.0     eGFR >60.0  mL/min/1.73 m2     CBC and differential [[id number]]  (Abnormal) Collected: 03/28/22 1111    Specimen: Blood Updated: 03/28/22 1121     WBC 8.70 x10 3/uL      Hgb 9.9 g/dL      Hematocrit 65.7 %      Platelets 316 x10 3/uL      RBC 3.38 x10 6/uL      MCV 90.8 fL      MCH 29.3 pg      MCHC 32.2 g/dL      RDW 15 %      MPV 9.2 fL      Instrument Absolute Neutrophil Count 5.86 x10 3/uL      Neutrophils 67.3 %      Lymphocytes Automated 14.8 %      Monocytes 10.7 %      Eosinophils Automated 6.0 %      Basophils Automated 0.6 %      Immature Granulocytes 0.6 %      Nucleated RBC 0.0 /100 WBC      Neutrophils Absolute 5.86 x10 3/uL      Lymphocytes Absolute Automated 1.29 x10 3/uL      Monocytes Absolute Automated 0.93 x10 3/uL      Eosinophils Absolute Automated 0.52 x10 3/uL      Basophils Absolute Automated 0.05 x10 3/uL      Immature Granulocytes Absolute 0.05 x10 3/uL      Absolute NRBC 0.00 x10 3/uL             Radiologic Studies  Radiology Results (24 Hour)       Procedure Component Value Units Date/Time    Chest AP Portable [[id number]] Collected: 03/28/22 1146    Order Status: Completed Updated: 03/28/22 1151    Narrative:      HISTORY: Shortness of breath.     COMPARISON: Outside chest CT images dated 09/26/2021.    FINDINGS:   A central venous port is in place. Loculated right-sided pleural fluid and  underlying atelectasis or other airspace  disease appears more extensive and  confluent than on the prior. Some of this density is volume loss, as the  trachea remains deviated rightward. The left lung is comparatively grossly  clear.      Impression:        Worsening appearance of the right hemithorax since 09/26/2021.    Al Decant, MD  03/28/2022 11:49 AM        .    Clinical Course / MDM       Notes:   ED Course as of 03/28/22 1454   Sun Mar 28, 2022   1226 Offered ct chest for worsening pleural effusion as its more notable on cxr today w current pulse ox 90-93 on RA .  I explained to him that occult pneumonias could be treated earlier with the CT chest and he defers this as he notes that he can have a CT scan of the chest and PET scan in 3 days. [AL]      ED Course User Index  [AL] Frederich Montilla A, MD   Continued hypoxia 88%.  Discussed with myself and the nurse at 2:40 PM and the patient agrees for admission for IV diuresis dry CT scan of the chest and IR evaluation for possible thoracentesis for questionable loculated right-sided pleural effusion    Discussion of abnormal results/incidental findings:       Consults: discussed with Dr. Betsy Pries will admit for hypoxia with pleural effusion  Data Review     Nursing records reviewed and agree: Yes    Pulse Oximetry Analysis - Normal  Laboratory results reviewed by EDP: Yes  Radiologic study results reviewed by EDP: Yes    Rendering Provider: Dr Lourdes Sledge    Monitors, EKG     Cardiac Monitor (interpreted by ED physician):      EKG (interpreted by ED physician):       Critical Care     Critical care exclusive of time spent performing procedures.    Total time:          Clinical Impression & Disposition     Clinical Impression:  1. Pleural effusion    2. Chronic anemia    3. Pyuria    4. Hypoxia        Disposition  ED Disposition       None            Prescriptions    New Prescriptions    CEPHALEXIN (KEFLEX) 500 MG CAPSULE    Take 1 capsule (500 mg) by mouth 4 (four) times daily for 7 days               Lucille Passy, MD  03/28/22 1454

## 2022-03-28 NOTE — ED Triage Notes (Shared)
Pt here for dehydration, feeling tired and weakness. Pt is currently on immunotherapy for Mesothelioma. No fevers, feeling SOB, decreased appetite, decreassed PO intake.

## 2022-03-28 NOTE — ED Notes (Signed)
[facility] EMERGENCY DEPARTMENT  ED NURSING NOTE FOR THE RECEIVING INPATIENT NURSE   ED NURSE mike   SPECTRALINK [phone]   ED CHARGE RN emily   ADMISSION INFORMATION   [name] [name] is a 71 y.o. male admitted with an ED diagnosis of:    1. Pleural effusion    2. Chronic anemia    3. Pyuria    4. Hypoxia         Isolation: None   Allergies: Patient has no known allergies.   Holding Orders confirmed? Yes   Belongings Documented? No   Home medications sent to pharmacy confirmed? No   NURSING CARE   Patient Comes From:   Mental Status: Home Independent  alert and oriented   ADL: Needs assistance with ADLs   Ambulation: mild difficulty   Pertinent Information  and Safety Concerns: N/a     CT / NIH   CT Head ordered on this patient?  No   NIH/Dysphagia assessment done prior to admission? No   VITAL SIGNS (at the time of this note)      Vitals:    03/28/22 1446   BP: 117/58   Pulse: 84   Resp: (!) 24   Temp: 98.4   SpO2: 94%

## 2022-03-28 NOTE — Discharge Instructions (Signed)
Thank you for choosing [facility] for your emergency care needs.   We strive to provide EXCELLENT care to you and your family.     IF YOU DO NOT CONTINUE TO IMPROVE OR YOUR CONDITION WORSENS, PLEASE CONTACT YOUR DOCTOR OR RETURN IMMEDIATELY TO THE EMERGENCY DEPARTMENT.     DOCTOR REFERRALS   Call [phone] (available 24 hours a day, 7 days a week) if you need any further referrals and we can help you find a primary care doctor or specialist. Also, available online at: [email or web address]   YOUR CONTACT INFORMATION   Before leaving please check with registration to make sure we have an up-to-date contact number. You can call registration at [phone] to update your information. For questions about your hospital bill, please call [phone]. For questions about your Emergency Dept Physician bill please call [phone].   FREE HEALTH SERVICES   If you need help with health or social services, please call 2-1-1 for a free referral to resources in your area. 2-1-1 is a free service connecting people with information on health insurance, free clinics, pregnancy, mental health, dental care, food assistance, housing, and substance abuse counseling. Also, available online at: [email or web address].org   MEDICAL RECORDS AND TESTS   Certain laboratory test results do not come back the same day, for example urine cultures. We will contact you if other important findings are noted. Radiology films are often reviewed again to ensure accuracy. If there is any discrepancy, we will notify you.   Please call [phone] to pick up a complimentary CD of any radiology studies performed. If you or your doctor would like to request a copy of your medical records, please call [phone].   ORTHOPEDIC INJURY   Please know that significant injuries can exist even when an initial x-ray is read as normal or negative. This can occur because some fractures (broken bones) are not  initially visible on x-rays. For this reason, close outpatient follow-up with your primary care doctor or bone specialist (orthopedist) is required.   MEDICATIONS AND FOLLOWUP   Please be aware that some prescription medications can cause drowsiness. Use caution when driving or operating machinery.   The examination and treatment you have received in our Emergency Department is provided on an emergency basis, and is not intended to be a substitute for your primary care physician. It is important that your doctor checks you again and that you report any new or remaining problems at that time.   24 HOUR PHARMACIES  [facility] - [address], [place], [place] [address] (1.4 miles, 7 minutes)   [facility] - [address], [place], [place] [address] (6.5 miles, 13 minutes)   Handout with directions available on request.

## 2022-03-28 NOTE — H&P (Addendum)
[facility]      Patient: [name] [name]  Date: 03/28/2022   DOB: June 08, 1951  Admission Date: 03/28/2022   MRN: [id number]  Attending: Burnett Kanaris, DO       Chief Complaint   Patient presents with    Dehydration      History Gathered From: Self    HISTORY AND PHYSICAL     Oryon Gary is a 71 y.o. 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    Patient presents with generalized weakness for the past 4 days as well as progressive SOB that he states has been slowly worsening over past several months, had previously discussed this with his oncologist. He has a history of malignant pleural effusions for which he has had thoracentesis x2 and thought he may benefit from another thoracentesis in the hospital. No acute sudden worsening SOB or chest pain, denies hemoptysis, no fevers. He previously was receiving chemotherapy last year which was switched to immunotherapy this year with [facility]. States he has been urinating more frequently for past several days, but denies dysuria. In the ED, VSS, not requiring supplemental oxygen. Labs Hgb 9.9, UA Tr LE, TNTC WBC. He was given CTX and IVF in ED. CXR initially concerning for right-sided loculated effusion, CT Chest subsequently did not reveal significant effusion, noted pleural thickening and atelectasis in right hemithorax. On my exam, patient resting comfortably in bed, no respiratory distress, denies chest pain, no prior history of blood clots.    SH: Denies tobacco, illicit drug; or alcohol use    FH:Non-contributory except as documented in history    Past Medical History:   Diagnosis Date    Arthritis     Hyperlipidemia     Lung anomaly     Malignant neoplasm        Past Surgical History:   Procedure Laterality Date    CHOLECYSTECTOMY         Prior to Admission medications    Medication Sig Start Date End Date Taking? Authorizing Provider   acetaminophen (TYLENOL) 500 MG  tablet Take 2 tablets (1,000 mg) by mouth every 6 (six) hours as needed 03/03/21   [provider]   albuterol sulfate HFA (PROVENTIL) 108 (90 Base) MCG/ACT inhaler  02/03/21   [provider]   aspirin 81 MG EC tablet Take 1 tablet (81 mg) by mouth daily    [provider]   atorvastatin (LIPITOR) 80 MG tablet Take 1 tablet (80 mg) by mouth daily 11/03/21   [provider]   cephALEXin (KEFLEX) 500 MG capsule Take 1 capsule (500 mg) by mouth 4 (four) times daily for 7 days 03/28/22 04/04/22  Lucille Passy, MD   glucosamine-chondroitin 500-400 MG tablet Take 1 tablet by mouth daily    [provider]   L-Lysine 1000 MG Tab Take 1-2 tablets (1,000-2,000 mg) by mouth    [provider]   lidocaine-prilocaine (EMLA) cream Apply to the Port-A-Cath site 30-60-minute before chemotherapy. 06/15/21   [provider]   ondansetron (ZOFRAN) 8 MG tablet Take 1 tablet (8 mg) by mouth every 8 hours as needed for nausea and/or vomiting. 01/05/22   Judieth Keens, MD   Oxymetazoline HCl (Nasal Spray) 0.05 % Solution 1 spray by Nasal route    [provider]   pregabalin (LYRICA) 25 MG capsule Take 1 capsule (25 mg) by mouth 2 (two) times daily for 7 days, THEN  2 capsules (50 mg) 2 (two) times daily for 7 days, THEN 3 capsules (75 mg) 2 (two) times daily for 16 days. 02/25/22 03/27/22  Curtis-Romonchuk, Reed Pandy, FNP   prochlorperazine (COMPAZINE) 10 MG tablet  06/12/21   [provider]   tamsulosin (FLOMAX) 0.4 MG Cap Take 1 capsule (0.4 mg) by mouth nightly 03/16/22 03/16/23  Rolef, Lilyan Gilford, MD   terazosin (HYTRIN) 10 MG capsule Take 1 capsule (10 mg) by mouth daily 03/19/22 06/17/22  Rolef, Lilyan Gilford, MD   UNABLE TO FIND Mushroom super blend (immunity and brain supplement) daily    [provider]       No Known Allergies    Family History   Problem Relation Age of Onset    Dementia Mother     Angina Father     Heart attack Father        Social  History     Tobacco Use    Smoking status: Former     Packs/day: 1.50     Years: 20.00     Total pack years: 30.00     Types: Cigarettes, Cigars     Quit date: 09/06/1988     Years since quitting: 33.5    Smokeless tobacco: Never   Vaping Use    Vaping Use: Never used   Substance Use Topics    Alcohol use: Yes     Alcohol/week: 3.0 standard drinks of alcohol     Types: 3 Shots of liquor per week    Drug use: Yes     Frequency: 4.0 times per week     Types: Marijuana       REVIEW OF SYSTEMS     Ten point review of systems negative or as per HPI and below endorsements.    PHYSICAL EXAM     Vital Signs (most recent): BP 117/58   Pulse 84   Temp 98.4 F (36.9 C) (Oral)   Resp (!) 24   Ht 1.727 m (5\' 8" )   Wt 98.9 kg (218 lb 0.6 oz)   SpO2 94%   BMI 33.15 kg/m   Constitutional: NAD, A+OX3, chronically ill-appearing   HEENT: NC/AT, no scleral icterus or conjunctival pallor, no nasal discharge, MMM  Neck: trachea midline, no masses  Cardiovascular: RRR, normal S1 S2, no murmurs  Respiratory: Normal rate. No retractions or increased work of breathing. Right basilar rales  Gastrointestinal: +BS, non-distended, soft, non-tender, no rebound or guarding, no hepatosplenomegaly  Genitourinary: no suprapubic or costovertebral angle tenderness  Musculoskeletal: No clubbing, edema, or cyanosis. Capillary refill normal  Skin: right upper chest port  Neurologic: EOMI, CN 2-12 grossly intact. no gross motor or sensory deficits,   Psychiatric: affect and mood appropriate. The patient is alert and interactive    LABS & IMAGING     Recent Results (from the past 24 hour(s))   CBC and differential    Collection Time: 03/28/22 11:11 AM   Result Value Ref Range    WBC 8.70 3.10 - 9.50 x10 3/uL    Hgb 9.9 (L) 12.5 - 17.1 g/dL    Hematocrit 16.1 (L) 37.6 - 49.6 %    Platelets 316 142 - 346 x10 3/uL    RBC 3.38 (L) 4.20 - 5.90 x10 6/uL    MCV 90.8 78.0 - 96.0 fL    MCH 29.3 25.1 - 33.5 pg    MCHC 32.2 31.5 - 35.8 g/dL    RDW 15 11 - 15 %  MPV 9.2 8.9 - 12.5 fL    Instrument Absolute Neutrophil Count 5.86 1.10 - 6.33 x10 3/uL    Neutrophils 67.3 None %    Lymphocytes Automated 14.8 None %    Monocytes 10.7 None %    Eosinophils Automated 6.0 None %    Basophils Automated 0.6 None %    Immature Granulocytes 0.6 None %    Nucleated RBC 0.0 0.0 - 0.0 /100 WBC    Neutrophils Absolute 5.86 1.10 - 6.33 x10 3/uL    Lymphocytes Absolute Automated 1.29 0.42 - 3.22 x10 3/uL    Monocytes Absolute Automated 0.93 (H) 0.21 - 0.85 x10 3/uL    Eosinophils Absolute Automated 0.52 (H) 0.00 - 0.44 x10 3/uL    Basophils Absolute Automated 0.05 0.00 - 0.08 x10 3/uL    Immature Granulocytes Absolute 0.05 0.00 - 0.07 x10 3/uL    Absolute NRBC 0.00 0.00 - 0.00 x10 3/uL   Comprehensive metabolic panel    Collection Time: 03/28/22 11:11 AM   Result Value Ref Range    Glucose 112 (H) 70 - 100 mg/dL    BUN 16.1 9.0 - 09.6 mg/dL    Creatinine 0.9 0.5 - 1.5 mg/dL    Sodium 045 409 - 811 mEq/L    Potassium 4.1 3.5 - 5.3 mEq/L    Chloride 99 99 - 111 mEq/L    CO2 26 17 - 29 mEq/L    Calcium 9.1 7.9 - 10.2 mg/dL    Protein, Total 6.5 6.0 - 8.3 g/dL    Albumin 3.0 (L) 3.5 - 5.0 g/dL    AST (SGOT) 12 5 - 41 U/L    ALT 11 0 - 55 U/L    Alkaline Phosphatase 83 37 - 117 U/L    Bilirubin, Total 1.1 0.2 - 1.2 mg/dL    Globulin 3.5 2.0 - 3.6 g/dL    Albumin/Globulin Ratio 0.9 0.9 - 2.2    Anion Gap 10.0 5.0 - 15.0    eGFR >60.0 >=60 mL/min/1.73 m2   COVID-19 (SARS-CoV-2) and Influenza A/B, NAA ([facility])- Admission    Collection Time: 03/28/22 11:11 AM    Specimen: Nasopharyngeal; Culturette   Result Value Ref Range    Purpose of COVID testing Diagnostic -PUI     SARS-CoV-2 Specimen Source Nasal Swab     SARS CoV 2 Overall Result Not Detected     Influenza A Not Detected     Influenza B Not Detected    High Sensitivity Troponin-I    Collection Time: 03/28/22 11:11 AM   Result Value Ref Range    hs Troponin-I 5.6 SEE BELOW ng/L   Urinalysis Reflex to Microscopic Exam- Reflex to Culture     Collection Time: 03/28/22  1:32 PM    Specimen: Urine, Clean Catch   Result Value Ref Range    Urine Type Urine, Clean Ca     Color, UA Amber (A) Colorless - Yellow    Clarity, UA Cloudy (A) Clear - Hazy    Specific Gravity UA 1.021 1.001 - 1.035    Urine pH 5.0 5.0 - 8.0    Leukocyte Esterase, UA Trace (A) Negative    Nitrite, UA Negative Negative    Protein, UR 100 (A) Negative    Glucose, UA Negative Negative    Ketones UA Trace (A) Negative    Urobilinogen, UA Normal 0.2 - 2.0 mg/dL    Bilirubin, UA Negative Negative    Blood, UA Small (A) Negative    RBC,  UA 11 - 25 (A) 0 - 5 /hpf    WBC, UA TNTC (A) 0 - 5 /hpf    Urine Mucus Present None       MICROBIOLOGY:  Blood Culture: not done  Urine Culture: done  Antibiotics Started: ceftriaxone    IMAGING (xray images personally reviewed and concur with radiologist unless otherwise stated below):  Chest AP Portable   Final Result      Worsening appearance of the right hemithorax since 09/26/2021.      Al Decant, MD   03/28/2022 11:49 AM      CT Chest without Contrast    (Results Pending)       CARDIAC:  EKG Interpretation (on my review 3):  sinus rhythm    Markers:  Recent Labs   Lab 03/28/22  1111   hs Troponin-I 5.6       EMERGENCY DEPARTMENT COURSE:  Orders Placed This Encounter   Procedures    COVID-19 (SARS-CoV-2) and Influenza A/B, NAA ([facility])- Admission    Urine culture    Chest AP Portable    CT Chest without Contrast    CBC and differential    Comprehensive metabolic panel    Urinalysis Reflex to Microscopic Exam- Reflex to Culture    High Sensitivity Troponin-I    Eye protection shoud be worn for all instances of  patient contact    Lab draws/medication administration should be bundled to minimize exposure risks    Full Code    Adult Admit to Observation       ASSESSMENT & PLAN     Elizer Bostic is a 71 y.o. male admitted under OBSERVATION with:    1. 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   On RA, no respiratory  distress  CXR noted loculated right pleural effusion, however minimal amount of loculated fluid noted on CT Chest, symptoms likely attributed to worsening pleural changes due to malignancy  Currently receiving Immunotherapy (Ipilimumab and Nivolumab), no recent regimen changes, [facility] consulted  Well's Score 1- low risk for PE, SOB is progressive over course of many months and not acute  Likely no role for thoracentesis, can confirm with IR in AM  Patient received dose of Lasix in ED, reports no symptomatic improvement  Monitor respiratory status, continue supportive care  Follows with [facility] as outpatient    2. UTI  3. Generalized Weakness possibly 2/2 above   No evidence of sepsis present on admission  Patient complaining of urinary frequency, although   Abnormal UA with Tr LE, TNTC WBC  Continue CTX, urine culture pending  PT/OT    4. HLD  Statin    5. BPH  Hytrin    FEN:  Fluid:  Electrolytes: Replete prn  Nutrition: Regular  .  Patient has BMI=Body mass index is 33.15 kg/m.  Diagnosis: Obesity based on BMI criteria        Body mass index is 33.15 kg/m.     Monitoring:  Continuous pulse oximetry: No  Telemetry needed:  Level of care:     CODE STATUS: full code  Surrogate decision maker or Advanced Care Plan:     PRIMARY CARE MD: Pcp, None, MD    Safety Checklist  DVT prophylaxis: Chemical and Mechanical   Foley: Not present   IVs:  Peripheral IV   PT/OT: Not needed   Daily CBC & or Chem ordered: Yes, due to clinical and lab instability     Patient Lines/Drains/Airways Status  Active PICC Line / CVC Line / PIV Line / Drain / Airway / Intraosseous Line / Epidural Line / ART Line / Line / Wound / Pressure Ulcer / NG/OG Tube       Name Placement date Placement time Site Days    Port A Cath 02/18/21 Right Subclavian 02/18/21  1200  Subclavian  403    Peripheral IV 03/28/22 18 G Standard Left Antecubital 03/28/22  1110  Antecubital  less than 1                    Fonda Kinder,  DO  03/28/2022 3:23 PM

## 2022-03-28 NOTE — ED Notes (Signed)
Attempt to collect urine, pt refuses to be able to or willing to try.

## 2022-03-28 NOTE — Progress Notes (Addendum)
Admission Note-  Patient admitted to Medical#  [id number] at 17:15. Hand off report received from ED Electronic Record.    Patient oriented to room, bed in lowest position, bed alarm activated, call bell within reach.   Fall prevention contract completed. Yes/No  Patient informed of visitor policy.  Belongings listed in flowsheet.     Brief Clinical Picture:  Neuro AXO; Resp - O2 status; Foley or Central lines; GI.     Patient admitted for pleural effusion, chronic anemia, pyuria, hypoxia.  Pivoted from ED stretcher to bed.  Oriented x 4. Un accessed Mediport to R. Chest.  Last accessed 2 weeks ago.   VSS on room air.  Denies pain.  BM / urinated.  Oriented to room/unit.  Call bell/side table, ginger ale, water, menu provided.        Skin Assessment  Two nurse skin assessment performed with: Flo, RN    Areas observed with redness or injury: None    Devices present:None   skin integrity under device: Within Normal Limits    Braden score <15. Yes/No    Actions taken: None    , N/A, wound consult entered, Optifoam placed, pressure relief, nutrition consult entered    Any significant admission event N/a

## 2022-03-29 ENCOUNTER — Encounter: Payer: Self-pay | Admitting: Medical Oncology

## 2022-03-29 DIAGNOSIS — R0902 Hypoxemia: Secondary | ICD-10-CM

## 2022-03-29 DIAGNOSIS — J90 Pleural effusion, not elsewhere classified: Secondary | ICD-10-CM

## 2022-03-29 DIAGNOSIS — D649 Anemia, unspecified: Secondary | ICD-10-CM

## 2022-03-29 DIAGNOSIS — C459 Mesothelioma, unspecified: Secondary | ICD-10-CM

## 2022-03-29 LAB — MAGNESIUM: Magnesium: 1.6 mg/dL (ref 1.6–2.6)

## 2022-03-29 LAB — ECG 12-LEAD
Atrial Rate: 85 {beats}/min
P Axis: 62 degrees
P-R Interval: 138 ms
Q-T Interval: 342 ms
QRS Duration: 86 ms
QTC Calculation (Bezet): 406 ms
R Axis: 48 degrees
T Axis: 46 degrees
Ventricular Rate: 85 {beats}/min

## 2022-03-29 LAB — BASIC METABOLIC PANEL
Anion Gap: 9 (ref 5.0–15.0)
BUN: 11 mg/dL (ref 9.0–28.0)
CO2: 27 mEq/L (ref 17–29)
Calcium: 8.4 mg/dL (ref 7.9–10.2)
Chloride: 99 mEq/L (ref 99–111)
Creatinine: 0.8 mg/dL (ref 0.5–1.5)
Glucose: 99 mg/dL (ref 70–100)
Potassium: 3.6 mEq/L (ref 3.5–5.3)
Sodium: 135 mEq/L (ref 135–145)
eGFR: >60 mL/min/{1.73_m2} (ref >=60)

## 2022-03-29 LAB — PT AND APTT
PT INR: 1.3 — ABNORMAL HIGH (ref 0.9–1.1)
PT: 15.4 s — ABNORMAL HIGH (ref 10.1–12.9)
PTT: 31 s (ref 27–39)

## 2022-03-29 LAB — CBC
Absolute NRBC: 0 10*3/uL (ref 0.00–0.00)
Hematocrit: 28.7 % — ABNORMAL LOW (ref 37.6–49.6)
Hgb: 9.1 g/dL — ABNORMAL LOW (ref 12.5–17.1)
MCH: 29 pg (ref 25.1–33.5)
MCHC: 31.7 g/dL (ref 31.5–35.8)
MCV: 91.4 fL (ref 78.0–96.0)
MPV: 9.2 fL (ref 8.9–12.5)
Nucleated RBC: 0 /100 WBC (ref 0.0–0.0)
Platelets: 302 10*3/uL (ref 142–346)
RBC: 3.14 10*6/uL — ABNORMAL LOW (ref 4.20–5.90)
RDW: 15 % (ref 11–15)
WBC: 7.11 10*3/uL (ref 3.10–9.50)

## 2022-03-29 LAB — FOLATE: Folate: 11.5 ng/mL

## 2022-03-29 LAB — IRON PROFILE
Iron Saturation: 13 % — ABNORMAL LOW (ref 15–50)
Iron: 26 ug/dL — ABNORMAL LOW (ref 40–160)
TIBC: 201 ug/dL — ABNORMAL LOW (ref 261–462)
UIBC: 175 ug/dL (ref 126–382)

## 2022-03-29 LAB — HAPTOGLOBIN: Haptoglobin: 323 mg/dL — ABNORMAL HIGH (ref 14–258)

## 2022-03-29 LAB — VITAMIN B12: Vitamin B-12: 553 pg/mL (ref 211–911)

## 2022-03-29 LAB — HEMOLYSIS INDEX: Hemolysis Index: 3 Index (ref 0–24)

## 2022-03-29 LAB — LACTATE DEHYDROGENASE: LDH: 243 U/L (ref 120–331)

## 2022-03-29 LAB — FERRITIN: Ferritin: 129.3 ng/mL (ref 21.80–274.70)

## 2022-03-29 NOTE — Plan of Care (Signed)
Problem: Safety  Goal: Patient will be free from injury during hospitalization  Outcome: Progressing  Flowsheets (Taken 03/29/2022 0341)  Patient will be free from injury during hospitalization:   Assess patient's risk for falls and implement fall prevention plan of care per policy   Provide and maintain safe environment   Use appropriate transfer methods   Ensure appropriate safety devices are available at the bedside   Include patient/ family/ care giver in decisions related to safety   Hourly rounding   Assess for patients risk for elopement and implement Elopement Risk Plan per policy   Provide alternative method of communication if needed (communication boards, writing)   Pt is A&Ox4, drowsy but easily arousable. VSS. Pt satting 88% on RA, so put on 2L with sats >92%. Denies sob. Denies pain. No appetite. Ambulates to bathroom with 1 assist. Urinary frequency, -BM so far this shift. Mediport Rt chest not accessed. Awaiting oncology consult.

## 2022-03-29 NOTE — Progress Notes (Signed)
Initial Case Management Assessment and Discharge Planning  [facility]   Patient Name: [name] [name], [name] [name]   Date of Birth 09/20/1950   Attending Physician: Arneta Cliche, MD   Primary Care Physician: Pcp, None, MD   Length of Stay 0   Reason for Consult / Chief Complaint Pleural effusion        Situation   Admission DX:   1. Pleural effusion    2. Chronic anemia    3. Pyuria    4. Hypoxia        A/O Status: X 3    Patient admitted from: ER  Admission Status: observation    Health Care Agent: Self  Name: same as patient  Phone number: same as patient       Background     Advanced directive:   <no information>    Code Status:   NO CPR - SUPPORT OK   Residence: Multi-story home    PCP: PCP None, MD  Patient Contact:   [phone] (home)     [phone] (mobile)     Emergency contact:   Extended Emergency Contact Information  Primary Emergency Contact: Lancon,Barbara  Address: [address]           [place], [place] [address] [place]  Mobile Phone: [phone]  Relation: Spouse  Preferred language: English  Interpreter needed? No  Secondary Emergency Contact: [name] [name]  Mobile Phone: [phone]  Relation: Son  Preferred language: English      ADL/IADL's: Independent  Previous Level of function: 7 Independent     DME: None    Pharmacy:     [id number], [place] - [address]  [address]  [place] [address]  Phone: [phone] Fax: [phone]      Prescription Coverage: Yes    Home Health: The patient is not currently receiving home health services.    Previous SNF/AR: none    COVID Vaccine Status: 5 doses, last dose Oct 2022    Date First IMM given: n/a--OBS  UAI on file?: No  Transport for discharge? Mode of transportation: Sales executive - Family/Friend to drive patient  Agreeable to Home with family post-discharge:  Yes     Assessment   Assessment completed over the phone with patient. He lives with wife and son. He is functionally independent  and able to drive. He is insured with Rx coverage. He is in the preliminary stages of workup for entering a clinical trial with [facility] and hopes to discharge in time for 7/26 appointment.    He expects to return home when discharged and has no current concerns about discharge needs.    BARRIERS TO DISCHARGE: urine cx pending     Recommendation   D/C Plan A: Home with family    D/C Plan B: Home with family    D/C Plan C: Home with family       Conception Chancy, Newton Pigg, ACM-SW  [facility]  Social Worker II Case Manager (PRN)  [phone] or [phone]

## 2022-03-29 NOTE — OT Progress Note (Signed)
Occupational Therapy Note    OT received orders to see patient and chart reviewed. Patient is at baseline for functional mobility and ADL routine. Declines need for OT services at this time. Will d/c orders.     [place], [place], OTR/L

## 2022-03-29 NOTE — UM Notes (Signed)
This clinical review is based on/compiled from documentation provided by the treatment team within the patient's medical record.    Raymond Gurney, RN, BSN  Clinical Case Manager  [facility]   [address]  [address]  [place], [place] [address]  NPI: [id number]  Tax ID: [id number]  Phone: [phone]  Fax: [phone]          PATIENT NAME: [name] [name],[name] [name]  DOB: 1950-09-17  PMH:   Past Medical History:   Diagnosis    Arthritis    Hyperlipidemia    Lung anomaly    Malignant neoplasm     ADMITTED ON: 03/28/2022 The patient [name] [name], is a 71 y.o. male who presents with history of hyperlipidemia mesothelioma on immunotherapy with last dose 2 weeks ago presents with generalized weakness for the past 4 days.  He feels nauseous but no vomiting.  No worsening shortness of breath.  No blood or black in stool.  He notes some chills but no fever noted.  No other complaints.      ED Triage Vitals   Enc Vitals Group      BP 03/28/22 1054 125/71      Heart Rate 03/28/22 1054 82      Resp Rate 03/28/22 1203 21      Temp 03/28/22 1054 98.4 F (36.9 C)      Temp Source 03/28/22 1054 Oral      SpO2 03/28/22 1054 93 %      Weight 03/28/22 1054 98.9 kg (218 lb 0.6 oz)      Height 03/28/22 1054 1.727 m (5\' 8" )       ADMISSION DIAGNOSIS:     03/28/22 1500  Adult Admit to Observation  Once        Diagnosis: Pleural Effusion   Level of Care: Acute   Patient Class: Observation    References:    [facility] Bed Placement Criteria    [facility] Bed Placement Criteria    [facility] Bed Placement Criteria    [facility] Bed Placement Criteria    [facility] Bed Placement Criteria   Question Answer Comment   Admitting Physician [facility]    Service: Medicine    Estimated Length of Stay < 2 midnights    Tentative Discharge Plan? Home or Self Care    Does patient need telemetry? No              03/28/2022  NOTES:     [name] [name] is a 71 y.o. male admitted under OBSERVATION with:     1. Progressive SOB likely 2/2 Worsening Epithelioid    CXR noted  loculated right pleural effusion, however minimal amount of loculated fluid noted on CT Chest, symptoms likely attributed to worsening pleural changes due to malignancy  Currently receiving Immunotherapy (Ipilimumab and Nivolumab), no recent regimen changes, [facility] consulted  Well's Score 1- low risk for PE, SOB is progressive over course of many months and not acute  Likely no role for thoracentesis, can confirm with IR in AM  Patient received dose of Lasix in ED, reports no symptomatic improvement  Monitor respiratory status, continue supportive care  Follows with [facility] as outpatient     2. UTI  3. Generalized Weakness possibly 2/2 above   No evidence of sepsis present on admission  Patient complaining of urinary frequency, although   Abnormal UA with Tr LE, TNTC WBC  Continue CTX, urine culture pending  PT/OT     4. HLD  Statin  5. BPH  Hytrin    LABS:    03/28/22 13:32   Color, UA Amber !   Clarity, UA Cloudy !   Specific Gravity, UA 1.021   Urine pH 5.0   Leukocyte Esterase, UA Trace !   Nitrite, UA Negative   Protein, UR 100 !   Glucose, UA Negative   Ketones UA Trace !   Urobilinogen, UA Normal   Bilirubin, UA Negative   Blood, UA Small !   RBC UA 11 - 25 !   WBC, UA TNTC !       MEDS:   Rocephin 1g IV  Lasix 40mg  IV  NaCl 1,01400ml IV bolus    IMAGING:  CT Chest without Contrast    Result Date: 03/28/2022   Pleural thickening in the right hemithorax is slightly increased from previous exam in January, and atelectasis of the right lung due to pleural disease is also slightly increased. [name] BirchPaul Cunningham, MD 03/28/2022 3:22 PM    Chest AP Portable    Result Date: 03/28/2022  Worsening appearance of the right hemithorax since 09/26/2021. Al DecantMichael Varanelli, MD 03/28/2022 11:49 AM           03/29/22- Patient remains on medical unit, vitals every 4 hours    Vitals:    03/29/22 0325 03/29/22 0746 03/29/22 0750 03/29/22 1228   BP: 104/68 (!) 72/40 121/65 106/79   Pulse: 83 78  90   Resp: 20 18  20     Temp: 98.1 F (36.7 C) 98.8 F (37.1 C)  99.3 F (37.4 C)   TempSrc: Oral Oral  Oral   SpO2: (!) 89% 90% 96% 92%   Weight:       Height:

## 2022-03-29 NOTE — Plan of Care (Signed)
Patient alert and oriented x 4, independent with hygiene.  VSS on 2L oxygen via nasal cannula.  Continues with frequent urination.  Low appetite.  Nutritional consult added.  Order for Ensure obtained.        Problem: Moderate/High Fall Risk Score >5  Goal: Patient will remain free of falls  Outcome: Progressing  Flowsheets (Taken 03/28/2022 2100 by Joellyn Quails, RN)  Moderate Risk (6-13):   MOD-Apply bed exit alarm if patient is confused   MOD-Remain with patient during toileting   MOD-Place bedside commode and assistive devices out of sight when not in use   MOD-Perform dangle, stand, walk (DSW) prior to mobilization   MOD-Use gait belt when appropriate     Problem: Safety  Goal: Patient will be free from infection during hospitalization  Outcome: Progressing  Flowsheets (Taken 03/29/2022 1615)  Free from Infection during hospitalization:   Assess and monitor for signs and symptoms of infection   Monitor lab/diagnostic results     Problem: Nutrition  Goal: Nutritional intake is adequate  Outcome: Progressing  Flowsheets (Taken 03/29/2022 1612)  Nutritional intake is adequate:   Allow adequate time for meals   Encourage/administer dietary supplements as ordered (i.e. tube feed, TPN, oral, OGT/NGT, supplements)   Consult/collaborate with Clinical Nutritionist   Assess anorexia, appetite, and amount of meal/food tolerated     Problem: Bladder/Voiding  Goal: Free from infection  Outcome: Progressing  Flowsheets (Taken 03/29/2022 1615)  Free from infection: Monitor/assess for signs and symptoms of infection

## 2022-03-29 NOTE — Consults (Signed)
Inpatient Heme/Onc NP/PA spectra x [phone]  M-F 8am-430pm  After hours, call [phone]  Secure Chat our team: [facility] Hematology/Oncology      INPATIENT ONCOLOGY/HEMATOLOGY CONSULTATION    Date Time: 03/29/22 2:53 PM  Patient Name: [name] [name],[name] [name]  Requesting Physician: Arneta Cliche, MD  Primary Oncologist: Dr. Fernanda Drum  Attending Oncologist: Dr. 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 Due to have an appointment [facility] on 7/[facility]  - Progressive SOB currently on 2L of oxygen  - Normocytic anemia   - Afebrile  - Generalized weakness    CT Chest WO Contrast 03/28/22: Pleural thickening in the right hemithorax is slightly  increased from previous exam in January, and atelectasis of the right lung due to pleural disease is also slightly increased.    Plan:   - Trend CBC, CMP  - Continue supportive care.  - No evidence of lung infection or pneumonitis noted on scan.  - Keep follow up follow up with Dr. Fernanda Drum on 7/31 and  [facility] on 7[facility].  - Additional medial management as per primary team.     Case reviewed and discussed with patient and Dr. Freida Busman.     Attending Note:  Patient seen/examined.  I performed the substantive portion of this visit by personally conducting the medical decision making in its entirety.  I have reviewed and updated the documented findings and plan of care accordingly.  Patient was also seen by Baxter Hire Smiley-Flowers, NP    Assessment:  Epithelioid mesothelioma, treated with ipi/nivo  SOB, hypoxia on supplemental O2 2/2 #1   Anemia likely 2/2 medical illness  UTI    Plan:  - Comfort & supportive measures.  - Anemia work-up including haptoglobin and LDH.  - PT/OT.  - Outpatient Med Onc f/u.  - Patient scheduled to be [facility] later this week.    Pam Drown, M.D.  Hematology & Medical Oncology    Health System    History:   [name] [name] is a 71 y.o. male who presents to the hospital on 03/28/2022 with worsening shortness of breath and generalized weakness. 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 Due to have an appointment with [facility] on 7/26. Today he reports that he feels a littler better since being admitted. He reports that he continues to have some shortness of breath is on 2L of oxygen. He reports fatigue and being sleeping at this time. He is afebrile, does not report chest pain, nausea, vomiting or any evidence of bleeding.     Past Medical History:     Past Medical History:   Diagnosis Date    Arthritis     Hyperlipidemia     Lung anomaly     Malignant neoplasm        Past Surgical History:     Past Surgical History:   Procedure Laterality Date    CHOLECYSTECTOMY         Family History:     Family History   Problem Relation Age of Onset    Dementia Mother     Angina Father     Heart attack Father        Social History:     Social History     Socioeconomic History  Marital status: Married     Spouse name: Not on file    Number of children: Not on file    Years of education: Not on file    Highest education level: Not on file   Occupational History    Not on file   Tobacco Use    Smoking status: Former     Packs/day: 1.50     Years: 20.00     Total pack years: 30.00     Types: Cigarettes, Cigars     Quit date: 09/06/1988     Years since quitting: 33.5    Smokeless tobacco: Never   Vaping Use    Vaping Use: Never used   Substance and Sexual Activity    Alcohol use: Yes     Alcohol/week: 3.0 standard drinks of alcohol     Types: 3 Shots of liquor per week    Drug use: Yes     Frequency: 4.0 times per week     Types: Marijuana    Sexual activity: Yes     Partners: Female     Comment: vasectomy   Other Topics Concern    Not on file   Social History Narrative    Not on file     Social Determinants of Health      Financial Resource Strain: Low Risk  (01/24/2022)    Overall Financial Resource Strain (CARDIA)     Difficulty of Paying Living Expenses: Not hard at all   Food Insecurity: No Food Insecurity (01/24/2022)    Hunger Vital Sign     Worried About Running Out of Food in the Last Year: Never true     Ran Out of Food in the Last Year: Never true   Transportation Needs: No Transportation Needs (01/24/2022)    PRAPARE - Therapist, art (Medical): No     Lack of Transportation (Non-Medical): No   Physical Activity: Inactive (01/24/2022)    Exercise Vital Sign     Days of Exercise per Week: 0 days     Minutes of Exercise per Session: 0 min   Stress: Stress Concern Present (01/24/2022)    [employer] of Occupational Health - Occupational Stress Questionnaire     Feeling of Stress : To some extent   Social Connections: Moderately Isolated (01/24/2022)    Social Connection and Isolation Panel [NHANES]     Frequency of Communication with Friends and Family: More than three times a week     Frequency of Social Gatherings with Friends and Family: More than three times a week     Attends Religious Services: Never     Database administrator or Organizations: No     Attends Banker Meetings: Never     Marital Status: Married   Catering manager Violence: Not At Risk (01/24/2022)    Humiliation, Afraid, Rape, and Kick questionnaire     Fear of Current or Ex-Partner: No     Emotionally Abused: No     Physically Abused: No     Sexually Abused: No   Housing Stability: High Risk (01/24/2022)    Housing Stability Vital Sign     Unable to Pay for Housing in the Last Year: No     Number of Places Lived in the Last Year: 3     Unstable Housing in the Last Year: No       Allergies:   No Known Allergies    Medications:  Current Facility-Administered Medications   Medication Dose Route Frequency    aspirin EC  81 mg Oral Daily    atorvastatin  80 mg Oral Daily    cefTRIAXone  1 g Intravenous Q24H     heparin (porcine)  5,000 Units Subcutaneous Q12H [facility]    terazosin  10 mg Oral Daily       Review of Systems:       10 pt ROS negative except for as HPI. All other systems were reviewed.    Physical Exam:     Vitals:    03/29/22 1228   BP: 106/79   Pulse: 90   Resp: 20   Temp: 99.3 F (37.4 C)   SpO2: 92%       Intake and Output Summary (Last 24 hours) at Date Time  No intake or output data in the 24 hours ending 03/29/22 1453    General appearance - alert, well appearing, and in no distress  Chest - symmetric air entry  Heart - normal rate and regular rhythm  Abdomen - soft, nontender, nondistended, no masses or organomegaly    Labs Reviewed:     Results       Procedure Component Value Units Date/Time    Lactate dehydrogenase [[id number]] Collected: 03/29/22 1352    Specimen: Blood Updated: 03/29/22 1422     LDH 243 U/L     Haptoglobin [[id number]] Collected: 03/29/22 1352    Specimen: Blood Updated: 03/29/22 1356    Vitamin B12 [[id number]] Collected: 03/29/22 0552    Specimen: Blood Updated: 03/29/22 1124     Vitamin B-12 553 pg/mL     Folate [[id number]] Collected: 03/29/22 0552    Specimen: Blood Updated: 03/29/22 1124     Folate 11.5 ng/mL     Ferritin [[id number]] Collected: 03/29/22 0552    Specimen: Blood Updated: 03/29/22 1124     Ferritin 129.30 ng/mL     IRON PROFILE [[id number]]  (Abnormal) Collected: 03/29/22 0552     Updated: 03/29/22 1124     Iron 26 ug/dL      UIBC 536 ug/dL      TIBC 644 ug/dL      Iron Saturation 13 %     Hemolysis index [[id number]] Collected: 03/29/22 0552     Updated: 03/29/22 1124     Hemolysis Index 3 Index     Basic Metabolic Panel [[id number]] Collected: 03/29/22 0552    Specimen: Blood Updated: 03/29/22 0647     Glucose 99 mg/dL      BUN 29.5 mg/dL      Creatinine 0.8 mg/dL      Calcium 8.4 mg/dL      Sodium 188 mEq/L      Potassium 3.6 mEq/L      Chloride 99 mEq/L      CO2 27 mEq/L      Anion Gap 9.0     eGFR >60.0 mL/min/1.73 m2     Magnesium [[id number]] Collected: 03/29/22 0552     Specimen: Blood Updated: 03/29/22 0647     Magnesium 1.6 mg/dL     PT/APTT [[id number]]  (Abnormal) Collected: 03/29/22 0552     Updated: 03/29/22 0640     PT 15.4 sec      PT INR 1.3     PTT 31 sec     CBC without differential [[id number]]  (Abnormal) Collected: 03/29/22 0552    Specimen: Blood Updated: 03/29/22 0614     WBC 7.11 x10 3/uL      Hgb 9.1  g/dL      Hematocrit 62.9 %      Platelets 302 x10 3/uL      RBC 3.14 x10 6/uL      MCV 91.4 fL      MCH 29.0 pg      MCHC 31.7 g/dL      RDW 15 %      MPV 9.2 fL      Nucleated RBC 0.0 /100 WBC      Absolute NRBC 0.00 x10 3/uL     Body Fluid Cell Count [[id number]] Resulted: 03/28/22 1526    Specimen: Body Fluid Updated: 03/28/22 1526     Body Fluid Source: Pleural Fluid    Narrative:      To be collected in IR              Rads:   Radiological Procedure reviewed.     Signed by: Stephanie Acre Smiley-Flowers, NP, MSN, OCN, FNP-BC        [facility]  [address]  [place] [address]  [phone]

## 2022-03-29 NOTE — PT Eval Note (Signed)
Physical Therapy Evaluation    [name] [name]    Unit: 4[facility] MED[facility]  Bed: U981/X914-78      Post Acute Care Therapy Recommendations:   Discharge Recommendations:  Home with no needs  DME needs IF patient is discharging home: No additional equipment/DME recommended at this time    Assessment:   Patient is independent with gait on level surface and stairs w/o need for devices.. No continued inpatient physical therapy needs.   Discharge therapy.    PULSE OXIMETRY TESTING:   _96___% on oxygen at rest, at ___2____LPM via NC  _95____% on room air, at rest  _91____% on room air, with exertion   _93____% on oxygen, with exertion, at__2___LPM via NC      Examination - WFL lower extemity range of motion and muscle strength throughout.      Co-morbidities/Patient factors affecting plan of care - stairs in home    Clinical factors affecting plan of care - monitor SpO2, abnormal labs         PMP - Progressive Mobility Protocol   PMP Activity: Step 7 - Walks out of Room  Distance Walked (ft) (Step 6,7): 300 Feet        Interdisciplinary Communication:   Patient is in bed with alarm activated; call bell within reach.  Updated white communication board in room with patient's current mobility status. Spoke with RN regarding results of evaluation.    Plan:   Goals:  All physical therapy goals met on evaluation.  Discontinue PT.        Education:   Educated patient on role of physical therapy and no further needs for inpatient PT.  Patient verbalized understanding and in agreement with discharge.    Evaluation:   Consult received for [name] [name] for PT evaluation and treatment.  Chart reviewed.  Patient's medical condition is appropriate for Physical Therapy intervention at this time.     Medical Diagnosis: Pleural effusion [J90]  Hypoxia [R09.02]  Pyuria [R82.81]  Chronic anemia [D64.9]    Rehab diagnosis: Generalized weakness        History of Present Illness: [name] [name] is a 71 y.o. male admitted on 03/28/2022 with  Generalized  Weakness, Progressive SOB.  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 -- currently on 2 LPM, at baseline on room air  Well's Score 1- low risk for PE        Patient Active Problem List   Diagnosis    Mesothelioma    Encounter for screening for other suspected endocrine disorder    Encounter for long-term (current) use of medications    Benign prostatic hyperplasia with weak urinary stream    Gross hematuria    Pleural effusion     Past Medical History:   Diagnosis Date    Arthritis     Hyperlipidemia     Lung anomaly     Malignant neoplasm      Past Surgical History:   Procedure Laterality Date    CHOLECYSTECTOMY                 X-Rays/Tests/Labs:    Lab Results   Component Value Date    WBC 7.11 03/29/2022    HGB 9.1 (L) 03/29/2022    HCT 28.7 (L) 03/29/2022    MCV 91.4 03/29/2022    PLT 302 03/29/2022       CT Chest without Contrast    Result Date: 03/28/2022   Pleural thickening in the right hemithorax is slightly increased from previous  exam in January, and atelectasis of the right lung due to pleural disease is also slightly increased. [name] Birch, MD 03/28/2022 3:22 PM    Chest AP Portable    Result Date: 03/28/2022  Worsening appearance of the right hemithorax since 09/26/2021. [name] Decant, MD 03/28/2022 11:49 AM    CT Abdomen Pelvis W WO IV/ WO PO Cont    Result Date: 03/21/2022  1. Small nonobstructive right renal calculus. 2. The prostate is enlarged, and the bladder is distended and has a trabeculated wall likely due to chronic bladder outlet obstruction. 3. Right pleural masses and trace right pleural effusion are consistent with reported clinical history of mesothelioma. 4. Additional chronic findings are detailed above. [name] Birch, MD 03/21/2022 10:55 PM           Prior Level of Function   Prior level of function: Ambulates / Performs ADL's independently   Baseline Activity Level: Community ambulation   DME Currently at Home: none      Home Living Arrangements   Pt lives  with sife and son in a multi story home. Ambulates without devices.     Subjective: Nursing clears patient for therapy.         Pain Assessment  0/10     Objective:  Observation of patient/vitals    Blood pressure 121/65, pulse 78, temperature 98.8 F (37.1 C), temperature source Oral, resp. rate 18, height 1.727 m (5\' 8" ), weight 98.9 kg (218 lb 0.6 oz), SpO2 96 %.      Cognition: intact        Inspection/Posture: telemetry, IV meds      Musculoskeletal Examination  Gross ROM: Within functional limits B LE  Gross Strength: Within functional limits B LE            Functional Mobility  Transfers: independent  Ambulation: 300 ft w/o device independently w/o any balance losses  Stairs: 4 steps with 1 rail independently      Balance: within functional limits                  Treatment:    PT eval and safety education with pt          MD cosign needed : Y      Time Calculation  PT Received On: 03/29/22  Start Time: 0830  Stop Time: 0900  Time Calculation (min): 30 min          Signature: Marquinn Meschke, PT  03/29/2022     Unit: 4[facility] MED[facility]  Bed: Z610/R604-54

## 2022-03-29 NOTE — Progress Notes (Signed)
[facility]  HOSPITALIST  PROGRESS NOTE      Patient: [name] [name]  Date: 03/29/2022   LOS: 0 Days  Admission Date: 03/28/2022   MRN: [id number]  Attending: Trinda Pascal MD     ASSESSMENT/PLAN     [name] [name] is a 71 y.o. male PMHx of Mesothelioma (on Immunotherapy), HLD, and BPH who presented with Generalized Weakness, Progressive SOB.    1. 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   On RA, no respiratory distress  CXR noted loculated right pleural effusion, however minimal amount of loculated fluid noted on CT Chest, symptoms likely attributed to worsening pleural changes due to malignancy  Currently receiving Immunotherapy (Ipilimumab and Nivolumab), no recent regimen changes, [facility] consulted. Patient notes he [facility] on [facility] and is hoping to make it  Well's Score 1- low risk for PE, SOB is progressive over course of many months and not acute  Likely no role for thoracentesis, patient has appointment with [facility] as noted above  Patient received dose of Lasix in ED, reports no symptomatic improvement  Monitor respiratory status, continue supportive care  Follows with [facility] as outpatient     2. UTI  3. Generalized Weakness possibly 2/2 above   No evidence of sepsis present on admission  Patient complaining of urinary frequency  Abnormal UA with Tr LE, TNTC WBC  Continue CTX, urine culture pending  PT/OT     4. HLD  Statin     5. BPH  Hytrin     FEN:  Fluid:  Electrolytes: Replete prn  Nutrition: Regular  .  Patient has BMI=Body mass index is 33.15 kg/m.  Diagnosis: Obesity based on BMI criteria        Body mass index is 33.15 kg/m.      Monitoring:  Continuous pulse oximetry: No  Telemetry needed:  Level of care:      CODE STATUS: full code  Surrogate decision maker or Advanced Care Plan:      Dispo. Pending Ucx, will go [facility] to f/u on his mesothelioma            Care Plan discussed with nursing, consultants, case manager.         SUBJECTIVE     He is feeling  ok, just week. Doing well on NC    MEDICATIONS     Current Facility-Administered Medications   Medication Dose Route Frequency    aspirin EC  81 mg Oral Daily    atorvastatin  80 mg Oral Daily    cefTRIAXone  1 g Intravenous Q24H    heparin (porcine)  5,000 Units Subcutaneous Q12H [facility]    terazosin  10 mg Oral Daily       ROS     Remainder of 10 point ROS as above or otherwise negative    PHYSICAL EXAM     Vitals:    03/29/22 0750   BP: 121/65   Pulse:    Resp:    Temp:    SpO2: 96%       Temperature: Temp  Min: 97.7 F (36.5 C)  Max: 99 F (37.2 C)  Pulse: Pulse  Min: 78  Max: 88  Respiratory: Resp  Min: 18  Max: 24  Non-Invasive BP: BP  Min: 72/40  Max: 129/60  Pulse Oximetry SpO2  Min: 89 %  Max: 96 %    Intake and Output Summary (Last 24 hours) at Date Time  No intake or output data  in the 24 hours ending 03/29/22 0830    GEN APPEARANCE: chronically ill-appearing , NAD, A&OX3, NC in place  HEENT: PERLA; EOMI; Conjunctiva Clear  NECK: Supple; No bruits  CVS: RRR, S1, S2; No M/G/R  LUNGS: basilar coarseness and rales noted  ABD: Soft; No TTP; + Normoactive BS  EXT: No edema; Pulses 2+ and intact  SKIN: No rash or Lesions  NEURO: CN 2-12 intact; No Focal neurological deficits      LABS     Recent Labs   Lab 03/29/22  0552 03/28/22  1111   WBC 7.11 8.70   RBC 3.14* 3.38*   Hgb 9.1* 9.9*   Hematocrit 28.7* 30.7*   MCV 91.4 90.8   Platelets 302 316       Recent Labs   Lab 03/29/22  0552 03/28/22  1111   Sodium 135 135   Potassium 3.6 4.1   Chloride 99 99   CO2 27 26   BUN 11.0 11.0   Creatinine 0.8 0.9   Glucose 99 112*   Calcium 8.4 9.1   Magnesium 1.6  --        Recent Labs   Lab 03/28/22  1111   ALT 11   AST (SGOT) 12   Bilirubin, Total 1.1   Albumin 3.0*   Alkaline Phosphatase 83       Recent Labs   Lab 03/28/22  1111   hs Troponin-I 5.6       Recent Labs   Lab 03/29/22  0552   PT INR 1.3*   PT 15.4*   PTT 31       Microbiology Results (last 15 days)       Procedure Component Value Units Date/Time    Culture,  Anaerobic Bacteria [[id number]][[id number]]     Order Status: Canceled Specimen: Body Fluid from Pleural Fluid     Culture + Gram Stain,Aerobic, Body Fluid [[id number]][[id number]]     Order Status: Canceled Specimen: Body Fluid from Pleural Fluid     Fungal Culture & Smear [[id number]][[id number]]     Order Status: Canceled Specimen: Body Fluid from Pleural Fluid     Urine culture [[id number]][[id number]] Collected: 03/28/22 1332    Order Status: No result Specimen: Urine Updated: 03/28/22 1355    COVID-19 (SARS-CoV-2) and Influenza A/B, NAA (Liat Rapid)- Admission [[id number]][[id number]] Collected: 03/28/22 1111    Order Status: Completed Specimen: Culturette from Nasopharyngeal Updated: 03/28/22 1140     Purpose of COVID testing Diagnostic -[facility]I     SARS-CoV-2 Specimen Source Nasal Swab     SARS CoV 2 Overall Result Not Detected     Comment: __________________________________________________  -A result of "Detected" indicates POSITIVE for the    presence of SARS CoV-2 RNA  -A result of "Not Detected" indicates NEGATIVE for the    presence of SARS CoV-2 RNA  __________________________________________________________  Test performed using the Roche cobas Liat SARS-CoV-2 assay. This assay is  only for use under the Food and Drug Administration's Emergency Use  Authorization. This is a real-time RT-PCR assay for the qualitative  detection of SARS-CoV-2 RNA. Viral nucleic acids may persist in vivo,  independent of viability. Detection of viral nucleic acid does not imply the  presence of infectious virus, or that virus nucleic acid is the cause of  clinical symptoms. Negative results do not preclude SARS-CoV-2 infection and  should not be used as the sole basis for diagnosis, treatment or other  patient management decisions. Negative results must be combined with  clinical observations,  patient history, and/or epidemiological information.  Invalid results may be due to inhibiting substances in the specimen and  recollection should occur. Please see Fact Sheets for patients and  providers  located:  WirelessDSLBlog.no          Influenza A Not Detected     Influenza B Not Detected     Comment: Test performed using the Roche cobas Liat SARS-CoV-2 & Influenza A/B assay.  This assay is only for use under the Food and Drug Administration's  Emergency Use Authorization. This is a multiplex real-time RT-PCR assay  intended for the simultaneous in vitro qualitative detection and  differentiation of SARS-CoV-2, influenza A, and influenza B virus RNA. Viral  nucleic acids may persist in vivo, independent of viability. Detection of  viral nucleic acid does not imply the presence of infectious virus, or that  virus nucleic acid is the cause of clinical symptoms. Negative results do  not preclude SARS-CoV-2, influenza A, and/or influenza B infection and  should not be used as the sole basis for diagnosis, treatment or other  patient management decisions. Negative results must be combined with  clinical observations, patient history, and/or epidemiological information.  Invalid results may be due to inhibiting substances in the specimen and  recollection should occur. Please see Fact Sheets for patients and providers  located: http://www.rice.biz/.         Narrative:      o Collect and clearly label specimen type:  o PREFERRED-Upper respiratory specimen: One Nasal Swab in  Transport Media.  o Hand deliver to laboratory ASAP  Diagnostic -[facility]I             RADIOLOGY     Radiological Procedure personally reviewed and concur with radiologist reports unless stated otherwise.    CT Chest without Contrast   Final Result    Pleural thickening in the right hemithorax is slightly   increased from previous exam in January, and atelectasis of the right lung   due to pleural disease is also slightly increased.      [name], MD   03/28/2022 3:22 PM      Chest AP Portable   Final Result      Worsening appearance of the right hemithorax since 09/26/2021.      Al Decant, MD   03/28/2022 11:49 AM          Signed,  Trinda Pascal, MD  8:30 AM 03/29/2022

## 2022-03-30 ENCOUNTER — Observation Stay: Payer: Medicare Other

## 2022-03-30 LAB — CBC
Absolute NRBC: 0 10*3/uL (ref 0.00–0.00)
Hematocrit: 29.6 % — ABNORMAL LOW (ref 37.6–49.6)
Hgb: 9.2 g/dL — ABNORMAL LOW (ref 12.5–17.1)
MCH: 28.8 pg (ref 25.1–33.5)
MCHC: 31.1 g/dL — ABNORMAL LOW (ref 31.5–35.8)
MCV: 92.8 fL (ref 78.0–96.0)
MPV: 9.4 fL (ref 8.9–12.5)
Nucleated RBC: 0 /100 WBC (ref 0.0–0.0)
Platelets: 327 10*3/uL (ref 142–346)
RBC: 3.19 10*6/uL — ABNORMAL LOW (ref 4.20–5.90)
RDW: 15 % (ref 11–15)
WBC: 6.84 10*3/uL (ref 3.10–9.50)

## 2022-03-30 LAB — MAGNESIUM: Magnesium: 1.7 mg/dL (ref 1.6–2.6)

## 2022-03-30 LAB — BASIC METABOLIC PANEL
Anion Gap: 10 (ref 5.0–15.0)
BUN: 10 mg/dL (ref 9.0–28.0)
CO2: 28 mEq/L (ref 17–29)
Calcium: 8.7 mg/dL (ref 7.9–10.2)
Chloride: 98 mEq/L — ABNORMAL LOW (ref 99–111)
Creatinine: 0.8 mg/dL (ref 0.5–1.5)
Glucose: 101 mg/dL — ABNORMAL HIGH (ref 70–100)
Potassium: 3.9 mEq/L (ref 3.5–5.3)
Sodium: 136 mEq/L (ref 135–145)
eGFR: >60 mL/min/{1.73_m2} (ref >=60)

## 2022-03-30 MED ORDER — DRONABINOL 2.5 MG PO CAPS
2.5000 mg | ORAL_CAPSULE | Freq: Two times a day (BID) | ORAL | Status: DC
Start: 2022-03-30 — End: 2022-03-30

## 2022-03-30 MED ORDER — DRONABINOL 2.5 MG PO CAPS
2.5000 mg | ORAL_CAPSULE | Freq: Two times a day (BID) | ORAL | Status: DC
Start: 2022-03-30 — End: 2022-03-30
  Administered 2022-03-30: 2.5 mg via ORAL
  Filled 2022-03-30: qty 1

## 2022-03-30 MED ORDER — DRONABINOL 2.5 MG PO CAPS
2.5000 mg | ORAL_CAPSULE | Freq: Two times a day (BID) | ORAL | 0 refills | Status: DC
Start: 2022-03-30 — End: 2022-04-01

## 2022-03-30 NOTE — Progress Notes (Signed)
Home Oxygen Order         [facility]  [facility]          Patient Name: [name] [name], [name] [name]     MRN: [id number]      CSN: [id number]         Account Information     Hosp Acct #   [id number] Patient Class   Inpatient Service  Medicine Accommodation Code  Semi-Private      Admission Information     Admitting Physician:  Attending Physician: Imogene Burn, MD  Imogene Burn, MD Unit  FO 4NEW MEDICAL L&D Status      Admitting Diagnosis: Pleural effusion; Hypoxia; Pyuria; Chronic anemia Room / Bed  W295/A213-08 L&D - Last Menstrual Cycle      Chief Complaint: Dehydration     Admit Type:  Admit Date/Time:  Discharge Date/Time: Emergency  03/28/2022 / 1059   /  Length of Stay: 0 Days    L&D EDD   Estimated Date of Delivery: None noted.      Patient Information              Home Address: [address]  [place] [address] Employer:  Employer Address:       ,     Main Phone: [phone] Employer Phone:     SSN: [id number]       DOB: [birth year] (71 yrs)       Sex: Male Primary Care Physician: Pcp, None, MD   Marital Status: Married Referring Physician:       No ref. provider found   Race: White or Caucasian       Ethnicity: Not of Hispanic/Latino/S*       Emergency Contacts  Name Home Phone Work Phone Mobile Phone Relationship CHEROKEE, CLOWERS     [phone] Spouse No   GERAN, HAITHCOCK     [phone] Son           Guarantor Information     Guarantor Name: MARCOANTONIO, LEGAULT ID: [id number]   Guarantor Relationship to Pt: Self Guarantor Type: Personal/Family   Guarantor DOB:    06/03/1951       Guarantor Address: [address]   [place], [place] [address]          Guarantor Home Phone: [phone] Guarantor Employer:        Guarantor Work Phone:   Pensions consultant Emp Phone:                     Chief Strategy Officer Name: [employer] Subscriber Name: [employer] Address:    [address]  [place] [address] Subscriber DOB: 03-12-1951       Subscriber ID: [id number]   Insurance Phone:   Pt Relationship to Sub:   Self   Insurance ID:         Group Name:   Preauthorization #: NPR   Group #:   Preauthorization Days:        Secondary Insurance     Insurance Name: [employer] OF [place] Subscriber Name: [facility]   Insurance Address:    [address] [place] [address] Subscriber DOB: Feb 05, 1951      Subscriber ID: [id number]   Insurance Phone: [phone] Pt Relationship to Sub:   Self   Insurance ID:         Group Name:   Preauthorization #:  Group #:   Preauthorization Days:        [employer] Name: - Subscriber Name:     Nurse, children's:       ,   Statistician DOB:        Subscriber ID:     Press photographer:   Pt Relationship to Sub:       Insurance ID:         Group Name:   Preauthorization #:     Group #:   Preauthorization Days:           03/30/2022 12:35 PM          [facility]  [facility]          Patient Name: [name] [name], [name] [name]     MRN: [id number]      CSN: [id number]         Account Information     Hosp Acct #   [id number] Patient Class   Inpatient Service  Medicine Accommodation Code  Semi-Private      Admission Information     Admitting Physician:  Attending Physician: Imogene Burn, MD  Imogene Burn, MD Unit  FO 4NEW MEDICAL L&D Status      Admitting Diagnosis: Pleural effusion; Hypoxia; Pyuria; Chronic anemia Room / Bed  W295/A213-08 L&D - Last Menstrual Cycle      Chief Complaint: Dehydration     Admit Type:  Admit Date/Time:  Discharge Date/Time: Emergency  03/28/2022 / 1059   /  Length of Stay: 0 Days    L&D EDD   Estimated Date of Delivery: None noted.      Patient Information              Home Address: [address]  [place] [address] Employer:  Employer Address:       ,     Main Phone: [phone] Employer Phone:     SSN: [id number]       DOB: March 02, 1951 (71 yrs)       Sex: Male Primary Care Physician: Pcp, None, MD   Marital Status: Married Referring Physician:        No ref. provider found   Race: White or Caucasian       Ethnicity: Not of Hispanic/Latino/S*       Emergency Contacts  Name Home Phone Work Phone Mobile Phone Relationship GREGORIO, WORLEY     [phone] Spouse No   LAIN, TETTERTON     [phone] Son           Guarantor Information     Guarantor Name: HERSHAL, ERIKSSON ID: [id number]   Guarantor Relationship to Pt: Self Guarantor Type: Personal/Family   Guarantor DOB:    [birth year]       Guarantor Address: [address]   [place], [place] [address]          Guarantor Home Phone: [phone] Guarantor Employer:        Guarantor Work Phone:   Pensions consultant Emp Phone:                     Chief Strategy Officer Name: [employer] Subscriber Name: [employer] Address:    [address]  [place] [address] Subscriber DOB: 04/16/1951      Subscriber ID: [id number]   Insurance Phone:   Pt Relationship to Sub:   Self   Insurance ID:  Group Name:   Preauthorization #: NPR   Group #:   Preauthorization Days:        Secondary Insurance     Insurance Name: [employer] OF [place] Subscriber Name: [facility]   Insurance Address:    [address] [place], [place] [address] Subscriber DOB: 1951/02/15      Subscriber ID: [id number]   Insurance Phone: [phone] Pt Relationship to Sub:   Self   Insurance ID:         Group Name:   Preauthorization #:     Group #:   Preauthorization Days:        [employer] Name: - Subscriber Name:     Community education officer Address:       ,   Statistician DOB:        Subscriber ID:     Press photographer:   Pt Relationship to Sub:       Insurance ID:         Group Name:   Preauthorization #:     Group #:   Preauthorization Days:           03/30/2022 12:49 PM      Home Health face-to-face (FTF) Encounter (Order [id number])  Consult  Date: 03/30/2022 Department: 1SWF Medical Ordering/Authorizing: Imogene Burn, MD     Order Information    Order Date/Time  Release Date/Time Start Date/Time End Date/Time   03/30/22 12:31 PM None 03/30/22 12:31 PM 03/30/22 12:31 PM     Order Details    Frequency Duration Priority Order Class   Once 1  occurrence Routine Hospital Performed     Standing Order Information    Remaining Occurrences Interval Last Released     0/1 Once 03/30/2022              Provider Information    Ordering User Ordering Provider Authorizing Provider   Palting, Leonette Most, RN Rane, Megan Mans, MD Imogene Burn, MD   Attending Provider(s) Admitting Provider PCP   Lucille Passy, MD; Burnett Kanaris, DO; Arneta Cliche, MD; Shelby Mattocks, MD; Imogene Burn, MD Imogene Burn, MD Pcp, None, MD     Cosign Order Info    Action Created on Order Mode Entered by Responsible Provider Signed by Signed on   Ordering 03/30/22 1231 Per protocol: cosign required Palting, Leonette Most, RN Imogene Burn, MD Imogene Burn, MD 03/30/22 1247           Comments    Oxygen __2__liters/minute via nasal cannula with ambulation or activity 8-10 hours per day with portability of gas for >99 months NPI: [id number]       Primary Diagnosis:   Pleural effusion [J90]   Hypoxia [R09.02]   Pyuria [R82.81]   Chronic anemia [D64.9]                Home Health face-to-face (FTF) Encounter: Patient Communication     Not Released  Not seen         Order Questions    Question Answer Comment   Date I saw the patient face-to-face: 03/30/2022    Evidence this patient is homebound because: O.  N/A DME only    Medical conditions that necessitate Home Health care: M.  N/A DME only. No skilled services needed    Per clinical findings, following services are medically necessary: DME    DME Oxygen Home O2   Other (please specify) see comments  Process Instructions    Please select Home Care Services medically necessary.     Based on the above findings, I certify that this patient is confined to the home and needs intermittent skilled nursing care, physical therapry and / or speech therapy or  continues to need occupational therapy. The patient is under my care, and I have initiated the establishment of the plan of care. This patient will be followed by a physician who will periodically review the plan of care.      Collection Information            Consult Order Info    ID Description Priority Start Date Start Time   [id number] Home Health face-to-face (FTF) Encounter Routine 03/30/2022 12:31 PM   Provider Specialty Referred to   ______________________________________ _____________________________________                         Sebastian Ache Order Info    Action Created on Order Mode Entered by Responsible Provider Signed by Signed on   Ordering 03/30/22 1231 Per protocol: cosign required Palting, Leonette Most, RN Imogene Burn, MD Imogene Burn, MD 03/30/22 1247           Patient Information    Patient Name   Jearl Klinefelter Legal Sex   Male DOB   Mar 31, 1951       Reprint Order Requisition    Home Health face-to-face (FTF) Encounter (Order [id number]) on 03/30/22       Additional Information    Associated Reports External References   Priority and Order Details [employer]      Octaviano Batty, RN   Registered Nurse  Nursing  Nursing Progress Note     Signed  Date of Service:  03/30/2022  9:50 AM  Creation Time:  03/30/2022  9:50 AM     Signed                              PULSE OXIMETRY TESTING:   Document patient's oxygen at rest on room air:   93% on room air, at rest (No ranges please)   _____% on oxygen, at rest at_____LPM via NC   IF 88% OR BELOW on room air, STOP HERE,   IF NOT ambulate patient on room air with exertion and document below:   87% on room air, with exertion (must be 88% or below)   90% on oxygen, with exertion, at 2 LPM via NC   All three tests must be during the same session.   Please document in a PROGRESS NOTE.   Testing to qualify for home oxygen must be no earlier than 48 hours prior to discharge, or it will need to be repeated.   Please call the home health care liaison at [phone] or case manager  when completed.       Ambulated approximately 250 ft. In hallways w/ RN supervision.                  Arneta Cliche, MD  Physician  Medicine  Progress Notes     Signed  Date of Service:  03/29/2022  8:30 AM  Creation Time:  03/29/2022  8:30 AM     Signed        Expand All Collapse All  [facility]  HOSPITALIST  PROGRESS NOTE      Patient: Keniel Ralston  Date: 03/29/2022   LOS: 0 Days  Admission Date: 03/28/2022   MRN: [id number]  Attending: Trinda Pascal MD      ASSESSMENT/PLAN      Caiden Arteaga is a 71 y.o. male PMHx of Mesothelioma (on Immunotherapy), HLD, and BPH who presented with Generalized Weakness, Progressive SOB.     1. 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 Patient notes he has an appointment with [facility] on [facility]/26 and is hoping to make it  Well's Score 1- low risk for PE, SOB is progressive over course of many months and not acute  Likely no role for thoracentesis, patient has appointment with [facility] as noted above  Patient received dose of Lasix in ED, reports no symptomatic improvement  Monitor respiratory status, continue supportive care  Follows with  Palliative as outpatient     2. UTI  3. Generalized Weakness possibly 2/2 above   No evidence of sepsis present on admission  Patient complaining of urinary frequency  Abnormal UA with Tr LE, TNTC WBC  Continue CTX, urine culture pending  PT/OT     4. HLD  Statin     5. BPH  Hytrin      FEN:  Fluid:  Electrolytes: Replete prn  Nutrition: Regular  .  Patient has BMI=Body mass index is 33.15 kg/m.  Diagnosis: Obesity based on BMI criteria        Body mass index is 33.15 kg/m.      Monitoring:  Continuous pulse oximetry: No  Telemetry needed:  Level of care:      CODE STATUS: full code  Surrogate decision maker or Advanced Care Plan:      Dispo. Pending Ucx, will go to [facility] to f/u on his mesothelioma                 Care Plan discussed with nursing, consultants, case manager.            SUBJECTIVE      He is feeling ok, just week. Doing well on NC     MEDICATIONS             Current Facility-Administered Medications   Medication Dose Route Frequency    aspirin EC  81 mg Oral Daily    atorvastatin  80 mg Oral Daily    cefTRIAXone  1 g Intravenous Q24H    heparin (porcine)  5,000 Units Subcutaneous Q12H [facility]    terazosin  10 mg Oral Daily         ROS      Remainder of 10 point ROS as above or otherwise negative     PHYSICAL EXAM      Vitals       Vitals:     03/29/22 0750   BP: 121/65   Pulse:     Resp:     Temp:     SpO2: 96%            Temperature: Temp  Min: 97.7 F (36.5 C)  Max: 99 F (37.2 C)  Pulse: Pulse  Min: 78  Max: 88  Respiratory: Resp  Min: 18  Max:  24  Non-Invasive BP: BP  Min: 72/40  Max: 129/60  Pulse Oximetry SpO2  Min: 89 %  Max: 96 %     Intake and Output Summary (Last 24 hours) at Date Time  No intake or output data in the 24 hours ending 03/29/22 0830     GEN APPEARANCE: chronically ill-appearing , NAD, A&OX3, NC in place  HEENT: PERLA; EOMI; Conjunctiva Clear  NECK: Supple; No bruits  CVS: RRR, S1, S2; No M/G/R  LUNGS: basilar coarseness and rales noted  ABD: Soft; No TTP; + Normoactive BS  EXT: No edema; Pulses 2+ and intact  SKIN: No rash or Lesions  NEURO: CN 2-12 intact; No Focal neurological deficits        LABS           Recent Labs   Lab 03/29/22  0552 03/28/22  1111   WBC 7.11 8.70   RBC 3.14* 3.38*   Hgb 9.1* 9.9*   Hematocrit 28.7* 30.7*   MCV 91.4 90.8   Platelets  302 316              Recent Labs   Lab 03/29/22  0552 03/28/22  1111   Sodium 135 135   Potassium 3.6 4.1   Chloride 99 99   CO2 27 26   BUN 11.0 11.0   Creatinine 0.8 0.9   Glucose 99 112*   Calcium 8.4 9.1   Magnesium 1.6  --              Recent Labs   Lab 03/28/22  1111   ALT 11   AST (SGOT) 12   Bilirubin, Total 1.1   Albumin 3.0*   Alkaline Phosphatase 83             Recent Labs   Lab 03/28/22  1111   hs Troponin-I 5.6             Recent Labs   Lab 03/29/22  0552   PT INR 1.3*   PT 15.4*   PTT 31         Microbiology Results (last 15 days)         Procedure Component Value Units Date/Time     Culture, Anaerobic Bacteria [[id number]][[id number]]       Order Status: Canceled Specimen: Body Fluid from Pleural Fluid       Culture + Gram Stain,Aerobic, Body Fluid [[id number]][[id number]]       Order Status: Canceled Specimen: Body Fluid from Pleural Fluid       Fungal Culture & Smear [[id number]][[id number]]       Order Status: Canceled Specimen: Body Fluid from Pleural Fluid       Urine culture [[id number]][[id number]] Collected: 03/28/22 1332     Order Status: No result Specimen: Urine Updated: 03/28/22 1355     COVID-19 (SARS-CoV-2) and Influenza A/B, NAA (Liat Rapid)- Admission [[id number]][[id number]] Collected: 03/28/22 1111     Order Status: Completed Specimen: Culturette from Nasopharyngeal Updated: 03/28/22 1140       Purpose of COVID testing Diagnostic -PUI       SARS-CoV-2 Specimen Source Nasal Swab       SARS CoV 2 Overall Result Not Detected       Comment: __________________________________________________  -A result of "Detected" indicates POSITIVE for the    presence of SARS CoV-2 RNA  -A result of "Not Detected" indicates NEGATIVE for the    presence of SARS CoV-2 RNA  __________________________________________________________  Test performed using the Roche cobas Liat  SARS-CoV-2 assay. This assay is  only for use under the Food and Drug Administration's Emergency Use  Authorization. This is a real-time RT-PCR assay for the qualitative  detection of SARS-CoV-2  RNA. Viral nucleic acids may persist in vivo,  independent of viability. Detection of viral nucleic acid does not imply the  presence of infectious virus, or that virus nucleic acid is the cause of  clinical symptoms. Negative results do not preclude SARS-CoV-2 infection and  should not be used as the sole basis for diagnosis, treatment or other  patient management decisions. Negative results must be combined with  clinical observations, patient history, and/or epidemiological information.  Invalid results may be due to inhibiting substances in the specimen and  recollection should occur. Please see Fact Sheets for patients and providers  located:  WirelessDSLBlog.no             Influenza A Not Detected       Influenza B Not Detected       Comment: Test performed using the Roche cobas Liat SARS-CoV-2 & Influenza A/B assay.  This assay is only for use under the Food and Drug Administration's  Emergency Use Authorization. This is a multiplex real-time RT-PCR assay  intended for the simultaneous in vitro qualitative detection and  differentiation of SARS-CoV-2, influenza A, and influenza B virus RNA. Viral  nucleic acids may persist in vivo, independent of viability. Detection of  viral nucleic acid does not imply the presence of infectious virus, or that  virus nucleic acid is the cause of clinical symptoms. Negative results do  not preclude SARS-CoV-2, influenza A, and/or influenza B infection and  should not be used as the sole basis for diagnosis, treatment or other  patient management decisions. Negative results must be combined with  clinical observations, patient history, and/or epidemiological information.  Invalid results may be due to inhibiting substances in the specimen and  recollection should occur. Please see Fact Sheets for patients and providers  located: http://www.rice.biz/.           Narrative:       o Collect and clearly label specimen type:  o  PREFERRED-Upper respiratory specimen: One Nasal Swab in  Transport Media.  o Hand deliver to laboratory ASAP  Diagnostic -PUI                RADIOLOGY      Radiological Procedure personally reviewed and concur with radiologist reports unless stated otherwise.     CT Chest without Contrast   Final Result    Pleural thickening in the right hemithorax is slightly   increased from previous exam in January, and atelectasis of the right lung   due to pleural disease is also slightly increased.       [name], MD   03/28/2022 3:22 PM       Chest AP Portable   Final Result       Worsening appearance of the right hemithorax since 09/26/2021.       Al Decant, MD   03/28/2022 11:49 AM             Signed,  Trinda Pascal, MD  8:30 AM 03/29/2022

## 2022-03-30 NOTE — Progress Notes (Incomplete)
Reviewed AVS with patient and spouse.  All questions answered.  Portable oxygen tank delivered.  PIV removed.  No home medications to return to patient.  No scripts to fill here.  Patient left unit via wheelchair to main lobby exit.

## 2022-03-30 NOTE — Discharge Summary (Signed)
[name]      Patient: [name] [name]  Admission Date: 03/28/2022   DOB: 03-02-1951  Discharge Date: 03/30/2022    MRN: [id number]  Discharge Attending: Imogene Burn, MD   Referring Physician: Oneita Hurt None, MD  PCP: Pcp, None, MD       DISCHARGE SUMMARY     Discharge Information   Admission Diagnosis:   Shortness of breath  Mesothelioma stage IV    Discharge Diagnosis:   Acute respiratory insufficiency requiring oxygen  Mesothelioma stage IV  Poor appetite    Admission Condition: fair  Discharge Condition: fair  Functional Status: Patient is independent with mobility/ambulation, transfers, ADL's, IADL's.  Discharge Disposition: Home with home O2 with [facility] appointment tomorrow    Discharge Medications:     Medication List        START taking these medications      cephALEXin 500 MG capsule  Commonly known as: KEFLEX  Take 1 capsule (500 mg) by mouth 4 (four) times daily for 7 days     dronabinol 2.5 MG capsule  Commonly known as: MARINOL  Take 1 capsule (2.5 mg) by mouth 2 (two) times daily            CONTINUE taking these medications      acetaminophen 500 MG tablet  Commonly known as: TYLENOL     albuterol sulfate HFA 108 (90 Base) MCG/ACT inhaler  Commonly known as: PROVENTIL     aspirin 81 MG EC tablet     atorvastatin 80 MG tablet  Commonly known as: LIPITOR     glucosamine-chondroitin 500-400 MG tablet     L-Lysine 1000 MG Tabs     lidocaine-prilocaine cream  Commonly known as: EMLA     Nasal Spray 0.05 % Soln     ondansetron 8 MG tablet  Commonly known as: ZOFRAN  Take 1 tablet (8 mg) by mouth every 8 hours as needed for nausea and/or vomiting.     prochlorperazine 10 MG tablet  Commonly known as: COMPAZINE     tamsulosin 0.4 MG Caps  Commonly known as: FLOMAX  Take 1 capsule (0.4 mg) by mouth nightly     terazosin 10 MG capsule  Commonly known as: HYTRIN  Take 1 capsule (10 mg) by mouth daily     UNABLE TO FIND            STOP taking these medications      pregabalin 25 MG  capsule  Commonly known as: LYRICA               Where to Get Your Medications        [id number] [place], [place] - [address] AT [facility] [address]  [address], [place] [address]      Hours: 24-hours Phone: [phone]   cephALEXin 500 MG capsule  dronabinol 2.5 MG capsule          Patient Lines/Drains/Airways Status       Active PICC Line / CVC Line / PIV Line / Drain / Airway / Intraosseous Line / Epidural Line / ART Line / Line / Wound / Pressure Ulcer / NG/OG Tube       Name Placement date Placement time Site Days    Port A Cath 02/18/21 Right Subclavian 02/18/21  1200  Subclavian  404    Peripheral IV 03/28/22 18 G Standard Left Antecubital 03/28/22  1110  Antecubital  2  Hospital Course   Presentation History   As per HPI    [name] [name] is a 71 y.o. 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     Patient presents with generalized weakness for the past 4 days as well as progressive SOB that he states has been slowly worsening over past several months, had previously discussed this with his oncologist. He has a history of malignant pleural effusions for which he has had thoracentesis x2 and thought he may benefit from another thoracentesis in the hospital. No acute sudden worsening SOB or chest pain, denies hemoptysis, no fevers. He previously was receiving chemotherapy last year which was switched to immunotherapy this year with [facility]. States he has been urinating more frequently for past several days, but denies dysuria. In the ED, VSS, not requiring supplemental oxygen. Labs Hgb 9.9, UA Tr LE, TNTC WBC. He was given CTX and IVF in ED. CXR initially concerning for right-sided loculated effusion, CT Chest subsequently did not reveal significant effusion, noted pleural thickening and atelectasis in right hemithorax. On my exam, patient resting  comfortably in bed, no respiratory distress, denies chest pain, no prior history of blood clots.       See HPI for details.    Hospital Course (0 Days)   Suspected UTI d/t weakness, will treat as complicated, 7 days. Still awaiting culture. Patient appears weak but per him at baseline with poor appetite.  Underlying cause likely progressive cancer.  He was short of breath on admission on ambulatory O2 assessment he is requiring oxygen with exertion.  No oxygen needed at rest.  Imaging did not reveal any reversible causes.  Start marinol, continue asa  F/u urine culture  Patient to be discharged so he can make his [facility]    Procedures/Imaging:   XR Chest 2 Views   Final Result      Stable right-sided predominantly basilar infiltrate and moderate right   effusion. Minimal left basilar atelectasis.      Prince Solian, MD   03/30/2022 8:09 AM      CT Chest without Contrast   Final Result    Pleural thickening in the right hemithorax is slightly   increased from previous exam in January, and atelectasis of the right lung   due to pleural disease is also slightly increased.      [name], MD   03/28/2022 3:22 PM      Chest AP Portable   Final Result      Worsening appearance of the right hemithorax since 09/26/2021.      Al Decant, MD   03/28/2022 11:49 AM          Treatment Team:   Attending Provider: Imogene Burn, MD               Progress Note/Physical Exam at Discharge     Subjective: Feels weak would like to have increased appetite.  Requesting stimulant denies any history of blood clot, stroke heart attack.    Vitals:    03/29/22 2324 03/30/22 0358 03/30/22 0734 03/30/22 1302   BP: 108/68 108/67 106/63 94/56   Pulse: 85 76 76 85   Resp: 18 15 16 16    Temp: 98.2 F (36.8 C) 98.2 F (36.8 C) 98.2 F (36.8 C) 98.1 F (36.7 C)   TempSrc: Oral Oral Oral Oral   SpO2: 94% 93% 96% 93%   Weight:  Height:           General: NAD, AAOx3, well nourished  HEENT: perrla, eomi,  sclera anicteric, OP: Clear, MMM  Neck: supple, FROM, no LAD  Cardiovascular: RRR, no m/r/g  Lungs: CTAB, no w/r/r  Abdomen: soft, +BS, NT/ND, no masses, no g/r  Extremities: no C/C/E  Skin: no rashes or lesions noted  Neuro: CN 2-12 intact; No Focal neurological deficits       Diagnostics     Labs/Studies Pending at Discharge: No    Last Labs   Recent Labs   Lab 03/30/22  0725 03/29/22  0552 03/28/22  1111   WBC 6.84 7.11 8.70   RBC 3.19* 3.14* 3.38*   Hgb 9.2* 9.1* 9.9*   Hematocrit 29.6* 28.7* 30.7*   MCV 92.8 91.4 90.8   Platelets 327 302 316       Recent Labs   Lab 03/30/22  0725 03/29/22  0552 03/28/22  1111   Sodium 136 135 135   Potassium 3.9 3.6 4.1   Chloride 98* 99 99   CO2 28 27 26    BUN 10.0 11.0 11.0   Creatinine 0.8 0.8 0.9   Glucose 101* 99 112*   Calcium 8.7 8.4 9.1   Magnesium 1.7 1.6  --        Microbiology Results (last 15 days)       Procedure Component Value Units Date/Time    Culture, Anaerobic Bacteria [[id number]]     Order Status: Canceled Specimen: Body Fluid from Pleural Fluid     Culture + Gram Stain,Aerobic, Body Fluid [[id number]]     Order Status: Canceled Specimen: Body Fluid from Pleural Fluid     Fungal Culture & Smear [[id number]]     Order Status: Canceled Specimen: Body Fluid from Pleural Fluid     Urine culture [[id number]] Collected: 03/28/22 1332    Order Status: Completed Specimen: Bladder Updated: 03/29/22 1658    Narrative:      ORDER#: [id number]                                    ORDERED BY: LATIF, AAMIR  SOURCE: Urine                                        COLLECTED:  03/28/22 13:32  ANTIBIOTICS AT COLL.:                                RECEIVED :  03/28/22 13:35  Culture Urine                              PRELIM      03/29/22 16:58   +  03/29/22   Culture requires further incubation, results to follow      COVID-19 (SARS-CoV-2) and Influenza A/B, NAA (Liat Rapid)- Admission [[id number]] Collected: 03/28/22 1111    Order Status: Completed Specimen: Culturette from  Nasopharyngeal Updated: 03/28/22 1140     Purpose of COVID testing Diagnostic -PUI     SARS-CoV-2 Specimen Source Nasal Swab     SARS CoV 2 Overall Result Not Detected     Comment: __________________________________________________  -A result of "Detected" indicates POSITIVE for the    presence  of SARS CoV-2 RNA  -A result of "Not Detected" indicates NEGATIVE for the    presence of SARS CoV-2 RNA  __________________________________________________________  Test performed using the Roche cobas Liat SARS-CoV-2 assay. This assay is  only for use under the Food and Drug Administration's Emergency Use  Authorization. This is a real-time RT-PCR assay for the qualitative  detection of SARS-CoV-2 RNA. Viral nucleic acids may persist in vivo,  independent of viability. Detection of viral nucleic acid does not imply the  presence of infectious virus, or that virus nucleic acid is the cause of  clinical symptoms. Negative results do not preclude SARS-CoV-2 infection and  should not be used as the sole basis for diagnosis, treatment or other  patient management decisions. Negative results must be combined with  clinical observations, patient history, and/or epidemiological information.  Invalid results may be due to inhibiting substances in the specimen and  recollection should occur. Please see Fact Sheets for patients and providers  located:  WirelessDSLBlog.no          Influenza A Not Detected     Influenza B Not Detected     Comment: Test performed using the Roche cobas Liat SARS-CoV-2 & Influenza A/B assay.  This assay is only for use under the Food and Drug Administration's  Emergency Use Authorization. This is a multiplex real-time RT-PCR assay  intended for the simultaneous in vitro qualitative detection and  differentiation of SARS-CoV-2, influenza A, and influenza B virus RNA. Viral  nucleic acids may persist in vivo, independent of viability. Detection of  viral nucleic acid does not imply  the presence of infectious virus, or that  virus nucleic acid is the cause of clinical symptoms. Negative results do  not preclude SARS-CoV-2, influenza A, and/or influenza B infection and  should not be used as the sole basis for diagnosis, treatment or other  patient management decisions. Negative results must be combined with  clinical observations, patient history, and/or epidemiological information.  Invalid results may be due to inhibiting substances in the specimen and  recollection should occur. Please see Fact Sheets for patients and providers  located: http://www.rice.biz/.         Narrative:      o Collect and clearly label specimen type:  o PREFERRED-Upper respiratory specimen: One Nasal Swab in  Transport Media.  o Hand deliver to laboratory ASAP  Diagnostic -PUI             Patient Instructions   Discharge Diet: regular diet  Discharge Activity:  activity as tolerated    Follow Up Appointment:   Follow-up Information       Judieth Keens, MD In 3 days.    Specialties: Medical Oncology, Hematology, Internal Medicine  Contact information:  [address]  [address] - [place] [address]  [phone]                              Time spent examining patient, discussing with patient/family regarding hospital course, chart review, reconciling medications and discharge planning: 45 minutes.    Signed,  Imogene Burn, MD  5:28 PM 03/30/2022     This note was generated by the [facility] EMR system/Dragon speech recognition and may contain inherent errors or omissions not intended by the user. Grammatical errors, random word insertions, deletions, pronoun errors and incomplete sentences are occasional consequences of this technology due to software limitations. Not all errors  are caught or corrected. If there are questions or concerns about the content of this note or information contained within the body of this dictation they should be addressed directly with the author for  clarification.

## 2022-03-30 NOTE — Discharge Instr - AVS First Page (Addendum)
Reason for your Hospital Admission:  Shortness of breath      Instructions for after your discharge:  Marinol trial for appetite stimulation. It can have adverse effects of blood clotting. Continue home aspirin.  If lower extremity swelling/pain/redness please come to the ER for a scan.

## 2022-03-30 NOTE — Progress Notes (Signed)
[facility])   (P): [phone]   (F): [phone]    Note:  Home Oxygen Order sent to [employer] above. Awaiting approval.

## 2022-03-30 NOTE — Malnutrition Assessment (Signed)
[name] [name] is a 71 y.o. male patient.   [id number]    Malnutrition Assessment     Malnutrition Documentation    Severe Malnutrition related to poor appetite in the setting of acute illness as evidenced by </= 50% of estimated energy requirements for => 5 days and  3.1% wt loss x<2 weeks.        Donetta Potts, RDN, LDN  Clinical Dietitian  [phone]    If physician disagrees with this assessment see addendum.

## 2022-03-30 NOTE — Consults (Signed)
Nutrition Assessment    Kriss Ishler 71 y.o. male   MRN: [id number]      Reason for Assessment: Consult - "patient has had unintentional wt loss over last several weeks.  Has very poor appetite."     Nutrition Recommendation:  Goal: PO intake >50% of meals and supplements.     Plan:   1) Adjusted to regular diet    Recommend fully liberalized diet until consistently consuming >75% of meals       2) Adjusted ONS per pt preference. D/c Ensure Plus High Protein, TID. Provide Ensure Clear (mixed berry), BID and Magic Cup (orange), BID to optimize intake      3) Encourage small, frequent intake and document % of meals/ONS consumed in I/O flowsheet       4) Start Marinol for appetite stimulant (ordered as per MAR)    5) Recommended 1 Boost Very High Calorie (530 Kcal, 22g protein/8 fl oz) at each meal at d/c until consuming >50% of meals (d/w pt and wife)     6) Added ONS rec to AVS      Donetta Potts, RDN, LDN  Clinical Dietitian  [phone]  ____________________________________________________________________      Assessment Data:  Summary: 71 y.o. male who presented with generalized weakness  x4 days and progressive SOB over past several months. PMHx is significant for Arthritis, HLD, BPH, malignant pleural effusions s/p thoracentesis x2, and Mesothelioma (on Immunotherapy). In the ED, VSS, not requiring supplemental oxygen. CXR initially concerning for right-sided loculated effusion, CT Chest subsequently did not reveal significant effusion, noted pleural thickening and atelectasis in right hemithorax. Pt given Lasix. +Abnormal UA-+UTI. Onc consulted. No e/o lung infection or pneumonitis noted on scan. Onc rec comfort & supportive measures. [facility]. Pt requiring 2L O2 as of 7/24. Now requires home O2. HH working on [employer] for home O2.         Pt seen, awake and alert. Wife present. +NC. Reports poor appetite/intake for ~6 days PTA, was eating at <50% of baseline. Reports NKFA. Denies  difficulties chewing/swallowing. Pt currently ordered for low Na+ diet. Stated that appetite remains fair. Only had 75% x1 muffin for dinner and a popsicle last night. Will adjust to regular. Had 1 Ensure yesterday and only sips of one today as he prefers Boost. Of meals/ONS documented, average intake of 50% x2 meals and 75% x1 ONS recorded. C/o wt loss. Reports UBW ~218-219#, weighed this as recently as 6 days ago. Admission wt was 218#. Noted wt was 225# on 7/10 but pt states wt was high given that he had all his clothes on. Wife thinks this was pt's true wt as he hasn't been eating much lately. Noted significant wt loss per chart. See wt hx detailed below. No signs of muscle/fat wasting observed per NFPE as detailed below. Denies N/V. LBM was 1H ago-solid. No significant peripheral edema. With suboptimal intake and wt loss, pt currently meets criteria for malnutrition. Encouraged good PO intake to support nutritional needs. D/w pt adjusting ONS while inpatient and adjust to Boost once d/c'd. Recommended Boost Very High Calorie (530 Kcal, 22g protein/8 fl oz) as meal replacement if still not taking meals. Also encourage small, frequent feedings to optimize overall intake. Noted Marinol added today. Will continue to monitor plan of care.        Weight Monitoring     Weight Weight Method   12/21/2021 101.969 kg     01/06/2022 102.513  kg     01/29/2022 101.696 kg     02/22/2022 100.245 kg  Actual    02/25/2022 101.969 kg     03/01/2022 102.059 kg     03/12/2022 102.059 kg     03/15/2022 102.059 kg  Standing Scale    03/16/2022 99.791 kg     03/28/2022 98.9 kg  Standing Scale    Per chart, 3.1% wt loss x<2 weeks (clinically significant).       Adm dx:  Pleural effusion   Patient Active Problem List   Diagnosis    Mesothelioma    Encounter for screening for other suspected endocrine disorder    Encounter for long-term (current) use of medications    Benign prostatic hyperplasia with weak urinary stream    Gross hematuria     Pleural effusion       PMH:  has a past medical history of Arthritis, Hyperlipidemia, Lung anomaly, and Malignant neoplasm.    Recent Labs   Lab 03/30/22  0725 03/29/22  0552 03/28/22  1111   Sodium 136 135 135   Potassium 3.9 3.6 4.1   Chloride 98* 99 99   CO2 28 27 26    BUN 10.0 11.0 11.0   Creatinine 0.8 0.8 0.9   Glucose 101* 99 112*   Calcium 8.7 8.4 9.1   Magnesium 1.7 1.6  --    eGFR >60.0 >60.0 >60.0   WBC 6.84 7.11 8.70   Hematocrit 29.6* 28.7* 30.7*   Hgb 9.2* 9.1* 9.9*   Bilirubin, Total  --   --  1.1   AST (SGOT)  --   --  12   ALT  --   --  11   Alkaline Phosphatase  --   --  83       Current Facility-Administered Medications   Medication Dose Route Frequency    aspirin EC  81 mg Oral Daily    atorvastatin  80 mg Oral Daily    cefTRIAXone  1 g Intravenous Q24H    dronabinol  2.5 mg Oral BID    heparin (porcine)  5,000 Units Subcutaneous Q12H [facility]    terazosin  10 mg Oral Daily     PRN meds given in the past 48 hours: N/A     Intake History:     Orders Placed This Encounter      Diet low sodium Na restriction: 2 GM NA    Orders Placed This Encounter   Procedures    Diet low sodium Na restriction: 2 GM NA    Ensure Plus Hi Protein Supplement Quantity: A. One; Flavor: Chocolate; Frequency: TID (3 times a day) with meals     Food intake: Of meals/ONS documented, average intake of 50% x2 meals and 75% x1 ONS recorded.       No Known Allergies    Nutrition Focused Physical Exam:  Head: no overt s/s subcutaneous muscle or fat loss  Upper Body - no overt s/s subcutaneous muscle or fat loss  Lower Body - no s/s subcutaneous fat or muscle loss  Edema - No significant peripheral edema.    Skin - Warm, dry.   GI function - No complaints.      Anthropometrics  Height: 172.7 cm (5\' 8" )  Weight: 98.9 kg (218 lb 0.6 oz)  IBW/kg (Calculated) Male: 70.02 kg  IBW/kg (Calculated) Male: 63.62 kg  BMI (calculated): 33.2  Ideal body weight: 68.4 kg (150 lb 12.7 oz)  Adjusted ideal body weight: 80.6 kg (177  lb 11.1 oz)      Total Daily Energy Needs: 1978 to 2472.5 kcal  Method for Calculating Energy Needs: 20 kcal - 25 kcal per kg  at 98.9 kg (Actual body weight)       Total Daily Protein Needs: 82.08 to 102.6 g  Method for Calculating Protein Needs: 1.2 g - 1.5 g per kg at 68.4 kg (0 body weight)       Total Daily Fluid Needs: 2472.5 to 2967 ml  Method for Calculating Fluid Needs: 25 ml - 30 ml  per kg at 98.9 kg (Actual body weight)       Nutrition Diagnosis:     Severe Malnutrition related to poor appetite in the setting of acute illness as evidenced by </= 50% of estimated energy requirements for => 5 days and  3.1% wt loss x<2 weeks.      Intervention:  Goal: PO intake >50% of meals and supplements.     Plan:   1) Adjusted to regular diet    Recommend fully liberalized diet until consistently consuming >75% of meals       2) Adjusted ONS per pt preference. D/c Ensure Plus High Protein, TID. Provide Ensure Clear (mixed berry), BID and Magic Cup (orange), BID to optimize intake      3) Encourage small, frequent intake and document % of meals/ONS consumed in I/O flowsheet       4) Start Marinol for appetite stimulant (ordered as per MAR)    5) Recommended 1 Boost Very High Calorie (530 Kcal, 22g protein/8 fl oz) at each meal at d/c until consuming >50% of meals (d/w pt and wife)     6) Added ONS rec to AVS      Monitoring/Evaluation:   Intake  Weights  GI symptoms    Donetta Potts, RDN, LDN  Clinical Dietitian  [phone]

## 2022-03-30 NOTE — Nursing Progress Note (Signed)
PULSE OXIMETRY TESTING:   Document patient's oxygen at rest on room air:   93% on room air, at rest (No ranges please)   _____% on oxygen, at rest at_____LPM via NC   IF 88% OR BELOW on room air, STOP HERE,   IF NOT ambulate patient on room air with exertion and document below:   87% on room air, with exertion (must be 88% or below)   90% on oxygen, with exertion, at 2 LPM via NC   All three tests must be during the same session.   Please document in a PROGRESS NOTE.   Testing to qualify for home oxygen must be no earlier than 48 hours prior to discharge, or it will need to be repeated.   Please call the home health care liaison at [phone] or case manager when completed.       Ambulated approximately 250 ft. In hallways w/ RN supervision.

## 2022-03-30 NOTE — Discharge Instr - Diet (Signed)
Recommended 1 Boost Very High Calorie (530 Kcal, 22g protein/8 fl oz) at each meal at d/c until consuming >50% of meals.

## 2022-03-30 NOTE — Plan of Care (Signed)
Patient a/ox4, independent to the bathroom. Small BM x 1. Denies pain. On 2L O2 via n/c.  Bed in a lowest position. Call light within reach.    Problem: Moderate/High Fall Risk Score >5  Goal: Patient will remain free of falls  Flowsheets (Taken 03/30/2022 0120)  Moderate Risk (6-13):   LOW-Fall Interventions Appropriate for Low Fall Risk   MOD-Consider activation of bed alarm if appropriate   MOD-Apply bed exit alarm if patient is confused     Problem: Inadequate Tissue Perfusion-Venous  Goal: Tissue perfusion is adequate-venous  Flowsheets (Taken 03/30/2022 0120)  Tissue perfusion is adequate-venous:   Increase activity as tolerated / progressive mobility   Teach/review/reinforce ankle pump exercises     Problem: Nutrition  Goal: Nutritional intake is adequate  Flowsheets (Taken 03/30/2022 0120)  Nutritional intake is adequate:   Allow adequate time for meals   Encourage/administer dietary supplements as ordered (i.e. tube feed, TPN, oral, OGT/NGT, supplements)   Consult/collaborate with Clinical Nutritionist

## 2022-03-31 ENCOUNTER — Telehealth: Payer: Self-pay

## 2022-03-31 ENCOUNTER — Ambulatory Visit: Payer: Medicare Other

## 2022-03-31 ENCOUNTER — Ambulatory Visit: Payer: Self-pay | Admitting: Oncology

## 2022-03-31 NOTE — Telephone Encounter (Signed)
Telephone call from patients wife requesting Prior Authorization [facility] prescriptions [facility]      1) Keflex 500 mg capsules  4x a day    2) Marinol 2.5 mg capsules.2x a day    [facility] wife      She can be reached at [phone]

## 2022-03-31 NOTE — Progress Notes (Signed)
Patient has been discharged from [facility] . He already has follow up with Dr. Fernanda Drum scheduled on 04/05/22.                          Stephanie Acre. Smiley-Flowers, MSN, OCN, FNP-BC  HEMATOLOGY/ONCOLOGY  [facility] [place]

## 2022-04-01 ENCOUNTER — Other Ambulatory Visit: Payer: Self-pay | Admitting: Medical Oncology

## 2022-04-01 DIAGNOSIS — C459 Mesothelioma, unspecified: Secondary | ICD-10-CM

## 2022-04-01 MED ORDER — CEPHALEXIN 500 MG PO CAPS
500.0000 mg | ORAL_CAPSULE | Freq: Four times a day (QID) | ORAL | 0 refills | Status: DC
Start: 2022-04-01 — End: 2022-04-08

## 2022-04-01 MED ORDER — DRONABINOL 2.5 MG PO CAPS
2.5000 mg | ORAL_CAPSULE | Freq: Two times a day (BID) | ORAL | 0 refills | Status: DC
Start: 2022-04-01 — End: 2022-05-13

## 2022-04-02 ENCOUNTER — Other Ambulatory Visit: Payer: Self-pay | Admitting: Medical Oncology

## 2022-04-02 DIAGNOSIS — Z79899 Other long term (current) drug therapy: Secondary | ICD-10-CM

## 2022-04-02 DIAGNOSIS — Z1329 Encounter for screening for other suspected endocrine disorder: Secondary | ICD-10-CM

## 2022-04-02 DIAGNOSIS — C459 Mesothelioma, unspecified: Secondary | ICD-10-CM

## 2022-04-05 ENCOUNTER — Encounter: Payer: Self-pay | Admitting: Medical Oncology

## 2022-04-05 ENCOUNTER — Ambulatory Visit: Payer: Medicare Other

## 2022-04-05 ENCOUNTER — Inpatient Hospital Stay
Admission: EM | Admit: 2022-04-05 | Discharge: 2022-04-08 | DRG: 872 | Disposition: A | Discharge status: Hospice | Payer: Medicare Other | Attending: Internal Medicine | Admitting: Internal Medicine

## 2022-04-05 ENCOUNTER — Ambulatory Visit (INDEPENDENT_AMBULATORY_CARE_PROVIDER_SITE_OTHER): Payer: Medicare Other | Admitting: Medical Oncology

## 2022-04-05 ENCOUNTER — Emergency Department: Payer: Medicare Other

## 2022-04-05 VITALS — BP 105/66 | HR 93 | Temp 100.8°F | Wt 213.6 lb

## 2022-04-05 DIAGNOSIS — R6510 Systemic inflammatory response syndrome (SIRS) of non-infectious origin without acute organ dysfunction: Secondary | ICD-10-CM

## 2022-04-05 DIAGNOSIS — C459 Mesothelioma, unspecified: Secondary | ICD-10-CM

## 2022-04-05 DIAGNOSIS — R531 Weakness: Secondary | ICD-10-CM

## 2022-04-05 DIAGNOSIS — R3912 Poor urinary stream: Secondary | ICD-10-CM

## 2022-04-05 DIAGNOSIS — Z87891 Personal history of nicotine dependence: Secondary | ICD-10-CM

## 2022-04-05 DIAGNOSIS — R5381 Other malaise: Secondary | ICD-10-CM

## 2022-04-05 DIAGNOSIS — J9611 Chronic respiratory failure with hypoxia: Secondary | ICD-10-CM | POA: Diagnosis present

## 2022-04-05 DIAGNOSIS — C450 Mesothelioma of pleura: Secondary | ICD-10-CM | POA: Diagnosis present

## 2022-04-05 DIAGNOSIS — B952 Enterococcus as the cause of diseases classified elsewhere: Secondary | ICD-10-CM

## 2022-04-05 DIAGNOSIS — J910 Malignant pleural effusion: Secondary | ICD-10-CM | POA: Diagnosis present

## 2022-04-05 DIAGNOSIS — N401 Enlarged prostate with lower urinary tract symptoms: Secondary | ICD-10-CM

## 2022-04-05 DIAGNOSIS — E871 Hypo-osmolality and hyponatremia: Secondary | ICD-10-CM | POA: Diagnosis present

## 2022-04-05 DIAGNOSIS — M1990 Unspecified osteoarthritis, unspecified site: Secondary | ICD-10-CM | POA: Diagnosis present

## 2022-04-05 DIAGNOSIS — Z9981 Dependence on supplemental oxygen: Secondary | ICD-10-CM

## 2022-04-05 DIAGNOSIS — R6889 Other general symptoms and signs: Secondary | ICD-10-CM

## 2022-04-05 DIAGNOSIS — G893 Neoplasm related pain (acute) (chronic): Secondary | ICD-10-CM | POA: Diagnosis present

## 2022-04-05 DIAGNOSIS — R509 Fever, unspecified: Secondary | ICD-10-CM | POA: Diagnosis present

## 2022-04-05 DIAGNOSIS — A419 Sepsis, unspecified organism: Principal | ICD-10-CM | POA: Diagnosis present

## 2022-04-05 DIAGNOSIS — Z1329 Encounter for screening for other suspected endocrine disorder: Secondary | ICD-10-CM

## 2022-04-05 DIAGNOSIS — N390 Urinary tract infection, site not specified: Secondary | ICD-10-CM | POA: Insufficient documentation

## 2022-04-05 DIAGNOSIS — E669 Obesity, unspecified: Secondary | ICD-10-CM | POA: Diagnosis present

## 2022-04-05 DIAGNOSIS — Z66 Do not resuscitate: Secondary | ICD-10-CM | POA: Diagnosis present

## 2022-04-05 DIAGNOSIS — R627 Adult failure to thrive: Secondary | ICD-10-CM | POA: Diagnosis present

## 2022-04-05 DIAGNOSIS — D849 Immunodeficiency, unspecified: Secondary | ICD-10-CM | POA: Diagnosis present

## 2022-04-05 DIAGNOSIS — Z6832 Body mass index (BMI) 32.0-32.9, adult: Secondary | ICD-10-CM

## 2022-04-05 DIAGNOSIS — J90 Pleural effusion, not elsewhere classified: Secondary | ICD-10-CM

## 2022-04-05 DIAGNOSIS — D638 Anemia in other chronic diseases classified elsewhere: Secondary | ICD-10-CM

## 2022-04-05 DIAGNOSIS — D649 Anemia, unspecified: Secondary | ICD-10-CM | POA: Diagnosis present

## 2022-04-05 DIAGNOSIS — E785 Hyperlipidemia, unspecified: Secondary | ICD-10-CM | POA: Diagnosis present

## 2022-04-05 LAB — CBC AND DIFFERENTIAL
Absolute NRBC: 0 10*3/uL (ref 0.00–0.00)
Absolute NRBC: 0 10*3/uL (ref 0.00–0.00)
Basophils Absolute Automated: 0.06 10*3/uL (ref 0.00–0.08)
Basophils Absolute Automated: 0.06 10*3/uL (ref 0.00–0.08)
Basophils Automated: 0.8 %
Basophils Automated: 0.8 %
Eosinophils Absolute Automated: 0.62 10*3/uL — ABNORMAL HIGH (ref 0.00–0.44)
Eosinophils Absolute Automated: 0.63 10*3/uL — ABNORMAL HIGH (ref 0.00–0.44)
Eosinophils Automated: 8.3 %
Eosinophils Automated: 8.7 %
Hematocrit: 27.1 % — ABNORMAL LOW (ref 37.6–49.6)
Hematocrit: 27.2 % — ABNORMAL LOW (ref 37.6–49.6)
Hgb: 8.8 g/dL — ABNORMAL LOW (ref 12.5–17.1)
Hgb: 8.5 g/dL — ABNORMAL LOW (ref 12.5–17.1)
Immature Granulocytes Absolute: 0.05 10*3/uL (ref 0.00–0.07)
Immature Granulocytes Absolute: 0.02 10*3/uL (ref 0.00–0.07)
Immature Granulocytes: 0.7 %
Immature Granulocytes: 0.3 %
Instrument Absolute Neutrophil Count: 4.79 10*3/uL (ref 1.10–6.33)
Instrument Absolute Neutrophil Count: 4.57 10*3/uL (ref 1.10–6.33)
Lymphocytes Absolute Automated: 1.13 10*3/uL (ref 0.42–3.22)
Lymphocytes Absolute Automated: 1.05 10*3/uL (ref 0.42–3.22)
Lymphocytes Automated: 15.2 %
Lymphocytes Automated: 14.6 %
MCH: 28.8 pg (ref 25.1–33.5)
MCH: 28 pg (ref 25.1–33.5)
MCHC: 32.5 g/dL (ref 31.5–35.8)
MCHC: 31.3 g/dL — ABNORMAL LOW (ref 31.5–35.8)
MCV: 88.6 fL (ref 78.0–96.0)
MCV: 89.5 fL (ref 78.0–96.0)
MPV: 9.3 fL (ref 8.9–12.5)
MPV: 9.7 fL (ref 8.9–12.5)
Monocytes Absolute Automated: 0.8 10*3/uL (ref 0.21–0.85)
Monocytes Absolute Automated: 0.88 10*3/uL — ABNORMAL HIGH (ref 0.21–0.85)
Monocytes: 10.7 %
Monocytes: 12.2 %
Neutrophils Absolute: 4.79 10*3/uL (ref 1.10–6.33)
Neutrophils Absolute: 4.57 10*3/uL (ref 1.10–6.33)
Neutrophils: 64.3 %
Neutrophils: 63.4 %
Nucleated RBC: 0 /100 WBC (ref 0.0–0.0)
Nucleated RBC: 0 /100 WBC (ref 0.0–0.0)
Platelets: 312 10*3/uL (ref 142–346)
Platelets: 334 10*3/uL (ref 142–346)
RBC: 3.06 10*6/uL — ABNORMAL LOW (ref 4.20–5.90)
RBC: 3.04 10*6/uL — ABNORMAL LOW (ref 4.20–5.90)
RDW: 15 % (ref 11–15)
RDW: 15 % (ref 11–15)
WBC: 7.45 10*3/uL (ref 3.10–9.50)
WBC: 7.21 10*3/uL (ref 3.10–9.50)

## 2022-04-05 LAB — HEMOLYSIS INDEX: Hemolysis Index: 0 Index (ref 0–24)

## 2022-04-05 LAB — COMPREHENSIVE METABOLIC PANEL
ALT: 13 U/L (ref 0–55)
ALT: 14 U/L (ref 0–55)
AST (SGOT): 20 U/L (ref 5–41)
AST (SGOT): 19 U/L (ref 5–41)
Albumin/Globulin Ratio: 0.6 — ABNORMAL LOW (ref 0.9–2.2)
Albumin/Globulin Ratio: 0.6 — ABNORMAL LOW (ref 0.9–2.2)
Albumin: 2.7 g/dL — ABNORMAL LOW (ref 3.5–5.0)
Albumin: 2.6 g/dL — ABNORMAL LOW (ref 3.5–5.0)
Alkaline Phosphatase: 67 U/L (ref 37–117)
Alkaline Phosphatase: 68 U/L (ref 37–117)
Anion Gap: 7 (ref 5.0–15.0)
Anion Gap: 8 (ref 5.0–15.0)
BUN: 13 mg/dL (ref 9.0–28.0)
BUN: 12 mg/dL (ref 9.0–28.0)
Bilirubin, Total: 0.6 mg/dL (ref 0.2–1.2)
Bilirubin, Total: 0.7 mg/dL (ref 0.2–1.2)
CO2: 29 mEq/L (ref 17–29)
CO2: 26 mEq/L (ref 17–29)
Calcium: 9.9 mg/dL (ref 7.9–10.2)
Calcium: 9 mg/dL (ref 7.9–10.2)
Chloride: 97 mEq/L — ABNORMAL LOW (ref 99–111)
Chloride: 98 mEq/L — ABNORMAL LOW (ref 99–111)
Creatinine: 0.9 mg/dL (ref 0.5–1.5)
Creatinine: 0.8 mg/dL (ref 0.5–1.5)
Globulin: 4.4 g/dL — ABNORMAL HIGH (ref 2.0–3.6)
Globulin: 4.2 g/dL — ABNORMAL HIGH (ref 2.0–3.6)
Glucose: 160 mg/dL — ABNORMAL HIGH (ref 70–100)
Glucose: 99 mg/dL (ref 70–100)
Potassium: 3.9 mEq/L (ref 3.5–5.3)
Potassium: 3.9 mEq/L (ref 3.5–5.3)
Protein, Total: 7.1 g/dL (ref 6.0–8.3)
Protein, Total: 6.8 g/dL (ref 6.0–8.3)
Sodium: 133 mEq/L — ABNORMAL LOW (ref 135–145)
Sodium: 132 mEq/L — ABNORMAL LOW (ref 135–145)
eGFR: >60 mL/min/{1.73_m2} (ref >=60)
eGFR: >60 mL/min/{1.73_m2} (ref >=60)

## 2022-04-05 LAB — ECG 12-LEAD
P Axis: 55 degrees
P-R Interval: 126 ms
Q-T Interval: 358 ms
QTC Calculation (Bezet): 415 ms
T Axis: 18 degrees
Ventricular Rate: 81 {beats}/min

## 2022-04-05 LAB — COVID-19 (SARS-COV-2): SARS CoV 2 Overall Result: NOT DETECTED

## 2022-04-05 LAB — PROCALCITONIN: Procalcitonin: 0.06 ng/ml (ref 0.00–0.10)

## 2022-04-05 LAB — T4, FREE: T4 Free: 0.83 ng/dL (ref 0.69–1.48)

## 2022-04-05 LAB — CORTISOL: Cortisol: 5.1 ug/dL

## 2022-04-05 LAB — LACTIC ACID, PLASMA: Lactic Acid: 0.7 mmol/L (ref 0.2–2.0)

## 2022-04-05 LAB — TSH: TSH: 2.72 u[IU]/mL (ref 0.35–4.94)

## 2022-04-05 MED ORDER — SODIUM CHLORIDE 0.9 % IV BOLUS
500.0000 mL | Freq: Once | INTRAVENOUS | Status: AC
Start: 2022-04-05 — End: 2022-04-05
  Administered 2022-04-05: 500 mL via INTRAVENOUS

## 2022-04-05 MED ORDER — PREGABALIN 25 MG PO CAPS
25.0000 mg | ORAL_CAPSULE | Freq: Three times a day (TID) | ORAL | Status: DC
Start: 2022-04-05 — End: 2022-04-08
  Administered 2022-04-06 – 2022-04-08 (×6): 25 mg via ORAL
  Filled 2022-04-05 (×8): qty 1

## 2022-04-05 MED ORDER — STERILE WATER FOR INJECTION IJ/IV SOLN (WRAP)
2.0000 g | Freq: Once | Status: AC
Start: 2022-04-05 — End: 2022-04-05
  Administered 2022-04-05: 2 g via INTRAVENOUS
  Filled 2022-04-05: qty 2

## 2022-04-05 MED ORDER — PROCHLORPERAZINE EDISYLATE 10 MG/2ML IJ SOLN
10.0000 mg | INTRAMUSCULAR | Status: DC | PRN
Start: 2022-04-05 — End: 2022-04-08

## 2022-04-05 MED ORDER — POTASSIUM & SODIUM PHOSPHATES 280-160-250 MG PO PACK
2.0000 | PACK | ORAL | Status: DC | PRN
Start: 2022-04-05 — End: 2022-04-08

## 2022-04-05 MED ORDER — ACETAMINOPHEN 500 MG PO TABS
1000.0000 mg | ORAL_TABLET | Freq: Four times a day (QID) | ORAL | Status: DC | PRN
Start: 2022-04-05 — End: 2022-04-08
  Administered 2022-04-05 – 2022-04-07 (×3): 1000 mg via ORAL
  Filled 2022-04-05 (×3): qty 2

## 2022-04-05 MED ORDER — ENOXAPARIN SODIUM 40 MG/0.4ML IJ SOSY
40.0000 mg | PREFILLED_SYRINGE | Freq: Every day | INTRAMUSCULAR | Status: DC
Start: 2022-04-05 — End: 2022-04-08
  Administered 2022-04-05 – 2022-04-07 (×2): 40 mg via SUBCUTANEOUS
  Filled 2022-04-05 (×4): qty 0.4

## 2022-04-05 MED ORDER — GLUCAGON 1 MG IJ SOLR (WRAP)
1.0000 mg | INTRAMUSCULAR | Status: DC | PRN
Start: 2022-04-05 — End: 2022-04-08

## 2022-04-05 MED ORDER — VANCOMYCIN PHARMACY TO DOSE PLACEHOLDER
INTRAVENOUS | Status: DC
Start: 2022-04-05 — End: 2022-04-06

## 2022-04-05 MED ORDER — TERAZOSIN HCL 5 MG PO CAPS
10.0000 mg | ORAL_CAPSULE | Freq: Every evening | ORAL | Status: DC
Start: 2022-04-05 — End: 2022-04-08
  Administered 2022-04-05 – 2022-04-07 (×2): 10 mg via ORAL
  Filled 2022-04-05 (×5): qty 2

## 2022-04-05 MED ORDER — SODIUM CHLORIDE 0.9 % IV MBP
2.0000 g | Freq: Three times a day (TID) | INTRAVENOUS | Status: DC
Start: 2022-04-05 — End: 2022-04-06
  Administered 2022-04-05 – 2022-04-06 (×2): 2 g via INTRAVENOUS
  Filled 2022-04-05 (×2): qty 2

## 2022-04-05 MED ORDER — MELATONIN 3 MG PO TABS
3.0000 mg | ORAL_TABLET | Freq: Every evening | ORAL | Status: DC | PRN
Start: 2022-04-05 — End: 2022-04-08
  Filled 2022-04-05: qty 1

## 2022-04-05 MED ORDER — SODIUM PHOSPHATES 3 MMOLE/ML IV SOLN (WRAP)
20.0000 mmol | INTRAVENOUS | Status: DC | PRN
Start: 2022-04-05 — End: 2022-04-08

## 2022-04-05 MED ORDER — NICOTINE 14 MG/24HR TD PT24
1.0000 | MEDICATED_PATCH | Freq: Every day | TRANSDERMAL | Status: DC | PRN
Start: 2022-04-05 — End: 2022-04-08
  Filled 2022-04-05: qty 1

## 2022-04-05 MED ORDER — ONDANSETRON HCL 4 MG/2ML IJ SOLN
4.0000 mg | Freq: Four times a day (QID) | INTRAMUSCULAR | Status: DC | PRN
Start: 2022-04-05 — End: 2022-04-08
  Administered 2022-04-06: 4 mg via INTRAVENOUS
  Filled 2022-04-05: qty 2

## 2022-04-05 MED ORDER — OXYCODONE HCL 5 MG PO TABS
5.0000 mg | ORAL_TABLET | Freq: Four times a day (QID) | ORAL | Status: DC | PRN
Start: 2022-04-05 — End: 2022-04-08

## 2022-04-05 MED ORDER — POTASSIUM CHLORIDE CRYS ER 20 MEQ PO TBCR
0.0000 meq | EXTENDED_RELEASE_TABLET | ORAL | Status: DC | PRN
Start: 2022-04-05 — End: 2022-04-08
  Administered 2022-04-07: 20 meq via ORAL
  Administered 2022-04-08: 40 meq via ORAL
  Filled 2022-04-05: qty 2
  Filled 2022-04-05: qty 1

## 2022-04-05 MED ORDER — NALOXONE HCL 0.4 MG/ML IJ SOLN (WRAP)
0.2000 mg | INTRAMUSCULAR | Status: DC | PRN
Start: 2022-04-05 — End: 2022-04-08

## 2022-04-05 MED ORDER — POLYETHYLENE GLYCOL 3350 17 G PO PACK
17.0000 g | PACK | Freq: Every day | ORAL | Status: DC | PRN
Start: 2022-04-05 — End: 2022-04-08
  Filled 2022-04-05: qty 1

## 2022-04-05 MED ORDER — ACETAMINOPHEN 325 MG RE SUPP
650.0000 mg | Freq: Four times a day (QID) | RECTAL | Status: DC | PRN
Start: 2022-04-05 — End: 2022-04-08

## 2022-04-05 MED ORDER — ASPIRIN 81 MG PO TBEC
81.0000 mg | DELAYED_RELEASE_TABLET | Freq: Every day | ORAL | Status: DC
Start: 2022-04-06 — End: 2022-04-08
  Administered 2022-04-06 – 2022-04-08 (×3): 81 mg via ORAL
  Filled 2022-04-05 (×4): qty 1

## 2022-04-05 MED ORDER — LIDOCAINE 5 % EX PTCH
2.0000 | MEDICATED_PATCH | Freq: Every day | CUTANEOUS | Status: DC | PRN
Start: 2022-04-05 — End: 2022-04-08

## 2022-04-05 MED ORDER — TAMSULOSIN HCL 0.4 MG PO CAPS
0.4000 mg | ORAL_CAPSULE | Freq: Every day | ORAL | Status: DC
Start: 2022-04-05 — End: 2022-04-07
  Filled 2022-04-05 (×3): qty 1

## 2022-04-05 MED ORDER — GLUCOSE 40 % PO GEL (WRAP)
15.0000 g | ORAL | Status: DC | PRN
Start: 2022-04-05 — End: 2022-04-08

## 2022-04-05 MED ORDER — ATORVASTATIN CALCIUM 80 MG PO TABS
80.0000 mg | ORAL_TABLET | Freq: Every evening | ORAL | Status: DC
Start: 2022-04-05 — End: 2022-04-08
  Administered 2022-04-06 – 2022-04-07 (×2): 80 mg via ORAL
  Filled 2022-04-05 (×4): qty 1

## 2022-04-05 MED ORDER — DEXTROSE 10 % IV BOLUS
12.5000 g | INTRAVENOUS | Status: DC | PRN
Start: 2022-04-05 — End: 2022-04-08

## 2022-04-05 MED ORDER — DEXTROSE 50 % IV SOLN
12.5000 g | INTRAVENOUS | Status: DC | PRN
Start: 2022-04-05 — End: 2022-04-08

## 2022-04-05 MED ORDER — POTASSIUM CHLORIDE 10 MEQ/100ML IV SOLN
10.0000 meq | INTRAVENOUS | Status: DC | PRN
Start: 2022-04-05 — End: 2022-04-08

## 2022-04-05 MED ORDER — ONDANSETRON 4 MG PO TBDP
4.0000 mg | ORAL_TABLET | Freq: Four times a day (QID) | ORAL | Status: DC | PRN
Start: 2022-04-05 — End: 2022-04-08

## 2022-04-05 MED ORDER — MAGNESIUM SULFATE IN D5W 1-5 GM/100ML-% IV SOLN
1.0000 g | INTRAVENOUS | Status: DC | PRN
Start: 2022-04-05 — End: 2022-04-08

## 2022-04-05 MED ORDER — ACETAMINOPHEN 500 MG PO TABS
1000.0000 mg | ORAL_TABLET | Freq: Once | ORAL | Status: AC
Start: 2022-04-05 — End: 2022-04-05
  Administered 2022-04-05: 1000 mg via ORAL
  Filled 2022-04-05: qty 2

## 2022-04-05 MED ORDER — VANCOMYCIN HCL IN NACL 2-0.9 GM/500ML-% IV SOLN
20.0000 mg/kg | Freq: Once | INTRAVENOUS | Status: AC
Start: 2022-04-05 — End: 2022-04-05
  Administered 2022-04-05: 2000 mg via INTRAVENOUS
  Filled 2022-04-05: qty 500

## 2022-04-05 MED ORDER — VANCOMYCIN HCL IN NACL 1-0.9 GM/250ML-% IV SOLN
1000.0000 mg | Freq: Two times a day (BID) | INTRAVENOUS | Status: DC
Start: 2022-04-06 — End: 2022-04-06
  Administered 2022-04-06: 1000 mg via INTRAVENOUS
  Filled 2022-04-05: qty 250
  Filled 2022-04-05: qty 1000

## 2022-04-05 MED ORDER — SENNOSIDES-DOCUSATE SODIUM 8.6-50 MG PO TABS
1.0000 | ORAL_TABLET | Freq: Two times a day (BID) | ORAL | Status: DC | PRN
Start: 2022-04-05 — End: 2022-04-08

## 2022-04-05 NOTE — ED Provider Notes (Signed)
[facility] EMERGENCY DEPARTMENT  ATTENDING PHYSICIAN HISTORY AND PHYSICAL EXAM     Patient Name: [name] [name]  Department:[facility] EMERGENCY DEPT  Encounter Date:  04/05/2022  Attending Physician: Tiajuana Amass, MD   Age: 71 y.o. male  Patient Room: S 12/S 12  PCP: Marisa Sprinkles, MD           Diagnosis/Disposition:     Final diagnoses:   Fever in adult   Mesothelioma       ED Disposition       ED Disposition   Admit    Condition   --    Date/Time   Mon Apr 05, 2022  4:52 PM    Comment   Admitting Physician: Wynona Canes [[id number]]   Service:: Medicine [[id number]]   Estimated Length of Stay: > or = to 2 midnights   Tentative Discharge Plan?: Home or Self Care [1]   Does patient need telemetry?: No                 Follow-Up Providers (if applicable)    No follow-up provider specified.     New Prescriptions    No medications on file             Medical Decision Making:       ED Course as of 04/05/22 1652   Mon Apr 05, 2022   1514 Differential diagnosis includes pneumonia, UTI, sepsis, septic shock, progression, PE, ACS.  Patient presenting with fatigue, fever. No localizing sx such as cp, sob, abd pain, dysuria. Recently hospitalized for UTI due to E faecalis.  Patient here febrile, appears fatigued and lethargic but will answer questions and is mentating well.  On recent baseline 2L NC satting mid 90s. Sent here by his oncologist for sepsis evaluation.  Per notes may be some plan to proceed to hospice in the near future. Sepsis workup, broad spectrum abx ordered.  [AZ]   1515 XR Chest  AP Portable  IMPRESSION:    Stable right-sided effusion and opacities, [AZ]   1517 I reviewed patient's urine culture, E faecalis susceptible to vanc and penicillins [AZ]   1535 Lactic Acid: 0.7 [AZ]   1556 WBC: 7.21 [AZ]   1556 Hemoglobin(!): 8.5  Similar to prior [AZ]   1622 No clear source of infection found on workup today (UA still pending). Lactic and WBC wnl, but given febrile cancer patient on treatment think he needs admission to r/o occult  sepsis. Covered with vanc/zosyn, cultures sent. His oncologist mentioned possible conversion to hospice, I discussed this with him and wife at bedside. This is something they would consider, but would like admission today for abx to see if that helps with his sx. He's DNR/DNI per oncologist code discussion. Hospitalist messaged for admission   [AZ]   1638 SARS CoV 2 Overall Result: Not Detected [AZ]   1652 I discussed case with hospitalist, admitted to medicine [AZ]      ED Course User Index  [AZ] [name] Clos Loleta Rose, MD       Medical Decision Making  Amount and/or Complexity of Data Reviewed  Labs: ordered. Decision-making details documented in ED Course.  Radiology:  Decision-making details documented in ED Course.  ECG/medicine tests: ordered.    Risk  Decision regarding hospitalization.                              History of Presenting Illness:     Nursing Triage note: Pt.  BIBA from [facility] being sent by Oncologist to be evaluated for possible sepsis.  H/o epithelioid mesothelioma receiving ipilimumab and nivolumab with last txn 3 weeks ago and several pleural effusions.  Pt. on 2L O2 at baseline.  Pt. endorses generalized weakness.  Denies fever chills, N/V/D.  Ill appearing in triage, diaphoretic, endorsing chills.  A&Ox4, maintaining SpO2 of 96% on 2L.  Chief complaint: Generalized weakness    [name] [name] is a 71 y.o. male w/ PMH of mesothelioma (on immunotherapy every 3 weeks) and hyperlipidemia presents to the ED after referred by oncologist when he presented for immunotherapy today. He endorses increased fatigue (starting 4 days ago), cough and bilateral hip pain. He attributes hip pain to being in bed for 2 weeks.     He was discharged from [facility] 7 days ago with a UTI (E. Faecalis) on Keflex. He is on 2L O2 at home.     Denies abdominal pain or dysuria.         Review of Systems:  Physical Exam:     Review of Systems    Positive and negative ROS per above and in HPI. All other systems  reviewed and negative.     Pulse 81  BP 103/60  Resp 22  SpO2 96 %  Temp (!) 101 F (38.3 C)     Physical Exam  Nursing notes and vital signs reviewed    General: Patient in no acute distress, overall appears fatigued but answering questions appropriately  Head: Normocephalic, atraumatic  Neck: supple  Eyes: No scleral icterus  Ears/Nose/Throat: Moist mucous membranes, airway is patent, no facial swelling  Cardiovascular: Regular rate and rhythm with no murmurs/rubs appreciated, no LE swelling  Pulmonary: Normal respiratory effort, CTAB w/o wheezes/crackles  GI: Abdomen is soft, non-distended, and non-tender.  Peritoneal signs are absent.  Back: No gross deformities.  Musculoskeletal: No gross injuries or deformities.  Neurological: Alert and oriented x3, face symmetric, hearing intact to voice, speech normal, no gross deficits. Moving all extremities spontaneously.   Psych: Behaving appropriately  Skin: Warm, dry, no rashes           Interpretations, Clinical Decision Tools and Critical Care:     O2 Sat:  The patient's oxygen saturation was 96 % on 2L NC. This was independently interpreted by me as Hypoxic.   EKG: I reviewed and Independently interpreted the patient's EKG as Normal sinus at 81. No STT changes. No STEMI.   Cardiac Monitoring: I independely reviewed and interpreted the patient's rhythm strip as normal sinus at 81.               Procedures:   Procedures      Attestations:     Scribe Attestation: I am the first provider for this patient and I personally performed the [name] documented. Delton See is scribing for me on this chart. This note and the patient instructions accurately reflect work and decisions made by me.      Documentation Notes:  Parts of this note were generated by the Epic EMR system/ Dragon speech recognition and may contain inherent errors or omissions not intended by the user. Grammatical errors, random word insertions, deletions, pronoun errors and incomplete sentences are  occasional consequences of this technology due to software limitations. Not all errors are caught or corrected.  My documentation is often completed after the patient is no longer under my clinical care. In some cases, the Epic EMR may pull updated results into the above documentation  which may not reflect all results or information that were available to me at the time of my medical decision making.   If there are questions or concerns about the content of this note or information contained within the body of this dictation they should be addressed directly with the author for clarification.                 Tiajuana Amass, MD  04/05/22 [phone]

## 2022-04-05 NOTE — H&P (Addendum)
Admission History & Physical    Date Time: 04/05/2022 5:08 PM  Patient Name: [name] [name],[name] [name]  MRN: [id number]    CC:     Chief Complaint   Patient presents with    Generalized weakness     Assessment & Plan:   66M h/o Mesothelioma, Former Smoker, Chronic Arthritis, BPH, HLD referred from [place] for Fever in setting of [facility] concerning for Sepsis vs disease progression.    SIRS  Immunocompromised  -start empiric Cefepime + Vanco given recent hospitalization  -f/u Blood and urine Cx from ER  -query viral panel -- recently on Keflex for UTI -- interestingly no wbc or lactic    Chronic Respiratory Failure  Mesothelioma  Anemia, chronic  Generalized Weakness  -dx 2022, currently receiving immunotherapy  -followed by [facility] (Dr Jeannine KittenBenyones -- refer to separate note)  -home hospice being considered given poor performance status -- info visit ordered    Cancer related Pain  Chronic Arthritis  -continue home Oxy + Lyrica    Lab Results   Component Value Date    TSH 1.07 03/15/2022       Patient has BMI=Body mass index is 32.23 kg/m.  Diagnosis: Obesity based on BMI criteria       Recent Labs     04/05/22  1522 04/05/22  1156   Sodium 132* 133*     Diagnosis: Mild Hyponatremia       Code Status: NO CPR  -  ALLOW NATURAL DEATH    Complex Medical Decision Making   -IV antibiotics pending further culture data    History of Present Illness:   Patient referred from [place] for elevated fever with concern for Sepsis.  [facility] for UTI for which he is still taking Keflex.  Reports worsening fatigue in addition to exacerbation of chronic hip and back pain.  Generally uses 2L O2 -- currently usign 4L.    [facility] -- receiving Oxycodon and Lyrica.  Did not find Marinol to be helpful, also not covered by insuraance.    Wife at bedside very supportive.    Review of Systems:   Constitutional: denies fevers  HEENT: +nausea  Pulmonary: +easy fatigability  Gastrointestinal:  denies abdominal pain  Musculoskeletal: +chronic back and hip pain  Dermatologic: denies rashes  Neurologic: +generalized weakness  All other systems were reviewed and are negative unless stated above per HPI.    Physical Exam:   Temp:  [98.4 F (36.9 C)-101 F (38.3 C)] 98.4 F (36.9 C)  Heart Rate:  [78-93] 78  Resp Rate:  [19-22] 19  BP: (95-105)/(55-66) 95/55  General: alert and interactive, appears frail  HEENT: atraumatic, anicteric sclera, clear phonation  Thorax: symmetric chest rise  Cardiovascular: RRR  Pulmonary: diminished breath sounds  Abdomen: without guarding  Musculoskeletal: sarcopenia  Integumentary: warm, flushed  Neurologic: conversant, follows commands  Psychiatric: calm, cooperative    Past Medical History:     Past Medical History:   Diagnosis Date    Arthritis     Hyperlipidemia     Lung anomaly     Malignant neoplasm      Past Surgical History:   Procedure Laterality Date    CHOLECYSTECTOMY       Family History:     Family History   Problem Relation Age of Onset    Dementia Mother     Angina Father     Heart attack Father      Social  History:     Social History     Tobacco Use   Smoking Status Former    Packs/day: 1.50    Years: 20.00    Total pack years: 30.00    Types: Cigarettes, Cigars    Quit date: 09/06/1988    Years since quitting: 33.6   Smokeless Tobacco Never     Social History     Substance and Sexual Activity   Alcohol Use Yes    Alcohol/week: 3.0 standard drinks of alcohol    Types: 3 Shots of liquor per week     Social History     Substance and Sexual Activity   Drug Use Yes    Frequency: 4.0 times per week    Types: Marijuana     Allergies:   No Known Allergies  Home Medications:     Home Medications               acetaminophen (TYLENOL) 500 MG tablet     Take 2 tablets (1,000 mg) by mouth every 6 (six) hours as needed     aspirin 81 MG EC tablet     Take 1 tablet (81 mg) by mouth daily     atorvastatin (LIPITOR) 80 MG tablet     Take 1 tablet (80 mg) by mouth daily      cephALEXin (KEFLEX) 500 MG capsule     Take 1 capsule (500 mg) by mouth 4 (four) times daily for 7 days     dronabinol (MARINOL) 2.5 MG capsule     Take 1 capsule (2.5 mg) by mouth 2 (two) times daily     glucosamine-chondroitin 500-400 MG tablet     Take 1 tablet by mouth daily     L-Lysine 1000 MG Tab     Take 1-2 tablets (1,000-2,000 mg) by mouth     lidocaine-prilocaine (EMLA) cream     Apply to the Port-A-Cath site 30-60-minute before chemotherapy.     ondansetron (ZOFRAN) 8 MG tablet     Take 1 tablet (8 mg) by mouth every 8 hours as needed for nausea and/or vomiting.     Oxymetazoline HCl (Nasal Spray) 0.05 % Solution     1 spray by Nasal route     prochlorperazine (COMPAZINE) 10 MG tablet          tamsulosin (FLOMAX) 0.4 MG Cap     Take 1 capsule (0.4 mg) by mouth nightly     terazosin (HYTRIN) 10 MG capsule     Take 1 capsule (10 mg) by mouth daily     UNABLE TO FIND     Mushroom super blend (immunity and brain supplement) daily          Flagged for Removal               albuterol sulfate HFA (PROVENTIL) 108 (90 Base) MCG/ACT inhaler               Labs:     Recent Labs     04/05/22  1522   WBC 7.21   Hgb 8.5*   Platelets 334     Recent Labs     04/05/22  1522   Sodium 132*   Potassium 3.9   Chloride 98*   CO2 26   Calcium 9.0   Creatinine 0.8   Anion Gap 8.0     Recent Labs     04/05/22  1522   Bilirubin, Total 0.7   ALT 14   AST (  SGOT) 19   Alkaline Phosphatase 68   Albumin 2.6*     Lab Results   Component Value Date    INR 1.3 (H) 03/29/2022     Imaging:   Reviewed to date    Wynona Canes, MD  PRN Hospitalist  [facility]

## 2022-04-05 NOTE — ED Notes (Signed)
This RN attempting to obtain labs and blood cx from patient but patient has been transported to xray for CXR.

## 2022-04-05 NOTE — Progress Notes (Signed)
[name] [name] is a 71 year old patient of Dr. Fernanda Drum being treated for Mesothelioma. Patient in [facility] for Cycle 3, Day 1: Nivolumab/Ipilimumab    Patient arrived via wheelchair, accompanied by family member. Patient arrived with portable oxygen on 2 Liters NC. Patient connected to wall O2. Patient was admitted at [facility] 03/28/22 - 03/30/22 for generalized weakness and progressive shortness of breath.     Temp upon arrival was 100.34F (37.9C). Upon re-check temp was 100.2F (37.2C). Per family member, patient has been sleeping approximately 22 hours per day and believe that fever began on Saturday 7/29. Patient reports low back pain and minimal PO fluid intake today. Drank some milk and half a can of coke on way to infusion center. Na=133 on today's CMP.     Dr. Fernanda Drum ordered to begin IV fluids and met with patient in infusion center. Patient ordered to proceed to [facility] ED for infectious workup.   - 1320: 911 called by Infusion Nurse Practitioner Rodney Cruise. Patient received approximately 362 mL of Normal Saline IV. Right chest mediport flushed with 20 mL NS and capped.   - 1334: EMS arrived  - 1341: Patient left infusion center via stretcher via EMS en route to [facility] ED.     Clinton Sawyer, RN, OCN

## 2022-04-05 NOTE — ED Notes (Signed)
Report called to Carol RN

## 2022-04-05 NOTE — Plan of Care (Signed)
[name] [name] was recently hospitalized for urinary tract infection due to Enterococcus faecalis discharged on Keflex.  He presents to infusion clinic today febrile without respiratory symptoms or chest wall symptoms could be attributed to mesothelioma..  Referred him to the emergency room to rule out sepsis.  Reviewed CODE STATUS with him and in the case this is a do to advanced malignancy, I would recommend home hospice after rule out of sepsis.  He would like to be DNR/DNI.

## 2022-04-05 NOTE — Progress Notes (Signed)
Pharmacy to Dose Consult: Vancomycin    [name] [name] is a 71 year-old male brought in from [facility], referred by oncologist to be evaluated for possible sepsis.  Patient with history of epithelioid mesothelioma receiving ipilimumab and nivolumab with last treatment 3 weeks ago and history of several pleural effusions. Patient has been started on broad spectrum antibiotics.     Pharmacy consulted to assist with management of vancomycin therapy regimen.     Vancomycin Day #1    Indication: sepsis  Loading Dose: 2000 mg IV x1 dose (given 7/31 @ 15:53 in ED)  Maintenance Dose: 1000 mg IV q12h   Goal Trough: 15-20 mg/L (AUC 400-600 mg/Lhr)   Level due: steady state  Other anti-infectives: cefepime     Age: 71 y.o.  Recent Labs     04/05/22  1522 04/05/22  1200 04/05/22  1156   WBC 7.21 7.45  --    BUN 12.0  --  13.0   Creatinine 0.8  --  0.9   Lactic Acid 0.7  --   --      Wt Readings from Last 1 Encounters:   04/05/22 96.2 kg (212 lb)     Estimated Creatinine Clearance: 93.9 mL/min (based on SCr of 0.8 mg/dL).  Is renal function stable? Yes (baseline Scr ~0.8-0.9 mL/dL)  Renal Replacement Therapy? No    Imaging:    XR Chest  AP Portable 04/05/2022    Narrative  HISTORY: Fever.    COMPARISON: 03/30/22.    FINDINGS:  Heart size and mediastinum are normal. Right-sided interstitial prominence,  fluid and consolidation are unchanged with no pneumothorax or new airspace  disease.    Impression  Stable right-sided effusion and opacities      Cultures:   -Blood cultures pending  -Respiratory pathogen panel pending  -UA with reflex culture pending     Plan/Recommendations:     - Initiate vancomycin 1000 mg IV q12h. Predict approximate trough 14.9 mg/L and AUC 458 mg/Lhr based on Bayesian pharmacokinetic estimations.   - Obtain vancomycin level at steady state if continued (needs ordered).  - Please consider deescalating as able pending culture data and clinical status. We will continue to monitor renal function,  cultures, levels and adjust dose as necessary through continuation of therapy.     Thank you for consulting pharmacy. Please contact unit pharmacist with any questions/concerns.     Thank you,  Winfred Burn, PharmD  Clinical Pharmacist, Emergency Medicine       Reference: Vancomycin Dose Guide for Adults

## 2022-04-05 NOTE — Progress Notes (Signed)
Medical Oncology    Date: 04/05/2022  Patient Name: [name],[name]  Physician: Judieth Keens, MD,   Chief complaint: Epithelioid mesothelioma    History of Present Illness:   [name] [name] is a 71 y.o. 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  His oncological history is as follows:  01/13/2021: Sought medical attention for cough.  CT of the chest showed large right-sided pleural effusion with compressive atelectasis of the right lung with mediastinal shift of contents to the left.  02/05/2021: PET/CT showed rind of hypermetabolic pleural thickening within the right hemithorax for example in the right middle lobe measuring 10 mm with SUV of 9.  Large right pleural effusion with passive atelectasis.  No evidence of distant metastases.  02/27/2021: Underwent right VATS pleural biopsy and talc pleurodesis.  Pathology showed tumor characterized by invasive sheets of epithelioid cells with malignant features in a fibrotic stroma.  Morphology and immunophenotype consistent with epithelioid mesothelioma.  03/23/2021: Initiated carboplatin pemetrexed and bevacizumab.  Following 6 cycles of carboplatin pemetrexed he was maintained on bevacizumab maintenance.  05/21/2021: CT of the chest showed numerous pleural nodules.  No mediastinal or hilar adenopathy.  Emphysematous changes.  09/06/2021: CT of the chest showed pleural studding progression.  10/08/2021: Initiated the second line treatment with ipilimumab and nivolumab.  12/21/2021: Transferred care to [facility].   03/12/2022: PET/CT showed increased nodularity in hypoinflated right lung, enlarging and increase in FDG uptake in mediastinal lymph nodes.  Increased FDG uptake in the retroperitoneal and root of mesentery lymph nodes.  7/23 - 03/30/2022: Admitted to [facility] diagnosed with Enterococcus faecalis urinary tract infection.  Discharged on cephalexin    Interval history   [name] [name] reports ongoing fatigue and somnolence.  He denies any  shortness of breath or chest wall pain.  He is febrile today.      Past Medical History:     Past Medical History:   Diagnosis Date    Arthritis     Hyperlipidemia     Lung anomaly     Malignant neoplasm        Past Surgical History:     Past Surgical History:   Procedure Laterality Date    CHOLECYSTECTOMY         Family History:     Family History   Problem Relation Age of Onset    Dementia Mother     Angina Father     Heart attack Father        Social History:     Social History     Socioeconomic History    Marital status: Married   Tobacco Use    Smoking status: Former     Packs/day: 1.50     Years: 20.00     Total pack years: 30.00     Types: Cigarettes, Cigars     Quit date: 09/06/1988     Years since quitting: 33.6    Smokeless tobacco: Never   Vaping Use    Vaping Use: Never used   Substance and Sexual Activity    Alcohol use: Yes     Alcohol/week: 3.0 standard drinks of alcohol     Types: 3 Shots of liquor per week    Drug use: Yes     Frequency: 4.0 times per week     Types: Marijuana    Sexual activity: Yes     Partners: Female     Comment: vasectomy     Social Determinants of Health  Financial Resource Strain: Low Risk  (01/24/2022)    Overall Financial Resource Strain (CARDIA)     Difficulty of Paying Living Expenses: Not hard at all   Food Insecurity: No Food Insecurity (01/24/2022)    Hunger Vital Sign     Worried About Running Out of Food in the Last Year: Never true     Ran Out of Food in the Last Year: Never true   Transportation Needs: No Transportation Needs (01/24/2022)    PRAPARE - Therapist, artTransportation     Lack of Transportation (Medical): No     Lack of Transportation (Non-Medical): No   Physical Activity: Inactive (01/24/2022)    Exercise Vital Sign     Days of Exercise per Week: 0 days     Minutes of Exercise per Session: 0 min   Stress: Stress Concern Present (01/24/2022)    [employer] - Occupational Stress Questionnaire     Feeling of Stress : To some extent   Social  Connections: Moderately Isolated (01/24/2022)    Social Connection and Isolation Panel [NHANES]     Frequency of Communication with Friends and Family: More than three times a week     Frequency of Social Gatherings with Friends and Family: More than three times a week     Attends Religious Services: Never     Database administratorActive Member of Clubs or Organizations: No     Attends BankerClub or Organization Meetings: Never     Marital Status: Married   Catering managerntimate Partner Violence: Not At Risk (01/24/2022)    Humiliation, Afraid, Rape, and Kick questionnaire     Fear of Current or Ex-Partner: No     Emotionally Abused: No     Physically Abused: No     Sexually Abused: No   Housing Stability: High Risk (01/24/2022)    Housing Stability Vital Sign     Unable to Pay for Housing in the Last Year: No     Number of Places Lived in the Last Year: 3     Unstable Housing in the Last Year: No       Allergies:   No Known Allergies    Medications:     No current facility-administered medications for this visit.     Current Outpatient Medications   Medication Sig Dispense Refill    acetaminophen (TYLENOL) 500 MG tablet Take 2 tablets (1,000 mg) by mouth every 6 (six) hours as needed      albuterol sulfate HFA (PROVENTIL) 108 (90 Base) MCG/ACT inhaler       aspirin 81 MG EC tablet Take 1 tablet (81 mg) by mouth daily      atorvastatin (LIPITOR) 80 MG tablet Take 1 tablet (80 mg) by mouth daily      cephALEXin (KEFLEX) 500 MG capsule Take 1 capsule (500 mg) by mouth 4 (four) times daily for 7 days 28 capsule 0    dronabinol (MARINOL) 2.5 MG capsule Take 1 capsule (2.5 mg) by mouth 2 (two) times daily 60 capsule 0    glucosamine-chondroitin 500-400 MG tablet Take 1 tablet by mouth daily      L-Lysine 1000 MG Tab Take 1-2 tablets (1,000-2,000 mg) by mouth      lidocaine-prilocaine (EMLA) cream Apply to the Port-A-Cath site 30-60-minute before chemotherapy.      ondansetron (ZOFRAN) 8 MG tablet Take 1 tablet (8 mg) by mouth every 8 hours as needed for nausea  and/or vomiting. 30 tablet 1    Oxymetazoline HCl (Nasal Spray)  0.05 % Solution 1 spray by Nasal route      prochlorperazine (COMPAZINE) 10 MG tablet       tamsulosin (FLOMAX) 0.4 MG Cap Take 1 capsule (0.4 mg) by mouth nightly 30 capsule 11    terazosin (HYTRIN) 10 MG capsule Take 1 capsule (10 mg) by mouth daily 90 capsule 0    UNABLE TO FIND Mushroom super blend (immunity and brain supplement) daily       Facility-Administered Medications Ordered in Other Visits   Medication Dose Route Frequency Provider Last Rate Last Admin    acetaminophen (TYLENOL) tablet 1,000 mg  1,000 mg Oral Once Elesa Hacker, MD        cefepime (MAXIPIME) 2 g in sterile water (preservative free) 10 mL IV push injection  2 g Intravenous Once Girma, Beletshachew, MD        sodium chloride 0.9 % bolus 500 mL  500 mL Intravenous Once [name] [name], [name] Rose, MD        vancomycin (VANCOCIN) 2000 mg in sodium chloride 0.9% 500 mL (premix)  20 mg/kg Intravenous Once Elesa Hacker, MD           Review of Systems:   All other systems reviewed and are negative.    Physical Exam:     Vitals:    04/05/22 1313   BP: 105/66   Pulse: 93   Temp: (!) 100.8 F (38.2 C)   SpO2: 95%       ECOG performance status: 2  Ill-appearing 71 year old male  Mood and judgment appropriate  Somnolent but awakens to verbal stimuli  Abdomen soft nondistended nontender  Laboratory:     Lab Results   Component Value Date    WBC 7.45 04/05/2022    RBC 3.06 (L) 04/05/2022    HGB 8.8 (L) 04/05/2022    HCT 27.1 (L) 04/05/2022    MCV 88.6 04/05/2022    MCH 28.8 04/05/2022    MCHC 32.5 04/05/2022    RDW 15 04/05/2022    PLT 312 04/05/2022    MPV 9.3 04/05/2022       Lab Results   Component Value Date    NEUTRO 64.3 04/05/2022     No results found for: "RETICCTPCT"    Lab Results   Component Value Date    IRON 26 (L) 03/29/2022    FERRITIN 129.30 03/29/2022    TIBC 201 (L) 03/29/2022       Lab Results   Component Value Date    FOLATE 11.5 03/29/2022       Lab Results    Component Value Date    HAPTOGLOBIN 323 (H) 03/29/2022    LDH 243 03/29/2022       Lab Results   Component Value Date    GLU 160 (H) 04/05/2022    BUN 13.0 04/05/2022    EGFR >60.0 04/05/2022    NA 133 (L) 04/05/2022    K 3.9 04/05/2022    CL 97 (L) 04/05/2022    CO2 29 04/05/2022    PROT 7.1 04/05/2022    ALB 2.7 (L) 04/05/2022    GLOB 4.4 (H) 04/05/2022    ALKPHOS 67 04/05/2022    AST 20 04/05/2022    ALT 13 04/05/2022         Radiology:   Per HPI    Pathology:   Per HPI    Assessment & Plan:   [name] [name] is a 71 y.o. 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  [name] [name] is febrile today and along with a recent diagnosis of Enterococcus faecalis urinary tract infection treated with cephalexin; I am concerned about the progressive infection/sepsis.  I referred him to the emergency room for admission and further evaluation.  If this episode is related to advanced malignancy; I recommended a home hospice.  He is in agreement.  But would want to rule out sepsis first.  Also discussed CODE STATUS and he would like to be DNR/DNI.      # Plan:  Emergency room referral    #Followup:   To be determined      Judieth Keens, MD  [facility] Medical Oncology

## 2022-04-05 NOTE — Progress Notes (Signed)
[name] [name] is a 71 y.o. patient of Judieth Keens, MD        Patient presents for port access for infusion.   Mediport accessed using 20 g 3/4 inch huber needle x1 attempt. Positive blood return noted. Labs drawn per order. Mediport flushed with 10 ml normal saline and left accessed. Dressing c/d/i, initialed, and dated. Curos cap applied on end. Patient was discharged from lab in no apparent distress.      Zonia Kief, RN  04/05/2022 12:01 PM

## 2022-04-05 NOTE — ED to IP RN Note (Signed)
[facility] HOSPITAL EMERGENCY DEPT  ED NURSING NOTE FOR THE RECEIVING INPATIENT NURSE   ED NURSE Jarold Motto [phone]   ED CHARGE RN Marisue Ivan   ADMISSION INFORMATION   [name] [name] is a 71 y.o. male admitted with an ED diagnosis of:    1. Mesothelioma    2. Fever in adult         Isolation: None   Allergies: Patient has no known allergies.   Holding Orders confirmed? N/A   Belongings Documented? Yes   Home medications sent to pharmacy confirmed? N/A   NURSING CARE   Patient Comes From:   Mental Status: Home/Family Care  alert and oriented   ADL: Needs assistance with ADLs   Ambulation: mild difficulty   Pertinent Information  and Safety Concerns:     Broset Violence Risk Level: Low 4L nc, usually on 2L @ baseline. Has port accessed for immunotherapy do not use.  18G in right ac, 22G in right hand.      CT / NIH   CT Head ordered on this patient?  No   NIH/Dysphagia assessment done prior to admission? No   VITAL SIGNS (at the time of this note)      Vitals:    04/05/22 1629   BP: 95/55   Pulse: 78   Resp: 19   Temp:    SpO2: 95%

## 2022-04-05 NOTE — ED Notes (Signed)
This patient was seen by me in triage and initial testing was ordered based on presenting complaint. Care was expedited. I am not the primary physician for this patient.       Elesa Hacker, MD  04/05/22 1419

## 2022-04-06 LAB — URINALYSIS REFLEX TO MICROSCOPIC EXAM - REFLEX TO CULTURE
Bilirubin, UA: NEGATIVE
Blood, UA: NEGATIVE
Glucose, UA: NEGATIVE
Ketones UA: NEGATIVE
Leukocyte Esterase, UA: NEGATIVE
Nitrite, UA: NEGATIVE
Specific Gravity UA: 1.021 (ref 1.001–1.035)
Urine pH: 6 (ref 5.0–8.0)
Urobilinogen, UA: NORMAL mg/dL (ref 0.2–2.0)

## 2022-04-06 LAB — CBC
Absolute NRBC: 0 10*3/uL (ref 0.00–0.00)
Hematocrit: 27.9 % — ABNORMAL LOW (ref 37.6–49.6)
Hgb: 8.7 g/dL — ABNORMAL LOW (ref 12.5–17.1)
MCH: 28 pg (ref 25.1–33.5)
MCHC: 31.2 g/dL — ABNORMAL LOW (ref 31.5–35.8)
MCV: 89.7 fL (ref 78.0–96.0)
MPV: 9.9 fL (ref 8.9–12.5)
Nucleated RBC: 0 /100 WBC (ref 0.0–0.0)
Platelets: 315 10*3/uL (ref 142–346)
RBC: 3.11 10*6/uL — ABNORMAL LOW (ref 4.20–5.90)
RDW: 15 % (ref 11–15)
WBC: 6.05 10*3/uL (ref 3.10–9.50)

## 2022-04-06 LAB — BASIC METABOLIC PANEL
Anion Gap: 9 (ref 5.0–15.0)
BUN: 10 mg/dL (ref 9.0–28.0)
CO2: 23 mEq/L (ref 17–29)
Calcium: 9.1 mg/dL (ref 7.9–10.2)
Chloride: 103 mEq/L (ref 99–111)
Creatinine: 0.7 mg/dL (ref 0.5–1.5)
Glucose: 92 mg/dL (ref 70–100)
Potassium: 4.3 mEq/L (ref 3.5–5.3)
Sodium: 135 mEq/L (ref 135–145)
eGFR: >60 mL/min/{1.73_m2} (ref >=60)

## 2022-04-06 LAB — RESPIRATORY PATHOGEN PANEL WITH COVID-19,PCR
Adenovirus: NOT DETECTED
Bordetella parapertussis PCR: NOT DETECTED
Bordetella pertussis: NOT DETECTED
Chlamydophila pneumoniae: NOT DETECTED
Coronavirus 229E: NOT DETECTED
Coronavirus HKU1: NOT DETECTED
Coronavirus NL63: NOT DETECTED
Coronavirus OC43: NOT DETECTED
Human Metapneumovirus: NOT DETECTED
Human Rhinovirus/Enterovirus: NOT DETECTED
Influenza A: NOT DETECTED
Influenza B: NOT DETECTED
Mycoplasma pneumoniae: NOT DETECTED
Parainfluenza Virus 1: NOT DETECTED
Parainfluenza Virus 2: NOT DETECTED
Parainfluenza Virus 3: NOT DETECTED
Parainfluenza Virus 4: NOT DETECTED
Respiratory Syncytial Virus: NOT DETECTED
SARS CoV-2 RNA: NOT DETECTED

## 2022-04-06 LAB — ECG 12-LEAD
Atrial Rate: 81 {beats}/min
IHS MUSE NARRATIVE AND IMPRESSION: NORMAL
QRS Duration: 90 ms
R Axis: 18 degrees

## 2022-04-06 MED ORDER — AMOXICILLIN 500 MG PO CAPS
500.0000 mg | ORAL_CAPSULE | Freq: Three times a day (TID) | ORAL | Status: DC
Start: 2022-04-06 — End: 2022-04-08
  Administered 2022-04-06 – 2022-04-08 (×7): 500 mg via ORAL
  Filled 2022-04-06 (×7): qty 1

## 2022-04-06 NOTE — Plan of Care (Signed)
Problem: Inadequate Cardiac Output  Goal: Adequate tissue perfusion will be maintained  Outcome: Progressing  Flowsheets (Taken 04/06/2022 0131)  Adequate tissue perfusion will be maintained: Monitor/assess vital signs     Problem: Infection  Goal: Free from infection  Outcome: Progressing  Flowsheets (Taken 04/06/2022 0131)  Free from infection: Assess for signs/symptoms of infection     Problem: Inadequate Gas Exchange  Goal: Adequate oxygenation and improved ventilation  Outcome: Progressing  Flowsheets (Taken 04/06/2022 0131)  Adequate oxygenation and improved ventilation:   Assess lung sounds   Monitor SpO2 and treat as needed     Problem: Compromised skin integrity  Goal: Skin integrity is maintained or improved  Outcome: Progressing  Flowsheets (Taken 04/06/2022 0131)  Skin integrity is maintained or improved:   Assess Braden Scale every shift   Turn or reposition patient every 2 hours or as needed unless able to reposition self     Problem: Nutrition  Goal: Nutritional intake is adequate  Outcome: Progressing  Flowsheets (Taken 04/06/2022 0131)  Nutritional intake is adequate: Assist patient with meals/food selection    Pt A&Ox4. 2-3L via nasal cannula. Denies pain or discomfort. Afebrile. Continues on scheduled IV antibiotics. Up with one person assist. Bed alarm on. Call bell within reach. Will continue to monitor

## 2022-04-06 NOTE — UM Notes (Signed)
Adult Admit to Inpatient ([facility] Only) (Order #[id number]) on 04/05/22    PATIENT NAME: [name] [name],[name] [name]   DOB: [birth year]     Inpatient Day 1: 04/05/22  Unit: Med-Surg    Reason for Admission:   63M h/o Mesothelioma, Former Smoker, Chronic Arthritis, BPH, HLD referred from [place] for Fever in setting of recent UTI at [facility] concerning for Sepsis vs disease progression.    Patient referred from [place] for elevated fever with concern for Sepsis.  Recently discharged from [facility] for UTI for which he is still taking Keflex.  Reports worsening fatigue in addition to exacerbation of chronic hip and back pain.  Generally uses 2L O2 -- currently usign 4L.    Vital Signs:  Temp: 97.3-101  HR: 68-93  RR: 17-23  BP: 95/55-105/56  Sat: 95% on 3L/nc    Medications:  Scheduled Meds:  Current Facility-Administered Medications   Medication Dose Route Frequency    amoxicillin  500 mg Oral Q8H SCH    aspirin EC  81 mg Oral Daily    atorvastatin  80 mg Oral QHS    enoxaparin  40 mg Subcutaneous Daily    pregabalin  25 mg Oral TID    tamsulosin  0.4 mg Oral Daily after dinner    terazosin  10 mg Oral QHS     Continuous Infusions:  PRN Meds:.acetaminophen **OR** acetaminophen, dextrose **OR** dextrose **OR** dextrose **OR** glucagon (rDNA), lidocaine, magnesium sulfate, melatonin, naloxone, nicotine, ondansetron **OR** ondansetron, oxyCODONE, polyethylene glycol, potassium & sodium phosphates **AND** sodium phosphates 20 mmol in dextrose 5 % 250 mL IVPB, potassium chloride **AND** potassium chloride, prochlorperazine, senna-docusate    Labs:   04/05/22  1522   WBC 7.21   Hgb 8.5*   Platelets 334        04/05/22  1522   Sodium 132*   Potassium 3.9   Chloride 98*   CO2 26   Calcium 9.0   Creatinine 0.8   Anion Gap 8.0        04/05/22  1522   Bilirubin, Total 0.7   ALT 14   AST (SGOT) 19   Alkaline Phosphatase 68   Albumin 2.6*     Plan:  SIRS  Immunocompromised  -start empiric Cefepime + Vanco given recent hospitalization  -f/u Blood and urine  Cx from ER    Chronic Respiratory Failure  Mesothelioma  Anemia, chronic  Generalized Weakness  -dx 2022, currently receiving immunotherapy  -followed by [facility] (Dr Jeannine Kitten -- refer to separate note)  -home hospice being considered given poor performance status -- info visit ordered     Cancer related Pain  Chronic Arthritis  -continue home Oxy + Lyrica                     UTILIZATION REVIEW CONTACT:   Huntley Estelle RN, BSN  Utilization Review   [facility]  [address]  [address]  [place], [place] [address]  [phone] (Voice Mail Only)  [phone]) [phone]  Email: [email or web address]           NOTES TO REVIEWER:    This clinical review is based on/compiled from documentation provided by the treatment team within the patient's medical record.

## 2022-04-06 NOTE — PT Eval Note (Signed)
Physical Therapy Evaluation  [name] [name]      Post Acute Care Therapy Recommendations:     Discharge Recommendations:  Home with supervision, Home with home health PT  D/C Milestones: increased walking distance Anticipate achievement in 2 sessions    DME needs IF patient is discharging home: No additional equipment/DME recommended at this time (will continue to assess need for assistive device)    Therapy discharge recommendations may change with patient status.  Please refer to most recent note for up-to-date recommendations.    Assessment:   Significant Findings: +vomiting after session, RN aware    [name] [name] is a 71 y.o. male admitted 04/05/2022 for fever in setting of recent UTI/admission to [facility].  Patient received in bed, sleepy but able to arouse. Patient requiring sba to cgA overall for mobility. Walks short distance out of room with/without support from IV pole. Fatigues quickly with episode of vomiting after return to bed. Patient at baseline is independent with mobility/ADLs but reports limited mobility since recent [place] home from the hospital a few days ago. Patient has been using home O2 for the last week. Patient would benefit from skilled therapy for goals below.       Therapy Diagnosis: weakness, decreased ind with mobility    Rehabilitation Potential: good    Treatment Activities: PT eval, initiated gait training  Educated the patient to role of physical therapy, plan of care, goals of therapy and HEP, safety with mobility and ADLs, energy conservation techniques, home safety.    Plan:   PT Frequency: 3-4x/wk    Treatment/Interventions: bed mob, gait, transfers, HEP, energy conservation, stairs if able    Risks/benefits/POC discussed with patient      Unit: [facility]  Bed: Z610/R604.54     Precautions and Contraindications:   falls    Consult received for [name] [name] for PT Evaluation and Treatment.  Patient's medical condition is appropriate for Physical Therapy  intervention at this time.    History of Present Illness:   [name] [name] is a 71 y.o. male admitted on 04/05/2022 "referred from [place] for Fever in setting of recent UTI at [facility] concerning for Sepsis vs disease progression"-from H&P    Medical Diagnosis: Mesothelioma [C45.9]  Fever in adult [R50.9]    Past Medical/Surgical History:  Past Medical History:   Diagnosis Date    Arthritis     Hyperlipidemia     Lung anomaly     Malignant neoplasm      Past Surgical History:   Procedure Laterality Date    CHOLECYSTECTOMY         Imaging/Tests/Labs:  XR Chest  AP Portable  Result Date: 04/05/2022   Stable right-sided effusion and opacities, Bosie Helper, MD 04/05/2022 2:55 PM      Social History:   Prior Level of Function:  Prior level of function: Independent with ADLs, Ambulates independently  Baseline Activity Level: Community ambulation (but limited distances recently)  Driving: other(comment) (not recently since getting sick)  DME Currently at Home: ADL- Paediatric nurse, ADL- Grab Bars, Home O2 (Has been using O2 at home since recent [place] from [facility])    Home Living Arrangements:  Living Arrangements: Spouse/significant other, Family members  Type of Home: House  Home Layout: Other (Comment) (lives in basement apt of son's home, no stairs to enter)  DME Currently at Home: ADL- Paediatric nurse, ADL- Grab Bars, Home O2 (Has been using O2 at home since recent [place] from [facility])    Subjective: "I  am so cold, and I am so sleepy"   Patient is agreeable to participation in the therapy session. Nursing clears patient for therapy.     Patient Goal: none stated    Pain:   denies  Objective:   Patient is in bed with peripheral IV and O2 via NC at 3L in place.  Pt wore mask during therapy session:No      Cognitive Status and Neuro Exam:  Alert but reports being very sleepy;  oriented, follows commands    Musculoskeletal Examination  RUE ROM: wfl  LUE ROM: wfl  RLE ROM: limited hip IR 25%  LLE ROM: wfl    RUE Strength: wfl  LUE Strength:  wfl  RLE Strength: wfl  LLE Strength: wfl    Functional Mobility  Rolling: sba  Supine to Sit: cg  Scooting: cg  Sit to Stand: cg  Stand to Sit: cg  Transfers: cg        Ambulation  PMP - Progressive Mobility Protocol   PMP Activity: Step 7 - Walks out of Room  Distance Walked (ft) (Step 6,7): 50 Feet   Level of assistance required: cg no device, sba with IV pole  Pattern: slower pace, mildly unsteady  Device Used: IV pole or none  Weightbearing Status: FWB BLEs  Stair Management: NT  Number of Stairs: NT  Door Management: NT  Wheelchair Management: NT      Balance  Static Sitting: sup  Dynamic Sitting: sup  Static Standing: sba  Dynamic Standing: cg    Participation and Activity Tolerance  Participation Effort: good  Endurance: fair+              Patient left with call bell within reach, all needs met, SCDs off, fall mat in place, bed alarm on, chair alarm off and all questions answered. RN notified of session outcome and patient response.     Goals:  Goals  Goal Formulation: With patient  Time for Goal Acheivement: 3 visits  Pt Will Go Supine To Sit: independent  Pt Will Transfer Bed/Chair: with supervision  Pt Will Ambulate: with supervision, 101-150 feet (or LRD)  Pt Will Perform Home Exer Program: independent      PPE worn during session: procedural mask and gloves    Tech present: no  PPE worn by tech: N/A      [name] [name], PT  [phone]    Time of Treatment  PT Received On: 04/06/22  Start Time: 6045  Stop Time: 0855  Time Calculation (min): 30 min

## 2022-04-06 NOTE — Progress Notes (Addendum)
.  Case Management Progress Note:  SW  reached out to [place] [facility] to make arrangement to see patient to discuss future hospice plan with patient and patients family.      Chase Picket, MSW  Social Worker Case Manager  [facility]   Phone: [phone]  Fax: [phone]

## 2022-04-06 NOTE — Progress Notes (Signed)
[facility]  Adult Medicine Hospitalists           Progress Note    Assessment / Plan:   [name] [name] is a 71 y.o. male with a history of mesothelioma, former smoker, HLD, arthritis, HLD, sent from [place] due to fevers, found to have UTI.     Sepsis due to UTI  - present on admission, resolving  - growing e faecalis, not treated by abx, likely cause of persistent fevers  - changed abx to amox and stopped vanc/cefepime  - viral panel neg     Chronic respiratory failure secondary to mesothelioma  - on immunotherapy  - following with [place] for tx  - hospice consulted, to speak to patient and his wife tomorrow   - pain control with home meds    Subjective:   Subjective   Patient states he still feels tired. He denies urinary complaints at this time. Afebrile since yesterday afternoon.     Objective:   Objective   Patient Vitals for the past 24 hrs:   BP Temp Temp src Pulse Resp SpO2 Height Weight   04/06/22 1540 94/59 97.9 F (36.6 C) Oral 77 20 94 % -- --   04/06/22 1325 124/71 99.1 F (37.3 C) Oral 98 20 94 % -- --   04/06/22 1135 90/50 98.8 F (37.1 C) Oral (!) 101 (!) 24 95 % -- --   04/06/22 0736 106/61 97.7 F (36.5 C) Oral 79 18 95 % -- --   04/06/22 0407 97/59 97.3 F (36.3 C) Oral 68 20 95 % -- --   04/06/22 0042 96/53 97.7 F (36.5 C) Oral 75 (!) 23 97 % -- --   04/05/22 2102 -- -- -- -- -- -- 1.727 m (5\' 8" ) 97.9 kg (215 lb 13.3 oz)   04/05/22 2058 103/64 97.8 F (36.6 C) Oral 71 20 98 % -- --   04/05/22 1938 96/50 97.8 F (36.6 C) Oral 71 22 -- -- --   04/05/22 1719 96/51 98.6 F (37 C) Oral 72 17 -- -- --   04/05/22 1659 -- 98.4 F (36.9 C) Oral -- -- 96 % -- --   04/05/22 1629 95/55 -- -- 78 19 95 % -- --      Physical Exam  PE  A&Ox3, comfortable, no acute distress  No scleral icterus, no visible head trauma   RRR, no M/R/G  Lungs bilaterally clear to ascultation   BS+, no organomegaly appreciated, no tenderness on palpation  No peripheral edema    Chart Review:   The  following chart items were reviewed as of 3:59 PM on 04/06/22:  [x]  Lab Results [x]  Imaging Results   [x]  Problem List  [x]  Current Orders [x]  Current Medications  []  Allergies  []  Code Status []  Advanced Care Planning  []  SDoH          Signed by: Leanne Chang, MD  [facility] Adult [facility] Hospitalist  Epic chat me or page at [phone]  After Hours Pager:  [phone])

## 2022-04-06 NOTE — Consults (Addendum)
[facility] team received a call from [facility] [place] regarding a hospice info visit    Went to have a hospice visit w/ Mr. Vencill. Pt was resting & ask that I come back at 1pm as his wife should be in by then    Called pt's wife however VM was full. Bedside RN to send secure chat when pt's wife is at bedside.    Spoke w/ pt's wife- Meeting w/ Pt & wife between 10:30 & 11:30am tomorrow

## 2022-04-06 NOTE — Plan of Care (Signed)
Problem: Moderate/High Fall Risk Score >5  Goal: Patient will remain free of falls  Outcome: Progressing  Flowsheets (Taken 04/06/2022 0912)  Moderate Risk (6-13):   MOD-Apply bed exit alarm if patient is confused   MOD-Floor mat at bedside (where available) if appropriate   MOD-Consider activation of bed alarm if appropriate     Problem: Inadequate Cardiac Output  Goal: Adequate tissue perfusion will be maintained  Outcome: Progressing  Flowsheets (Taken 04/06/2022 0131 by Ancil Linsey, RN)  Adequate tissue perfusion will be maintained: Monitor/assess vital signs     Problem: Infection  Goal: Free from infection  Outcome: Progressing  Flowsheets (Taken 04/06/2022 0131 by Ancil Linsey, RN)  Free from infection: Assess for signs/symptoms of infection     Problem: Inadequate Gas Exchange  Goal: Adequate oxygenation and improved ventilation  Outcome: Progressing  Flowsheets (Taken 04/06/2022 0131 by Ancil Linsey, RN)  Adequate oxygenation and improved ventilation:   Assess lung sounds   Monitor SpO2 and treat as needed  Goal: Patent Airway maintained  Outcome: Progressing     Problem: Compromised skin integrity  Goal: Skin integrity is maintained or improved  Outcome: Progressing  Flowsheets (Taken 04/06/2022 1137)  Skin integrity is maintained or improved:   Turn or reposition patient every 2 hours or as needed unless able to reposition self   Assess Braden Scale every shift   Keep skin clean and dry     Problem: Nutrition  Goal: Nutritional intake is adequate  Outcome: Progressing  Flowsheets (Taken 04/06/2022 0131 by Ancil Linsey, RN)  Nutritional intake is adequate: Assist patient with meals/food selection

## 2022-04-06 NOTE — Consults (Signed)
Case Management Progress Note:  SW  reached out to [place] [facility] to make arrangement to see patient to discuss future hospice plan with patient and patients family.       SW went to visit patient who states wants to speak when wife gets in. Sw called wife wife states she will be at hospital after 4:30 pm today. Sw to make plans to meet with patient patients wife in am.    Chase Picket, MSW  Social Worker Case Manager  [facility]   Phone: [phone]  Fax: [phone]

## 2022-04-06 NOTE — Progress Notes (Signed)
4 eyes in 4 hours pressure injury assessment note:      Completed with: IVY, RN  Unit & Time admitted: 9 north             Bony Prominences: Check appropriate box; if wound is present enter wound assessment in LDA     Occiput:                 [x] WNL  []  Wound present  Face:                     [x] WNL  []  Wound present  Ears:                      [x] WNL  []  Wound present  Spine:                    [x] WNL  []  Wound present  Shoulders:             [x] WNL  []  Wound present  Elbows:                  [x] WNL  []  Wound present  Sacrum/coccyx:     [x] WNL  []  Wound present  Ischial Tuberosity:  [x] WNL  []  Wound present  Trochanter/Hip:      [x] WNL  []  Wound present  Knees:                   [x] WNL  []  Wound present  Ankles:                   [x] WNL  []  Wound present  Heels:                    [x] WNL  []  Wound present  Other pressure areas:  []  Wound location       Device related: []  Device name:         LDA completed if wound present: yes  Consult WOCN if necessary n/a    Other skin related issues, ie tears, rash, etc, document in Integumentary flowsheet

## 2022-04-06 NOTE — Progress Notes (Signed)
Inpatient Solid Tumor Team  Secure Chat: [facility] *preferred contact*  [employer] [phone]  After hours, call [phone]    ONCOLOGY INPATIENT PROGRESS NOTE    Date Time: 04/06/22 7:41 PM  Patient Name: [facility]  Attending Oncologist: Dr. Fernanda Drum  Primary Oncologist: Dr. 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 Patient was admitted to [facility] on 7/23 and diagnosed with E. Faecalis UTI and discharged on Cephalexin which he completed, now with new fever and progressive somnolence admitted for further sepsis work up.     Plan:   #Sepsis of unclear etiology: recent hx of E. Faecalis UTI sp treatment with Keflex, currently with no sign of infection; sp broad spectrum abx with Cefepime and Vancomycin switched to Amoxicillin on 04/06/22  - follow up infectious work up per primary team    #Advanced Epitheliod Mesothelioma: sp 6 cycles of carboplatin, pemetrexed, and bevacizumab with progression, started on second like treatment with ipilimumab and nivolumab with most recent PET/CT with increased uptake in mediastinal, retroperitoneal and mesentery lymph nodes.  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 Appears comfortable,  but states that he is too tired to get out of bed and has low appetite. Denies breathing difficulty, but notes dry nares from the nasal cannula. Having regular bowel movement, and denies abdominal pain or issues with urination. Denies cough or headache. No rhinorrhea. States that over the last couple of weeks has been sleeping more and can do less and less around the house. Denies worsening chest pressure or back pain.     He would like to meet with hospice tomorrow alongside his wife.     Medications:     Current Facility-Administered Medications   Medication Dose Route Frequency    amoxicillin  500 mg Oral Q8H SCH    aspirin EC  81 mg Oral Daily    atorvastatin  80 mg Oral QHS    enoxaparin  40 mg Subcutaneous Daily    pregabalin  25 mg Oral TID    tamsulosin  0.4 mg Oral Daily after dinner    terazosin  10 mg Oral QHS       Review of Systems:   A comprehensive review of systems was: Negative except as documented in the HPI.     Physical Exam:     Vitals:    04/06/22 1540   BP: 94/59   Pulse: 77   Resp: 20   Temp: 97.9 F (36.6 C)   SpO2: 94%     Physical Exam  Vitals and nursing note reviewed.   Constitutional:       General: He is not in acute distress.     Appearance: He is ill-appearing. He is not toxic-appearing or diaphoretic.   HENT:  Head: Normocephalic and atraumatic.      Nose: Nose normal.      Mouth/Throat:      Mouth: Mucous membranes are moist.      Pharynx: Oropharynx is clear.   Eyes:      Conjunctiva/sclera: Conjunctivae normal.      Pupils: Pupils are equal, round, and reactive to light.   Cardiovascular:      Rate and Rhythm: Normal rate and regular rhythm.      Pulses: Normal pulses.      Heart sounds: Normal heart sounds. No murmur heard.     No friction rub. No gallop.   Pulmonary:      Effort: No respiratory distress.      Breath sounds: No stridor. Rales present. No wheezing or rhonchi.      Comments: Mild increase in respiratory effort  Chest:      Chest wall: No tenderness.    Abdominal:      General: Abdomen is flat. Bowel sounds are normal. There is no distension.      Palpations: Abdomen is soft. There is no mass.      Tenderness: There is no abdominal tenderness. There is no right CVA tenderness, left CVA tenderness, guarding or rebound.      Hernia: No hernia is present.   Musculoskeletal:         General: Normal range of motion.      Cervical back: Normal range of motion.   Skin:     General: Skin is warm and dry.   Neurological:      General: No focal deficit present.      Mental Status: He is alert and oriented to person, place, and time. Mental status is at baseline.   Psychiatric:         Mood and Affect: Mood normal.         Behavior: Behavior normal.         Thought Content: Thought content normal.         Judgment: Judgment normal.        Labs:     Results       Procedure Component Value Units Date/Time    Culture Blood Aerobic and Anaerobic [[id number]] Collected: 04/05/22 1522    Specimen: Arm from Blood, Venipuncture Updated: 04/06/22 1821    Narrative:      ORDER#: [id number]                                    ORDERED BY: Celine Mans  SOURCE: Blood, Venipuncture SP RAC                   COLLECTED:  04/05/22 15:22  ANTIBIOTICS AT COLL.:                                RECEIVED :  04/05/22 18:20  Culture Blood Aerobic and Anaerobic        PRELIM      04/06/22 18:21  04/06/22   No Growth after 1 day/s of incubation.      Respiratory Pathogen Panel w/COVID-19, PCR [[id number]] Collected: 04/06/22 0043    Specimen: Nasopharyngeal Updated: 04/06/22 1053     Adenovirus Not Detected     Coronavirus 229E Not Detected     Coronavirus HKU1 Not Detected     Coronavirus NL63 Not Detected  Coronavirus OC43 Not Detected     SARS CoV-2 RNA Not Detected     Human Metapneumovirus Not Detected     Human Rhinovirus/Enterovirus Not Detected     Influenza A Not Detected     Influenza B Not Detected     Parainfluenza Virus 1 Not Detected     Parainfluenza Virus 2 Not Detected      Parainfluenza Virus 3 Not Detected     Parainfluenza Virus 4 Not Detected     Respiratory Syncytial Virus Not Detected     Bordetella pertussis Not Detected     Bordetella parapertussis PCR Not Detected     Chlamydophila pneumoniae Not Detected     Mycoplasma pneumoniae Not Detected     Source NP Swab     Test Comment for Respiratory Panel with COVID See Below     Purpose of COVID testing Screening    Narrative:      For NP, Call laboratory for VTM NP Swab collection device.  Bronchoalveolar Lavage is submitted in a sterile container.  Screening    Basic Metabolic Panel [[id number]] Collected: 04/06/22 0405    Specimen: Blood Updated: 04/06/22 0529     Glucose 92 mg/dL      BUN 69.6 mg/dL      Creatinine 0.7 mg/dL      Calcium 9.1 mg/dL      Sodium 295 mEq/L      Potassium 4.3 mEq/L      Chloride 103 mEq/L      CO2 23 mEq/L      Anion Gap 9.0     eGFR >60.0 mL/min/1.73 m2     Urinalysis Reflex to Microscopic Exam- Reflex to Culture [[id number]]  (Abnormal) Collected: 04/06/22 0405    Specimen: Urine, Clean Catch Updated: 04/06/22 0516     Urine Type Urine, Clean Ca     Color, UA Yellow     Clarity, UA Clear     Specific Gravity UA 1.021     Urine pH 6.0     Leukocyte Esterase, UA Negative     Nitrite, UA Negative     Protein, UR 20= Trace     Glucose, UA Negative     Ketones UA Negative     Urobilinogen, UA Normal mg/dL      Bilirubin, UA Negative     Blood, UA Negative     RBC, UA 0-2 /hpf      WBC, UA 0-5 /hpf      Squamous Epithelial Cells, Urine 0-5 /hpf      Urine Mucus Present    CBC without differential [[id number]]  (Abnormal) Collected: 04/06/22 0405    Specimen: Blood Updated: 04/06/22 0507     WBC 6.05 x10 3/uL      Hgb 8.7 g/dL      Hematocrit 44.0 %      Platelets 315 x10 3/uL      RBC 3.11 x10 6/uL      MCV 89.7 fL      MCH 28.0 pg      MCHC 31.2 g/dL      RDW 15 %      MPV 9.9 fL      Nucleated RBC 0.0 /100 WBC      Absolute NRBC 0.00 x10 3/uL     Culture Blood Aerobic and Anaerobic [[id number]]  Collected: 04/05/22 1538    Specimen: Arm from Blood, Venipuncture Updated: 04/05/22 1947    Narrative:      1 BLUE+1  PURPLE              Rads:   Radiological Procedure reviewed.    Patient to be discussed with primary oncologist Dr. Fernanda Drum.     Leanne Chang, DO  Hematology/Oncology Fellow, PGY4  [facility]  [address]  [place] [address]

## 2022-04-07 DIAGNOSIS — R509 Fever, unspecified: Secondary | ICD-10-CM

## 2022-04-07 LAB — BASIC METABOLIC PANEL
Anion Gap: 11 (ref 5.0–15.0)
BUN: 9 mg/dL (ref 9.0–28.0)
CO2: 21 mEq/L (ref 17–29)
Calcium: 8.9 mg/dL (ref 7.9–10.2)
Chloride: 101 mEq/L (ref 99–111)
Creatinine: 0.8 mg/dL (ref 0.5–1.5)
Glucose: 95 mg/dL (ref 70–100)
Potassium: 3.8 mEq/L (ref 3.5–5.3)
Sodium: 133 mEq/L — ABNORMAL LOW (ref 135–145)
eGFR: >60 mL/min/{1.73_m2} (ref >=60)

## 2022-04-07 LAB — CBC
Absolute NRBC: 0 10*3/uL (ref 0.00–0.00)
Hematocrit: 27.6 % — ABNORMAL LOW (ref 37.6–49.6)
Hgb: 8.5 g/dL — ABNORMAL LOW (ref 12.5–17.1)
MCH: 28.1 pg (ref 25.1–33.5)
MCHC: 30.8 g/dL — ABNORMAL LOW (ref 31.5–35.8)
MCV: 91.1 fL (ref 78.0–96.0)
MPV: 10.1 fL (ref 8.9–12.5)
Nucleated RBC: 0 /100 WBC (ref 0.0–0.0)
Platelets: 332 10*3/uL (ref 142–346)
RBC: 3.03 10*6/uL — ABNORMAL LOW (ref 4.20–5.90)
RDW: 15 % (ref 11–15)
WBC: 7.84 10*3/uL (ref 3.10–9.50)

## 2022-04-07 MED ORDER — FLUTICASONE PROPIONATE 50 MCG/ACT NA SUSP
1.0000 | Freq: Every day | NASAL | Status: DC
Start: 2022-04-07 — End: 2022-04-08
  Administered 2022-04-07: 1 via NASAL
  Filled 2022-04-07 (×2): qty 16

## 2022-04-07 NOTE — Provider Clarification Note (Signed)
Patient Name: [name] [name], [name] [name]  Account #: [id number]   MR #: [id number]  Discharge Date:      Dear Dr. Nani Gasser,    Your patient has mesothelioma. Documentation of primary/metastatic site(s) are requested, if known.    Patient history/risk factors: Sepsis due to UTI - mesothelioma.     H&P: Mesothelioma - dx 2022, currently receiving immunotherapy    Oncology PN 8/01: 'Advanced Epithelioid Mesothelioma: sp 6 cycles of carboplatin, pemetrexed, and bevacizumab with progression, started on second like treatment with ipilimumab and nivolumab with most recent PET/CT with increased uptake in mediastinal, retroperitoneal and mesentery lymph nodes.recent imaging with progression of disease, currently on immunotherapy with discussion of possible clinical trial'    CXR 7/31: 'Stable right-sided effusion and opacities'    Treatment: Immunotherapy    Question: Please clarify if the primary/metastatic sites of mesothelioma are known:    Epithelioid pleural mesothelioma with metastases (please specify metastatic sites)  Epithelioid mesothelioma of other primary site (please specify primary/metastatic sites)  Epithelioid mesothelioma of unknown primary site (please specify metastatic sites)  Epithelioid mesothelioma, unspecified   Other response (please specify)    Thank you for your time and documentation. Please let me know if you have any questions.     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

## 2022-04-07 NOTE — Plan of Care (Signed)
Pt is A&Ox4.  Room air.   On 3L oxygen NC.   Prn tylenol given this morning for temp of 100.31F. MD notified.   PT/OT.   Pain management.   Fall precaution maintained. Call bell within reach. Bed alarm on. Family at bedside.           Problem: Inadequate Cardiac Output  Goal: Adequate tissue perfusion will be maintained  Outcome: Progressing  Flowsheets (Taken 04/07/2022 1632)  Adequate tissue perfusion will be maintained:   Monitor/assess vital signs   Monitor/assess lab values and report abnormal values   Monitor intake and output     Problem: Infection  Goal: Free from infection  Outcome: Progressing  Flowsheets (Taken 04/07/2022 1632)  Free from infection:   Assess for signs/symptoms of infection   Utilize sepsis protocol     Problem: Inadequate Gas Exchange  Goal: Patent Airway maintained  Outcome: Progressing  Flowsheets (Taken 04/07/2022 1632)  Patent airway maintained: Reinforce use of ordered respiratory interventions (i.e. CPAP, BiPAP, Incentive Spirometer, Acapella, etc.)     Problem: Nutrition  Goal: Nutritional intake is adequate  Outcome: Progressing  Flowsheets (Taken 04/07/2022 1632)  Nutritional intake is adequate:   Assist patient with meals/food selection   Allow adequate time for meals

## 2022-04-07 NOTE — Plan of Care (Signed)
Problem: Moderate/High Fall Risk Score >5  Goal: Patient will remain free of falls  Outcome: Progressing  Flowsheets (Taken 04/06/2022 2200)  Moderate Risk (6-13):   MOD-Apply bed exit alarm if patient is confused   MOD-Floor mat at bedside (where available) if appropriate   MOD-Remain with patient during toileting   MOD-Utilize diversion activities   MOD-Perform dangle, stand, walk (DSW) prior to mobilization     Problem: Inadequate Gas Exchange  Goal: Adequate oxygenation and improved ventilation  Outcome: Progressing  Flowsheets (Taken 04/07/2022 0018)  Adequate oxygenation and improved ventilation:   Assess lung sounds   Provide mechanical and oxygen support to facilitate gas exchange   Monitor SpO2 and treat as needed   Position for maximum ventilatory efficiency   Teach/reinforce use of incentive spirometer 10 times per hour while awake, cough and deep breath as needed   Increase activity as tolerated/progressive mobility   Plan activities to conserve energy: plan rest periods     Problem: Compromised skin integrity  Goal: Skin integrity is maintained or improved  Outcome: Progressing  Flowsheets (Taken 04/07/2022 0018)  Skin integrity is maintained or improved:   Assess Braden Scale every shift   Turn or reposition patient every 2 hours or as needed unless able to reposition self   Increase activity as tolerated/progressive mobility   Relieve pressure to bony prominences   Keep head of bed 30 degrees or less (unless contraindicated)

## 2022-04-07 NOTE — Progress Notes (Signed)
[facility]  Adult Medicine Hospitalists           Progress Note    Assessment / Plan:   [name] [name] is a 71 y.o. male with a history of mesothelioma, former smoker, HLD, arthritis, HLD, sent from [place] due to fevers, found to have UTI.     Sepsis due to UTI  - present on admission, resolving  - growing e faecalis, not treated by abx, likely cause of persistent fevers  - continue amoxicillin for abx  - blood cx neg x 1 day, will continue to follow  - viral panel neg   - spiked a low grade 100.2 today, will monitor    Chronic respiratory failure secondary to mesothelioma  - on immunotherapy  - following with [facility] for tx  - hospice consulted, to speak to patient and his wife today, to explain function and roles of hospice, need more time    - pain control with home meds  - chronic pleural effusions     Hyponatremia  - mild  - monitor     Subjective:   Subjective   Patient states he still feels sleepy. His dyspnea at baseline. Denies urinary symptoms at this time.     Objective:   Objective   Patient Vitals for the past 24 hrs:   BP Temp Temp src Pulse Resp SpO2   04/07/22 0815 -- -- -- -- -- 90 %   04/07/22 0812 122/71 100.2 F (37.9 C) Oral 86 (!) 24 (!) 85 %   04/07/22 0359 111/69 98.2 F (36.8 C) Oral 89 18 93 %   04/06/22 2331 105/62 98.2 F (36.8 C) Oral 67 20 91 %   04/06/22 1954 93/60 97.3 F (36.3 C) Oral 82 20 93 %   04/06/22 1540 94/59 97.9 F (36.6 C) Oral 77 20 94 %   04/06/22 1325 124/71 99.1 F (37.3 C) Oral 98 20 94 %   04/06/22 1135 90/50 98.8 F (37.1 C) Oral (!) 101 (!) 24 95 %        Physical Exam  PE  A&Ox3, comfortable, no acute distress  No scleral icterus, no visible head trauma   RRR, no M/R/G  Lungs bilaterally clear to ascultation   BS+, no organomegaly appreciated, no tenderness on palpation  No peripheral edema    Chart Review:   The following chart items were reviewed as of 10:16 AM on 04/07/22:  [x]  Lab Results [x]  Imaging Results   [x]  Problem List  [x]   Current Orders [x]  Current Medications  []  Allergies  []  Code Status []  Advanced Care Planning  []  SDoH          Signed by: , MD  [facility] Hospitalist  Epic chat me or page at [phone]  After Hours Pager:  [phone] [phone])

## 2022-04-07 NOTE — Progress Notes (Signed)
[phone] / Available 24 hours      Hospice Informational Visit:    Date Time: 04/07/22 11:18 AM   Patient Name: [name] [name],[name] [name]   Requesting Physician: Leanne Chang, MD   Present at Visit: Pt, Pt's Son, Pt's Spouse     Visit Outcome:   I had the pleasure of speaking w/ [name]. CM Melton Alar was also present- I introduced self, role & reason for the visit. I discussed [facility] goals of care, levels of care & philosophies. I went over GIP & routine hospice & mentioned pt currently meets criteria for routine hospice at home. We discussed DME as well as the frequency of the hospice teams home visits. Family was in agreement w/ my assessment however wanted more time to go over options & process all the information.     I left family w/ [facility] 24 hr RN number, care guide & [facility] team number    Hospice Care Guide Provided:     [x]  YES     []  NO   Hospice Consent Signed:     []  YES     [x]  NO       Next Steps:   Family to call or notify CM when ready to sign hospice consents      Signed by: Rosalio Loud

## 2022-04-07 NOTE — Progress Notes (Signed)
Initial Case Management Assessment and Discharge Planning  [facility]   Patient Name: [name] [name], [name] [name]   Date of Birth August 25, 1951   Attending Physician: Leanne Chang, MD   Primary Care Physician: Pcp, None, MD   Length of Stay 2   Reason for Consult / Chief Complaint Initial assessment        Situation   Admission DX:   1. Mesothelioma    2. Fever in adult        A/O Status: Unable to Assess    Patient admitted from: ER    Admission Status: inpatient    Health Care Agent: Self  Name: Garris Melhorn  Phone number: [phone]       Background   Residence: One story home/apartment    PCP: PCP None, MD    Patient Contact:   [phone] (home)     [phone] (mobile)     Emergency contact:   Extended Emergency Contact Information  Primary Emergency Contact: Graddy,Barbara  Address: [address]           [place], [place] [address] [place]  Mobile Phone: [phone]  Relation: Spouse  Preferred language: English  Interpreter needed? No  Secondary Emergency Contact: Panozzo,[name]  Mobile Phone: [phone]  Relation: Son  Preferred language: English     Support system:  family    ADL's: Independent but requires excessive time    Previous Level of function: 7 Independent     DME: Radio producer    Pharmacy:    [id number] - [place], [place] - [address]  [address]  [place] [address]  Phone: [phone] Fax: [phone]       Prescription Coverage: Yes    Home Health: The patient is not currently receiving home health services.    Previous SNF/AR: no     COVID Vaccine Status: Vaccinated     Date First IMM given: n/a    UAI on file?: Yes    Transport for discharge? Mode of transportation: Sales executive - Family/Friend to drive patient    Agreeable to Home with family post-discharge:  Yes     Assessment   Assessment completed at bedside with spouse and son. Patient resides with wife. Patient wife states in the last two weeks  pt developed a bladder/kidney infection and seems to have declined since then. Patient uses a cane and a walker when walking. Patients son states there are stairs in the home but there are no stairs where pt would be staying and will be able to get around fairly well. Patient doe snot have a pcp.Sw to give [facility] clinic information. Sw spoke to the family about hospice and answered all family questions in detail sw to follow up with family about choice.  BARRIERS TO DISCHARGE: Patients family to decide between hospice and or home health     Recommendation   D/C Plan A: Home with family    D/C Plan B: Home with family    D/C Plan C: Home with family

## 2022-04-08 LAB — BASIC METABOLIC PANEL
Anion Gap: 6 (ref 5.0–15.0)
BUN: 7 mg/dL — ABNORMAL LOW (ref 9.0–28.0)
CO2: 26 mEq/L (ref 17–29)
Calcium: 8.3 mg/dL (ref 7.9–10.2)
Chloride: 100 mEq/L (ref 99–111)
Creatinine: 0.7 mg/dL (ref 0.5–1.5)
Glucose: 112 mg/dL — ABNORMAL HIGH (ref 70–100)
Potassium: 3.6 mEq/L (ref 3.5–5.3)
Sodium: 132 mEq/L — ABNORMAL LOW (ref 135–145)
eGFR: >60 mL/min/{1.73_m2} (ref >=60)

## 2022-04-08 LAB — CBC
Absolute NRBC: 0 10*3/uL (ref 0.00–0.00)
Hematocrit: 24.9 % — ABNORMAL LOW (ref 37.6–49.6)
Hgb: 7.9 g/dL — ABNORMAL LOW (ref 12.5–17.1)
MCH: 27.9 pg (ref 25.1–33.5)
MCHC: 31.7 g/dL (ref 31.5–35.8)
MCV: 88 fL (ref 78.0–96.0)
MPV: 9.5 fL (ref 8.9–12.5)
Nucleated RBC: 0 /100 WBC (ref 0.0–0.0)
Platelets: 276 10*3/uL (ref 142–346)
RBC: 2.83 10*6/uL — ABNORMAL LOW (ref 4.20–5.90)
RDW: 15 % (ref 11–15)
WBC: 6.37 10*3/uL (ref 3.10–9.50)

## 2022-04-08 MED ORDER — FLUTICASONE PROPIONATE 50 MCG/ACT NA SUSP
1.0000 | Freq: Every day | NASAL | 0 refills | Status: DC
Start: 2022-04-09 — End: 2022-05-13

## 2022-04-08 MED ORDER — PREGABALIN 25 MG PO CAPS
25.0000 mg | ORAL_CAPSULE | Freq: Three times a day (TID) | ORAL | 0 refills | Status: AC
Start: 2022-04-08 — End: 2022-05-08

## 2022-04-08 MED ORDER — AMOXICILLIN 500 MG PO CAPS
500.0000 mg | ORAL_CAPSULE | Freq: Three times a day (TID) | ORAL | 0 refills | Status: AC
Start: 2022-04-08 — End: 2022-04-13

## 2022-04-08 MED ORDER — LIDOCAINE 4 % EX CREA
TOPICAL_CREAM | Freq: Once | CUTANEOUS | Status: DC
Start: 2022-04-08 — End: 2022-04-08

## 2022-04-08 MED ORDER — SODIUM CHLORIDE 0.9 % IV SOLN
20.0000 mL/h | INTRAVENOUS | Status: DC
Start: 2022-04-08 — End: 2022-04-08
  Administered 2022-04-08: 20 mL/h via INTRAVENOUS

## 2022-04-08 MED ORDER — SODIUM CHLORIDE (PF) 0.9 % IJ SOLN
10.0000 mL | Freq: Once | INTRAMUSCULAR | Status: AC
Start: 2022-04-08 — End: 2022-04-08
  Administered 2022-04-08: 10 mL via INTRAVENOUS

## 2022-04-08 NOTE — Discharge Summary (Signed)
[facility]  Adult Medicine Hospitalists           MEDICINE DISCHARGE SUMMARY    Date of Admission: 04/05/2022  Date of Discharge: No discharge date for patient encounter.    Discharge Diagnoses:   Sepsis  UTI  Chronic respiratory failure  Mesothelioma   Hyponatremia     Hospital Course:     Reason for Admission:  Patient referred from  for elevated fever with concern for Sepsis.  Recently discharged from [facility] for UTI for which he is still taking Keflex.  Reports worsening fatigue in addition to exacerbation of chronic hip and back pain.  Generally uses 2L O2 -- currently usign 4L.     Followed by Palliative care at [facility] -- receiving Oxycodon and Lyrica.  Did not find Marinol to be helpful, also not covered by insuraance.     Hospital Course:  Patient presented septic secondary to a UTI that failed outpatient abx. Cultures revealed e faecalis sensitive to ampicillin, discharged on amoxicillin. Blood cultures and viral panel negative. Stable for discharge to go home with home hospice.    Discharge Day Exam:  Temp:  [98.2 F (36.8 C)-99.9 F (37.7 C)] 98.2 F (36.8 C)  Heart Rate:  [73-92] 74  Resp Rate:  [19-23] 22  BP: (91-128)/(50-62) 112/57  Physical Exam  PE  A&Ox3, comfortable, no acute distress  No scleral icterus, no visible head trauma   RRR, no M/R/G  Lungs bilaterally clear to ascultation   BS+, no organomegaly appreciated, no tenderness on palpation  No peripheral edema  Pertinent Labs:   CBC:   Recent Labs   Lab 04/08/22  0411 04/07/22  0408 04/06/22  0405   Sodium 132* 133* 135   Potassium 3.6 3.8 4.3   Chloride 100 101 103   CO2 26 21 23    BUN 7.0* 9.0 10.0   Creatinine 0.7 0.8 0.7   Calcium 8.3 8.9 9.1   Glucose 112* 95 92     BMP:   Recent Labs   Lab 04/08/22  0411 04/07/22  0408 04/06/22  0405   Sodium 132* 133* 135   Potassium 3.6 3.8 4.3   Chloride 100 101 103   CO2 26 21 23    BUN 7.0* 9.0 10.0   Creatinine 0.7 0.8 0.7   Calcium 8.3 8.9 9.1   Glucose 112* 95 92        Radiology and Procedures:   Radiology: all results from this admission  XR Chest  AP Portable    Result Date: 04/05/2022   Stable right-sided effusion and opacities, Bosie Helper, MD 04/05/2022 2:55 PM    XR Chest 2 Views    Result Date: 03/30/2022  Stable right-sided predominantly basilar infiltrate and moderate right effusion. Minimal left basilar atelectasis. Prince Solian, MD 03/30/2022 8:09 AM    CT Chest without Contrast    Result Date: 03/28/2022   Pleural thickening in the right hemithorax is slightly increased from previous exam in January, and atelectasis of the right lung due to pleural disease is also slightly increased. [name], MD 03/28/2022 3:22 PM    Chest AP Portable    Result Date: 03/28/2022  Worsening appearance of the right hemithorax since 09/26/2021. Al Decant, MD 03/28/2022 11:49 AM    CT Abdomen Pelvis W WO IV/ WO PO Cont    Result Date: 03/21/2022  1. Small nonobstructive right renal calculus. 2. The prostate is enlarged, and the bladder is distended and has a trabeculated  wall likely due to chronic bladder outlet obstruction. 3. Right pleural masses and trace right pleural effusion are consistent with reported clinical history of mesothelioma. 4. Additional chronic findings are detailed above. [name], MD 03/21/2022 10:55 PM       Discharge Medications and Documented Allergies:        Discharge Medication List        Taking      acetaminophen 500 MG tablet  Dose: 1,000 mg  Commonly known as: TYLENOL  Take 2 tablets (1,000 mg) by mouth every 6 (six) hours as needed     albuterol sulfate HFA 108 (90 Base) MCG/ACT inhaler  Commonly known as: PROVENTIL     amoxicillin 500 MG capsule  Dose: 500 mg  Commonly known as: AMOXIL  Take 1 capsule (500 mg) by mouth every 8 (eight) hours for 5 days     aspirin 81 MG EC tablet  Dose: 81 mg  Take 1 tablet (81 mg) by mouth daily     atorvastatin 80 MG tablet  Dose: 1 tablet  Commonly known as: LIPITOR  Take 1 tablet (80 mg) by mouth  daily     dronabinol 2.5 MG capsule  Dose: 2.5 mg  Commonly known as: MARINOL  Take 1 capsule (2.5 mg) by mouth 2 (two) times daily     fluticasone 50 MCG/ACT nasal spray  Dose: 1 spray  Commonly known as: FLONASE  Start taking on: April 09, 2022  1 spray by Nasal route daily     glucosamine-chondroitin 500-400 MG tablet  Dose: 1 tablet  Take 1 tablet by mouth daily     L-Lysine 1000 MG Tabs  Dose: 1-2 tablet  Take 1-2 tablets (1,000-2,000 mg) by mouth     lidocaine-prilocaine cream  Commonly known as: EMLA  Apply to the Port-A-Cath site 30-60-minute before chemotherapy.     Nasal Spray 0.05 % Soln  Dose: 1 spray  1 spray by Nasal route as needed     ondansetron 8 MG tablet  Commonly known as: ZOFRAN  For: Nausea and Vomiting caused by Cancer Chemotherapy  Take 1 tablet (8 mg) by mouth every 8 hours as needed for nausea and/or vomiting.     pregabalin 25 MG capsule  Dose: 25 mg  Commonly known as: LYRICA  Take 1 capsule (25 mg) by mouth 3 (three) times daily     prochlorperazine 10 MG tablet  Commonly known as: COMPAZINE  every 6 (six) hours as needed     terazosin 10 MG capsule  Dose: 10 mg  Commonly known as: HYTRIN  Take 1 capsule (10 mg) by mouth daily     UNABLE TO FIND  Mushroom super blend (immunity and brain supplement) daily            STOP taking these medications      cephALEXin 500 MG capsule  Commonly known as: KEFLEX     tamsulosin 0.4 MG Caps  Commonly known as: FLOMAX              No Known Allergies    Most Recent TPN Formulation (If used):   (Show up to 1 orders; looking back 3 days from today; newest on the left.)       None              Disposition:      Home with family, Home Health Services, and Home Hospice    Discharge Code Status: DNR/AND  Patient Emergency Contact:  Extended  Emergency Contact Information  Primary Emergency Contact: Littrell,Barbara  Address: [address]           [place], [place] [address] [place]  Mobile Phone: [phone]  Relation: Spouse  Preferred language:  English  Interpreter needed? No  Secondary Emergency Contact: Pieri,[name]  Mobile Phone: [phone]  Relation: Son  Preferred language: English      Discharge Instructions:     Patient Instructions: (See AVS for full details)  Follow up with onc    Diet: regular  Activity/Weight Bearing Status: as tolerated      Outpatient Follow-Up Plan:     Appointments:      Pending Labs, Microbiology, and Pathology:  Unresulted Labs       None              Attestations and Signatures:     Minutes spent coordinating discharge and reviewing discharge plan: 55 minutes    Signed by: Leanne Chang, MD  [facility]  Department of Medicine  P: [phone]  F: [phone]

## 2022-04-08 NOTE — Plan of Care (Signed)
Problem: Moderate/High Fall Risk Score >5  Goal: Patient will remain free of falls  Outcome: Progressing     Problem: Inadequate Cardiac Output  Goal: Adequate tissue perfusion will be maintained  Outcome: Progressing     Problem: Infection  Goal: Free from infection  Outcome: Progressing  Flowsheets (Taken 04/08/2022 0558)  Free from infection:   Assess for signs/symptoms of infection   Consult/collaborate with Infection Preventionist     Problem: Inadequate Gas Exchange  Goal: Adequate oxygenation and improved ventilation  Outcome: Progressing  Flowsheets (Taken 04/08/2022 0558)  Adequate oxygenation and improved ventilation:   Assess lung sounds   Monitor SpO2 and treat as needed   Position for maximum ventilatory efficiency   Increase activity as tolerated/progressive mobility   Teach/reinforce use of incentive spirometer 10 times per hour while awake, cough and deep breath as needed   Provide mechanical and oxygen support to facilitate gas exchange   Plan activities to conserve energy: plan rest periods  Goal: Patent Airway maintained  Outcome: Progressing  Flowsheets (Taken 04/08/2022 0558)  Patent airway maintained:   Position patient for maximum ventilatory efficiency   Provide adequate fluid intake to liquefy secretions   Reinforce use of ordered respiratory interventions (i.e. CPAP, BiPAP, Incentive Spirometer, Acapella, etc.)   Reposition patient every 2 hours and as needed unless able to self-reposition     Problem: Compromised skin integrity  Goal: Skin integrity is maintained or improved  Outcome: Progressing     Problem: Nutrition  Goal: Nutritional intake is adequate  Outcome: Progressing  Flowsheets (Taken 04/08/2022 0558)  Nutritional intake is adequate:   Encourage/perform oral hygiene as appropriate   Encourage/administer dietary supplements as ordered (i.e. tube feed, TPN, oral, OGT/NGT, supplements)   Consult/collaborate with Clinical Nutritionist   Allow adequate time for meals   Include  patient/patient care companion in decisions related to nutrition

## 2022-04-08 NOTE — PT Progress Note (Signed)
Physical Therapy Treatment  [name] [name]  Post Acute Care Therapy Recommendations:     Discharge Recommendations:  Home with supervision, Home with home health PT    DME needs IF patient is discharging home: Front wheel walker    Therapy discharge recommendations may change with patient status.  Please refer to most recent note for up-to-date recommendations.      Assessment:   Significant Findings: none    Patient received in bed, agreeable to therapy. Patient tolerated longer walk today, sba with FWW x 125 feet. O2 stable on 3L after walking (>92%). Patient steady overall without LOB. Good tolerance for seated exercises in chair and EOB. Patient would benefit from using FWW initially at this time for increased safety/support. Patient progressing towards goals below and continues to benefit from skilled therapy.        Treatment Activities: gait training, HEP, energy conservation    Educated the patient to role of physical therapy, plan of care, goals of therapy and HEP, safety with mobility and ADLs, energy conservation techniques, home safety.    Plan:   PT Frequency: 3-4x/wk    Continue plan of care.    Unit: [facility]  Bed: N829/F621.30     Precautions and Contraindications:   Falls      Updated Medical Status/Imaging/Labs:   reviewed    Subjective:      Patient's medical condition is appropriate for Physical Therapy intervention at this time.  Patient is agreeable to participation in the therapy session. Nursing clears patient for therapy.    Pain:   denies      Objective:   Patient is in bed with O2 at 3 liters/minute via nasal cannula in place.  Pt wore mask during therapy session:No      Cognition  Sleepy initially but increased alertness with mobility  Follows commands, appears oriented    Functional Mobility  Rolling: NT  Supine to Sit: sup with rail and HOB up, increased time  Scooting: sba  Sit to Stand: sba  Stand to Sit: sba  Transfers: sba with FWW    Ambulation  PMP -  Progressive Mobility Protocol   PMP Activity: Step 7 - Walks out of Room  Distance Walked (ft) (Step 6,7): 125 Feet   Level of Assistance required: sba  Pattern: steady overall without LOB, cues for posture  Device Used: FWW  Weightbearing Status: FWB BLEs  Stair Management: NT  Number of Stairs: NA  Door Management: NT  Wheelchair Management: NT    Balance  Static Sitting: sup  Dynamic Sitting: sup  Static Standing: sba  Dynamic Standing: sba    Therapeutic Exercises  BLE: AP, marching, knee ext, sit/stand trials    Patient Participation: good  Patient Endurance: good-, O2 stable on 3L with activity >92%    Patient left with call bell within reach, all needs met, SCDs off, fall mat in place, bed alarm off, chair alarm off (alarm pad in place, tech/RN called down for blue box. RN ok in chair without alarm until it arrives) and all questions answered. RN notified of session outcome and patient response.     Goals:  Goals  Goal Formulation: With patient  Time for Goal Acheivement: 3 visits  Pt Will Go Supine To Sit: independent  Pt Will Transfer Bed/Chair: with supervision  Pt Will Ambulate: with supervision, 101-150 feet (or LRD)  Pt Will Perform Home Exer Program: independent      PPE worn during session: gloves  Tech present: no  PPE worn by tech: N/A    Theodora Blow, PT  [phone]    Time of Treatment:  PT Received On: 04/08/22  Start Time: 0820  Stop Time: 0845  Time Calculation (min): 25 min  Treatment # 1 out of 3 visits

## 2022-04-08 NOTE — Progress Notes (Signed)
Case Management Progress Note:  SW spoke with patient and patient's wife at bedside. Patient and spouse agreed to hospice in home, but have not made a decision on agency.  SW provided patient and spouse with Hospice agencies and contact information for this SW. Patient and spouse reported that they would like to review and will notify this SW once they have made choice.     Patient reported that he resides in home with spouse, adult son and daughter in law also reside in same home.  Patient has walker and cane at home, grab bars in the shower patient has home 02.  SW discussed options for obtaining shower chair and family stated they would obtain retail. Patient's spouse comfortable with transporting patient home.     SW will follow for Hospice referral.     [name] [name], MSW  Social Work Case Manager  [phone]

## 2022-04-08 NOTE — Discharge Instr - AVS First Page (Addendum)
Reason for your Hospital Admission:  You presented with fevers despite taking antibiotics for a UTI. You were found to be growing a bacteria called E. Faecalis that was not susceptible to the antibiotics you were prescribed. Your antibiotics were changed and your fevers stopped.  Hospice was also consulted and you will be set up with home hospice per your wishes.    Instructions for after your discharge:  Complete course of antibiotics

## 2022-04-08 NOTE — Progress Notes (Signed)
SW received call from nurse inquiring about assistance with transportation for patient.    SW contacted pt's son, [name] [name] [phone] who advised that he would stop at home to obtain dad's oxygen tank and would be at the hospital within the hour.    Sw advised son to call the SW Dept so that she can direct him to where he needs to go.    Stoney Bang, LMSW  Emergency Department Social Worker  Care Management Department  [facility]  [phone]

## 2022-04-08 NOTE — Progress Notes (Signed)
Pt. Alert and oriented x4. Discharge instructions provided by Villa Herb, charge RN. Mediport de-accessed. Peripheral Ivs removed. Belongings with patient.

## 2022-04-08 NOTE — OT Eval Note (Signed)
Occupational Therapy Eval [name] [name]        Post Acute Care Therapy Recommendations:     Discharge Recommendations:  Home with supervision    DME needs IF patient is discharging home: No additional equipment/DME recommended at this time    Therapy discharge recommendations may change with patient status.  Please refer to most recent note for up-to-date recommendations.    Assessment:   Significant Findings: None    [name] [name] is a 71 y.o. male admitted 04/05/2022.  Pt presents with impairments listed below negatively impacting performance of ADLs and functional mobility. Prior to admit, pt reports he was IND for ADLs and functional mobility without AD. On eval, pt is requiring SUP for bed mobility using automatic bed features (HOB raised; bed rail), SB-CGA for transfers and functional mobility, min A for ADLs. He demonstrates decreased overall activity tolerance and endurance benefiting from rest breaks with activity. Pt is performing below functional baseline; recommend continued skilled OT services to further progress pt's independence and safety with ADLs, functional mobility, and transfers.     Impairments: Assessment: decreased strength;balance deficits;decreased safety awareness;decreased independence with IADLs;decreased endurance/activity tolerance;decreased independence with ADLs    Therapy Diagnosis: Decreased endurance     Rehabilitation Prognosis: Good;With family     Treatment Activities: OT Evaluation, ADL Training     Educated the patient to role of occupational therapy, plan of care, goals of therapy, safety with mobility and ADLs, energy conservation , and pursed lip breathing     Plan:   OT Frequency Recommended: 3-4x/wk     Treatment Interventions: ADL retraining;Functional transfer training;UE strengthening/ROM;Endurance training;Patient/Family training;Equipment eval/education;Compensatory technique education     Risks/benefits/POC discussed Patient       Unit: [facility]  Bed: Z610/R604.54       Precautions  Weight Bearing Status: no restrictions  Other Precautions: falls, monitor O2    Consult received for [name] [name] for OT Evaluation and Treatment.  Patient's medical condition is appropriate for Occupational Therapy intervention at this time.    Admitting Diagnosis: Mesothelioma [C45.9]  Fever in adult [R50.9]      History of Present Illness:    [name] [name] is a 71 y.o. male admitted on 04/05/2022 with 'h/o Mesothelioma, Former Smoker, Chronic Arthritis, BPH, HLD referred from [facility] for Fever in setting of recent UTI at [facility] concerning for Sepsis vs disease progression.' Per H&P    Past Medical/Surgical History:  Past Medical History:   Diagnosis Date    Arthritis     Hyperlipidemia     Lung anomaly     Malignant neoplasm         Past Surgical History:   Procedure Laterality Date    CHOLECYSTECTOMY          Imaging/Tests/Labs:  XR Chest  AP Portable    Result Date: 04/05/2022   Stable right-sided effusion and opacities, Bosie Helper, MD 04/05/2022 2:55 PM      Lab Results   Component Value Date/Time    HGB 7.9 (L) 04/08/2022 04:11 AM    HCT 24.9 (L) 04/08/2022 04:11 AM    K 3.6 04/08/2022 04:11 AM    NA 132 (L) 04/08/2022 04:11 AM        Social History:   Prior Level of Function:  Prior level of function: Independent with ADLs, Ambulates independently  Baseline Activity Level: Community ambulation (limited community distances recently)  Driving:  (not recently since getting sick)  Cooking: Yes, light meal prep, independent  DME Currently at Home: ADL- Paediatric nurse, ADL- Grab Bars, Home O2 (Has been using O2 at home since recent [place] [facility])    Home Living Arrangements:  Living Arrangements: Spouse/significant other, Family members  Type of Home: House  Home Layout:  (lives in basement apt of son's home, pt reports to OT 2 STE chart indicates no stairs; recommend f/u with family to confirm)  Bathroom Shower/Tub: Walk-in Physiological scientist: Software engineer: Grab bars in Air traffic controller, Paediatric nurse  DME Currently at Home: ADL- Paediatric nurse, ADL- Grab Bars, Home O2 (Has been using O2 at home since recent [place] from [facility])  Home Living - Notes / Comments: Pt's wife is retired and able to assist pt as needed.    Subjective: "I don't need any of that"    Patient is agreeable to participation in the therapy session. Nursing clears patient for therapy.     Patient Goal: To go home    Pain Assessment  Pain Assessment: No/denies pain      Objective:   Observation of Patient/Vital Signs:  Patient is in bed with peripheral IV, 3L O2 via NC in place.  Pt wore mask during therapy session: No      Cognitive Status and Neuro Exam:  Cognition/Neuro Status  Arousal/Alertness: Appropriate responses to stimuli  Attention Span: Appears intact  Orientation Level: Oriented X4  Memory: Appears intact  Following Commands: independent  Safety Awareness: minimal verbal instruction  Insights: Decreased awareness of deficits;Educated in safety awareness  Problem Solving: supervision  Behavior: calm;cooperative  Motor Planning: intact  Coordination: intact    Neuro Status  Behavior: calm;cooperative  Motor Planning: intact  Coordination: intact    Musculoskeletal Examination  Gross ROM  Right Upper Extremity ROM: within functional limits  Left Upper Extremity ROM: within functional limits    Gross Strength  Right Upper Extremity Strength: 4/5  Left Upper Extremity Strength: 4/5      Sensory/Oculomotor Examination  Sensory  Auditory: intact  Tactile - Light Touch: intact  Visual Acuity: intact    Vision - Complex Assessment  Additional Comments: Pt denies acute visual changes.      Activities of Daily Living  Self-care and Home Management  Eating: Independent  Grooming: Supervision  Bathing: Minimal Assist  UB Dressing: Supervision  LB Dressing: Contact Guard Assist;Increased time to complete  Toileting: Contact Guard Assist (simulated)  Functional Transfers: Stand by Assist;toilet transfer (using  grabbar)    Functional Mobility:  Mobility and Transfers  Scooting to EOB: Supervision  Supine to Sit: Contact Guard Assist;using bedrail;HOB raised  Sit to Supine: Stand by Assist (HOB raised)  Sit to Stand: Supervision  Functional Mobility/Ambulation: Stand by Assist;Contact Guard Assist (SBA with FWW; CGA without AD; min A for O2 line management)     PMP Activity: Step 6 - Walks in Room     Balance  Balance  Static Sitting Balance: good  Dyanamic Sitting Balance: good  Static Standing Balance: good  Dynamic Standing Balance:  (good- mild sway)    Participation and Activity Tolerance  Participation and Endurance  Participation Effort: good  Endurance: Tolerates 10 - 20 min exercise with multiple rests    Patient left with call bell within reach, all needs met, SCDs off as found, fall mat not in place as found, bed alarm on , chair alarm N/A  and all questions answered. RN notified of session outcome and patient response.       Goals:  Time For  Goal Achievement: 5 visits  ADL Goals  Patient will groom self: Supervision, at sinkside  Patient will dress lower body: Supervision  Patient will toilet: Modified Independent  Other Goal: Pt will verbalize / demonstrate good teach back of energy conservation techniques to maximize his tolerance for daily routine at discharge with min cues  Mobility and Transfer Goals  Pt will transfer bed to toilet: Supervision (with good O2 line management, using LRAD)  Pt will perform shower transfer: Stand by Assist (simulating home setup)      PPE worn during session: procedural mask and gloves    Tech present: No    PPE worn by tech: N/A    Margretta Sidle, MS, OTR/L, CSRS   Pager [phone]        Time of treatment:   OT Received On: 04/08/22  Start Time: 1251  Stop Time: 1318  Time Calculation (min): 27 min

## 2022-04-08 NOTE — Plan of Care (Signed)
Pt. Alert and oriented x4. Denies pain. Declined lovenox, other medications given per order. 3L/min of O2.     Problem: Moderate/High Fall Risk Score >5  Goal: Patient will remain free of falls  Outcome: Progressing  Flowsheets (Taken 04/08/2022 0800)  Moderate Risk (6-13):   MOD-Consider activation of bed alarm if appropriate   MOD-Apply bed exit alarm if patient is confused   MOD-Floor mat at bedside (where available) if appropriate   MOD-Remain with patient during toileting   MOD-Place bedside commode and assistive devices out of sight when not in use   MOD-Perform dangle, stand, walk (DSW) prior to mobilization   MOD-Request PT/OT consult order for patients with gait/mobility impairment     Problem: Inadequate Cardiac Output  Goal: Adequate tissue perfusion will be maintained  Outcome: Progressing  Flowsheets (Taken 04/08/2022 1442)  Adequate tissue perfusion will be maintained:   Monitor/assess vital signs   Monitor/assess lab values and report abnormal values   Monitor/assess neurovascular status (pulses, capillary refill, pain, paresthesia, paralysis, presence of edema)   Monitor intake and output   Monitor/assess for signs of VTE (edema of calf/thigh redness, pain)   Monitor for signs and symptoms of a pulmonary embolism (dyspnea, tachypnea, tachycardia, confusion)     Problem: Infection  Goal: Free from infection  Outcome: Progressing  Flowsheets (Taken 04/08/2022 1442)  Free from infection:   Assess for signs/symptoms of infection   Utilize isolation precautions per protocol/policy   Assess immunization status     Problem: Inadequate Gas Exchange  Goal: Adequate oxygenation and improved ventilation  Outcome: Progressing  Flowsheets (Taken 04/08/2022 1442)  Adequate oxygenation and improved ventilation:   Assess lung sounds   Monitor SpO2 and treat as needed   Provide mechanical and oxygen support to facilitate gas exchange   Position for maximum ventilatory efficiency  Goal: Patent Airway maintained  Outcome:  Progressing  Flowsheets (Taken 04/08/2022 1442)  Patent airway maintained:   Position patient for maximum ventilatory efficiency   Provide adequate fluid intake to liquefy secretions     Problem: Compromised skin integrity  Goal: Skin integrity is maintained or improved  Outcome: Progressing  Flowsheets (Taken 04/08/2022 1442)  Skin integrity is maintained or improved:   Assess Braden Scale every shift   Turn or reposition patient every 2 hours or as needed unless able to reposition self   Increase activity as tolerated/progressive mobility   Relieve pressure to bony prominences   Avoid shearing   Keep skin clean and dry   Encourage use of lotion/moisturizer on skin     Problem: Nutrition  Goal: Nutritional intake is adequate  Outcome: Progressing  Flowsheets (Taken 04/08/2022 1442)  Nutritional intake is adequate:   Assist patient with meals/food selection   Allow adequate time for meals   Encourage/perform oral hygiene as appropriate

## 2022-04-09 NOTE — Progress Notes (Addendum)
[phone] / Available 24 hours    Hospice Follow Up Visit:    Date Time: 04/09/22 12:32 PM   Patient Name: [name] [name],[name] [name]   Present at Visit: Spouse and Son       Visit Outcome:   Called and spoke to Mrs. Olejniczak.  She informed me that she passed out in the lobby before Olsen was discharged and that she is now admitted to the hospital.  She thinks she was just exhausted. She asked this Clinical research associate to call her son to schedule a time for the home hospice team to meet with them.     Called patient's son.  He asked that the [facility] home hospice team give him a call in about a week to schedule a hospice informational meeting. He reported that his father is improving and was surprised that the Amoxicillin is the antibiotic which seems to be the most helpful for his father.  He would like hospice information primarily for planning purposes.     Sent an e-mail to the home hospice team with a request to reach out in a week.        Next Steps:   Home team to follow up in about a week to provide information to patient and family.      Signed by: Owens Shark

## 2022-04-09 NOTE — Progress Notes (Signed)
Case Management Progress Note:  SW contacted patient's spouse at bedside. Patient's spouse reported that they would like to receive in home hospice from [facility].      SW contacted [employer]  [phone] and advised will fax referral [phone]        Valente David, MSW  Social Work Case Manager  [phone]

## 2022-04-16 ENCOUNTER — Telehealth: Payer: Self-pay

## 2022-04-16 NOTE — Telephone Encounter (Signed)
Vernona Rieger from [facility] care called asking if Dr. Fernanda Drum will be part of the team.Patient was admitted yesterday     Vernona Rieger can be reached at [phone]

## 2022-04-20 ENCOUNTER — Encounter: Payer: Self-pay | Admitting: Medical Oncology

## 2022-04-22 ENCOUNTER — Encounter: Payer: Self-pay | Admitting: Medical Oncology

## 2022-04-25 ENCOUNTER — Other Ambulatory Visit: Payer: Self-pay | Admitting: Medical

## 2022-04-28 ENCOUNTER — Other Ambulatory Visit: Payer: Self-pay

## 2022-05-12 ENCOUNTER — Encounter: Payer: Self-pay | Admitting: Internal Medicine

## 2022-05-13 ENCOUNTER — Encounter: Payer: Self-pay | Admitting: Medical Oncology

## 2022-06-05 IMAGING — CT CT CHEST W/ CM
2 of 4 series · 14 of 36 positions shown, 17 images · IV contrast (APPLIED)
Comparison: 07/23/2021

CLINICAL DATA: Pleural effusion, mesothelioma

EXAM:
CT CHEST WITH CONTRAST
TECHNIQUE: Multidetector CT imaging of the chest was performed during
intravenous contrast administration.

[Series 2: routine chest with · axial · 0.86mm/px · z∈[-340,-52]mm · 11 of 168 slices shown, 14 images]
[im 12/168  mediastinal]
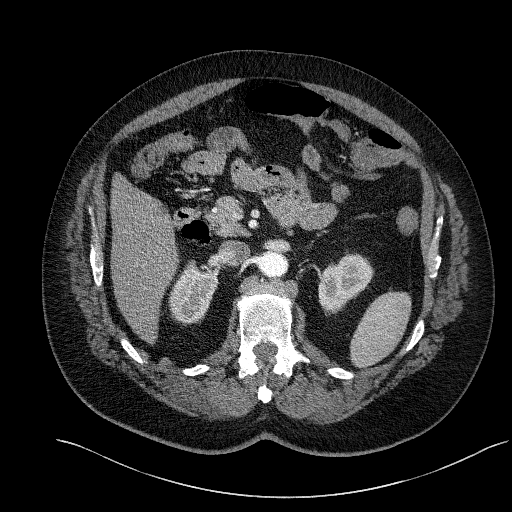
[im 12/168  lung]
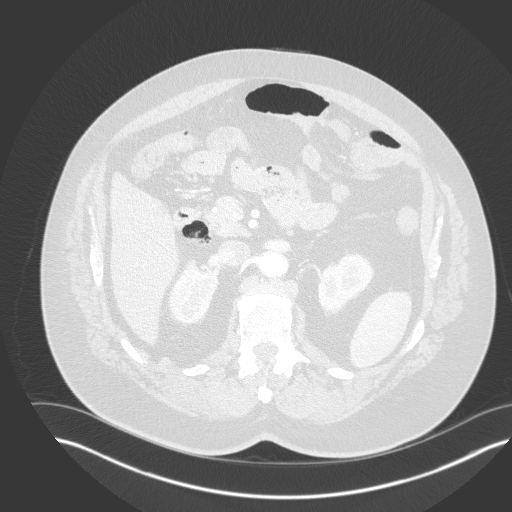
[im 24/168  lung]
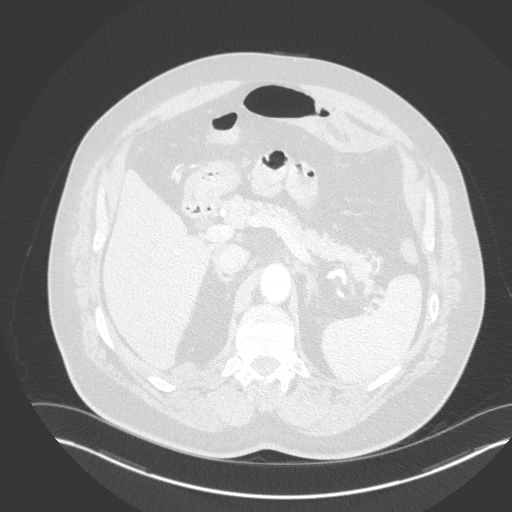
[im 36/168  lung]
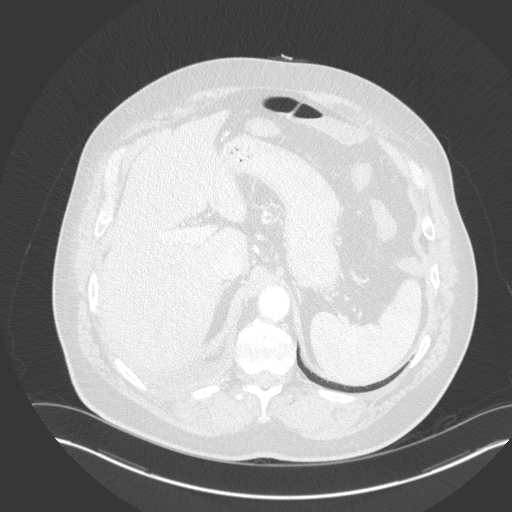
[im 60/168  lung]
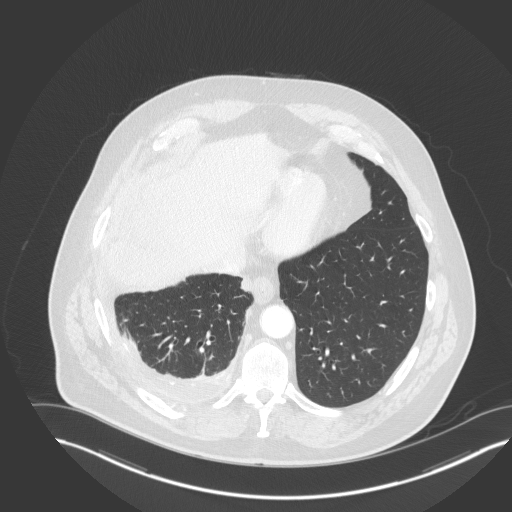
[im 72/168  mediastinal]
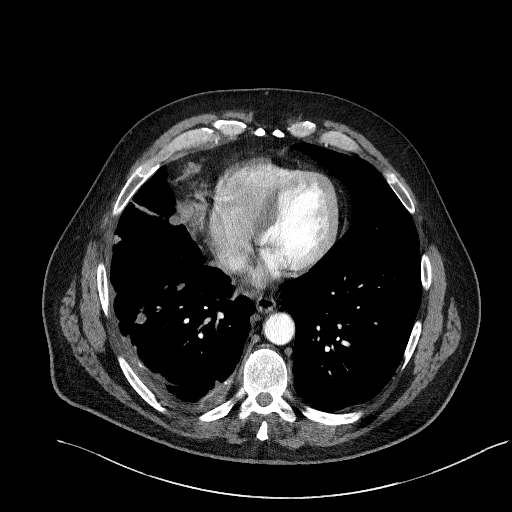
[im 72/168  lung]
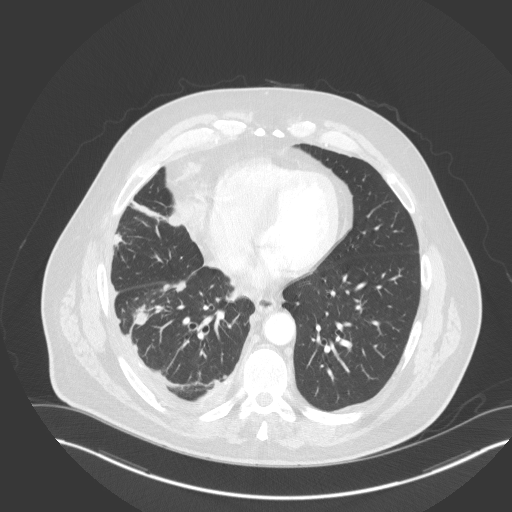
[im 84/168  lung]
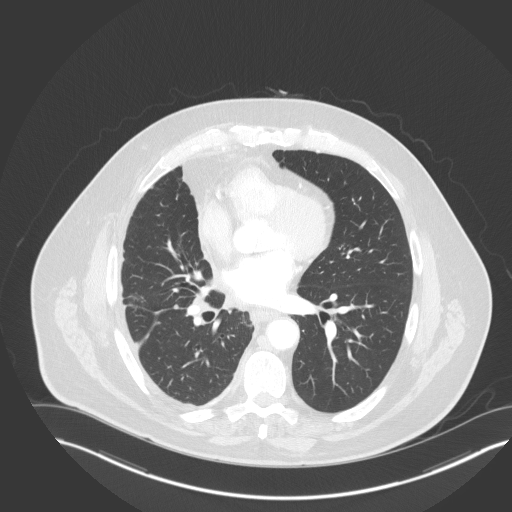
[im 96/168  lung]
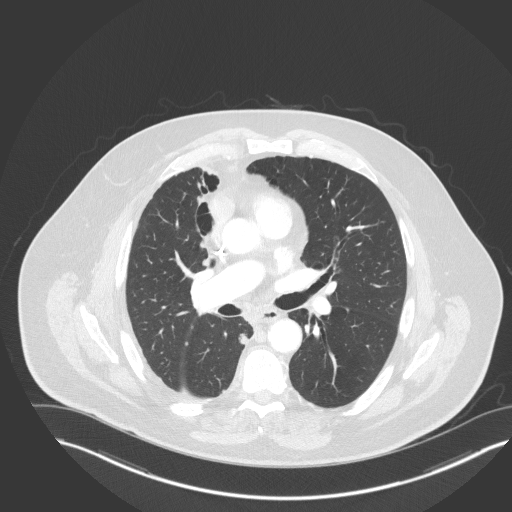
[im 108/168  lung]
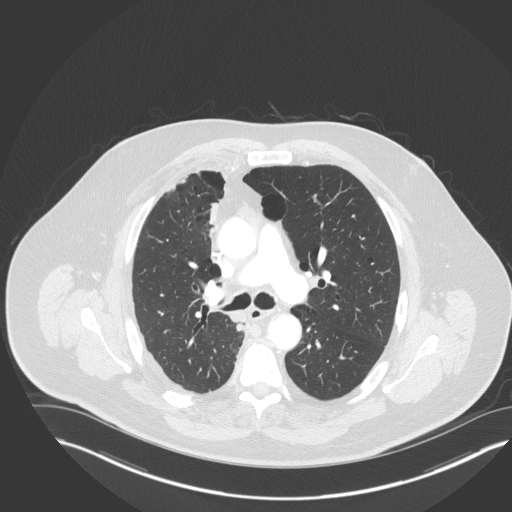
[im 132/168  mediastinal]
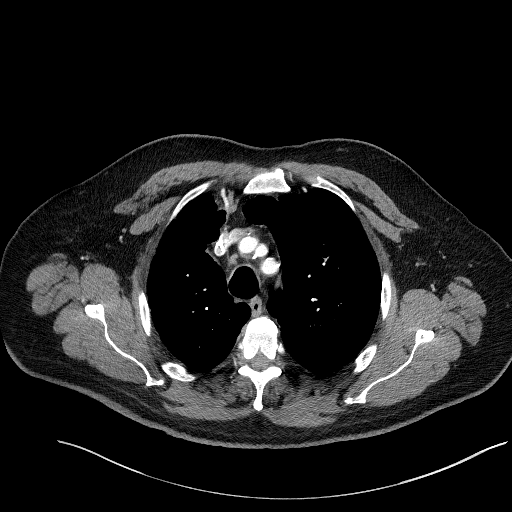
[im 132/168  lung]
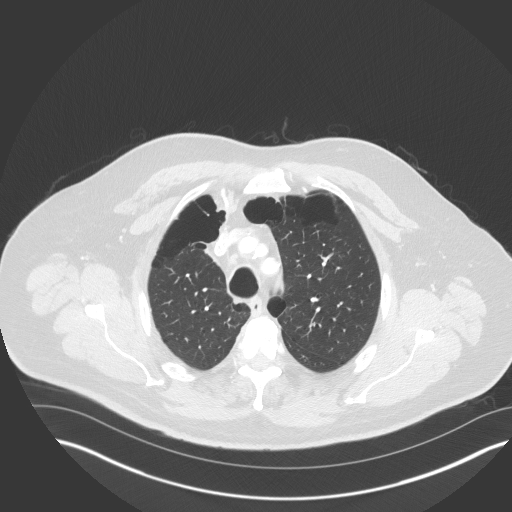
[im 144/168  lung]
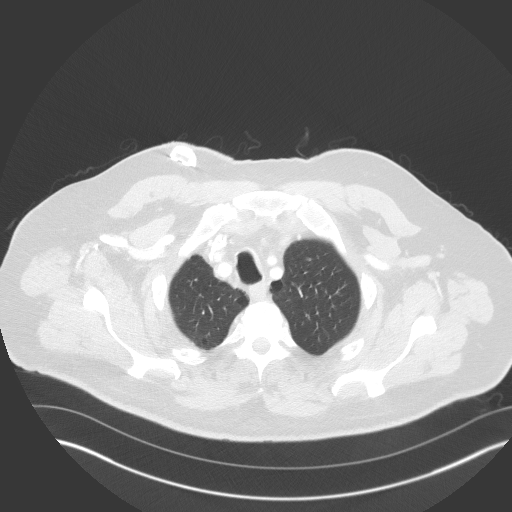
[im 156/168  lung]
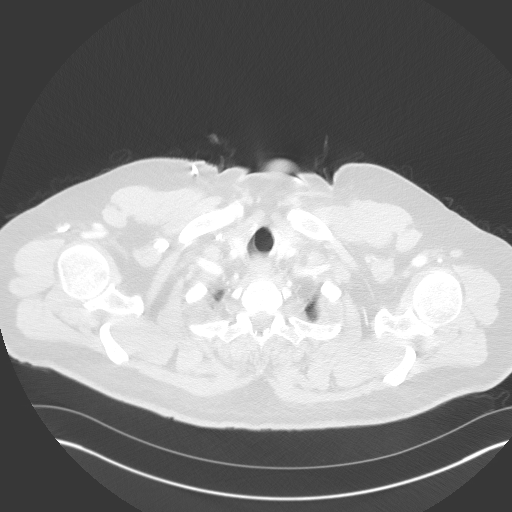

[Series 5: coronal · coronal · 0.67mm/px · 3 of 180 slices shown]
[im 36/180  lung]
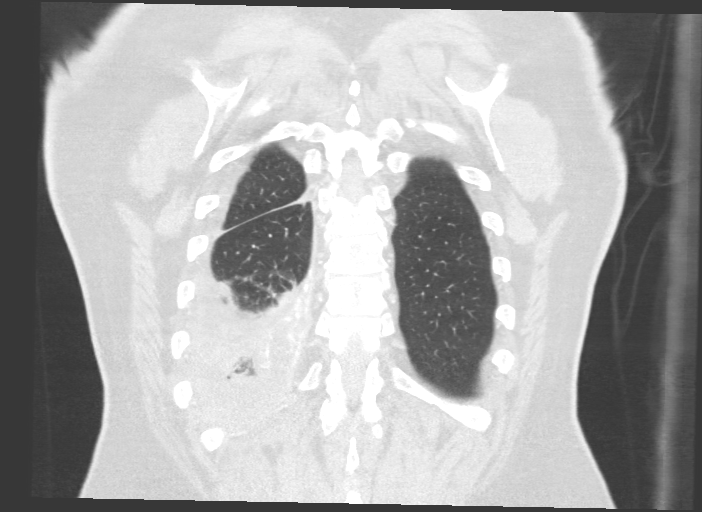
[im 72/180  lung]
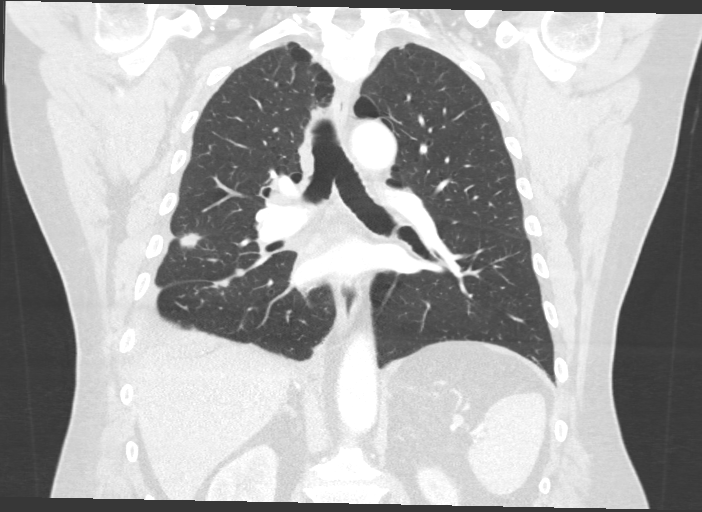
[im 108/180  lung]
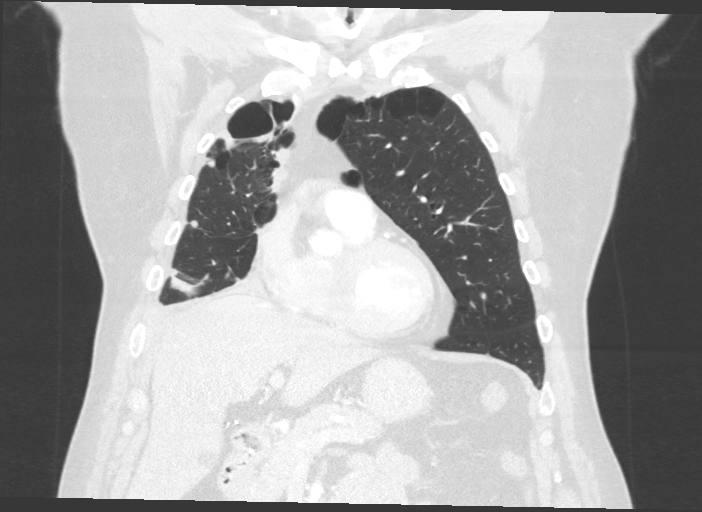

[14 of 36 positions shown; findings below may reference images not displayed]

RADIATION DOSE REDUCTION: This exam was performed according to the
departmental dose-optimization program which includes automated
exposure control, adjustment of the mA and/or kV according to
patient size and/or use of iterative reconstruction technique.

CONTRAST:  75mL OMNIPAQUE IOHEXOL 300 MG/ML  SOLN
FINDINGS: Cardiovascular: Extensive multi-vessel coronary artery
calcification. Global cardiac size within normal limits. No
pericardial effusion. Central pulmonary arteries are mildly enlarged
in keeping with changes of pulmonary arterial hypertension. Mild
atherosclerotic calcification within the thoracic aorta. No aortic
aneurysm. Right internal jugular chest port is in place with its tip
within the right atrium.

Mediastinum/Nodes: Visualized thyroid is unremarkable. No pathologic
thoracic adenopathy. The esophagus is unremarkable. Small hiatal
hernia noted.

Lungs/Pleura: Moderate emphysema again noted. Small complex right
pleural effusion demonstrating pleural calcification is again seen
and is unchanged. Right-sided volume loss is stable. There is
peritoneal studding again identified diffusely in keeping with the
given history of mesothelioma. Since the prior examination,
peritoneal studding has progressed. Index nodule within the a right
paramediastinal pleural surface at the level of the aortic arch,
axial image # 47/2, is stable at 12 mm. However, a paramediastinal
nodule adjacent to the left atrium at axial image # 90/2 measures 16
mm, previously 8 mm. Left lung is clear.

Upper Abdomen: Status post cholecystectomy. Multiple cyst noted
within the visualized liver. No acute abnormality.

Musculoskeletal: No lytic or blastic bone lesion.
IMPRESSION: Interval disease progression with progressive peritoneal studding.
Index nodules as outlined above.

Stable small right pleural effusion and right-sided volume loss.

Moderate emphysema.

Extensive multi-vessel coronary artery calcification

Aortic Atherosclerosis (TZVW7-V4Y.Y) and Emphysema ([id number].O).

## 2022-06-06 DEATH — deceased

## 2022-06-15 ENCOUNTER — Encounter: Payer: Self-pay | Admitting: Internal Medicine

## 2022-09-30 ENCOUNTER — Encounter: Payer: Self-pay | Admitting: Internal Medicine

## 2023-03-01 ENCOUNTER — Telehealth: Payer: Self-pay | Admitting: Medical

## 2023-03-01 NOTE — Telephone Encounter (Signed)
Toni Amend, thanks for letting me know.  I assume this was related to mesothelioma, or do you have any other details?  Lafonda Mosses, please send a sympathy card to his family as he recently passed.

## 2023-03-02 NOTE — Telephone Encounter (Signed)
Card sent
